# Patient Record
Sex: Male | Born: 1955 | Race: White | Hispanic: No | Smoking: Never smoker
Health system: Southern US, Community
[De-identification: ages and names within clinical notes are randomized; demographics above are authoritative.]

## PROBLEM LIST (undated history)

## (undated) DIAGNOSIS — E785 Hyperlipidemia, unspecified: Secondary | ICD-10-CM

## (undated) DIAGNOSIS — T8859XA Other complications of anesthesia, initial encounter: Secondary | ICD-10-CM

## (undated) DIAGNOSIS — I10 Essential (primary) hypertension: Secondary | ICD-10-CM

## (undated) DIAGNOSIS — N200 Calculus of kidney: Secondary | ICD-10-CM

## (undated) DIAGNOSIS — F32A Depression, unspecified: Secondary | ICD-10-CM

## (undated) DIAGNOSIS — I5189 Other ill-defined heart diseases: Secondary | ICD-10-CM

## (undated) DIAGNOSIS — Z9289 Personal history of other medical treatment: Secondary | ICD-10-CM

## (undated) DIAGNOSIS — Z9981 Dependence on supplemental oxygen: Secondary | ICD-10-CM

## (undated) DIAGNOSIS — C4491 Basal cell carcinoma of skin, unspecified: Secondary | ICD-10-CM

## (undated) DIAGNOSIS — G4733 Obstructive sleep apnea (adult) (pediatric): Secondary | ICD-10-CM

## (undated) DIAGNOSIS — R0789 Other chest pain: Secondary | ICD-10-CM

## (undated) DIAGNOSIS — Z87442 Personal history of urinary calculi: Secondary | ICD-10-CM

## (undated) DIAGNOSIS — F329 Major depressive disorder, single episode, unspecified: Secondary | ICD-10-CM

## (undated) DIAGNOSIS — T4145XA Adverse effect of unspecified anesthetic, initial encounter: Secondary | ICD-10-CM

## (undated) DIAGNOSIS — L0390 Cellulitis, unspecified: Secondary | ICD-10-CM

## (undated) DIAGNOSIS — Z86010 Personal history of colonic polyps: Secondary | ICD-10-CM

## (undated) DIAGNOSIS — R7303 Prediabetes: Secondary | ICD-10-CM

## (undated) HISTORY — DX: Depression, unspecified: F32.A

## (undated) HISTORY — DX: Hyperlipidemia, unspecified: E78.5

## (undated) HISTORY — DX: Other ill-defined heart diseases: I51.89

## (undated) HISTORY — DX: Personal history of colonic polyps: Z86.010

## (undated) HISTORY — PX: LAPAROSCOPY: SHX197

## (undated) HISTORY — DX: Essential (primary) hypertension: I10

## (undated) HISTORY — DX: Obstructive sleep apnea (adult) (pediatric): G47.33

## (undated) HISTORY — DX: Major depressive disorder, single episode, unspecified: F32.9

## (undated) HISTORY — DX: Morbid (severe) obesity due to excess calories: E66.01

## (undated) HISTORY — DX: Other chest pain: R07.89

## (undated) HISTORY — DX: Basal cell carcinoma of skin, unspecified: C44.91

## (undated) HISTORY — PX: KNEE ARTHROSCOPY: SUR90

## (undated) HISTORY — PX: COLONOSCOPY: SHX174

## (undated) HISTORY — PX: ESOPHAGOGASTRODUODENOSCOPY: SHX1529

---
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
## 1991-08-02 HISTORY — PX: CHOLECYSTECTOMY: SHX55

## 1998-08-01 HISTORY — PX: LITHOTRIPSY: SUR834

## 2001-08-13 ENCOUNTER — Encounter: Payer: Self-pay | Admitting: Pulmonary Disease

## 2004-05-14 ENCOUNTER — Ambulatory Visit: Payer: Self-pay

## 2004-05-17 ENCOUNTER — Ambulatory Visit: Payer: Self-pay

## 2004-06-14 ENCOUNTER — Other Ambulatory Visit: Payer: Self-pay

## 2004-06-21 ENCOUNTER — Ambulatory Visit: Payer: Self-pay | Admitting: General Practice

## 2004-07-09 ENCOUNTER — Encounter: Payer: Self-pay | Admitting: General Practice

## 2004-07-15 ENCOUNTER — Ambulatory Visit: Payer: Self-pay

## 2005-01-12 ENCOUNTER — Ambulatory Visit: Payer: Self-pay | Admitting: Psychiatry

## 2006-05-22 ENCOUNTER — Ambulatory Visit: Payer: Self-pay | Admitting: Family Medicine

## 2006-06-07 ENCOUNTER — Emergency Department: Payer: Self-pay | Admitting: Internal Medicine

## 2006-08-10 ENCOUNTER — Ambulatory Visit: Payer: Self-pay | Admitting: Family Medicine

## 2006-08-10 LAB — CONVERTED CEMR LAB
Albumin: 3.4 g/dL — ABNORMAL LOW (ref 3.5–5.2)
BUN: 11 mg/dL (ref 6–23)
CO2: 28 meq/L (ref 19–32)
Chol/HDL Ratio, serum: 4.6
Creatinine, Ser: 1.2 mg/dL (ref 0.4–1.5)
GFR calc non Af Amer: 68 mL/min
Glucose, Bld: 106 mg/dL — ABNORMAL HIGH (ref 70–99)
HDL: 46 mg/dL (ref >39.0)
Total Bilirubin: 0.6 mg/dL (ref 0.3–1.2)
Triglyceride fasting, serum: 130 mg/dL (ref 0–149)
VLDL: 26 mg/dL (ref 0–40)

## 2007-05-19 ENCOUNTER — Encounter: Payer: Self-pay | Admitting: Family Medicine

## 2007-06-12 ENCOUNTER — Telehealth: Payer: Self-pay | Admitting: Family Medicine

## 2007-06-20 ENCOUNTER — Telehealth: Payer: Self-pay | Admitting: Family Medicine

## 2007-06-21 ENCOUNTER — Encounter: Payer: Self-pay | Admitting: Family Medicine

## 2007-06-21 DIAGNOSIS — E1169 Type 2 diabetes mellitus with other specified complication: Secondary | ICD-10-CM | POA: Insufficient documentation

## 2007-06-21 DIAGNOSIS — E66813 Obesity, class 3: Secondary | ICD-10-CM | POA: Insufficient documentation

## 2007-06-21 DIAGNOSIS — F332 Major depressive disorder, recurrent severe without psychotic features: Secondary | ICD-10-CM | POA: Insufficient documentation

## 2007-06-21 DIAGNOSIS — E8881 Metabolic syndrome: Secondary | ICD-10-CM | POA: Insufficient documentation

## 2007-06-21 DIAGNOSIS — Z6841 Body Mass Index (BMI) 40.0 and over, adult: Secondary | ICD-10-CM | POA: Insufficient documentation

## 2007-06-21 DIAGNOSIS — E785 Hyperlipidemia, unspecified: Secondary | ICD-10-CM | POA: Insufficient documentation

## 2007-07-23 ENCOUNTER — Telehealth: Payer: Self-pay | Admitting: Family Medicine

## 2007-08-07 ENCOUNTER — Telehealth (INDEPENDENT_AMBULATORY_CARE_PROVIDER_SITE_OTHER): Payer: Self-pay | Admitting: *Deleted

## 2007-08-30 ENCOUNTER — Ambulatory Visit: Payer: Self-pay | Admitting: Family Medicine

## 2007-09-03 ENCOUNTER — Ambulatory Visit: Payer: Self-pay | Admitting: Internal Medicine

## 2007-09-10 ENCOUNTER — Ambulatory Visit: Payer: Self-pay | Admitting: Family Medicine

## 2007-09-12 ENCOUNTER — Encounter: Payer: Self-pay | Admitting: Family Medicine

## 2007-09-12 ENCOUNTER — Ambulatory Visit: Payer: Self-pay | Admitting: Internal Medicine

## 2007-09-12 ENCOUNTER — Encounter: Payer: Self-pay | Admitting: Internal Medicine

## 2007-09-13 ENCOUNTER — Encounter: Payer: Self-pay | Admitting: Family Medicine

## 2007-09-13 LAB — CONVERTED CEMR LAB
Albumin: 3.5 g/dL (ref 3.5–5.2)
CO2: 29 meq/L (ref 19–32)
Cholesterol: 218 mg/dL (ref 0–200)
Creatinine, Ser: 1.2 mg/dL (ref 0.4–1.5)
Direct LDL: 153.9 mg/dL
HDL: 45.6 mg/dL (ref >39.0)
PSA: 2.8 ng/mL (ref 0.10–4.00)
Potassium: 3.7 meq/L (ref 3.5–5.1)
Sodium: 140 meq/L (ref 135–145)
Total Bilirubin: 0.7 mg/dL (ref 0.3–1.2)
Total Protein: 7 g/dL (ref 6.0–8.3)
Triglycerides: 99 mg/dL (ref 0–149)
VLDL: 20 mg/dL (ref 0–40)

## 2008-01-14 ENCOUNTER — Telehealth: Payer: Self-pay | Admitting: Family Medicine

## 2008-02-11 ENCOUNTER — Ambulatory Visit: Payer: Self-pay | Admitting: Family Medicine

## 2008-02-11 ENCOUNTER — Telehealth: Payer: Self-pay | Admitting: Family Medicine

## 2008-02-14 LAB — CONVERTED CEMR LAB
ALT: 17 units/L (ref 0–53)
AST: 17 units/L (ref 0–37)

## 2008-03-10 ENCOUNTER — Telehealth: Payer: Self-pay | Admitting: Family Medicine

## 2008-04-10 ENCOUNTER — Telehealth: Payer: Self-pay | Admitting: Family Medicine

## 2008-04-14 ENCOUNTER — Encounter: Payer: Self-pay | Admitting: Family Medicine

## 2008-04-23 ENCOUNTER — Telehealth: Payer: Self-pay | Admitting: Family Medicine

## 2008-08-15 ENCOUNTER — Encounter (INDEPENDENT_AMBULATORY_CARE_PROVIDER_SITE_OTHER): Payer: Self-pay | Admitting: *Deleted

## 2008-09-22 ENCOUNTER — Telehealth: Payer: Self-pay | Admitting: Family Medicine

## 2009-01-08 ENCOUNTER — Telehealth: Payer: Self-pay | Admitting: Family Medicine

## 2009-02-04 ENCOUNTER — Telehealth: Payer: Self-pay | Admitting: Family Medicine

## 2009-03-17 ENCOUNTER — Ambulatory Visit: Payer: Self-pay | Admitting: Family Medicine

## 2009-03-18 LAB — CONVERTED CEMR LAB
ALT: 19 units/L (ref 0–53)
AST: 17 units/L (ref 0–37)
Bilirubin, Direct: 0.1 mg/dL (ref 0.0–0.3)
Chloride: 107 meq/L (ref 96–112)
LDL Cholesterol: 99 mg/dL (ref 0–99)
Potassium: 3.8 meq/L (ref 3.5–5.1)
Total Bilirubin: 0.8 mg/dL (ref 0.3–1.2)
Total CHOL/HDL Ratio: 3
Triglycerides: 80 mg/dL (ref 0.0–149.0)

## 2009-03-24 ENCOUNTER — Ambulatory Visit: Payer: Self-pay | Admitting: Family Medicine

## 2009-03-24 LAB — CONVERTED CEMR LAB
Cholesterol, target level: 200 mg/dL
LDL Goal: 130 mg/dL

## 2009-03-26 ENCOUNTER — Telehealth: Payer: Self-pay | Admitting: Family Medicine

## 2009-05-01 ENCOUNTER — Ambulatory Visit: Payer: Self-pay | Admitting: Family Medicine

## 2009-05-01 DIAGNOSIS — G4730 Sleep apnea, unspecified: Secondary | ICD-10-CM

## 2009-05-12 ENCOUNTER — Ambulatory Visit: Payer: Self-pay | Admitting: Pulmonary Disease

## 2009-05-12 DIAGNOSIS — G4733 Obstructive sleep apnea (adult) (pediatric): Secondary | ICD-10-CM | POA: Insufficient documentation

## 2009-05-28 ENCOUNTER — Telehealth: Payer: Self-pay | Admitting: Pulmonary Disease

## 2009-07-07 ENCOUNTER — Ambulatory Visit: Payer: Self-pay | Admitting: Pulmonary Disease

## 2009-08-08 ENCOUNTER — Encounter: Payer: Self-pay | Admitting: Pulmonary Disease

## 2009-09-17 ENCOUNTER — Telehealth: Payer: Self-pay | Admitting: Family Medicine

## 2009-12-30 ENCOUNTER — Encounter: Payer: Self-pay | Admitting: Family Medicine

## 2010-01-06 ENCOUNTER — Telehealth: Payer: Self-pay | Admitting: Pulmonary Disease

## 2010-01-19 ENCOUNTER — Telehealth: Payer: Self-pay | Admitting: Pulmonary Disease

## 2010-01-23 ENCOUNTER — Encounter: Payer: Self-pay | Admitting: Pulmonary Disease

## 2010-01-27 ENCOUNTER — Telehealth: Payer: Self-pay | Admitting: Pulmonary Disease

## 2010-06-08 ENCOUNTER — Telehealth: Payer: Self-pay | Admitting: Family Medicine

## 2010-06-15 ENCOUNTER — Encounter: Payer: Self-pay | Admitting: Family Medicine

## 2010-09-02 NOTE — Progress Notes (Signed)
Summary: nos appt  Phone Note Call from Patient   Caller: [email or web address]  Call For: Blake Garcia Summary of Call: Rsc nos from 6/7 to 6/20 @ 9:45a. Initial call taken by: Darletta Moll,  January 06, 2010 9:45 AM

## 2010-09-02 NOTE — Progress Notes (Signed)
Summary: prior auth needed for diovan  Phone Note From Pharmacy   Caller: [id number] Summary of Call: Prior Berkley Harvey is needed for diovan, form is on your desk. Initial call taken by: Lowella Petties CMA, AAMA,  June 08, 2010 2:46 PM     Appended Document: prior auth needed for diovan Prior auth given for diovan, approval letter placed on doctor's desk for signature and scanning.

## 2010-09-02 NOTE — Letter (Signed)
Summary: CMN for CPAP Supplies/[facility]  CMN for CPAP Supplies/[facility]   Imported By: Sherian Rein 01/27/2010 15:45:17  _____________________________________________________________________  External Attachment:    Type:   Image     Comment:   External Document

## 2010-09-02 NOTE — Progress Notes (Signed)
Summary: nos appt  Phone Note Call from Patient   Caller: [email or web address]  Call For: Saory Carriero Summary of Call: LMTCB x2 to rsc nos from 6/28. Initial call taken by: Darletta Moll,  January 27, 2010 3:57 PM

## 2010-09-02 NOTE — Medication Information (Signed)
Summary: Diovan Approved  Diovan Approved   Imported By: Maryln Gottron 06/25/2010 15:22:26  _____________________________________________________________________  External Attachment:    Type:   Image     Comment:   External Document

## 2010-09-02 NOTE — Progress Notes (Signed)
Summary: refill  Phone Note Refill Request Message from:  [employer] on September 17, 2009 8:56 AM  Refills Requested: Medication #1:  bupropion   Supply Requested: 1 year not on patients medication list   Method Requested: Electronic Initial call taken by: Benny Lennert CMA Duncan Dull),  September 17, 2009 8:56 AM  Follow-up for Phone Call        Denied. Pt no longer on. As of 05/2009 depression well controlled with venlafaxine.  Follow-up by: Kerby Nora MD,  September 17, 2009 10:58 AM

## 2010-09-02 NOTE — Miscellaneous (Signed)
Summary: auto download shows optimal pressure to be 13cm.  Clinical Lists Changes  Orders: Added new Referral order of DME Referral (DME) - Signed auto download shows optimal pressure 13cm.  compliance is about 50% greater or equal to 4 hours will get machine set to 13cm

## 2010-09-02 NOTE — Letter (Signed)
Summary: Letter Regarding Depression Program/[facility]  Letter Regarding Depression Program/[facility]   Imported By: Lanelle Bal 01/12/2010 09:55:55  _____________________________________________________________________  External Attachment:    Type:   Image     Comment:   External Document

## 2010-09-02 NOTE — Progress Notes (Signed)
Summary: nos appt  Phone Note Call from Patient   Caller: [email or web address]  Call For: Shayde Gervacio Summary of Call: Rsc nos from 6/20 to 6/28 @ 9a. Initial call taken by: Darletta Moll,  January 19, 2010 3:00 PM

## 2010-12-17 NOTE — Assessment & Plan Note (Signed)
[facility]                             [facility] OFFICE NOTE   Blake Garcia, Blake Garcia                      MRN:          [id number]  DATE:05/22/2006                            DOB:          Dec 18, 1955    CHIEF COMPLAINT:  A 55 year old white male here to establish new doctor.   HISTORY OF PRESENT ILLNESS:  Blake Garcia was divorced from his previous  wife earlier this year.  He then moved back home to [place] from  [place].  Previously he was seeing a primary care doctor in [place].   1. He comes to the clinic today to be set up with a physician and to      discuss his elevated blood pressure.  He states he ran out of      hydrochlorothiazide and Diovan for the past 2 months.  He had been,      instead, taking his 2 other medications, verapamil 240 mg daily and      lisinopril 10 mg daily.  His blood pressure had been under pretty good      control around 128/88 when he measures it at work.  Prior to running      out of hydrochlorothiazide and Diovan, those were the 2 medications he      was really taking.  He was previously on 4 medications, but lost 22      pounds, and so his blood pressure got under better control with just 2      medications, but he never made it back to his primary care doctor to      let them know, but instead stopped the other 2 medications on his own.   1. Depression:  This manifested itself since the divorce with irritability      and snapping verbally at his children.  He has been seeing a      psychiatrist, Dr. Manson Passey, in [place].  This doctor began him on      Wellbutrin 200 mg b.i.d.  He states he just takes 1 tablet in the      morning, but this does very well for him and controls his mood      throughout the day, and by the time it is evening, he has less stress      and less irritability, and therefore, does not take the second dose of      the day.   PAST MEDICAL HISTORY:  1. Hypertension.  2.  Depression.   HOSPITALIZATIONS, SURGERIES, PROCEDURES:  1. 1993 cholecystectomy, open.  2. 2000 kidney stone lithotripsy and stents.  3. 1993 EGD, negative.  4. 1993 colonoscopy, negative.  5. 2005 arthroscopy for torn meniscus.   ALLERGIES:  None.   MEDICATIONS:  1. Verapamil 240 mg p.o. daily.  2. Wellbutrin 200 mg daily.  3. Klor-Con 20 mg p.o. b.i.d.  4. Lisinopril 10 mg daily.  5. Hydrochlorothiazide 25 mg daily.  6. Diovan 160/12.5 mg daily.  7. Viagra 50 mg p.o. q. p.r.n.   FAMILY HISTORY:  Father deceased at age 52 with CABG and massive stroke  during valve repair surgery.  Mother alive at age 56 with hypertension and  pacemaker.  He has 2 brothers, 1 with coronary artery disease resulting in  CABG and stent.  They both have back problems.  One of his brothers does  have diabetes.  He has 2 maternal aunts with breast cancer.  He has 1  maternal uncle with lung cancer.   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  He is divorced  recently within the last year.  He has 3 children age ranged 5 to 63 whom he  sees every other weekend.  He walks about 1 time per week.  He eats about 2  meals per day and frequently skips lunch or dinner.  He usually eats cereal  and salads.  He does drink a lot of soda, Diet Cheerwine.   REVIEW OF SYSTEMS:  No headache.  He wears glasses.  No hearing problems.  No dyspnea.  No chest pain.  No palpitations.  No nausea, vomiting,  diarrhea, constipation, or rectal bleeding.   PHYSICAL EXAM:  VITAL SIGNS:  Height 69 inches.  Weight 318, making BMI  above 40.  Blood pressure 142/104, pulse 80, temperature 98.5.  GENERAL:  Morbidly obese male in no apparent distress.  HEENT:  PERRLA.  Extraocular muscles are intact.  Oropharynx clear.  Nares  clear.  Tympanic membranes clear.  No thyromegaly.  No lymphadenopathy  supraclavicular or cervical.  CARDIOVASCULAR:  Regular rate and rhythm.  No murmurs, rubs, or gallops.  Normal PMI, 2+  peripheral pulses.  No peripheral edema.  LUNGS:  Clear to auscultation bilaterally.  No wheeze, rales, or rhonchi.  ABDOMEN:  Soft and nontender.  Normoactive bowel sounds.  No  hepatosplenomegaly.  MUSCULOSKELETAL:  5/5 strength.  NEURO:  Alert and oriented x3.  Cranial nerves 2-12 are intact.  Reflexes,  patellar, within normal limits.   ASSESSMENT AND PLAN:  1. Hypertension, moderate control:  Mr. Fadden states that he rushed      here today and had a stressful event regarding his children earlier      this morning, and he feels that is why his blood pressure is higher      than it normally is.  We discussed different medications and I do not      think more than 25 mg of hydrochlorothiazide really is very effective.      We also discussed making his medications more affordable.  We will      change him to hydrochlorothiazide 25 mg daily and lisinopril 10 mg      daily.  He was given prescription for the hydrochlorothiazide 6      refills.  He will monitor his blood pressure at home and let me know if      it is above the goal 140/90.  He will likely also need a basic      metabolic profile once I obtain his records from his previous doctor.      We discussed low-salt diet and increasing exercise, which he will work      on, as well as losing weight.  2. Prevention:  He is overdue for a cholesterol screen.  We should likely      re-screen him for      diabetes given his family history and his morbid obesity.  He is up to      date with his tetanus shot, but could receive the flu vaccine, which we  will discuss at the next visit.      Kerby Nora, MD    AB/MedQ  DD:  05/22/2006  DT:  05/22/2006  Job #:  [id number]

## 2011-02-03 ENCOUNTER — Other Ambulatory Visit: Payer: Self-pay | Admitting: Family Medicine

## 2011-02-03 NOTE — Telephone Encounter (Signed)
Patient has not had cpx since 2010

## 2011-02-04 NOTE — Telephone Encounter (Signed)
I refilled once but please have pt make appt to be seen prior to any further refills.

## 2011-02-04 NOTE — Telephone Encounter (Signed)
Patient advised via message on machine that he will need appt for physical before 90 day refill finished b/c he can't have further refills with out appt

## 2011-02-15 ENCOUNTER — Inpatient Hospital Stay: Payer: Self-pay | Admitting: Internal Medicine

## 2011-02-21 ENCOUNTER — Other Ambulatory Visit (INDEPENDENT_AMBULATORY_CARE_PROVIDER_SITE_OTHER): Payer: BC Managed Care – PPO | Admitting: Family Medicine

## 2011-02-21 ENCOUNTER — Telehealth: Payer: Self-pay | Admitting: Family Medicine

## 2011-02-21 DIAGNOSIS — R7309 Other abnormal glucose: Secondary | ICD-10-CM

## 2011-02-21 DIAGNOSIS — E785 Hyperlipidemia, unspecified: Secondary | ICD-10-CM

## 2011-02-21 DIAGNOSIS — Z125 Encounter for screening for malignant neoplasm of prostate: Secondary | ICD-10-CM

## 2011-02-21 DIAGNOSIS — I10 Essential (primary) hypertension: Secondary | ICD-10-CM

## 2011-02-21 LAB — LIPID PANEL
Cholesterol: 153 mg/dL (ref 0–200)
HDL: 49.9 mg/dL (ref >39.00)
LDL Cholesterol: 79 mg/dL (ref 0–99)
VLDL: 24 mg/dL (ref 0.0–40.0)

## 2011-02-21 LAB — COMPREHENSIVE METABOLIC PANEL
Alkaline Phosphatase: 75 U/L (ref 39–117)
Creatinine, Ser: 1 mg/dL (ref 0.4–1.5)
Glucose, Bld: 105 mg/dL — ABNORMAL HIGH (ref 70–99)
Sodium: 136 mEq/L (ref 135–145)
Total Bilirubin: 0.4 mg/dL (ref 0.3–1.2)
Total Protein: 6.8 g/dL (ref 6.0–8.3)

## 2011-02-21 NOTE — Telephone Encounter (Signed)
Message copied by Excell Seltzer on Mon Feb 21, 2011  9:51 AM ------      Message from: Alvina Chou      Created: Mon Feb 21, 2011  8:32 AM       Patient is scheduled for CPX labs today, please order future labs, Thanks , Blake Garcia

## 2011-02-22 ENCOUNTER — Encounter: Payer: Self-pay | Admitting: Family Medicine

## 2011-02-24 ENCOUNTER — Ambulatory Visit (INDEPENDENT_AMBULATORY_CARE_PROVIDER_SITE_OTHER): Payer: BC Managed Care – PPO | Admitting: Family Medicine

## 2011-02-24 ENCOUNTER — Encounter: Payer: Self-pay | Admitting: Family Medicine

## 2011-02-24 DIAGNOSIS — R7309 Other abnormal glucose: Secondary | ICD-10-CM

## 2011-02-24 DIAGNOSIS — E785 Hyperlipidemia, unspecified: Secondary | ICD-10-CM

## 2011-02-24 DIAGNOSIS — F329 Major depressive disorder, single episode, unspecified: Secondary | ICD-10-CM

## 2011-02-24 DIAGNOSIS — A630 Anogenital (venereal) warts: Secondary | ICD-10-CM | POA: Insufficient documentation

## 2011-02-24 DIAGNOSIS — R972 Elevated prostate specific antigen [PSA]: Secondary | ICD-10-CM | POA: Insufficient documentation

## 2011-02-24 DIAGNOSIS — I10 Essential (primary) hypertension: Secondary | ICD-10-CM

## 2011-02-24 DIAGNOSIS — K921 Melena: Secondary | ICD-10-CM | POA: Insufficient documentation

## 2011-02-24 LAB — TSH: TSH: 2.03 u[IU]/mL (ref 0.35–5.50)

## 2011-02-24 MED ORDER — AMLODIPINE BESYLATE 5 MG PO TABS: 5.0000 mg | ORAL_TABLET | Freq: Every day | ORAL | Status: DC

## 2011-02-24 MED ORDER — VALSARTAN-HYDROCHLOROTHIAZIDE 320-25 MG PO TABS: 1.0000 | ORAL_TABLET | Freq: Every day | ORAL | Status: DC

## 2011-02-24 MED ORDER — POTASSIUM CHLORIDE CRYS ER 20 MEQ PO TBCR: 20.0000 meq | EXTENDED_RELEASE_TABLET | Freq: Every day | ORAL | Status: DC

## 2011-02-24 MED ORDER — SIMVASTATIN 40 MG PO TABS: 40.0000 mg | ORAL_TABLET | Freq: Every day | ORAL | Status: DC

## 2011-02-24 MED ORDER — VENLAFAXINE HCL ER 75 MG PO CP24: 75.0000 mg | ORAL_CAPSULE | Freq: Every day | ORAL | Status: DC

## 2011-02-24 NOTE — Assessment & Plan Note (Signed)
Slight improvement  In glucose. Encouraged exercise, weight loss, healthy eating habits.

## 2011-02-24 NOTE — Progress Notes (Signed)
  Subjective:    Patient ID: Blake Garcia, male    DOB: [birth year], 55 y.o.   MRN: [id number]  HPI  The patient is here for annual wellness exam and preventative care.    Hypertension:   moderate control on diovan, elevated in hospital so amlodipine added. Using medication without problems or lightheadedness:  Chest pain with exertion: none Edema:None Short of breath:None Average home BPs: Since hosp D/C 140/88 Other issues:  Elevated Cholesterol: On simvastatin, well controlled Using medications without problems:None Muscle aches: None Other complaints:  He was also admitted to [facility] on 7/17 to 7/20 for severe headache, fever, tachy,  Nml head CT, unable to get spinal fluid for lumbar puncture Blood cultures negative. Dx with UTI. RMSF and LYME test neg.  Started on IV fluids, vancomycin, meropenem, IV dexamethasone.  Continued on oral Levaquin for 5 days. Last day yesterday. Dx ith systemic inflammatory respone  with UTI, orossible meningitis per Neuro consult  Since at home.. Feeling much better, no further fever, no headache, does have mild neck pain. No dysuria or blood in urine.  He has continued to have weight gain.. Now is over 400lbs. Minimal exercise. Poor diet but ready to work on this.   Review of Systems Does not some urinary urgency. Has occ noted coffee ground color of stool.    Objective:   Physical Exam        Assessment & Plan:  Complete Physical Exam: .aeb

## 2011-02-24 NOTE — Assessment & Plan Note (Signed)
Possible father with prostate issues not sure if cancer.  Will recheck PSA to verify with free and total. May need uro referral.

## 2011-02-24 NOTE — Assessment & Plan Note (Addendum)
Hemeoccult today: Negative. Not sure if true melena.  Sent home with stool cards to check stool for blood. No epigastric pain, no GERD.

## 2011-02-24 NOTE — Patient Instructions (Addendum)
Continue to follow BP if remaining equal to or above 140/90 or less than 90/60 for the next 2 weeks... Call the office.  We will call with PSA results.  Please return stool cards to lab.  Start regualr exercise. Eat three meals a day, don't skip meals.  Stop fast food.  Increase water in diet.

## 2011-02-24 NOTE — Assessment & Plan Note (Signed)
Well controlled on effexor  

## 2011-02-24 NOTE — Assessment & Plan Note (Signed)
Borderline control, if BPs remain high we will need to increase amlodipine.

## 2011-02-25 ENCOUNTER — Encounter: Payer: Self-pay | Admitting: Family Medicine

## 2011-02-25 LAB — PSA, TOTAL AND FREE
PSA, Free: <0.01 ng/mL
PSA: 4.19 ng/mL — ABNORMAL HIGH (ref <=4.00)

## 2011-02-26 ENCOUNTER — Other Ambulatory Visit: Payer: Self-pay | Admitting: Family Medicine

## 2011-04-01 ENCOUNTER — Ambulatory Visit: Payer: BC Managed Care – PPO | Admitting: Family Medicine

## 2011-04-14 ENCOUNTER — Telehealth: Payer: Self-pay | Admitting: *Deleted

## 2011-04-14 NOTE — Telephone Encounter (Signed)
Pt is coming in tomorrow to have cryotherapy on warts and he is asking if he should stop his asa prior, to avoid bleeding.  Left message on voice mail advising pt that there's no need for him to stop his aspirin.

## 2011-04-15 ENCOUNTER — Ambulatory Visit (INDEPENDENT_AMBULATORY_CARE_PROVIDER_SITE_OTHER): Payer: BC Managed Care – PPO | Admitting: Family Medicine

## 2011-04-15 ENCOUNTER — Encounter: Payer: Self-pay | Admitting: Family Medicine

## 2011-04-15 DIAGNOSIS — A630 Anogenital (venereal) warts: Secondary | ICD-10-CM

## 2011-04-15 DIAGNOSIS — R972 Elevated prostate specific antigen [PSA]: Secondary | ICD-10-CM

## 2011-04-15 DIAGNOSIS — I10 Essential (primary) hypertension: Secondary | ICD-10-CM

## 2011-04-15 NOTE — Progress Notes (Signed)
  Subjective:    Patient ID: Blake Garcia, male    DOB: September 26, 1955, 55 y.o.   MRN: [id number]  HPI  55 year old male presents for cyotherapy of genital warts.  HTN, improved control since last check, running well at home.  Continuing to work on weight loss, has gained one pound. He has not started walking yet, has cut out fries, small meals each day.  ? Melena: no further cahnge in stool color. He has not returned stool cards.  Saw urology....for elevated PSA.[name] Returned to normal... Plans recheck in 3 months.   Review of Systems  Constitutional: Negative for fever and fatigue.  HENT: Negative for ear pain.   Respiratory: Negative for cough and shortness of breath.   Cardiovascular: Negative for chest pain.  Genitourinary: Negative for dysuria and flank pain.  Psychiatric/Behavioral: Negative for dysphoric mood.       Objective:   Physical Exam  Constitutional: Vital signs are normal. He appears well-developed and well-nourished.  HENT:  Head: Normocephalic.  Right Ear: Hearing normal.  Left Ear: Hearing normal.  Nose: Nose normal.  Mouth/Throat: Oropharynx is clear and moist and mucous membranes are normal.  Neck: Trachea normal. Carotid bruit is not present. No mass and no thyromegaly present.  Cardiovascular: Normal rate, regular rhythm and normal pulses.  Exam reveals no gallop, no distant heart sounds and no friction rub.   No murmur heard.      No peripheral edema  Pulmonary/Chest: Effort normal and breath sounds normal. No respiratory distress.  Genitourinary:    Uncircumcised. No phimosis, paraphimosis, hypospadias or penile erythema. No discharge found.       2 warts on penile shaft   Skin: Skin is warm, dry and intact. No rash noted.  Psychiatric: He has a normal mood and affect. His speech is normal and behavior is normal. Thought content normal.          Assessment & Plan:

## 2011-04-15 NOTE — Assessment & Plan Note (Signed)
Likely due to recent infection.[name] Resolved now. Followed by uro.

## 2011-04-15 NOTE — Assessment & Plan Note (Signed)
Area treated with cryotherapy 3 cycles with 2-3 mm halo.  Discussed expected recovery and return if  Recurrence.

## 2011-04-15 NOTE — Assessment & Plan Note (Signed)
Improved control. 

## 2011-08-11 ENCOUNTER — Encounter (INDEPENDENT_AMBULATORY_CARE_PROVIDER_SITE_OTHER): Payer: 59 | Admitting: Family Medicine

## 2011-08-12 NOTE — Progress Notes (Signed)
  Subjective:    Patient ID: Blake Garcia, male    DOB: November 21, 1955, 56 y.o.   MRN: [id number]  HPI Wrong patient.   Review of Systems     Objective:   Physical Exam        Assessment & Plan:

## 2011-11-10 ENCOUNTER — Ambulatory Visit: Payer: BC Managed Care – PPO | Admitting: Family Medicine

## 2011-12-01 ENCOUNTER — Ambulatory Visit: Payer: BC Managed Care – PPO | Admitting: Family Medicine

## 2011-12-02 ENCOUNTER — Ambulatory Visit: Payer: BC Managed Care – PPO | Admitting: Family Medicine

## 2011-12-23 ENCOUNTER — Ambulatory Visit: Payer: Self-pay | Admitting: Family Medicine

## 2012-02-07 ENCOUNTER — Ambulatory Visit (INDEPENDENT_AMBULATORY_CARE_PROVIDER_SITE_OTHER): Payer: BC Managed Care – PPO | Admitting: Family Medicine

## 2012-02-07 ENCOUNTER — Telehealth: Payer: Self-pay | Admitting: Family Medicine

## 2012-02-07 ENCOUNTER — Encounter: Payer: Self-pay | Admitting: Family Medicine

## 2012-02-07 VITALS — BP 130/84 | HR 108 | Temp 98.8°F | Ht 67.0 in | Wt >= 6400 oz

## 2012-02-07 DIAGNOSIS — L02419 Cutaneous abscess of limb, unspecified: Secondary | ICD-10-CM

## 2012-02-07 DIAGNOSIS — R300 Dysuria: Secondary | ICD-10-CM

## 2012-02-07 DIAGNOSIS — L03119 Cellulitis of unspecified part of limb: Secondary | ICD-10-CM

## 2012-02-07 DIAGNOSIS — R82998 Other abnormal findings in urine: Secondary | ICD-10-CM

## 2012-02-07 DIAGNOSIS — L03115 Cellulitis of right lower limb: Secondary | ICD-10-CM

## 2012-02-07 DIAGNOSIS — R8290 Unspecified abnormal findings in urine: Secondary | ICD-10-CM | POA: Insufficient documentation

## 2012-02-07 LAB — POCT URINALYSIS DIPSTICK
Glucose, UA: NEGATIVE
Ketones, UA: NEGATIVE

## 2012-02-07 LAB — POCT UA - MICROSCOPIC ONLY

## 2012-02-07 MED ORDER — SULFAMETHOXAZOLE-TMP DS 800-160 MG PO TABS: 2.0000 | ORAL_TABLET | Freq: Two times a day (BID) | ORAL | Status: DC

## 2012-02-07 NOTE — Patient Instructions (Addendum)
Start sulfa/tmp to cover for MRSA cellulitis (this will also cover urine most likely). Follow up in 1-2 days for re-eval with Dr. Patsy Lager. Push fluids, tylenol for fever. If fever continuing or redness spreading on antibiotics.. Call or go to ER for IV antibiotics.

## 2012-02-07 NOTE — Telephone Encounter (Signed)
Caller: Nova/Patient; PCP: Kerby Nora E.; CB#: [phone]; Call regarding Fever Onset 02/03/12; Urinary sx.  On Ibuprofen for fever; temp as high as 102 oral. Emergent sx ruled out.  See in 4 hours per Urinary Sx protocol.  Appt. w/ Dr. Ermalene Searing at 0930.

## 2012-02-07 NOTE — Assessment & Plan Note (Addendum)
Entry through open skin from eczema. 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 Start sulfa/tmp. Follow up in 1-2 days for re-eval. Push fluids, antipyretic. If fever or redness spreading on antibiotics.. Call/go to ER for IV antibiotics.

## 2012-02-07 NOTE — Progress Notes (Signed)
  Subjective:    Patient ID: Blake Garcia, male    DOB: May 08, 1956, 56 y.o.   MRN: [id number]  Urinary Tract Infection  This is a new problem. The current episode started in the past 7 days (4 days ago). The problem occurs every urination. The problem has been gradually worsening. Quality: urine odor, no pain with urinating. The patient is experiencing no pain. The maximum temperature recorded prior to his arrival was 102 - 102.9 F. He is not sexually active. There is a history of pyelonephritis. Associated symptoms include chills and urgency. Pertinent negatives include no frequency. He has tried acetaminophen for the symptoms. The treatment provided mild relief. There is no history of catheterization, kidney stones, recurrent UTIs, a single kidney, urinary stasis or a urological procedure. 1 year ago.. he was hospitalized with pyelonephritits, holds urine a lot   Has severe eczema on legs, has opening on skin. Using cream for this After work yesterday... He is on feet all day...wears compression hose. Has noted redness and warmth in right leg. Feels ache in right calf. Has not been sitting more recently, drove 4 hours last week, but stopped every hour.  No nausea and vomiting.   Review of Systems  Constitutional: Positive for chills.  HENT: Negative for ear pain.   Eyes: Negative for pain.  Respiratory: Negative for shortness of breath.   Cardiovascular: Positive for leg swelling. Negative for chest pain and palpitations.  Genitourinary: Positive for urgency. Negative for frequency.  Psychiatric/Behavioral: Negative for confusion.       Objective:   Physical Exam  Constitutional: Vital signs are normal. He appears well-developed and well-nourished.  HENT:  Head: Normocephalic.  Right Ear: Hearing normal.  Left Ear: Hearing normal.  Nose: Nose normal.  Mouth/Throat: Oropharynx is clear and moist and mucous membranes are normal.  Neck: Trachea normal. Carotid bruit is not present.  No mass and no thyromegaly present.  Cardiovascular: Normal rate, regular rhythm and normal pulses.  Exam reveals no gallop, no distant heart sounds and no friction rub.   No murmur heard.      No peripheral edema  Pulmonary/Chest: Effort normal and breath sounds normal. No respiratory distress.  Skin: Skin is warm, dry and intact. Rash noted.       rigth anterior and posterior calf with erythema, warmth to below knee from ankle.  eczema severe, dry flaky and open wound from excoriation at ankle , mild bleeding  no pustule, no abcess seen, no odor to wound. Ink used to Caitlin edge of erythema.  Psychiatric: He has a normal mood and affect. His speech is normal and behavior is normal. Thought content normal.          Assessment & Plan:

## 2012-02-07 NOTE — Telephone Encounter (Signed)
Agreed -

## 2012-02-07 NOTE — Assessment & Plan Note (Signed)
Urine contaminated with skin cells. Not clearly UTI.. Will send for culture.

## 2012-02-09 ENCOUNTER — Encounter: Payer: Self-pay | Admitting: Family Medicine

## 2012-02-09 ENCOUNTER — Inpatient Hospital Stay (HOSPITAL_COMMUNITY)
Admission: AD | Admit: 2012-02-09 | Discharge: 2012-02-21 | DRG: 563 | Disposition: A | Discharge status: Home / Self Care | Payer: BC Managed Care – PPO | Source: Ambulatory Visit | Attending: Internal Medicine | Admitting: Internal Medicine

## 2012-02-09 ENCOUNTER — Encounter (HOSPITAL_COMMUNITY): Payer: Self-pay | Admitting: General Practice

## 2012-02-09 ENCOUNTER — Ambulatory Visit (INDEPENDENT_AMBULATORY_CARE_PROVIDER_SITE_OTHER): Payer: BC Managed Care – PPO | Admitting: Family Medicine

## 2012-02-09 ENCOUNTER — Inpatient Hospital Stay (HOSPITAL_COMMUNITY): Payer: BC Managed Care – PPO

## 2012-02-09 ENCOUNTER — Telehealth: Payer: Self-pay | Admitting: Internal Medicine

## 2012-02-09 VITALS — BP 140/68 | HR 109 | Temp 99.0°F | Wt >= 6400 oz

## 2012-02-09 DIAGNOSIS — Z6841 Body Mass Index (BMI) 40.0 and over, adult: Secondary | ICD-10-CM | POA: Diagnosis present

## 2012-02-09 DIAGNOSIS — L03119 Cellulitis of unspecified part of limb: Secondary | ICD-10-CM

## 2012-02-09 DIAGNOSIS — L02419 Cutaneous abscess of limb, unspecified: Principal | ICD-10-CM | POA: Diagnosis present

## 2012-02-09 DIAGNOSIS — R8290 Unspecified abnormal findings in urine: Secondary | ICD-10-CM

## 2012-02-09 DIAGNOSIS — G4733 Obstructive sleep apnea (adult) (pediatric): Secondary | ICD-10-CM | POA: Diagnosis present

## 2012-02-09 DIAGNOSIS — I5033 Acute on chronic diastolic (congestive) heart failure: Secondary | ICD-10-CM | POA: Diagnosis present

## 2012-02-09 DIAGNOSIS — E871 Hypo-osmolality and hyponatremia: Secondary | ICD-10-CM | POA: Diagnosis present

## 2012-02-09 DIAGNOSIS — E8881 Metabolic syndrome: Secondary | ICD-10-CM

## 2012-02-09 DIAGNOSIS — L03115 Cellulitis of right lower limb: Secondary | ICD-10-CM

## 2012-02-09 DIAGNOSIS — Z79899 Other long term (current) drug therapy: Secondary | ICD-10-CM

## 2012-02-09 DIAGNOSIS — I10 Essential (primary) hypertension: Secondary | ICD-10-CM | POA: Diagnosis present

## 2012-02-09 DIAGNOSIS — G4730 Sleep apnea, unspecified: Secondary | ICD-10-CM | POA: Diagnosis present

## 2012-02-09 DIAGNOSIS — R7309 Other abnormal glucose: Secondary | ICD-10-CM

## 2012-02-09 DIAGNOSIS — Z7982 Long term (current) use of aspirin: Secondary | ICD-10-CM

## 2012-02-09 DIAGNOSIS — C4491 Basal cell carcinoma of skin, unspecified: Secondary | ICD-10-CM | POA: Diagnosis present

## 2012-02-09 DIAGNOSIS — E876 Hypokalemia: Secondary | ICD-10-CM | POA: Diagnosis present

## 2012-02-09 DIAGNOSIS — A630 Anogenital (venereal) warts: Secondary | ICD-10-CM

## 2012-02-09 DIAGNOSIS — I5030 Unspecified diastolic (congestive) heart failure: Secondary | ICD-10-CM | POA: Diagnosis present

## 2012-02-09 DIAGNOSIS — F3289 Other specified depressive episodes: Secondary | ICD-10-CM | POA: Diagnosis present

## 2012-02-09 DIAGNOSIS — I872 Venous insufficiency (chronic) (peripheral): Secondary | ICD-10-CM | POA: Diagnosis present

## 2012-02-09 DIAGNOSIS — F329 Major depressive disorder, single episode, unspecified: Secondary | ICD-10-CM | POA: Diagnosis present

## 2012-02-09 DIAGNOSIS — E66813 Obesity, class 3: Secondary | ICD-10-CM | POA: Diagnosis present

## 2012-02-09 DIAGNOSIS — E785 Hyperlipidemia, unspecified: Secondary | ICD-10-CM | POA: Diagnosis present

## 2012-02-09 DIAGNOSIS — R972 Elevated prostate specific antigen [PSA]: Secondary | ICD-10-CM

## 2012-02-09 DIAGNOSIS — D72829 Elevated white blood cell count, unspecified: Secondary | ICD-10-CM | POA: Diagnosis present

## 2012-02-09 DIAGNOSIS — L0390 Cellulitis, unspecified: Secondary | ICD-10-CM

## 2012-02-09 DIAGNOSIS — E1169 Type 2 diabetes mellitus with other specified complication: Secondary | ICD-10-CM | POA: Diagnosis present

## 2012-02-09 HISTORY — DX: Personal history of other medical treatment: Z92.89

## 2012-02-09 HISTORY — DX: Other complications of anesthesia, initial encounter: T88.59XA

## 2012-02-09 HISTORY — DX: Cellulitis, unspecified: L03.90

## 2012-02-09 HISTORY — DX: Calculus of kidney: N20.0

## 2012-02-09 HISTORY — DX: Adverse effect of unspecified anesthetic, initial encounter: T41.45XA

## 2012-02-09 LAB — CBC
HCT: 38.7 % — ABNORMAL LOW (ref 39.0–52.0)
Hemoglobin: 13.6 g/dL (ref 13.0–17.0)
MCV: 85.6 fL (ref 78.0–100.0)
RBC: 4.52 MIL/uL (ref 4.22–5.81)
WBC: 25.1 10*3/uL — ABNORMAL HIGH (ref 4.0–10.5)

## 2012-02-09 LAB — URINALYSIS, ROUTINE W REFLEX MICROSCOPIC
Glucose, UA: NEGATIVE mg/dL
pH: 5 (ref 5.0–8.0)

## 2012-02-09 LAB — PHOSPHORUS: Phosphorus: 2.7 mg/dL (ref 2.3–4.6)

## 2012-02-09 LAB — DIFFERENTIAL
Basophils Absolute: 0 10*3/uL (ref 0.0–0.1)
Basophils Relative: 0 % (ref 0–1)
Eosinophils Relative: 0 % (ref 0–5)
Monocytes Absolute: 1.7 10*3/uL — ABNORMAL HIGH (ref 0.1–1.0)
Neutro Abs: 22.3 10*3/uL — ABNORMAL HIGH (ref 1.7–7.7)

## 2012-02-09 LAB — COMPREHENSIVE METABOLIC PANEL
Alkaline Phosphatase: 100 U/L (ref 39–117)
BUN: 32 mg/dL — ABNORMAL HIGH (ref 6–23)
Creatinine, Ser: 2.64 mg/dL — ABNORMAL HIGH (ref 0.50–1.35)
GFR calc Af Amer: 30 mL/min — ABNORMAL LOW (ref >90)
Glucose, Bld: 116 mg/dL — ABNORMAL HIGH (ref 70–99)
Potassium: 3.1 mEq/L — ABNORMAL LOW (ref 3.5–5.1)
Total Bilirubin: 0.7 mg/dL (ref 0.3–1.2)
Total Protein: 7.2 g/dL (ref 6.0–8.3)

## 2012-02-09 LAB — MAGNESIUM: Magnesium: 2.3 mg/dL (ref 1.5–2.5)

## 2012-02-09 LAB — URINE CULTURE: Colony Count: NO GROWTH

## 2012-02-09 LAB — URINE MICROSCOPIC-ADD ON

## 2012-02-09 MED ORDER — SODIUM CHLORIDE 0.9 % IV SOLN
3.0000 g | Freq: Four times a day (QID) | INTRAVENOUS | Status: DC
  Administered 2012-02-09 – 2012-02-12 (×11): 3 g via INTRAVENOUS
  Filled 2012-02-09 (×13): qty 3

## 2012-02-09 MED ORDER — SIMVASTATIN 40 MG PO TABS
40.0000 mg | ORAL_TABLET | Freq: Every day | ORAL | Status: DC
Start: 2012-02-09 — End: 2012-02-21
  Administered 2012-02-09 – 2012-02-20 (×12): 40 mg via ORAL
  Filled 2012-02-09 (×13): qty 1

## 2012-02-09 MED ORDER — VANCOMYCIN HCL 1000 MG IV SOLR
2500.0000 mg | Freq: Once | INTRAVENOUS | Status: DC
  Filled 2012-02-09: qty 2500

## 2012-02-09 MED ORDER — VENLAFAXINE HCL ER 75 MG PO CP24
75.0000 mg | ORAL_CAPSULE | Freq: Every day | ORAL | Status: DC
  Administered 2012-02-10 – 2012-02-21 (×12): 75 mg via ORAL
  Filled 2012-02-09 (×13): qty 1

## 2012-02-09 MED ORDER — ONDANSETRON HCL 4 MG PO TABS: 4.0000 mg | ORAL_TABLET | Freq: Four times a day (QID) | ORAL | Status: DC | PRN

## 2012-02-09 MED ORDER — ENOXAPARIN SODIUM 40 MG/0.4ML ~~LOC~~ SOLN
40.0000 mg | SUBCUTANEOUS | Status: DC
  Administered 2012-02-09 – 2012-02-12 (×4): 40 mg via SUBCUTANEOUS
  Filled 2012-02-09 (×4): qty 0.4

## 2012-02-09 MED ORDER — SODIUM CHLORIDE 0.9 % IJ SOLN: 3.0000 mL | INTRAMUSCULAR | Status: DC | PRN

## 2012-02-09 MED ORDER — ONDANSETRON HCL 4 MG/2ML IJ SOLN: 4.0000 mg | Freq: Four times a day (QID) | INTRAMUSCULAR | Status: DC | PRN

## 2012-02-09 MED ORDER — ASPIRIN EC 81 MG PO TBEC
81.0000 mg | DELAYED_RELEASE_TABLET | Freq: Every day | ORAL | Status: DC
  Administered 2012-02-10 – 2012-02-21 (×12): 81 mg via ORAL
  Filled 2012-02-09 (×12): qty 1

## 2012-02-09 MED ORDER — IRBESARTAN 300 MG PO TABS
300.0000 mg | ORAL_TABLET | Freq: Every day | ORAL | Status: DC
  Filled 2012-02-09: qty 1

## 2012-02-09 MED ORDER — AMLODIPINE BESYLATE 5 MG PO TABS
5.0000 mg | ORAL_TABLET | Freq: Every day | ORAL | Status: DC
  Filled 2012-02-09: qty 1

## 2012-02-09 MED ORDER — ACETAMINOPHEN 325 MG PO TABS
650.0000 mg | ORAL_TABLET | Freq: Four times a day (QID) | ORAL | Status: DC | PRN
  Administered 2012-02-09 – 2012-02-20 (×7): 650 mg via ORAL
  Filled 2012-02-09 (×10): qty 2

## 2012-02-09 MED ORDER — VALSARTAN-HYDROCHLOROTHIAZIDE 320-25 MG PO TABS: 1.0000 | ORAL_TABLET | Freq: Every day | ORAL | Status: DC

## 2012-02-09 MED ORDER — SODIUM CHLORIDE 0.9 % IJ SOLN: 3.0000 mL | Freq: Two times a day (BID) | INTRAMUSCULAR | Status: DC

## 2012-02-09 MED ORDER — VANCOMYCIN HCL 1000 MG IV SOLR
2000.0000 mg | INTRAVENOUS | Status: DC
  Filled 2012-02-09: qty 2000

## 2012-02-09 MED ORDER — OXYCODONE HCL 5 MG PO TABS
5.0000 mg | ORAL_TABLET | ORAL | Status: DC | PRN
  Administered 2012-02-10 – 2012-02-14 (×7): 5 mg via ORAL
  Filled 2012-02-09 (×8): qty 1

## 2012-02-09 MED ORDER — ACETAMINOPHEN 650 MG RE SUPP: 650.0000 mg | Freq: Four times a day (QID) | RECTAL | Status: DC | PRN

## 2012-02-09 MED ORDER — SODIUM CHLORIDE 0.9 % IV SOLN: 250.0000 mL | INTRAVENOUS | Status: DC | PRN

## 2012-02-09 MED ORDER — HYDROCHLOROTHIAZIDE 25 MG PO TABS
25.0000 mg | ORAL_TABLET | Freq: Every day | ORAL | Status: DC
  Filled 2012-02-09: qty 1

## 2012-02-09 NOTE — Progress Notes (Signed)
Nature conservation officer at [facility] [address] [place] [address] Phone: [phone] Fax: [phone]  Date:  02/09/2012   Name:  Blake Garcia   DOB:  Mar 14, 1956   MRN:  [id number]  PCP:  Blake Nora, MD    Chief Complaint: Follow-up   History of Present Illness:  Blake Garcia is a 56 y.o. very pleasant male patient who presents with the following:  101 fever Tmax yesterday.   Pleasant gentleman with a weight of 430 pounds who presents in followup after initially being seen on 02/07/2012,, partner Dr. Ermalene Garcia, and was placed on some Bactrim DS, 2 tablets by mouth twice a day. This was for some right-sided lower extremity cellulitis, and today he is markedly worse. He has extensive redness throughout his entirety of his right leg, and some streaking adjacent and around the knee. It is spread beyond the borders that were marked 2 days prior.  His MAXIMUM TEMPERATURE from a few days ago was 103. He is been dosing himself with Tylenol and anti-inflammatories since that time, but is still remained and had some fever.  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  Baseline complication includes some underlying eczema as well as some venous stasis disease.  He had a question of some dysuria last office visit, but his urine culture was negative.  Patient Active Problem List  Diagnosis  . HYPERLIPIDEMIA  . METABOLIC SYNDROME X  . MORBID OBESITY  . DEPRESSION  . OBSTRUCTIVE SLEEP APNEA  . HYPERTENSION  . SLEEP APNEA  . PREDIABETES  . Elevated PSA  . Genital warts  . Abnormal urine odor  . Cellulitis of right leg   Past Medical History  Diagnosis Date  . Depression   . Hyperlipidemia   . Hypertension   . OSA (obstructive sleep apnea)    Past Surgical History  Procedure Date  . Cholecystectomy 1993  . Lithotripsy 2000  . Esophagogastroduodenoscopy   . Knee arthroscopy    History  Substance Use Topics  . Smoking status: Never Smoker   .  Smokeless tobacco: Never Used  . Alcohol Use: No   Family History  Problem Relation Age of Onset  . Hypertension Mother   . Heart disease Mother   . Heart disease Father   . Stroke Father   . Heart disease Brother   . Coronary artery disease Brother   . Diabetes Brother   . Cancer Maternal Aunt     breast  . Cancer Maternal Uncle     lung  . Heart disease Brother   . Coronary artery disease Brother    No Known Allergies  Medication list has been reviewed and updated.  Current Outpatient Prescriptions on File Prior to Visit  Medication Sig Dispense Refill  . amLODipine (NORVASC) 5 MG tablet Take 1 tablet (5 mg total) by mouth daily.  90 tablet  3  . aspirin 81 MG tablet Take 81 mg by mouth daily.        . potassium chloride SA (KLOR-CON M20) 20 MEQ tablet Take 1 tablet (20 mEq total) by mouth daily.  90 tablet  3  . simvastatin (ZOCOR) 40 MG tablet Take 1 tablet (40 mg total) by mouth at bedtime.  90 tablet  3  . sulfamethoxazole-trimethoprim (BACTRIM DS) 800-160 MG per tablet Take 2 tablets by mouth 2 (two) times daily.  40 tablet  0  . valsartan-hydrochlorothiazide (DIOVAN HCT) 320-25 MG per tablet Take 1 tablet by mouth daily.  90 tablet  3  . venlafaxine (EFFEXOR-XR) 75 MG 24 hr capsule TAKE 1 CAPSULE EVERY DAY  90 capsule  2    Review of Systems: Fever, pain, redness, and warmth in the right lower extremity that is worsening. No shortness of breath, chest pain. No abdominal pain. The patient is eating and drinking normally. Otherwise, the pertinent positives and negatives are listed above and in the HPI, otherwise a full review of systems has been reviewed and is negative unless noted positive.   Physical Examination: Filed Vitals:   02/09/12 0901  BP: 140/68  Pulse: 109  Temp: 99 F (37.2 C)   Filed Vitals:   02/09/12 0901  Weight: 432 lb (195.954 kg)   Pulse is 90 on my recheck   GEN: WDWN, NAD, Non-toxic, A & O x 3 HEENT: Atraumatic, Normocephalic. Neck  supple. No masses, No LAD. Ears and Nose: No external deformity. CV: RRR, No M/G/R. No JVD. No thrill. No extra heart sounds. PULM: CTA B, no wheezes, crackles, rhonchi. No retractions. No resp. distress. No accessory muscle use. EXTR: Extensive redness, warmth throughout the right lower extremity. There some fluctuance on the posterior aspect of the lower calf. PSYCH: Normally interactive. Conversant. Not depressed or anxious appearing.  Calm demeanor.    EKG / Labs / Xrays: Results for orders placed in visit on 02/07/12  POCT URINALYSIS DIPSTICK      Component Value Range   Color, UA yellow     Clarity, UA clear     Glucose, UA neg     Bilirubin, UA neg     Ketones, UA neg     Spec Grav, UA 1.015     Blood, UA moderate     pH, UA 5.0     Protein, UA trace     Urobilinogen, UA 1.0     Nitrite, UA neg     Leukocytes, UA small (1+)    POCT UA - MICROSCOPIC ONLY      Component Value Range   WBC, Ur, HPF, POC occ     RBC, urine, microscopic occ     Bacteria, U Microscopic occ     Mucus, UA       Epithelial cells, urine per micros many     Crystals, Ur, HPF, POC       Casts, Ur, LPF, POC       Yeast, UA none    URINE CULTURE      Component Value Range   Colony Count NO GROWTH     Organism ID, Bacteria NO GROWTH       Assessment and Plan: 1. Cellulitis of right leg     >40 minutes spent in face to face time with patient, >50% spent in counselling or coordination of care:  Worsening cellulitis of the right lower extremity that is extensive and encompassing the entirety of the right lower extremity and now spreading adjacent to the knee.  Failure to improve after 5 doses of high-dose Septra. The patient is failing outpatient management and not doing well. Consult placed to [facility] service to discuss admission.  Recommend blood cultures, IV vancomycin, I&D and culture of the posterior fluctuant area while in hospital.  [facility] paged at 9:20 AM.  10:10  AM spoke to Blake Smothers, NP. Will arrange a Med/Surg bed and admit to [facility], Team 10, Dr. Irene Garcia.  Hannah Beat, MD

## 2012-02-09 NOTE — Progress Notes (Signed)
VASCULAR LAB PRELIMINARY  PRELIMINARY  PRELIMINARY  PRELIMINARY  Right lower extremity venous Dopplers completed.    Preliminary report:  There is no DVT or SVT noted in the right lower extremity.  Jaisean Monteforte, 02/09/2012, 4:03 PM

## 2012-02-09 NOTE — H&P (Signed)
[facility] History and Physical  Blake Garcia ZOX:[id number] DOB: Feb 24, 1956 DOA: 02/09/2012   PCP: Kerby Nora, MD   Chief Complaint: failed outpatient therapy for  cellulitis.   HPI:  56 year old gentleman came in from doctors office as his cellulitis hasn't been improving despite being on oral antibiotics. He reports some cellulitis on the RLE about an inch long with some erythematous streaks on the RLE on 7/4. He went to pcp got a prescrption for bactrim on 7/9, but today he saw that his leg was draining clear fluid and went to see his PCP, and he was referred to [name] for admission for IV antibiotics for worsening cellulitis. He reports occasional fever. No other complaints. No trauma to the leg .   Review of Systems:  Constitutional: Denies , chills, diaphoresis, appetite change and fatigue.  HEENT: Denies photophobia, eye pain, redness, hearing loss, ear pain, congestion, sore throat, rhinorrhea, sneezing, mouth sores, trouble swallowing, neck pain, neck stiffness and tinnitus.  Respiratory: denies SOB, DOE, cough, chest tightness, and wheezing.  Cardiovascular: Denies chest pain, palpitations and leg swelling.  Gastrointestinal: Denies nausea, vomiting, abdominal pain, diarrhea, constipation, blood in stool and abdominal distention.  Genitourinary: Denies dysuria, urgency, frequency, hematuria, flank pain and difficulty urinating.  Musculoskeletal: Denies myalgias, back pain, has redness, swelling, and pain , cellulitis in the RLE.  Neurological: Denies dizziness, seizures, syncope, weakness, light-headedness, numbness and headaches.  Hematological: Denies adenopathy. Easy bruising, personal or family bleeding history  Psychiatric/Behavioral: Denies suicidal ideation, mood changes, confusion, nervousness, sleep disturbance and agitation    Past Medical History  Diagnosis Date  . Depression   . Hyperlipidemia   . Hypertension   . OSA (obstructive sleep apnea)    Past  Surgical History  Procedure Date  . Cholecystectomy 1993  . Lithotripsy 2000  . Esophagogastroduodenoscopy   . Knee arthroscopy    Social History:  reports that he has never smoked. He has never used smokeless tobacco. He reports that he does not drink alcohol or use illicit drugs.  No Known Allergies  Family History  Problem Relation Age of Onset  . Hypertension Mother   . Heart disease Mother   . Heart disease Father   . Stroke Father   . Heart disease Brother   . Coronary artery disease Brother   . Diabetes Brother   . Cancer Maternal Aunt     breast  . Cancer Maternal Uncle     lung  . Heart disease Brother   . Coronary artery disease Brother     Prior to Admission medications   Medication Sig Start Date End Date Taking? Authorizing Provider  amLODipine (NORVASC) 5 MG tablet Take 5 mg by mouth daily.   Yes Historical Provider, MD  aspirin EC 81 MG tablet Take 81 mg by mouth daily.   Yes Historical Provider, MD  potassium chloride SA (K-DUR,KLOR-CON) 20 MEQ tablet Take 20 mEq by mouth daily.   Yes Historical Provider, MD  simvastatin (ZOCOR) 40 MG tablet Take 40 mg by mouth at bedtime.   Yes Historical Provider, MD  sulfamethoxazole-trimethoprim (BACTRIM DS) 800-160 MG per tablet Take 2 tablets by mouth 2 (two) times daily.   Yes Historical Provider, MD  valsartan-hydrochlorothiazide (DIOVAN-HCT) 320-25 MG per tablet Take 1 tablet by mouth daily.   Yes Historical Provider, MD  venlafaxine XR (EFFEXOR-XR) 75 MG 24 hr capsule Take 75 mg by mouth daily.   Yes Historical Provider, MD   Physical Exam: Filed Vitals:   02/09/12  1429  BP: 95/50  Pulse: 102  Temp: 99.5 F (37.5 C)  TempSrc: Oral  Resp: 20  SpO2: 97%    Constitutional: Vital signs reviewed.  Patient is a well-developed and well-nourished  in no acute distress and cooperative with exam. Alert and oriented x3.  Head: Normocephalic and atraumatic Mouth: no erythema or exudates, MMM Eyes: PERRL, EOMI,  conjunctivae normal, No scleral icterus.  Neck: Supple, Trachea midline normal ROM, No JVD, mass, thyromegaly, or carotid bruit present.  Cardiovascular: RRR, S1 normal, S2 normal, no MRG, pulses symmetric and intact bilaterally Pulmonary/Chest: CTAB, no wheezes, rales, or rhonchi Abdominal: Soft. Non-tender, non-distended, bowel sounds are normal, no masses, organomegaly, or guarding present.  Musculoskeletal: RLE from the ankle to the knee, area is erythematous, indurated, tender , seeping clear fluid, area of fluctuance int he posterior part of the RLE.small fluid filled blebs.  Hematology: no cervical, inginal, or axillary adenopathy.  Neurological: A&O x3, Strength is normal and symmetric bilaterally, cranial nerve II-XII are grossly intact, no focal motor deficit, sensory intact to light touch bilaterally.  Skin: Warm, dry and intact. No rash, cyanosis, or clubbing.  Psychiatric: Normal mood and affect.   Labs on Admission:  Basic Metabolic Panel: No results found for this basename: NA:5,K:5,CL:5,CO2:5,GLUCOSE:5,BUN:5,CREATININE:5,CALCIUM:5,MG:5,PHOS:5 in the last 168 hours Liver Function Tests: No results found for this basename: AST:5,ALT:5,ALKPHOS:5,BILITOT:5,PROT:5,ALBUMIN:5 in the last 168 hours No results found for this basename: LIPASE:5,AMYLASE:5 in the last 168 hours No results found for this basename: AMMONIA:5 in the last 168 hours CBC: No results found for this basename: WBC:5,NEUTROABS:5,HGB:5,HCT:5,MCV:5,PLT:5 in the last 168 hours Cardiac Enzymes: No results found for this basename: CKTOTAL:5,CKMB:5,CKMBINDEX:5,TROPONINI:5 in the last 168 hours BNP: No components found with this basename: POCBNP:5 CBG: No results found for this basename: GLUCAP:5 in the last 168 hours  Radiological Exams on Admission: No results found.    Assessment/Plan Principal Problem:  *Cellulitis of right leg Active Problems:  HYPERLIPIDEMIA  Morbid obesity  HYPERTENSION  SLEEP  APNEA 1. Extensive cellulitis of the right leg from  the ankle up to the knee: failed outpatient po therapy. There are blebs and some fluctuation of the posterior aspect of the right leg. Area is erythematous, indurated, tender and seeping clear fluid. X ray of the right LE, and venous duplex ordered to evaluate for underlying abscess and dvt. Surgery consult obtained to see if he needs I&d.started the patient on iv vanco and iv unasyn. Elevate the leg.   2. Hypertension: bp borderline.  Resume home medications fromt omorrow  3. Hyperlipidemia: resume home simvastatin.  4. Sleep apnea: continue CPAP at night.   5. DVT prophylaxis    Code Status: FULL CODE Family Communication:  None at bedside Disposition Plan: probably home when medically stable.  Oper[facility] Services [facility] Pager [phone]  If 7PM-7AM, please contact night-coverage [email or web address] Password [facility] 02/09/2012, 2:46 PM

## 2012-02-09 NOTE — Progress Notes (Signed)
22:00 Placed patient on CPAP Auto Mode, Full Face Mask. He tolerated very well and said he will call if he has any problem. Thelma Barge Kinley Dozier RRT,RCP

## 2012-02-09 NOTE — Progress Notes (Signed)
ANTIBIOTIC CONSULT NOTE - INITIAL  Pharmacy Consult for Vancomycin and Unasyn Indication: Extensive RLE Cellulitis  No Known Allergies  Patient Measurements: Weight: 432 lb (195.954 kg) Ht: 67 in  Vital Signs: Temp: 99.5 F (37.5 C) (07/11 1429) Temp src: Oral (07/11 1429) BP: 95/50 mmHg (07/11 1429) Pulse Rate: 102  (07/11 1429) Intake/Output from previous day:   Intake/Output from this shift:    Labs:  Basename 02/09/12 1527  WBC 25.1*  HGB 13.6  PLT 223  LABCREA --  CREATININE 2.64*   The CrCl is unknown because both a height and weight (above a minimum accepted value) are required for this calculation. No results found for this basename: VANCOTROUGH:2,VANCOPEAK:2,VANCORANDOM:2,GENTTROUGH:2,GENTPEAK:2,GENTRANDOM:2,TOBRATROUGH:2,TOBRAPEAK:2,TOBRARND:2,AMIKACINPEAK:2,AMIKACINTROU:2,AMIKACIN:2, in the last 72 hours   Microbiology: Recent Results (from the past 720 hour(s))  URINE CULTURE     Status: Normal   Collection Time   02/07/12 10:42 AM      Component Value Range Status Comment   Colony Count NO GROWTH   Final    Organism ID, Bacteria NO GROWTH   Final     Medical History: Past Medical History  Diagnosis Date  . Depression   . Hyperlipidemia   . Hypertension   . OSA (obstructive sleep apnea)     Medications:  Prescriptions prior to admission  Medication Sig Dispense Refill  . amLODipine (NORVASC) 5 MG tablet Take 5 mg by mouth daily.      [name] aspirin EC 81 MG tablet Take 81 mg by mouth daily.      . potassium chloride SA (K-DUR,KLOR-CON) 20 MEQ tablet Take 20 mEq by mouth daily.      . simvastatin (ZOCOR) 40 MG tablet Take 40 mg by mouth at bedtime.      . sulfamethoxazole-trimethoprim (BACTRIM DS) 800-160 MG per tablet Take 2 tablets by mouth 2 (two) times daily.      . valsartan-hydrochlorothiazide (DIOVAN-HCT) 320-25 MG per tablet Take 1 tablet by mouth daily.      [name] venlafaxine XR (EFFEXOR-XR) 75 MG 24 hr capsule Take 75 mg by mouth daily.        Assessment: 56yom to start Vancomycin and Unasyn for worsening, extensive RLE cellulitis that has failed outpatient oral antibiotics. Patient's SCr is much higher on admission (2.64) compared to 1 year ago (7/12 office visit - SCr ~1).  - Tmax 99.5, WBC elevated (~25) - CrCl ~52 (normalized ~33 ml/min)  Goal of Therapy:  Vancomycin trough level 10-15 mcg/ml  Plan:  1. Vancomycin 2.5g IV x 1, then 2g IV q24h 2. Unasyn 3g IV q6h 3. Monitor renal function, UOP, cultures, antibiotic plan and order trough when indicated  Cleon Dew [phone] 02/09/2012,4:39 PM

## 2012-02-09 NOTE — Telephone Encounter (Signed)
Pt from Dr. Durel Salts office. Direct admit with CC LE cellulitis. Pt hx HTN, morbid obesity, sleep apnea given Septra BID since 02/07/12 for cellulitis presented to PCP with worsening rt. LE edema/erythema. Fever 02/08/12 102.   Today VS in office reported as 140/80, HR 106, temp 99.9 orally. Leg reportedly with chronic venous stasis changes and eczema as well. Reported some posterior fluctuance on posterior aspect of rt calf.   Also reports some dysuria  Admit med/surg for cellulitis rt lower leg, failing OP therapy.

## 2012-02-09 NOTE — Progress Notes (Signed)
Patient's right calf circumference at 15:00 02/09/12 =  47.5 cm

## 2012-02-09 NOTE — Consult Note (Signed)
I have seen and examined the patient and agree with the assessment and plans.  Keiley Levey A. Loriene Taunton  MD, FACS  

## 2012-02-09 NOTE — Consult Note (Signed)
Reason for Consult:Cellulitus RLE Referring Physician: Dr. Alyce Pagan  Blake Garcia is an 56 y.o. male.  HPI: who came in from doctors office as his cellulitis hasn't been improving despite being on oral antibiotics. He reports some cellulitis on the RLE about an inch long with some erythematous streaks on the RLE on 7/4. He went to pcp got a prescrption for bactrim on 7/9, but today he saw that his leg was draining clear fluid and went to see his PCP, and he was referred to [name] for admission for IV antibiotics for worsening cellulitis. He reports occasional fever. No other complaints. No trauma to the leg . Erythema has extended slightly above the knee from previous skin mapping done earlier today. Negative for calf pain, positive pedal pulse and sensation in right foot.     Past Medical History  Diagnosis Date  . Depression   . Hyperlipidemia   . Hypertension   . OSA (obstructive sleep apnea)     Past Surgical History  Procedure Date  . Cholecystectomy 1993  . Lithotripsy 2000  . Esophagogastroduodenoscopy   . Knee arthroscopy     Family History  Problem Relation Age of Onset  . Hypertension Mother   . Heart disease Mother   . Heart disease Father   . Stroke Father   . Heart disease Brother   . Coronary artery disease Brother   . Diabetes Brother   . Cancer Maternal Aunt     breast  . Cancer Maternal Uncle     lung  . Heart disease Brother   . Coronary artery disease Brother     Social History:  reports that he has never smoked. He has never used smokeless tobacco. He reports that he does not drink alcohol or use illicit drugs.  Allergies: No Known Allergies  Medications: I have reviewed the patient's current medications.  No results found for this or any previous visit (from the past 48 hour(s)).  No results found.  Review of Systems  Constitutional: Negative.   HENT: Negative.   Eyes: Negative.   Respiratory: Negative.   Cardiovascular: Positive for  leg swelling. Negative for chest pain, palpitations, orthopnea, claudication and PND.  Gastrointestinal: Negative.   Musculoskeletal: Negative.   Skin: Negative.  Negative for itching and rash.  Neurological: Negative.   Endo/Heme/Allergies: Negative.   Psychiatric/Behavioral: Negative.    Blood pressure 95/50, pulse 102, temperature 99.5 F (37.5 C), temperature source Oral, resp. rate 20, weight 432 lb (195.954 kg), SpO2 97.00%. Physical Exam  Constitutional: He is oriented to person, place, and time. He appears well-developed and well-nourished. No distress.  HENT:  Head: Normocephalic and atraumatic.  Eyes: Conjunctivae are normal. Pupils are equal, round, and reactive to light. Right eye exhibits no discharge. Left eye exhibits no discharge. No scleral icterus.  Neck: Normal range of motion. Neck supple. No JVD present. No tracheal deviation present. No thyromegaly present.  Cardiovascular: Normal rate, regular rhythm and normal heart sounds.  Exam reveals no gallop and no friction rub.   No murmur heard. Respiratory: Effort normal and breath sounds normal. No stridor. No respiratory distress. He has no wheezes. He has no rales. He exhibits no tenderness.  GI: Soft. Bowel sounds are normal. He exhibits no distension and no mass. There is no tenderness. There is no rebound and no guarding.  Musculoskeletal: He exhibits edema and tenderness.  Lymphadenopathy:    He has no cervical adenopathy.  Neurological: He is alert and oriented to person, place, and  time.  Skin: Skin is warm and dry. He is not diaphoretic. There is erythema.  Psychiatric: He has a normal mood and affect.    Assessment/Plan: 1. Cellulitis of RLE; failed outpatient antibiotic therapy. 2. Agree with current plan by medicine. 3. Recommend Ortho consult given fact that cellulitis has now crossed over knee joint, to r/o any degree of infection in the joint space. 3. Do not feel there is currently any need for  surgical intervention presently; but will await further test results. Based on these results we may need to Interceed with I&D if indicated. 4. Recommend also adding daily circumference measurements, noted on chart.  Blenda Mounts 02/09/2012, 3:34 PM

## 2012-02-10 ENCOUNTER — Inpatient Hospital Stay (HOSPITAL_COMMUNITY): Payer: BC Managed Care – PPO

## 2012-02-10 LAB — BASIC METABOLIC PANEL
BUN: 31 mg/dL — ABNORMAL HIGH (ref 6–23)
Chloride: 91 mEq/L — ABNORMAL LOW (ref 96–112)
Creatinine, Ser: 1.96 mg/dL — ABNORMAL HIGH (ref 0.50–1.35)
GFR calc Af Amer: 43 mL/min — ABNORMAL LOW (ref >90)
GFR calc non Af Amer: 37 mL/min — ABNORMAL LOW (ref >90)

## 2012-02-10 LAB — CBC
HCT: 38.5 % — ABNORMAL LOW (ref 39.0–52.0)
MCHC: 35.8 g/dL (ref 30.0–36.0)
MCV: 86.1 fL (ref 78.0–100.0)
RDW: 14.6 % (ref 11.5–15.5)

## 2012-02-10 MED ORDER — POTASSIUM CHLORIDE 10 MEQ/100ML IV SOLN
10.0000 meq | INTRAVENOUS | Status: AC
  Administered 2012-02-10 (×2): 10 meq via INTRAVENOUS
  Filled 2012-02-10 (×2): qty 100

## 2012-02-10 MED ORDER — POTASSIUM CHLORIDE 10 MEQ/100ML IV SOLN
10.0000 meq | INTRAVENOUS | Status: DC
  Filled 2012-02-10 (×3): qty 100

## 2012-02-10 MED ORDER — POTASSIUM CHLORIDE CRYS ER 20 MEQ PO TBCR
40.0000 meq | EXTENDED_RELEASE_TABLET | Freq: Two times a day (BID) | ORAL | Status: AC
  Administered 2012-02-10 (×2): 40 meq via ORAL
  Filled 2012-02-10 (×2): qty 2

## 2012-02-10 MED ORDER — VANCOMYCIN HCL 1000 MG IV SOLR
1500.0000 mg | Freq: Two times a day (BID) | INTRAVENOUS | Status: DC
  Administered 2012-02-10 – 2012-02-13 (×8): 1500 mg via INTRAVENOUS
  Filled 2012-02-10 (×10): qty 1500

## 2012-02-10 MED ORDER — SODIUM CHLORIDE 0.9 % IV SOLN
INTRAVENOUS | Status: DC
  Administered 2012-02-10: 20:00:00 via INTRAVENOUS

## 2012-02-10 NOTE — Progress Notes (Signed)
PT Cancellation Note  Evaluation cancelled today due to patient's refusal to participate.  Patient reports he is ambulating in room independently.  He declines PT evaluation - independent with mobility/gait per patient.  PT will sign off.  Please re-order if needed at later time.  Thank you.  Vena Austria 02/10/2012, 11:39 AM [phone]

## 2012-02-10 NOTE — Consult Note (Signed)
WOC consult Note Reason for Consult: eval cellulitis of the RLE. Pt has been seen by surgery and they are not planning any surgical intervention.  Per surgery note erythema improving.  He has two open draining areas of the leg. I have explained that the cellulitis will be treated from the inside out with iv abtx, and that from my perspective I can assist with topical care for management of the drainage.  He reports that he has venous stasis but has not had cellulitis before. He has compression stockings that he wears for management of his venous dx.  Wound type: blisters secondary to edema and cellulitis Measurement: R posterior calf: 0.5cm x 1.0cm x 0.2cm, R pretibial 0.5cm x 0.5cm x 0.2cm  Wound bed: pink and moist Drainage (amount, consistency, odor) serous, more noted from pretibial area than posterior Periwound: extensive erythema of the entire RLE from malleolar region to above knee, marked by bedside nursing to monitor for changes Dressing procedure/placement/frequency: will order silicone foam dressing for the open areas to manage exudate  Re consult if needed, will not follow at this time. Thanks  Trenton Verne [employer], CWOCN [phone])

## 2012-02-10 NOTE — Progress Notes (Signed)
I have seen and examined the patient and agree with the assessment and plans.  With improvement, doubt abscess.  Continue current management.  Will sign off.  Available if needed.  Muaad Boehning A. Magnus Ivan  MD, FACS

## 2012-02-10 NOTE — Progress Notes (Signed)
Utilization review completed. Kashius Dominic, RN, BSN. 

## 2012-02-10 NOTE — Progress Notes (Signed)
  Subjective: Doing better this morning, less swelling and "drainage" has stopped.   Objective: Vital signs in last 24 hours: Temp:  [98.4 F (36.9 C)-99.5 F (37.5 C)] 98.4 F (36.9 C) (07/12 0619) Pulse Rate:  [91-109] 91  (07/12 0619) Resp:  [18-20] 20  (07/12 0619) BP: (95-140)/(47-68) 99/56 mmHg (07/12 0619) SpO2:  [94 %-97 %] 94 % (07/12 0619) Weight:  [432 lb (195.954 kg)] 432 lb (195.954 kg) (07/11 1429) Last BM Date: 02/09/12  Intake/Output from previous day:   Intake/Output this shift:    General appearance: alert, cooperative, appears stated age and no distress Extremities: Right LE appears to be less erythematous this morning, circumference is reported as decreasing, less serous drainage noted.  Lab Results:   Basename 02/10/12 0600 02/09/12 1527  WBC 21.9* 25.1*  HGB 13.8 13.6  HCT 38.5* 38.7*  PLT 247 223   BMET  Basename 02/10/12 0600 02/09/12 1527  NA 130* 132*  K 2.8* 3.1*  CL 91* 92*  CO2 24 24  GLUCOSE 119* 116*  BUN 31* 32*  CREATININE 1.96* 2.64*  CALCIUM 8.8 8.7   PT/INR  Basename 02/09/12 1527  LABPROT 16.0*  INR 1.25   ABG No results found for this basename: PHART:2,PCO2:2,PO2:2,HCO3:2 in the last 72 hours  Studies/Results: Dg Tibia/fibula Right  02/09/2012  *RADIOLOGY REPORT*  Clinical Data: 56 year old male cellulitis swelling and pain.  RIGHT TIBIA AND FIBULA - 2 VIEW  Comparison: None.  Findings: Diffuse.  Diffuse soft tissue swelling stranding in the visualized right lower extremity. Bone mineralization is within normal limits.  Right tibia and fibula appear intact.  Degenerative changes at the right knee.  Less pronounced degenerative changes at the right ankle.  No subcutaneous gas. No radiopaque foreign body identified.  IMPRESSION: No acute osseous abnormality identified about the right tib-fib. Soft tissue swelling and stranding.  Original Report Authenticated By: Harley Hallmark, M.D.    Anti-infectives: Anti-infectives       Start     Dose/Rate Route Frequency Ordered Stop   02/10/12 1800   vancomycin (VANCOCIN) 2,000 mg in sodium chloride 0.9 % 500 mL IVPB  Status:  Discontinued        2,000 mg 250 mL/hr over 120 Minutes Intravenous Every 24 hours 02/09/12 1648 02/10/12 0807   02/10/12 1000   vancomycin (VANCOCIN) 1,500 mg in sodium chloride 0.9 % 500 mL IVPB        1,500 mg 250 mL/hr over 120 Minutes Intravenous Every 12 hours 02/10/12 0807     02/09/12 1800   vancomycin (VANCOCIN) 2,500 mg in sodium chloride 0.9 % 500 mL IVPB        2,500 mg 250 mL/hr over 120 Minutes Intravenous  Once 02/09/12 1648     02/09/12 1800   Ampicillin-Sulbactam (UNASYN) 3 g in sodium chloride 0.9 % 100 mL IVPB        3 g 100 mL/hr over 60 Minutes Intravenous Every 6 hours 02/09/12 1648            Assessment/Plan: s/p * No surgery found * 1.Hyponatremia 2.Hypokalemia 3. Cellulitis of RLE 4. WBC count improving on ABX 5.Management as per medicine team   LOS: 1 day    Oren Barella 02/10/2012

## 2012-02-10 NOTE — Progress Notes (Addendum)
[facility] PROGRESS NOTE  Bl[facility] Garcia ZOX:[id number] DOB: Nov 13, 1955 DOA: 02/09/2012 PCP: Kerby Nora, MD  Assessment/Plan: Principal Problem:  *Cellulitis of right leg Active Problems:  HYPERLIPIDEMIA  Morbid obesity  HYPERTENSION  SLEEP APNEA  1. Extensive cellulitis of the right leg from the ankle up to the knee: failed outpatient po therapy. There are blebs and some fluctuation of the posterior aspect of the right leg. Area is erythematous, indurated, tender and seeping clear fluid. X ray of the right LE shows soft tissue swelling and venous duplex did not show any svt or DVT. Surgery input appreciated, no indication of any procedure  patient on iv vanco and iv unasyn. Elevate the leg.  2. Hypertension: bp borderline. Hold home BP meds. 3. Hyperlipidemia: resume home simvastatin.  4. Sleep apnea: continue CPAP at night.  5. Acute renal failure: not sure if its acute or chronic, his last normal creatinine is 1 last year. [facility] does not show hydronephrosis. Will order urine electrolytes. Gentle hydration. And will evaluate with a repeat BMP in am.  6. Leukocytosis: probably from the cellulitis. Improving.  7. Hypokalemia and hyponatremia:  Probably from dehydration, will start him on gentle hydration and replete K as needed. Magnesium level is normal.  8. Morbid obesity  DVT prophylaxis Lovenox.  Brief narrative:  56 year old gentleman came in from doctors office as his cellulitis hasn't been improving despite being on oral antibiotics. He reports some cellulitis on the RLE about an inch long with some erythematous streaks on the RLE on 7/4. He went to pcp got a prescrption for bactrim on 7/9, but today he saw that his leg was draining clear fluid and went to see his PCP, and he was referred to [facility] for admission for IV antibiotics for worsening cellulitis.  Consultants:  Surgery consult  Wound care consult.  Procedures:  cxr   XR of the right leg  [facility]  Garcia.  Antibiotics:  Vancomycin and unasyn day 2.  HPI/Subjective: Much better .   Objective: Filed Vitals:   02/09/12 2138 02/09/12 2203 02/10/12 0619 02/10/12 0700  BP: 100/47  99/56   Pulse: 95 92 91   Temp: 98.6 F (37 C)  98.4 F (36.9 C)   TempSrc:      Resp: 18 18 20    Height:    5' 6.93" (1.7 m)  Weight:      SpO2: 95%  94%    No intake or output data in the 24 hours ending 02/10/12 0920  Exam:   General:  Alert afebrile comfortable  Cardiovascular: s1s2 heard, no MRG  Respiratory: Decreased at bases  Abdomen: soft NT ND BS+.  Extremities: RLE, erythema has improved.   Data Reviewed: Basic Metabolic Panel:  Lab 02/10/12 8119 02/09/12 1527  NA 130* 132*  K 2.8* 3.1*  CL 91* 92*  CO2 24 24  GLUCOSE 119* 116*  BUN 31* 32*  CREATININE 1.96* 2.64*  CALCIUM 8.8 8.7  MG -- 2.3  PHOS -- 2.7   Liver Function Tests:  Lab 02/09/12 1527  AST 17  ALT 15  ALKPHOS 100  BILITOT 0.7  PROT 7.2  ALBUMIN 2.5*   No results found for this basename: LIPASE:5,AMYLASE:5 in the last 168 hours No results found for this basename: AMMONIA:5 in the last 168 hours CBC:  Lab 02/10/12 0600 02/09/12 1527  WBC 21.9* 25.1*  NEUTROABS -- 22.3*  HGB 13.8 13.6  HCT 38.5* 38.7*  MCV 86.1 85.6  PLT 247 223  Cardiac Enzymes: No results found for this basename: CKTOTAL:5,CKMB:5,CKMBINDEX:5,TROPONINI:5 in the last 168 hours BNP (last 3 results) No results found for this basename: PROBNP:3 in the last 8760 hours CBG: No results found for this basename: GLUCAP:5 in the last 168 hours  Recent Results (from the past 240 hour(s))  URINE CULTURE     Status: Normal   Collection Time   02/07/12 10:42 AM      Component Value Range Status Comment   Colony Count NO GROWTH   Final    Organism ID, Bacteria NO GROWTH   Final   MRSA PCR SCREENING     Status: Normal   Collection Time   02/09/12  3:40 PM      Component Value Range Status Comment   MRSA by PCR NEGATIVE   NEGATIVE Final      Studies: Dg Tibia/fibula Right  02/09/2012  *RADIOLOGY REPORT*  Clinical Data: 56 year old male cellulitis swelling and pain.  RIGHT TIBIA AND FIBULA - 2 VIEW  Comparison: None.  Findings: Diffuse.  Diffuse soft tissue swelling stranding in the visualized right lower extremity. Bone mineralization is within normal limits.  Right tibia and fibula appear intact.  Degenerative changes at the right knee.  Less pronounced degenerative changes at the right ankle.  No subcutaneous gas. No radiopaque foreign body identified.  IMPRESSION: No acute osseous abnormality identified about the right tib-fib. Soft tissue swelling and stranding.  Original Report Authenticated By: Harley Hallmark, M.D.    Scheduled Meds:   . ampicillin-sulbactam (UNASYN) IV  3 g Intravenous Q6H  . aspirin EC  81 mg Oral Daily  . enoxaparin (LOVENOX) injection  40 mg Subcutaneous Q24H  . potassium chloride  10 mEq Intravenous Q1 Hr x 3  . simvastatin  40 mg Oral QHS  . sodium chloride  3 mL Intravenous Q12H  . vancomycin  1,500 mg Intravenous Q12H  . vancomycin  2,500 mg Intravenous Once  . venlafaxine XR  75 mg Oral Q breakfast  . DISCONTD: amLODipine  5 mg Oral Daily  . DISCONTD: hydrochlorothiazide  25 mg Oral Daily  . DISCONTD: irbesartan  300 mg Oral Daily  . DISCONTD: valsartan-hydrochlorothiazide  1 tablet Oral Daily  . DISCONTD: vancomycin  2,000 mg Intravenous Q24H   Continuous Infusions:    Stacia Feazell [facility] Pager [phone]  If 7PM-7AM, please contact night-coverage [email or web address] Password [email or web address] 02/10/2012, 9:20 AM   LOS: 1 day

## 2012-02-10 NOTE — Progress Notes (Signed)
Circumference of right leg is 47.3cm  @ 02/10/12  05:05am.

## 2012-02-10 NOTE — Progress Notes (Signed)
ANTIBIOTIC CONSULT NOTE - FOLLOW UP  Pharmacy Consult for vancomycin/unasyn Indication: Extensive RLE Cellulitis  No Known Allergies  Patient Measurements: Height: 5' 6.93" (170 cm) Weight: 432 lb (195.954 kg) IBW/kg (Calculated) : 65.94   Vital Signs: Temp: 98.4 F (36.9 C) (07/12 0619) BP: 99/56 mmHg (07/12 0619) Pulse Rate: 91  (07/12 0619) Intake/Output from previous day:   Intake/Output from this shift:    Labs:  Basename 02/10/12 0600 02/09/12 1527  WBC 21.9* 25.1*  HGB 13.8 13.6  PLT 247 223  LABCREA -- --  CREATININE 1.96* 2.64*   Estimated Creatinine Clearance: 71 ml/min (by C-G formula based on Cr of 1.96). No results found for this basename: VANCOTROUGH:2,VANCOPEAK:2,VANCORANDOM:2,GENTTROUGH:2,GENTPEAK:2,GENTRANDOM:2,TOBRATROUGH:2,TOBRAPEAK:2,TOBRARND:2,AMIKACINPEAK:2,AMIKACINTROU:2,AMIKACIN:2, in the last 72 hours   Microbiology: Recent Results (from the past 720 hour(s))  URINE CULTURE     Status: Normal   Collection Time   02/07/12 10:42 AM      Component Value Range Status Comment   Colony Count NO GROWTH   Final    Organism ID, Bacteria NO GROWTH   Final   MRSA PCR SCREENING     Status: Normal   Collection Time   02/09/12  3:40 PM      Component Value Range Status Comment   MRSA by PCR NEGATIVE  NEGATIVE Final     Anti-infectives     Start     Dose/Rate Route Frequency Ordered Stop   02/10/12 1800   vancomycin (VANCOCIN) 2,000 mg in sodium chloride 0.9 % 500 mL IVPB        2,000 mg 250 mL/hr over 120 Minutes Intravenous Every 24 hours 02/09/12 1648     02/09/12 1800   vancomycin (VANCOCIN) 2,500 mg in sodium chloride 0.9 % 500 mL IVPB        2,500 mg 250 mL/hr over 120 Minutes Intravenous  Once 02/09/12 1648     02/09/12 1800   Ampicillin-Sulbactam (UNASYN) 3 g in sodium chloride 0.9 % 100 mL IVPB        3 g 100 mL/hr over 60 Minutes Intravenous Every 6 hours 02/09/12 1648            Assessment: 56yom on Vancomycin and Unasyn for  worsening, extensive RLE cellulitis that has failed outpatient oral antibiotics. Patient's SCr is trending down.  Goal of Therapy:  Vancomycin trough level 10-15 mcg/ml  Plan:  -Change vancomycin to 1500mg  IV q12h -No Unasyn changes needed -Will follow cultures and renal function  Harland German, Pharm D 02/10/2012 7:58 AM

## 2012-02-11 LAB — PROCALCITONIN: Procalcitonin: 1.55 ng/mL

## 2012-02-11 LAB — URINE CULTURE: Colony Count: NO GROWTH

## 2012-02-11 LAB — BASIC METABOLIC PANEL
BUN: 22 mg/dL (ref 6–23)
Calcium: 8.9 mg/dL (ref 8.4–10.5)
GFR calc Af Amer: 73 mL/min — ABNORMAL LOW (ref >90)
GFR calc non Af Amer: 63 mL/min — ABNORMAL LOW (ref >90)
Glucose, Bld: 116 mg/dL — ABNORMAL HIGH (ref 70–99)
Potassium: 4.2 mEq/L (ref 3.5–5.1)

## 2012-02-11 LAB — CBC
HCT: 39.3 % (ref 39.0–52.0)
Hemoglobin: 13.6 g/dL (ref 13.0–17.0)
MCH: 30.6 pg (ref 26.0–34.0)
MCHC: 34.6 g/dL (ref 30.0–36.0)
RDW: 15 % (ref 11.5–15.5)

## 2012-02-11 NOTE — Progress Notes (Addendum)
Patient's right calf circumference at 0600 02/11/12 = 47.2 cm.

## 2012-02-11 NOTE — Progress Notes (Signed)
Placed pt. On cpap. Pt. Is on auto titrate and is wearing a full face mask. Pt. Is tolerating well at this time.

## 2012-02-11 NOTE — Progress Notes (Signed)
[facility] PROGRESS NOTE  Blake Garcia AVW:[id number] DOB: 1955/10/25 DOA: 02/09/2012 PCP: Kerby Nora, MD  Assessment/Plan: Principal Problem:  *Cellulitis of right leg Active Problems:  HYPERLIPIDEMIA  Morbid obesity  HYPERTENSION  SLEEP APNEA  1. Extensive cellulitis of the right leg from the ankle up to the knee: failed outpatient po therapy. There are blebs and some fluctuation of the posterior aspect of the right leg. Area is erythematous, indurated, tender and seeping clear fluid. X ray of the right LE shows soft tissue swelling and venous duplex did not show any svt or DVT. Surgery input appreciated, no indication of any procedure  patient on iv vanco and iv unasyn. Elevate the leg.  2. Hypertension: bp slightly high. Will restart home BP meds. 3. Hyperlipidemia: resume home simvastatin.  4. Sleep apnea: continue CPAP at night.  5. Acute renal failure:  Resolved,, his last normal creatinine is 1 last year. US kidney does not show hydronephrosis. . Gentle hydration.  6. Leukocytosis: probably from the cellulitis. Improving.  7. Hypokalemia and hyponatremia:  Probably from dehydration, will start him on gentle hydration and replete K as needed. Magnesium level is normal.  8. Morbid obesity  DVT prophylaxis Lovenox.  Brief narrative:  56 year old gentleman came in from doctors office as his cellulitis hasn't been improving despite being on oral antibiotics. He reports some cellulitis on the RLE about an inch long with some erythematous streaks on the RLE on 7/4. He went to pcp got a prescrption for bactrim on 7/9, but today he saw that his leg was draining clear fluid and went to see his PCP, and he was referred to [name] for admission for IV antibiotics for worsening cellulitis.  Consultants:  Surgery consult  Wound care consult.  Procedures:  cxr   XR of the right leg  US kidney.  Antibiotics:  Vancomycin and unasyn day 2.  HPI/Subjective: Much better .    Objective: Filed Vitals:   02/10/12 1400 02/10/12 2136 02/11/12 0321 02/11/12 0633  BP: 116/66 116/69  130/81  Pulse: 102 103  94  Temp: 98.8 F (37.1 C) 100.3 F (37.9 C) 98.2 F (36.8 C) 99.2 F (37.3 C)  TempSrc:      Resp: 20 20  20   Height:      Weight:      SpO2: 94% 93%  91%    Intake/Output Summary (Last 24 hours) at 02/11/12 1027 Last data filed at 02/10/12 1700  Gross per 24 hour  Intake    960 ml  Output      0 ml  Net    960 ml    Exam:   General:  Alert afebrile comfortable  Cardiovascular: s1s2 heard, no MRG  Respiratory: Decreased at bases  Abdomen: soft NT ND BS+.  Extremities: RLE, erythema has improved.   Data Reviewed: Basic Metabolic Panel:  Lab 02/11/12 8295 02/10/12 0600 02/09/12 1527  NA 131* 130* 132*  K 4.2 2.8* 3.1*  CL 95* 91* 92*  CO2 24 24 24   GLUCOSE 116* 119* 116*  BUN 22 31* 32*  CREATININE 1.25 1.96* 2.64*  CALCIUM 8.9 8.8 8.7  MG 2.6* -- 2.3  PHOS -- -- 2.7   Liver Function Tests:  Lab 02/09/12 1527  AST 17  ALT 15  ALKPHOS 100  BILITOT 0.7  PROT 7.2  ALBUMIN 2.5*   No results found for this basename: LIPASE:5,AMYLASE:5 in the last 168 hours No results found for this basename: AMMONIA:5 in the last 168  hours CBC:  Lab 02/11/12 0525 02/10/12 0600 02/09/12 1527  WBC 21.2* 21.9* 25.1*  NEUTROABS -- -- 22.3*  HGB 13.6 13.8 13.6  HCT 39.3 38.5* 38.7*  MCV 88.3 86.1 85.6  PLT 363 247 223   Cardiac Enzymes: No results found for this basename: CKTOTAL:5,CKMB:5,CKMBINDEX:5,TROPONINI:5 in the last 168 hours BNP (last 3 results) No results found for this basename: PROBNP:3 in the last 8760 hours CBG: No results found for this basename: GLUCAP:5 in the last 168 hours  Recent Results (from the past 240 hour(s))  URINE CULTURE     Status: Normal   Collection Time   02/07/12 10:42 AM      Component Value Range Status Comment   Colony Count NO GROWTH   Final    Organism ID, Bacteria NO GROWTH   Final   MRSA  PCR SCREENING     Status: Normal   Collection Time   02/09/12  3:40 PM      Component Value Range Status Comment   MRSA by PCR NEGATIVE  NEGATIVE Final   URINE CULTURE     Status: Normal   Collection Time   02/09/12  6:51 PM      Component Value Range Status Comment   Specimen Description URINE, CLEAN CATCH   Final    Special Requests NONE   Final    Culture  Setup Time 02/09/2012 19:20   Final    Colony Count NO GROWTH   Final    Culture NO GROWTH   Final    Report Status 02/11/2012 FINAL   Final      Studies: Dg Tibia/fibula Right  02/09/2012  *RADIOLOGY REPORT*  Clinical Data: 56 year old male cellulitis swelling and pain.  RIGHT TIBIA AND FIBULA - 2 VIEW  Comparison: None.  Findings: Diffuse.  Diffuse soft tissue swelling stranding in the visualized right lower extremity. Bone mineralization is within normal limits.  Right tibia and fibula appear intact.  Degenerative changes at the right knee.  Less pronounced degenerative changes at the right ankle.  No subcutaneous gas. No radiopaque foreign body identified.  IMPRESSION: No acute osseous abnormality identified about the right tib-fib. Soft tissue swelling and stranding.  Original Report Authenticated By: Harley Hallmark, M.D.    Scheduled Meds:    . ampicillin-sulbactam (UNASYN) IV  3 g Intravenous Q6H  . aspirin EC  81 mg Oral Daily  . enoxaparin (LOVENOX) injection  40 mg Subcutaneous Q24H  . potassium chloride  10 mEq Intravenous Q1 Hr x 2  . potassium chloride  40 mEq Oral BID  . simvastatin  40 mg Oral QHS  . vancomycin  1,500 mg Intravenous Q12H  . vancomycin  2,500 mg Intravenous Once  . venlafaxine XR  75 mg Oral Q breakfast  . DISCONTD: potassium chloride  10 mEq Intravenous Q1 Hr x 3  . DISCONTD: sodium chloride  3 mL Intravenous Q12H   Continuous Infusions:    . sodium chloride 50 mL/hr at 02/10/12 2027     [facility] Pager [phone]  If 7PM-7AM, please contact  night-coverage [email or web address] Password [email or web address] 02/11/2012, 10:27 AM   LOS: 2 days

## 2012-02-12 ENCOUNTER — Inpatient Hospital Stay (HOSPITAL_COMMUNITY): Payer: BC Managed Care – PPO

## 2012-02-12 DIAGNOSIS — L02419 Cutaneous abscess of limb, unspecified: Principal | ICD-10-CM

## 2012-02-12 DIAGNOSIS — I517 Cardiomegaly: Secondary | ICD-10-CM

## 2012-02-12 LAB — CBC
HCT: 40.3 % (ref 39.0–52.0)
Hemoglobin: 14.3 g/dL (ref 13.0–17.0)
MCV: 88.4 fL (ref 78.0–100.0)
RBC: 4.56 MIL/uL (ref 4.22–5.81)
WBC: 17.5 10*3/uL — ABNORMAL HIGH (ref 4.0–10.5)

## 2012-02-12 LAB — BASIC METABOLIC PANEL
CO2: 30 mEq/L (ref 19–32)
Calcium: 8.8 mg/dL (ref 8.4–10.5)
Creatinine, Ser: 0.89 mg/dL (ref 0.50–1.35)
GFR calc Af Amer: >90 mL/min (ref >90)
GFR calc non Af Amer: >90 mL/min (ref >90)
Sodium: 131 mEq/L — ABNORMAL LOW (ref 135–145)

## 2012-02-12 LAB — HEPARIN ANTI-XA: Heparin LMW: <0.1 IU/mL

## 2012-02-12 MED ORDER — ASPIRIN 81 MG PO CHEW
CHEWABLE_TABLET | ORAL | Status: AC
  Administered 2012-02-12: 81 mg
  Filled 2012-02-12: qty 1

## 2012-02-12 MED ORDER — ENOXAPARIN SODIUM 100 MG/ML ~~LOC~~ SOLN
100.0000 mg | Freq: Every day | SUBCUTANEOUS | Status: DC
  Administered 2012-02-13 – 2012-02-20 (×8): 100 mg via SUBCUTANEOUS
  Filled 2012-02-12 (×9): qty 1

## 2012-02-12 MED ORDER — ENOXAPARIN SODIUM 60 MG/0.6ML ~~LOC~~ SOLN
60.0000 mg | SUBCUTANEOUS | Status: AC
  Administered 2012-02-13: 60 mg via SUBCUTANEOUS
  Filled 2012-02-12: qty 0.6

## 2012-02-12 MED ORDER — AMLODIPINE BESYLATE 10 MG PO TABS
10.0000 mg | ORAL_TABLET | Freq: Every day | ORAL | Status: DC
  Administered 2012-02-12 – 2012-02-21 (×10): 10 mg via ORAL
  Filled 2012-02-12 (×10): qty 1

## 2012-02-12 MED ORDER — DIPHENHYDRAMINE HCL 12.5 MG/5ML PO ELIX: 12.5000 mg | ORAL_SOLUTION | Freq: Every evening | ORAL | Status: DC | PRN

## 2012-02-12 MED ORDER — FUROSEMIDE 10 MG/ML IJ SOLN
20.0000 mg | Freq: Once | INTRAMUSCULAR | Status: AC
  Administered 2012-02-12: 20 mg via INTRAVENOUS
  Filled 2012-02-12 (×2): qty 2

## 2012-02-12 NOTE — Progress Notes (Signed)
ANTICOAGULATION CONSULT NOTE - Initial Consult  Pharmacy Consult for Lovenox Indication: VTE prophylaxis  No Known Allergies  Patient Measurements: Height: 5' 6.93" (170 cm) Weight: 432 lb (195.954 kg) IBW/kg (Calculated) : 65.94   Vital Signs: Temp: 98.6 F (37 C) (07/14 2241) Temp src: Oral (07/14 2241) BP: 153/93 mmHg (07/14 2241) Pulse Rate: 104  (07/14 2241)  Labs:  Basename 02/12/12 1250 02/12/12 0705 02/11/12 0525 02/10/12 0600  HGB -- 14.3 13.6 --  HCT -- 40.3 39.3 38.5*  PLT -- 281 363 247  APTT -- -- -- --  LABPROT -- -- -- --  INR -- -- -- --  HEPARINUNFRC -- -- -- --  CREATININE 0.89 -- 1.25 1.96*  CKTOTAL -- -- -- --  CKMB -- -- -- --  TROPONINI -- -- -- --    Estimated Creatinine Clearance: 156.4 ml/min (by C-G formula based on Cr of 0.89).   Medical History: Past Medical History  Diagnosis Date  . Depression   . Hyperlipidemia   . Hypertension   . Kidney stones   . Complication of anesthesia ~ 2000    "during OR for kidney stones; anesthesia RX made me code twice in one week"  . Cellulitis 02/09/2012    RLE  . OSA (obstructive sleep apnea)     "wear CPAP"  . History of blood transfusion 1958    "I was an Rh baby"    Medications:  Prescriptions prior to admission  Medication Sig Dispense Refill  . amLODipine (NORVASC) 5 MG tablet Take 5 mg by mouth daily.      [name] aspirin EC 81 MG tablet Take 81 mg by mouth daily.      . potassium chloride SA (K-DUR,KLOR-CON) 20 MEQ tablet Take 20 mEq by mouth daily.      . simvastatin (ZOCOR) 40 MG tablet Take 40 mg by mouth at bedtime.      . sulfamethoxazole-trimethoprim (BACTRIM DS) 800-160 MG per tablet Take 2 tablets by mouth 2 (two) times daily.      . valsartan-hydrochlorothiazide (DIOVAN-HCT) 320-25 MG per tablet Take 1 tablet by mouth daily.      [name] venlafaxine XR (EFFEXOR-XR) 75 MG 24 hr capsule Take 75 mg by mouth daily.        Assessment: 56 y.o. male known to pharmacy from antibiotic dosing.  Pt on lovenox 40mg  for VTE prophylaxis. Wt 196 kg. BMI = 67. Anti-Xa level (4 hrs post lovenox dose) <0.1 (undetectable) so SUBtherapeutic.  Goal of Therapy:  Anti-Xa level 0.3-0.6 units/ml 4hrs after LMWH dose given Monitor platelets by anticoagulation protocol: Yes   Plan:  1. Will give lovenox 60mg  SQ x 1 now then lovenox 100mg  SQ q24h (0.5 mg/kg) in obese pt for VTE prophylaxis. 2. Will f/u bleeding risk, renal function  Christoper Fabian, PharmD, BCPS Clinical pharmacist, pager [phone] 02/12/2012,11:34 PM

## 2012-02-12 NOTE — Consult Note (Signed)
Date of Admission:  02/09/2012  Date of Consult:  02/12/2012  Reason for Consult:fever, cellulitis Referring Physician: Blake Divine  Impression/Recommendation Fever Cellulitis Would- keep leg elevated Stop unasyn Check C diff consider repeat u/s or CT LE if further temps (>100.8) or WBC not improving Wound care eval.  Compression as possible Check HIV (as per CDC rec) Weight loss counseling  Comment- His leg is as expected for his weight and clinical history. He needs good wound care, compression for decreasing fluid, less anbx.    Thank you so much for this interesting consult,   Johny Sax [phone]  Blake Garcia is an 56 y.o. male.  HPI: 56 yo M with morbid obesity, sleep apnea,   Admitted on 7-11 after developing erythema of his RLE at home for ___ days. He denies hx of trauma to the leg. He was started on bactrim on 7-9 but had no improvement. He began to develop blistering on his leg at home.  In Ed was found to have WBC 25.1, Cr 2.64, afebrile.  He was started on vanco/unasyn. He had LE dopplers (-) for DVT. Plain films showed soft tissue swelling and stranding. His Cr normalized. On 7-12 he had temp 100.3, temp 100.5 last pm. His WBC has come down to 17.5.   Past Medical History  Diagnosis Date  . Depression   . Hyperlipidemia   . Hypertension   . Kidney stones   . Complication of anesthesia ~ 2000    "during OR for kidney stones; anesthesia RX made me code twice in one week"  . Cellulitis 02/09/2012    RLE  . OSA (obstructive sleep apnea)     "wear CPAP"  . History of blood transfusion 1958    "I was an Rh baby"    Past Surgical History  Procedure Date  . Lithotripsy 2000  . Esophagogastroduodenoscopy   . Knee arthroscopy ~ 2004    right  . Cholecystectomy 1993  ergies:   No Known Allergies  Medications:  Scheduled:   . ampicillin-sulbactam (UNASYN) IV  3 g Intravenous Q6H  . aspirin      . aspirin EC  81 mg Oral Daily  . enoxaparin (LOVENOX)  injection  40 mg Subcutaneous Q24H  . furosemide  20 mg Intravenous Once  . simvastatin  40 mg Oral QHS  . vancomycin  1,500 mg Intravenous Q12H  . venlafaxine XR  75 mg Oral Q breakfast  . DISCONTD: vancomycin  2,500 mg Intravenous Once    Social History:  reports that he has never smoked. He has never used smokeless tobacco. He reports that he drinks alcohol. He reports that he does not use illicit drugs.  Family History  Problem Relation Age of Onset  . Hypertension Mother   . Heart disease Mother   . Heart disease Father   . Stroke Father   . Heart disease Brother   . Coronary artery disease Brother   . Diabetes Brother   . Cancer Maternal Aunt     breast  . Cancer Maternal Uncle     lung  . Heart disease Brother   . Coronary artery disease Brother     General ROS: loose BM, normal urination, SOB which is at baseline with occas cough. See HPI.   Blood pressure 150/87, pulse 109, temperature 99.3 F (37.4 C), temperature source Oral, resp. rate 22, height 5' 6.93" (1.7 m), weight 195.954 kg (432 lb), SpO2 96.00%. General appearance: alert, cooperative and no distress Eyes: negative findings: conjunctivae  and sclerae normal and pupils equal, round, reactive to light and accomodation Throat: normal findings: oropharynx pink & moist without lesions or evidence of thrush Neck: no adenopathy and plethoric Lungs: clear to auscultation bilaterally Heart: regular rate and rhythm Abdomen: normal findings: bowel sounds normal and soft, non-tender Extremities: edema nonpitting Skin: LLE with blisetering, crusting, clear fluid weeping from wounds. tender near blisters. erythema is at previous demarcation sites.    Results for orders placed during the hospital encounter of 02/09/12 (from the past 48 hour(s))  SODIUM, URINE, RANDOM     Status: Normal   Collection Time   02/10/12 10:00 PM      Component Value Range Comment   Sodium, Ur 50     CREATININE, URINE, RANDOM     Status:  Normal   Collection Time   02/10/12 10:00 PM      Component Value Range Comment   Creatinine, Urine 125.00     CBC     Status: Abnormal   Collection Time   02/11/12  5:25 AM      Component Value Range Comment   WBC 21.2 (*) 4.0 - 10.5 K/uL    RBC 4.45  4.22 - 5.81 MIL/uL    Hemoglobin 13.6  13.0 - 17.0 g/dL    HCT 45.4  09.8 - 11.9 %    MCV 88.3  78.0 - 100.0 fL    MCH 30.6  26.0 - 34.0 pg    MCHC 34.6  30.0 - 36.0 g/dL    RDW 14.7  82.9 - 56.2 %    Platelets 363  150 - 400 K/uL   BASIC METABOLIC PANEL     Status: Abnormal   Collection Time   02/11/12  5:25 AM      Component Value Range Comment   Sodium 131 (*) 135 - 145 mEq/L    Potassium 4.2  3.5 - 5.1 mEq/L    Chloride 95 (*) 96 - 112 mEq/L    CO2 24  19 - 32 mEq/L    Glucose, Bld 116 (*) 70 - 99 mg/dL    BUN 22  6 - 23 mg/dL    Creatinine, Ser 1.30  0.50 - 1.35 mg/dL DELTA CHECK NOTED   Calcium 8.9  8.4 - 10.5 mg/dL    GFR calc non Af Amer 63 (*) >90 mL/min    GFR calc Af Amer 73 (*) >90 mL/min   MAGNESIUM     Status: Abnormal   Collection Time   02/11/12  5:25 AM      Component Value Range Comment   Magnesium 2.6 (*) 1.5 - 2.5 mg/dL   OSMOLALITY     Status: Normal   Collection Time   02/11/12  5:25 AM      Component Value Range Comment   Osmolality 286  275 - 300 mOsm/kg   TSH     Status: Normal   Collection Time   02/11/12  5:25 AM      Component Value Range Comment   TSH 2.503  0.350 - 4.500 uIU/mL   PROCALCITONIN     Status: Normal   Collection Time   02/11/12 10:30 AM      Component Value Range Comment   Procalcitonin 1.55     CBC     Status: Abnormal   Collection Time   02/12/12  7:05 AM      Component Value Range Comment   WBC 17.5 (*) 4.0 - 10.5 K/uL    RBC 4.56  4.22 - 5.81 MIL/uL    Hemoglobin 14.3  13.0 - 17.0 g/dL    HCT 29.5  62.1 - 30.8 %    MCV 88.4  78.0 - 100.0 fL    MCH 31.4  26.0 - 34.0 pg    MCHC 35.5  30.0 - 36.0 g/dL    RDW 65.7  84.6 - 96.2 %    Platelets 281  150 - 400 K/uL     VANCOMYCIN, TROUGH     Status: Normal   Collection Time   02/12/12  9:25 AM      Component Value Range Comment   Vancomycin Tr 12.5  10.0 - 20.0 ug/mL       Component Value Date/Time   SDES URINE, CLEAN CATCH 02/09/2012 1851   SPECREQUEST NONE 02/09/2012 1851   CULT NO GROWTH 02/09/2012 1851   REPTSTATUS 02/11/2012 FINAL 02/09/2012 1851   No results found. Recent Results (from the past 240 hour(s))  URINE CULTURE     Status: Normal   Collection Time   02/07/12 10:42 AM      Component Value Range Status Comment   Colony Count NO GROWTH   Final    Organism ID, Bacteria NO GROWTH   Final   MRSA PCR SCREENING     Status: Normal   Collection Time   02/09/12  3:40 PM      Component Value Range Status Comment   MRSA by PCR NEGATIVE  NEGATIVE Final   URINE CULTURE     Status: Normal   Collection Time   02/09/12  6:51 PM      Component Value Range Status Comment   Specimen Description URINE, CLEAN CATCH   Final    Special Requests NONE   Final    Culture  Setup Time 02/09/2012 19:20   Final    Colony Count NO GROWTH   Final    Culture NO GROWTH   Final    Report Status 02/11/2012 FINAL   Final       02/12/2012, 1:04 PM     LOS: 3 days

## 2012-02-12 NOTE — Progress Notes (Signed)
ANTIBIOTIC CONSULT NOTE - FOLLOW UP  Pharmacy Consult for Vancomycin/Unasyn Indication: Extensive RLE Cellulitis  No Known Allergies  Patient Measurements: Height: 5' 6.93" (170 cm) Weight: 432 lb (195.954 kg) IBW/kg (Calculated) : 65.94   Vital Signs: Temp: 99.3 F (37.4 C) (07/14 0614) Temp src: Oral (07/14 0614) BP: 150/87 mmHg (07/14 0614) Pulse Rate: 109  (07/14 0614) Intake/Output from previous day: 07/13 0701 - 07/14 0700 In: 480 [P.O.:480] Out: -  Intake/Output from this shift: Total I/O In: 240 [P.O.:240] Out: -   Labs:  Basename 02/12/12 0705 02/11/12 0525 02/10/12 2200 02/10/12 0600 02/09/12 1527  WBC 17.5* 21.2* -- 21.9* --  HGB 14.3 13.6 -- 13.8 --  PLT 281 363 -- 247 --  LABCREA -- -- 125.00 -- --  CREATININE -- 1.25 -- 1.96* 2.64*   Estimated Creatinine Clearance: 111.4 ml/min (by C-G formula based on Cr of 1.25).  Basename 02/12/12 0925  VANCOTROUGH 12.5  VANCOPEAK --  Drue Dun --  GENTTROUGH --  GENTPEAK --  GENTRANDOM --  TOBRATROUGH --  TOBRAPEAK --  TOBRARND --  AMIKACINPEAK --  AMIKACINTROU --  AMIKACIN --     Microbiology: Recent Results (from the past 720 hour(s))  URINE CULTURE     Status: Normal   Collection Time   02/07/12 10:42 AM      Component Value Range Status Comment   Colony Count NO GROWTH   Final    Organism ID, Bacteria NO GROWTH   Final   MRSA PCR SCREENING     Status: Normal   Collection Time   02/09/12  3:40 PM      Component Value Range Status Comment   MRSA by PCR NEGATIVE  NEGATIVE Final   URINE CULTURE     Status: Normal   Collection Time   02/09/12  6:51 PM      Component Value Range Status Comment   Specimen Description URINE, CLEAN CATCH   Final    Special Requests NONE   Final    Culture  Setup Time 02/09/2012 19:20   Final    Colony Count NO GROWTH   Final    Culture NO GROWTH   Final    Report Status 02/11/2012 FINAL   Final     Anti-infectives     Start     Dose/Rate Route Frequency Ordered  Stop   02/10/12 1800   vancomycin (VANCOCIN) 2,000 mg in sodium chloride 0.9 % 500 mL IVPB  Status:  Discontinued        2,000 mg 250 mL/hr over 120 Minutes Intravenous Every 24 hours 02/09/12 1648 02/10/12 0807   02/10/12 1000   vancomycin (VANCOCIN) 1,500 mg in sodium chloride 0.9 % 500 mL IVPB        1,500 mg 250 mL/hr over 120 Minutes Intravenous Every 12 hours 02/10/12 0807     02/09/12 1800   vancomycin (VANCOCIN) 2,500 mg in sodium chloride 0.9 % 500 mL IVPB  Status:  Discontinued        2,500 mg 250 mL/hr over 120 Minutes Intravenous  Once 02/09/12 1648 02/11/12 1345   02/09/12 1800   Ampicillin-Sulbactam (UNASYN) 3 g in sodium chloride 0.9 % 100 mL IVPB        3 g 100 mL/hr over 60 Minutes Intravenous Every 6 hours 02/09/12 1648            Assessment: Pt is a 56 yo M patient who was admitted for extensive RLE cellulitis. The WBC is elevated at  17.5 and the patient is currently on a vanc/unasyn combination. Renal function is improving with the Scr trending down from 1.96 to 1.25. Appropriate vancomycin trough of 12.5 from 7/14 based on indication of cellulitis(goal 10-15 mg/L)  Goal of Therapy:  Vancomycin trough level 10-15 mcg/ml  Plan:  -Continue vancomycin to 1500mg  IV q12h -No Unasyn changes needed -Monitor renal function  Abran Duke, PharmD Clinical Pharmacist Phone: [phone] Pager: [phone] 02/12/2012 10:57 AM

## 2012-02-12 NOTE — Progress Notes (Signed)
[facility] PROGRESS NOTE  Blake Garcia YNW:[id number] DOB: 1956-02-03 DOA: 02/09/2012 PCP: Kerby Nora, MD  Assessment/Plan: Principal Problem:  *Cellulitis of right leg Active Problems:  HYPERLIPIDEMIA  Morbid obesity  HYPERTENSION  SLEEP APNEA CHRONIC STATIS DERMATITIS.  1. Extensive cellulitis of the right leg from the ankle up to the knee: failed outpatient po therapy. There are blebs and some fluctuation of the posterior aspect of the right leg. Area is erythematous, indurated, tender and seeping clear fluid. X ray of the right LE shows soft tissue swelling and venous duplex did not show any svt or DVT. Surgery input appreciated, no indication of any procedure  patient on iv vanco and iv unasyn. Spiking low grade fevers on antibiotics. cxr negative for pneumonia, urine cultures negative. Will call ID consult for further recommendations. Elevate the leg and also started him on low dose lasix to see if the leg edema improves.  2. Hypertension: bp slightly high. Will restart home BP meds. VANCO trough is 13.  3. Hyperlipidemia: resume home simvastatin.  4. Sleep apnea: continue CPAP at night.  5. Acute renal failure:  Resolved,, his last normal creatinine is 1 last year. US kidney does not show hydronephrosis. Resolved with  Gentle hydration.  6. Leukocytosis: probably from the cellulitis. Improving.  7. Hypokalemia and hyponatremia:  Probably from dehydration, will start him on gentle hydration and replete K as needed. Magnesium level is normal.  8. Morbid obesity 9. Chronic statis dermatitis: wound care. 10. Bilateral pedal edema and shortness of breath: will get an echo to e valuate for left ventricular systolic function. 11. Fever: pt continues to spike fevers on antibiotics, leukocytosis is improving, urine cultures negative and blood cultures ordered, cxr does not show pneumonia. Venous dopplers negative for DVT. ? Probably secondary to atelectasis.   DVT prophylaxis  Lovenox.  Brief narrative:  56 year old gentleman came in from doctors office as his cellulitis hasn't been improving despite being on oral antibiotics. He reports some cellulitis on the RLE about an inch long with some erythematous streaks on the RLE on 7/4. He went to pcp got a prescrption for bactrim on 7/9, but today he saw that his leg was draining clear fluid and went to see his PCP, and he was referred to [name] for admission for IV antibiotics for worsening cellulitis.  Consultants:  Surgery consult  Wound care consult.  Procedures:  cxr   XR of the right leg  US kidney.  Antibiotics:  Vancomycin and unasyn day 4  HPI/Subjective: Much better .   Objective: Filed Vitals:   02/11/12 2034 02/11/12 2200 02/11/12 2206 02/12/12 0614  BP: 137/79   150/87  Pulse: 110  102 109  Temp: 100.5 F (38.1 C) 99 F (37.2 C)  99.3 F (37.4 C)  TempSrc: Oral Oral  Oral  Resp: 21  20 22   Height:      Weight:      SpO2: 93%  93% 96%    Intake/Output Summary (Last 24 hours) at 02/12/12 1142 Last data filed at 02/12/12 0827  Gross per 24 hour  Intake    480 ml  Output      0 ml  Net    480 ml    Exam:   General:  Alert afebrile comfortable  Cardiovascular: s1s2 heard, no MRG  Respiratory: Decreased at bases  Abdomen: soft NT ND BS+.  Extremities: RLE, erythema has improved.   Data Reviewed: Basic Metabolic Panel:  Lab 02/11/12 6578 02/10/12 0600 02/09/12  1527  NA 131* 130* 132*  K 4.2 2.8* 3.1*  CL 95* 91* 92*  CO2 24 24 24   GLUCOSE 116* 119* 116*  BUN 22 31* 32*  CREATININE 1.25 1.96* 2.64*  CALCIUM 8.9 8.8 8.7  MG 2.6* -- 2.3  PHOS -- -- 2.7   Liver Function Tests:  Lab 02/09/12 1527  AST 17  ALT 15  ALKPHOS 100  BILITOT 0.7  PROT 7.2  ALBUMIN 2.5*   No results found for this basename: LIPASE:5,AMYLASE:5 in the last 168 hours No results found for this basename: AMMONIA:5 in the last 168 hours CBC:  Lab 02/12/12 0705 02/11/12 0525 02/10/12  0600 02/09/12 1527  WBC 17.5* 21.2* 21.9* 25.1*  NEUTROABS -- -- -- 22.3*  HGB 14.3 13.6 13.8 13.6  HCT 40.3 39.3 38.5* 38.7*  MCV 88.4 88.3 86.1 85.6  PLT 281 363 247 223   Cardiac Enzymes: No results found for this basename: CKTOTAL:5,CKMB:5,CKMBINDEX:5,TROPONINI:5 in the last 168 hours BNP (last 3 results) No results found for this basename: PROBNP:3 in the last 8760 hours CBG: No results found for this basename: GLUCAP:5 in the last 168 hours  Recent Results (from the past 240 hour(s))  URINE CULTURE     Status: Normal   Collection Time   02/07/12 10:42 AM      Component Value Range Status Comment   Colony Count NO GROWTH   Final    Organism ID, Bacteria NO GROWTH   Final   MRSA PCR SCREENING     Status: Normal   Collection Time   02/09/12  3:40 PM      Component Value Range Status Comment   MRSA by PCR NEGATIVE  NEGATIVE Final   URINE CULTURE     Status: Normal   Collection Time   02/09/12  6:51 PM      Component Value Range Status Comment   Specimen Description URINE, CLEAN CATCH   Final    Special Requests NONE   Final    Culture  Setup Time 02/09/2012 19:20   Final    Colony Count NO GROWTH   Final    Culture NO GROWTH   Final    Report Status 02/11/2012 FINAL   Final      Studies: Dg Tibia/fibula Right  02/09/2012  *RADIOLOGY REPORT*  Clinical Data: 56 year old male cellulitis swelling and pain.  RIGHT TIBIA AND FIBULA - 2 VIEW  Comparison: None.  Findings: Diffuse.  Diffuse soft tissue swelling stranding in the visualized right lower extremity. Bone mineralization is within normal limits.  Right tibia and fibula appear intact.  Degenerative changes at the right knee.  Less pronounced degenerative changes at the right ankle.  No subcutaneous gas. No radiopaque foreign body identified.  IMPRESSION: No acute osseous abnormality identified about the right tib-fib. Soft tissue swelling and stranding.  Original Report Authenticated By: Harley Hallmark, M.D.    Scheduled  Meds:    . ampicillin-sulbactam (UNASYN) IV  3 g Intravenous Q6H  . aspirin      . aspirin EC  81 mg Oral Daily  . enoxaparin (LOVENOX) injection  40 mg Subcutaneous Q24H  . furosemide  20 mg Intravenous Once  . simvastatin  40 mg Oral QHS  . vancomycin  1,500 mg Intravenous Q12H  . venlafaxine XR  75 mg Oral Q breakfast  . DISCONTD: vancomycin  2,500 mg Intravenous Once   Continuous Infusions:     Mahrukh Seguin [facility] Pager [phone]  If 7PM-7AM, please contact  night-coverage [email or web address] Password [email or web address] 02/12/2012, 11:42 AM   LOS: 3 days

## 2012-02-12 NOTE — Progress Notes (Signed)
  Echocardiogram 2D Echocardiogram has been performed.  Blake Garcia FRANCES 02/12/2012, 4:34 PM

## 2012-02-13 DIAGNOSIS — I5033 Acute on chronic diastolic (congestive) heart failure: Secondary | ICD-10-CM | POA: Diagnosis present

## 2012-02-13 DIAGNOSIS — I5030 Unspecified diastolic (congestive) heart failure: Secondary | ICD-10-CM | POA: Diagnosis present

## 2012-02-13 LAB — BASIC METABOLIC PANEL
CO2: 26 mEq/L (ref 19–32)
Chloride: 95 mEq/L — ABNORMAL LOW (ref 96–112)
Creatinine, Ser: 0.84 mg/dL (ref 0.50–1.35)
Potassium: 3.8 mEq/L (ref 3.5–5.1)
Sodium: 134 mEq/L — ABNORMAL LOW (ref 135–145)

## 2012-02-13 LAB — CBC
HCT: 40.7 % (ref 39.0–52.0)
Hemoglobin: 14.1 g/dL (ref 13.0–17.0)
MCV: 88.9 fL (ref 78.0–100.0)
RBC: 4.58 MIL/uL (ref 4.22–5.81)
WBC: 12.2 10*3/uL — ABNORMAL HIGH (ref 4.0–10.5)

## 2012-02-13 MED ORDER — POTASSIUM CHLORIDE CRYS ER 20 MEQ PO TBCR
20.0000 meq | EXTENDED_RELEASE_TABLET | Freq: Every day | ORAL | Status: DC
  Administered 2012-02-13: 20 meq via ORAL
  Filled 2012-02-13 (×2): qty 1

## 2012-02-13 MED ORDER — FUROSEMIDE 20 MG PO TABS
20.0000 mg | ORAL_TABLET | Freq: Every day | ORAL | Status: DC
  Administered 2012-02-13 – 2012-02-14 (×2): 20 mg via ORAL
  Filled 2012-02-13 (×4): qty 1

## 2012-02-13 MED ORDER — DIPHENHYDRAMINE HCL 12.5 MG/5ML PO ELIX
12.5000 mg | ORAL_SOLUTION | Freq: Three times a day (TID) | ORAL | Status: DC | PRN
  Administered 2012-02-13 – 2012-02-21 (×14): 12.5 mg via ORAL
  Filled 2012-02-13 (×16): qty 5

## 2012-02-13 NOTE — Consult Note (Addendum)
WOC follow up New consult requested for LE weeping wounds.  This WOC saw pt last week and started absorbant foam dressing for two small open areas that are draining serous fluid. Since my first assessment of the pt he reports the foam dressings were overcome with drainage within 24 hours and the legs were more blistered.  I have removed the dressings today and the entire LE is covered with large serous filled blisters, much worse than when I saw pt last.  RLE anterior: 19cm x 17cm fluid filled blister, still seems to be oozing from the same pinpoint area. RLE posterior: 19cm x 25cm fluid filled blister, do not see any active oozing sites but seems to be weeping all across this area.    Will order silver hydrofiber dressings to cover the blistered areas to provide more absorption and provide antimicrobial effects, wrap with kerlix for the extra absorption and keep ACE in place for very low level compression. Erythema does seem to be some improved from previous sites marked.  Left leg unwrapped to allow for dressings to arrive to floor from SPD.   WOC will follow along to see if silver dressings will improve status of the wounds.  Ko Bardon [employer], [place] [phone]  12:12 Applied silver hydrofiber to all visibly weeping areas and cover with silver non adherent dressing to other intact blister area, cover with 2 layers of kerlix and ACE wrap.  Elevated on 2 pillows.

## 2012-02-13 NOTE — Progress Notes (Signed)
[facility] PROGRESS NOTE  Blake Garcia ZOX:[id number] DOB: [birth year] DOA: 02/09/2012 PCP: Kerby Nora, MD  Assessment/Plan: Principal Problem:  *Cellulitis of right leg Active Problems:  HYPERLIPIDEMIA  Morbid obesity  HYPERTENSION  SLEEP APNEA CHRONIC STATIS DERMATITIS.  1. Extensive cellulitis of the right leg from the ankle up to the knee: failed outpatient po therapy. There are blisters and clear fluid oozing from the affected area. Area is erythematous, indurated, tender and seeping clear fluid. X ray of the right LE shows soft tissue swelling and venous duplex did not show any svt or DVT. Surgery input appreciated, no indication of any procedure  patient on iv vanco  Day 5, unasyn was stopped by ID. Pt has been spiking fever until yesterday on antibiotics, ID consult was obtained. Recommended c diff pcr which came back negative, Hiv antibody was negative. cxr negative for pneumonia, urine cultures negative. Elevate the leg .VANCO trough is 13.  2. Hypertension: bp slightly high. Will restart home BP meds.  3. Hyperlipidemia: resume home simvastatin.  4. Sleep apnea: continue CPAP at night.  5. Acute renal failure:  Resolved,, his last normal creatinine is 1 last year. US kidney does not show hydronephrosis. Resolved with  Gentle hydration.  6. Leukocytosis: probably from the cellulitis. Improving.  7. Hypokalemia and hyponatremia:  Probably from dehydration, will start him on gentle hydration and replete K as needed. Magnesium level is normal.  8. Morbid obesity: BMI >69. 9. Chronic statis dermatitis: wound care. 10. Bilateral pedal edema and shortness of breath: Echo showed good left ventricular systolic function, but moderate diastolic dysfunction. Will start him on low dose lasix. And he will need further cardiac evaluation as outpatient.  11. Fever: no fever over the last 24 hours. , leukocytosis is improving, urine cultures negative and blood cultures ordered, cxr does  not show pneumonia. Venous dopplers negative for DVT. HIV  Antibody negative, C diff PCR negative ? Probably secondary to atelectasis.   DVT prophylaxis Lovenox.  Brief narrative:  56 year old gentleman came in from doctors office as his cellulitis hasn't been improving despite being on oral antibiotics. He reports some cellulitis on the RLE about an inch long with some erythematous streaks on the RLE on 7/4. He went to pcp got a prescrption for bactrim on 7/9, but today he saw that his leg was draining clear fluid and went to see his PCP, and he was referred to [name] for admission for IV antibiotics for worsening cellulitis.  Consultants:  Surgery consult  Wound care consult.  Procedures:  cxr   XR of the right leg  US kidney.  Antibiotics:  Vancomycin day 5.  HPI/Subjective: Much better .   Objective: Filed Vitals:   02/12/12 1505 02/12/12 2241 02/13/12 0500 02/13/12 1415  BP: 165/96 153/93 154/87 151/93  Pulse: 100 104 104 97  Temp: 100.1 F (37.8 C) 98.6 F (37 C) 98.4 F (36.9 C) 98.1 F (36.7 C)  TempSrc: Oral Oral Oral   Resp: 20 22 20 20   Height:      Weight:      SpO2: 96% 94% 93% 94%    Intake/Output Summary (Last 24 hours) at 02/13/12 1505 Last data filed at 02/13/12 1415  Gross per 24 hour  Intake   1560 ml  Output      0 ml  Net   1560 ml    Exam:   General:  Alert afebrile comfortable  Cardiovascular: s1s2 heard, no MRG  Respiratory: Decreased at bases  Abdomen: soft NT ND BS+.  Extremities: RLE, erythema has improved.   Data Reviewed: Basic Metabolic Panel:  Lab 02/13/12 4098 02/12/12 1250 02/11/12 0525 02/10/12 0600 02/09/12 1527  NA 134* 131* 131* 130* 132*  K 3.8 3.5 4.2 2.8* 3.1*  CL 95* 92* 95* 91* 92*  CO2 26 30 24 24 24   GLUCOSE 173* 141* 116* 119* 116*  BUN 9 10 22  31* 32*  CREATININE 0.84 0.89 1.25 1.96* 2.64*  CALCIUM 8.7 8.8 8.9 8.8 8.7  MG -- -- 2.6* -- 2.3  PHOS -- -- -- -- 2.7   Liver Function Tests:  Lab  02/09/12 1527  AST 17  ALT 15  ALKPHOS 100  BILITOT 0.7  PROT 7.2  ALBUMIN 2.5*   No results found for this basename: LIPASE:5,AMYLASE:5 in the last 168 hours No results found for this basename: AMMONIA:5 in the last 168 hours CBC:  Lab 02/13/12 0920 02/12/12 0705 02/11/12 0525 02/10/12 0600 02/09/12 1527  WBC 12.2* 17.5* 21.2* 21.9* 25.1*  NEUTROABS -- -- -- -- 22.3*  HGB 14.1 14.3 13.6 13.8 13.6  HCT 40.7 40.3 39.3 38.5* 38.7*  MCV 88.9 88.4 88.3 86.1 85.6  PLT 280 281 363 247 223   Cardiac Enzymes: No results found for this basename: CKTOTAL:5,CKMB:5,CKMBINDEX:5,TROPONINI:5 in the last 168 hours BNP (last 3 results) No results found for this basename: PROBNP:3 in the last 8760 hours CBG: No results found for this basename: GLUCAP:5 in the last 168 hours  Recent Results (from the past 240 hour(s))  URINE CULTURE     Status: Normal   Collection Time   02/07/12 10:42 AM      Component Value Range Status Comment   Colony Count NO GROWTH   Final    Organism ID, Bacteria NO GROWTH   Final   MRSA PCR SCREENING     Status: Normal   Collection Time   02/09/12  3:40 PM      Component Value Range Status Comment   MRSA by PCR NEGATIVE  NEGATIVE Final   URINE CULTURE     Status: Normal   Collection Time   02/09/12  6:51 PM      Component Value Range Status Comment   Specimen Description URINE, CLEAN CATCH   Final    Special Requests NONE   Final    Culture  Setup Time 02/09/2012 19:20   Final    Colony Count NO GROWTH   Final    Culture NO GROWTH   Final    Report Status 02/11/2012 FINAL   Final   CULTURE, BLOOD (SINGLE)     Status: Normal (Preliminary result)   Collection Time   02/12/12 12:50 PM      Component Value Range Status Comment   Specimen Description BLOOD RIGHT ARM   Final    Special Requests BOTTLES DRAWN AEROBIC AND ANAEROBIC 10CC   Final    Culture  Setup Time 02/12/2012 19:22   Final    Culture     Final    Value:        BLOOD CULTURE RECEIVED NO GROWTH TO  DATE CULTURE WILL BE HELD FOR 5 DAYS BEFORE ISSUING A FINAL NEGATIVE REPORT   Report Status PENDING   Incomplete   CLOSTRIDIUM DIFFICILE BY PCR     Status: Normal   Collection Time   02/12/12  5:25 PM      Component Value Range Status Comment   C difficile by pcr NEGATIVE  NEGATIVE Final  Studies: Dg Tibia/fibula Right  02/09/2012  *RADIOLOGY REPORT*  Clinical Data: 56 year old male cellulitis swelling and pain.  RIGHT TIBIA AND FIBULA - 2 VIEW  Comparison: None.  Findings: Diffuse.  Diffuse soft tissue swelling stranding in the visualized right lower extremity. Bone mineralization is within normal limits.  Right tibia and fibula appear intact.  Degenerative changes at the right knee.  Less pronounced degenerative changes at the right ankle.  No subcutaneous gas. No radiopaque foreign body identified.  IMPRESSION: No acute osseous abnormality identified about the right tib-fib. Soft tissue swelling and stranding.  Original Report Authenticated By: Ulla Potash III, M.D.    Scheduled Meds:    . amLODipine  10 mg Oral Daily  . aspirin EC  81 mg Oral Daily  . enoxaparin (LOVENOX) injection  100 mg Subcutaneous QHS  . enoxaparin (LOVENOX) injection  60 mg Subcutaneous NOW  . simvastatin  40 mg Oral QHS  . vancomycin  1,500 mg Intravenous Q12H  . venlafaxine XR  75 mg Oral Q breakfast  . DISCONTD: enoxaparin (LOVENOX) injection  40 mg Subcutaneous Q24H   Continuous Infusions:     Atarah Cadogan [facility] Pager [phone]  If 7PM-7AM, please contact night-coverage [email or web address] Password [email or web address] 02/13/2012, 3:05 PM   LOS: 4 days

## 2012-02-13 NOTE — Progress Notes (Signed)
Utilization review completed. Jlee Harkless, RN, BSN. 

## 2012-02-14 DIAGNOSIS — I5030 Unspecified diastolic (congestive) heart failure: Secondary | ICD-10-CM

## 2012-02-14 LAB — CBC
HCT: 38.3 % — ABNORMAL LOW (ref 39.0–52.0)
Hemoglobin: 12.9 g/dL — ABNORMAL LOW (ref 13.0–17.0)
MCH: 29.9 pg (ref 26.0–34.0)
MCHC: 33.7 g/dL (ref 30.0–36.0)
MCV: 88.7 fL (ref 78.0–100.0)
RBC: 4.32 MIL/uL (ref 4.22–5.81)

## 2012-02-14 LAB — BASIC METABOLIC PANEL
BUN: 9 mg/dL (ref 6–23)
CO2: 28 mEq/L (ref 19–32)
Calcium: 8.5 mg/dL (ref 8.4–10.5)
GFR calc non Af Amer: >90 mL/min (ref >90)
Glucose, Bld: 117 mg/dL — ABNORMAL HIGH (ref 70–99)

## 2012-02-14 MED ORDER — OXYCODONE HCL 5 MG PO TABS
5.0000 mg | ORAL_TABLET | Freq: Once | ORAL | Status: AC
  Administered 2012-02-14: 5 mg via ORAL

## 2012-02-14 MED ORDER — OXYCODONE HCL 5 MG PO TABS
5.0000 mg | ORAL_TABLET | ORAL | Status: DC | PRN
  Administered 2012-02-14 – 2012-02-21 (×22): 10 mg via ORAL
  Filled 2012-02-14 (×2): qty 2
  Filled 2012-02-14: qty 1
  Filled 2012-02-14 (×24): qty 2

## 2012-02-14 MED ORDER — VANCOMYCIN HCL 1000 MG IV SOLR
1500.0000 mg | Freq: Two times a day (BID) | INTRAVENOUS | Status: DC
  Administered 2012-02-15 – 2012-02-21 (×14): 1500 mg via INTRAVENOUS
  Filled 2012-02-14 (×16): qty 1500

## 2012-02-14 NOTE — Consult Note (Signed)
WOC follow up Reassessed pt today after I started silver antimicrobial dressings yesterday.  Pt continues to have large fluid filled blisters on the RLE, extends almost the entire circumference of the calf/tibial area. New blistered area lateral leg near the knee. The erythema does seem to be improving based on the markings on the leg but the intensity still present where the leg is blistered.  Continues to ooze large amounts of serous fluid from 3-4 areas within the blistered areas. The blisters do appear to be serous filled and the drainage is not purulent.  Pretibial blisters seem to be decreasing in size but the posterior area is unchanged. Concerning that the blisters are not improving with the decrease of the erythema.  Pt concerned as well on the care he will need for this area once he discharges to home.    I would recommend continued use of the silver absorbant dressings and top with non adherent dressings to keep gauze wraps from sticking to the leg.  Today the drainage had not saturated completely thru the topper dressings. Would recommend HHRN to wrap legs every other day, with continued use of silver absorbant for drainage control and antimicrobial effects.    WOC will continue to monitor and assist with management of LE wounds. Verlin Duke [place], [place] [phone]

## 2012-02-14 NOTE — Progress Notes (Signed)
[facility] PROGRESS NOTE  Blake Garcia ZOX:[id number] DOB: 04-Nov-1955 DOA: 02/09/2012 PCP: Kerby Nora, MD  Interim history:   56 year old gentleman came in from doctors office as his cellulitis hasn't been improving despite being on oral antibiotics. He reports some cellulitis on the RLE about an inch long with some erythematous streaks on 7/4. He went to pcp got a prescrption for bactrim on 7/9, he reports using the antibiotic for 2 days, but his cellulitis worsened with multiple blisters and was draining clear fluid. He went to see his PCP, and he was referred to [name] for admission for IV antibiotics for worsening cellulitis. He was started on IV vancomycin and IV Unasyn till 7/14, but he continued to spike fevers on antibiotics. ID consult was obtained, with recommendations to stop Unasyn.   Assessment/Plan: Principal Problem:  *Cellulitis of right leg Active Problems:  HYPERLIPIDEMIA  Morbid obesity  HYPERTENSION  SLEEP APNEA CHRONIC STATIS DERMATITIS.  1. Extensive cellulitis of the right leg from the ankle up to the knee: failed outpatient po therapy. There are blisters and clear fluid oozing from the affected area. Area is erythematous, indurated, tender and seeping clear fluid. X ray of the right LE shows soft tissue swelling and venous duplex did not show any svt or DVT. Surgery input appreciated, no indication of any procedure  patient on iv vanco  Day 6, unasyn was stopped by ID. Pt has been spiking fever until 7/14 on antibiotics, ID consult was obtained. Recommended c diff pcr which came back negative, Hiv antibody was negative. cxr negative for pneumonia, urine cultures negative.VANCO trough is 13. We will continue vancomycin for 1 more day and plan to transition to oral antibiotics for 7 more days on discharge.  2. Hypertension: better controlled. Resumed home medication.  3. Hyperlipidemia: resume home simvastatin.  4. Sleep apnea: continue CPAP at night.  5. Acute  renal failure:  Resolved,, his last normal creatinine is 1 last year. US kidney does not show hydronephrosis. Resolved with  Gentle hydration.  6. Leukocytosis: probably from the cellulitis. Improving.  7. Hypokalemia and hyponatremia:  Probably from dehydration, will start him on gentle hydration and replete K as needed. Magnesium level is normal.  8. Morbid obesity: BMI >69. 9. Chronic statis dermatitis: wound care and HHRN on discharge for daily dressing change. 10. Bilateral pedal edema and shortness of breath: Echo showed good left ventricular systolic function, but moderate diastolic dysfunction. Will start him on low dose lasix. And he will need further cardiac evaluation as outpatient.  11. Fever: no fever over the last 48 hours. , leukocytosis is improving, urine cultures negative and blood cultures ordered, cxr does not show pneumonia. Venous dopplers negative for DVT. HIV  Antibody negative, C diff PCR negative.  DVT prophylaxis Lovenox.    Consultants:  Surgery consult  Wound care consult.  Id consult  Procedures:  cxr   XR of the right leg  US kidney.  Antibiotics:  Vancomycin day 6.  HPI/Subjective: Much better .   Objective: Filed Vitals:   02/14/12 0025 02/14/12 0608 02/14/12 0800 02/14/12 1300  BP:  151/81  124/70  Pulse: 99 105  90  Temp:  99.7 F (37.6 C) 98.8 F (37.1 C) 99.3 F (37.4 C)  TempSrc:  Oral Oral Axillary  Resp: 20 25  18   Height:      Weight:      SpO2: 94% 94%  96%    Intake/Output Summary (Last 24 hours) at 02/14/12 1951 Last  data filed at 02/14/12 1300  Gross per 24 hour  Intake    920 ml  Output      0 ml  Net    920 ml    Exam:   General:  Alert afebrile comfortable  Cardiovascular: s1s2 heard, no MRG  Respiratory: Decreased at bases  Abdomen: soft NT ND BS+.  Extremities: RLE, erythema has improved. Wrapped.   Data Reviewed: Basic Metabolic Panel:  Lab 02/14/12 1610 02/13/12 0920 02/12/12 1250 02/11/12  0525 02/10/12 0600 02/09/12 1527  NA 132* 134* 131* 131* 130* --  K 5.0 3.8 3.5 4.2 2.8* --  CL 95* 95* 92* 95* 91* --  CO2 28 26 30 24 24  --  GLUCOSE 117* 173* 141* 116* 119* --  BUN 9 9 10 22  31* --  CREATININE 0.97 0.84 0.89 1.25 1.96* --  CALCIUM 8.5 8.7 8.8 8.9 8.8 --  MG -- -- -- 2.6* -- 2.3  PHOS -- -- -- -- -- 2.7   Liver Function Tests:  Lab 02/09/12 1527  AST 17  ALT 15  ALKPHOS 100  BILITOT 0.7  PROT 7.2  ALBUMIN 2.5*   No results found for this basename: LIPASE:5,AMYLASE:5 in the last 168 hours No results found for this basename: AMMONIA:5 in the last 168 hours CBC:  Lab 02/14/12 0615 02/13/12 0920 02/12/12 0705 02/11/12 0525 02/10/12 0600 02/09/12 1527  WBC 13.5* 12.2* 17.5* 21.2* 21.9* --  NEUTROABS -- -- -- -- -- 22.3*  HGB 12.9* 14.1 14.3 13.6 13.8 --  HCT 38.3* 40.7 40.3 39.3 38.5* --  MCV 88.7 88.9 88.4 88.3 86.1 --  PLT 261 280 281 363 247 --   Cardiac Enzymes: No results found for this basename: CKTOTAL:5,CKMB:5,CKMBINDEX:5,TROPONINI:5 in the last 168 hours BNP (last 3 results) No results found for this basename: PROBNP:3 in the last 8760 hours CBG: No results found for this basename: GLUCAP:5 in the last 168 hours  Recent Results (from the past 240 hour(s))  URINE CULTURE     Status: Normal   Collection Time   02/07/12 10:42 AM      Component Value Range Status Comment   Colony Count NO GROWTH   Final    Organism ID, Bacteria NO GROWTH   Final   MRSA PCR SCREENING     Status: Normal   Collection Time   02/09/12  3:40 PM      Component Value Range Status Comment   MRSA by PCR NEGATIVE  NEGATIVE Final   URINE CULTURE     Status: Normal   Collection Time   02/09/12  6:51 PM      Component Value Range Status Comment   Specimen Description URINE, CLEAN CATCH   Final    Special Requests NONE   Final    Culture  Setup Time 02/09/2012 19:20   Final    Colony Count NO GROWTH   Final    Culture NO GROWTH   Final    Report Status 02/11/2012 FINAL    Final   CULTURE, BLOOD (SINGLE)     Status: Normal (Preliminary result)   Collection Time   02/12/12 12:50 PM      Component Value Range Status Comment   Specimen Description BLOOD RIGHT ARM   Final    Special Requests BOTTLES DRAWN AEROBIC AND ANAEROBIC 10CC   Final    Culture  Setup Time 02/12/2012 19:22   Final    Culture     Final    Value:  BLOOD CULTURE RECEIVED NO GROWTH TO DATE CULTURE WILL BE HELD FOR 5 DAYS BEFORE ISSUING A FINAL NEGATIVE REPORT   Report Status PENDING   Incomplete   CLOSTRIDIUM DIFFICILE BY PCR     Status: Normal   Collection Time   02/12/12  5:25 PM      Component Value Range Status Comment   C difficile by pcr NEGATIVE  NEGATIVE Final      Studies: Dg Tibia/fibula Right  02/09/2012  *RADIOLOGY REPORT*  Clinical Data: 56 year old male cellulitis swelling and pain.  RIGHT TIBIA AND FIBULA - 2 VIEW  Comparison: None.  Findings: Diffuse.  Diffuse soft tissue swelling stranding in the visualized right lower extremity. Bone mineralization is within normal limits.  Right tibia and fibula appear intact.  Degenerative changes at the right knee.  Less pronounced degenerative changes at the right ankle.  No subcutaneous gas. No radiopaque foreign body identified.  IMPRESSION: No acute osseous abnormality identified about the right tib-fib. Soft tissue swelling and stranding.  Original Report Authenticated By: Ulla Potash III, M.D.    Scheduled Meds:    . amLODipine  10 mg Oral Daily  . aspirin EC  81 mg Oral Daily  . enoxaparin (LOVENOX) injection  100 mg Subcutaneous QHS  . furosemide  20 mg Oral Daily  . oxyCODONE  5 mg Oral Once  . potassium chloride  20 mEq Oral Daily  . simvastatin  40 mg Oral QHS  . vancomycin  1,500 mg Intravenous Q12H  . venlafaxine XR  75 mg Oral Q breakfast  . DISCONTD: vancomycin  1,500 mg Intravenous Q12H   Continuous Infusions:     Markiyah Gahm [facility] Pager [phone]  If 7PM-7AM, please contact  night-coverage [email or web address] Password [email or web address] 02/14/2012, 7:51 PM   LOS: 5 days

## 2012-02-15 MED ORDER — VANCOMYCIN HCL 1000 MG IV SOLR
750.0000 mg | Freq: Once | INTRAVENOUS | Status: AC
  Administered 2012-02-15: 750 mg via INTRAVENOUS
  Filled 2012-02-15: qty 750

## 2012-02-15 MED ORDER — FUROSEMIDE 10 MG/ML IJ SOLN
INTRAMUSCULAR | Status: AC
  Filled 2012-02-15: qty 4

## 2012-02-15 MED ORDER — FUROSEMIDE 10 MG/ML IJ SOLN
40.0000 mg | Freq: Once | INTRAMUSCULAR | Status: AC
  Administered 2012-02-15: 40 mg via INTRAVENOUS

## 2012-02-15 MED ORDER — SODIUM CHLORIDE 0.9 % IJ SOLN
10.0000 mL | INTRAMUSCULAR | Status: DC | PRN
  Administered 2012-02-16 – 2012-02-21 (×5): 10 mL

## 2012-02-15 NOTE — Progress Notes (Signed)
Pt puts himself on and off the cpap machine

## 2012-02-15 NOTE — Care Management Note (Addendum)
    Page 1 of 2   02/21/2012     11:26:20 AM   CARE MANAGEMENT NOTE 02/21/2012  Patient:  SABIN, GIBEAULT   Account Number:  [id number]  Date Initiated:  02/15/2012  Documentation initiated by:  Anette Guarneri  Subjective/Objective Assessment:   Cellulitis right leg     Action/Plan:   home with [facility] services   Anticipated DC Date:  02/21/2012   Anticipated DC Plan:  HOME W HOME HEALTH SERVICES      DC Planning Services  CM consult      Choice offered to / List presented to:  C-1 Patient        HH arranged  HH-1 RN      [facility] agency  [facility]   Status of service:  In process, will continue to follow Medicare Important Message given?  NO (If response is "NO", the following Medicare IM given date fields will be blank) Date Medicare IM given:   Date Additional Medicare IM given:    Discharge Disposition:  HOME W HOME HEALTH SERVICES  Per UR Regulation:  Reviewed for med. necessity/level of care/duration of stay  If discussed at Long Length of Stay Meetings, dates discussed:    Comments:  02/21/12 11:21  Anette Guarneri RN/CM [facility] for dressing changes added to d/c needs, [facility] to provide until patient can be followed at wound care clinic 02/29/12   02/19/13 15:32, Anette Guarneri RN/CM per Dr. Lavera Guise patient will need [facility] for IV Abx, will need [facility] [place] now for dressing changes, [facility] with [facility]. [facility], patient appt. for July 32th 2013 @ 0900, needs to be there at 08:45. contact number is [phone]. Notified Dr. Lavera Guise that patient will not be seen until July 31/2013. Dr. Lavera Guise to order [facility] dressing changes until pt. can be seen at [facility].   02/15/12  14:28  Anette Guarneri RN/CM spoke with patient regarding [facility] services for dressing changes Patient choice is [facility], son available to assist with dressing changes as needed.

## 2012-02-15 NOTE — Progress Notes (Signed)
ANTICOAGULATION AND ANTIBIOTIC CONSULT NOTE - FOLLOW UP  Pharmacy Consult for Vancomycin and Lovenox Indication: RLE cellulitis and VTE prophylaxis  No Known Allergies  Patient Measurements: Height: 5' 6.93" (170 cm) Weight: 432 lb (195.954 kg) IBW/kg (Calculated) : 65.94   Vital Signs: Temp: 99 F (37.2 C) (07/17 0709) Temp src: Oral (07/17 0709) BP: 147/81 mmHg (07/17 0709) Pulse Rate: 94  (07/17 0709) Intake/Output from previous day: 07/16 0701 - 07/17 0700 In: 920 [P.O.:920] Out: -  Intake/Output from this shift:    Labs:  Basename 02/14/12 0615 02/13/12 0920 02/12/12 1250  WBC 13.5* 12.2* --  HGB 12.9* 14.1 --  PLT 261 280 --  LABCREA -- -- --  CREATININE 0.97 0.84 0.89   Estimated Creatinine Clearance: 143.5 ml/min (by C-G formula based on Cr of 0.97).  Basename 02/12/12 0925  VANCOTROUGH 12.5  VANCOPEAK --  VANCORANDOM --  GENTTROUGH --  GENTPEAK --  GENTRANDOM --  TOBRATROUGH --  TOBRAPEAK --  TOBRARND --  AMIKACINPEAK --  AMIKACINTROU --  AMIKACIN --     Microbiology: Recent Results (from the past 720 hour(s))  URINE CULTURE     Status: Normal   Collection Time   02/07/12 10:42 AM      Component Value Range Status Comment   Colony Count NO GROWTH   Final    Organism ID, Bacteria NO GROWTH   Final   MRSA PCR SCREENING     Status: Normal   Collection Time   02/09/12  3:40 PM      Component Value Range Status Comment   MRSA by PCR NEGATIVE  NEGATIVE Final   URINE CULTURE     Status: Normal   Collection Time   02/09/12  6:51 PM      Component Value Range Status Comment   Specimen Description URINE, CLEAN CATCH   Final    Special Requests NONE   Final    Culture  Setup Time 02/09/2012 19:20   Final    Colony Count NO GROWTH   Final    Culture NO GROWTH   Final    Report Status 02/11/2012 FINAL   Final   CULTURE, BLOOD (SINGLE)     Status: Normal (Preliminary result)   Collection Time   02/12/12 12:50 PM      Component Value Range Status  Comment   Specimen Description BLOOD RIGHT ARM   Final    Special Requests BOTTLES DRAWN AEROBIC AND ANAEROBIC 10CC   Final    Culture  Setup Time 02/12/2012 19:22   Final    Culture     Final    Value:        BLOOD CULTURE RECEIVED NO GROWTH TO DATE CULTURE WILL BE HELD FOR 5 DAYS BEFORE ISSUING A FINAL NEGATIVE REPORT   Report Status PENDING   Incomplete   CLOSTRIDIUM DIFFICILE BY PCR     Status: Normal   Collection Time   02/12/12  5:25 PM      Component Value Range Status Comment   C difficile by pcr NEGATIVE  NEGATIVE Final     Anti-infectives     Start     Dose/Rate Route Frequency Ordered Stop   02/15/12 0400   vancomycin (VANCOCIN) 1,500 mg in sodium chloride 0.9 % 500 mL IVPB        1,500 mg 250 mL/hr over 120 Minutes Intravenous Every 12 hours 02/14/12 1525     02/10/12 1800   vancomycin (VANCOCIN) 2,000 mg in sodium  chloride 0.9 % 500 mL IVPB  Status:  Discontinued        2,000 mg 250 mL/hr over 120 Minutes Intravenous Every 24 hours 02/09/12 1648 02/10/12 0807   02/10/12 1000   vancomycin (VANCOCIN) 1,500 mg in sodium chloride 0.9 % 500 mL IVPB  Status:  Discontinued        1,500 mg 250 mL/hr over 120 Minutes Intravenous Every 12 hours 02/10/12 0807 02/14/12 1525   02/09/12 1800   vancomycin (VANCOCIN) 2,500 mg in sodium chloride 0.9 % 500 mL IVPB  Status:  Discontinued        2,500 mg 250 mL/hr over 120 Minutes Intravenous  Once 02/09/12 1648 02/11/12 1345   02/09/12 1800   Ampicillin-Sulbactam (UNASYN) 3 g in sodium chloride 0.9 % 100 mL IVPB  Status:  Discontinued        3 g 100 mL/hr over 60 Minutes Intravenous Every 6 hours 02/09/12 1648 02/12/12 1337          Assessment: 56 yo obese male on day 7 of vancomycin for RLE cellulitis. ARF has resolved and SCr is back to baseline. Patient is afebrile. Culture data is negative. WBC are trending down. Of note, patient received no vancomycin yesterday due to loss of IV site. Vancomycin was resumed this morning.  Physician note from yesterday also mentions changing to oral antibiotics today.  Patient is also on Lovenox for VTE prophylaxis. H/H have trended down since admit but no bleeding has been reported. Platelets are normal.  Goal of Therapy:  Vancomycin trough level 10-15 mcg/ml  Plan:  -Continue vancomycin 1500mg  IV q12h - hold off on another vancomycin trough as may change to oral antibiotics soon -Continue Lovenox 100 mg SQ q24h (0.5 mg/kg/day) -Monitor renal function -Monitor for s/sx bleeding  [facility], Pharm.D., BCPS Clinical Pharmacist Pager: [phone] 02/15/2012 8:20 AM

## 2012-02-15 NOTE — Progress Notes (Signed)
Peripherally Inserted Central Catheter/Midline Placement  The IV Nurse has discussed with the patient and/or persons authorized to consent for the patient, the purpose of this procedure and the potential benefits and risks involved with this procedure.  The benefits include less needle sticks, lab draws from the catheter and patient may be discharged home with the catheter.  Risks include, but not limited to, infection, bleeding, blood clot (thrombus formation), and puncture of an artery; nerve damage and irregular heat beat.  Alternatives to this procedure were also discussed.  PICC/Midline Placement Documentation        Dorena Bodo 02/15/2012, 7:29 PM

## 2012-02-15 NOTE — Progress Notes (Signed)
[facility] PROGRESS NOTE  Blake Garcia ZOX:[id number] DOB: Jun 14, 1956 DOA: 02/09/2012 PCP: Kerby Nora, MD  Interim history:   56 year old gentleman came in from doctors office as his cellulitis hasn't been improving despite being on oral antibiotics. He reports some cellulitis on the RLE about an inch long with some erythematous streaks on 7/4. He went to pcp got a prescrption for bactrim on 7/9, he reports using the antibiotic for 2 days, but his cellulitis worsened with multiple blisters and was draining clear fluid. He went to see his PCP, and he was referred to [name] for admission for IV antibiotics for worsening cellulitis. He was started on IV vancomycin and IV Unasyn till 7/14, but he continued to spike fevers on antibiotics. ID consult was obtained, with recommendations to stop Unasyn.   Principal Problem:  *Cellulitis of right leg Active Problems:  HYPERLIPIDEMIA  Morbid obesity  HYPERTENSION  SLEEP APNEA  Diastolic heart failure   Assessment/Plan:   1. Extensive cellulitis of the right leg from the ankle up to the knee: failed outpatient po therapy. There were blisters and clear fluid oozing from the affected area from the day of admission.X ray of the right LE shows soft tissue swelling and venous duplex did not show any svt or DVT. Surgery input appreciated, no indication of any procedure  patient on iv vanco  Day 7, unasyn was stopped by ID. Pt has been spiking fever until 7/14 on antibiotics, ID consult was obtained. Recommended c diff pcr which came back negative, Hiv antibody was negative. cxr negative for pneumonia, urine cultures negative.last VANCO trough was 13. Patient has no sloughing off of his necrotic skin - we shall continue intravenous antibiotics for now  2. Hypertension: better controlled. Resumed home medication.  3. Hyperlipidemia: resume home simvastatin.  4. Sleep apnea: continue CPAP at night.  5. Acute renal failure:On admission the creatinine was  elevated at 1.96. Patient was taken off ARB and HCTZ and started on iv fluids. ARF resolved by 7/14 with iv fluids. US kidney does not show hydronephrosis.  6. Leukocytosis: probably from the cellulitis. Improved slowly from 21.9 k on admit to 12.2 k on 7/15. Up to 13.5 on 7/16  7. Hypokalemia and hyponatremia: from HCTZ. Patient was started on gentle hydration and had K repleted as needed. Magnesium level was checked and it was normal.  8. Morbid obesity: BMI >69. 9. Chronic statis dermatitis: wound care and HHRN on discharge for daily dressing change. 10. Bilateral pedal edema and shortness of breath: Echo showed good left ventricular systolic function, but moderate diastolic dysfunction. Will start him on low dose lasix. And he will need further cardiac evaluation as outpatient.  11. Fever: no fever over the last 48 hours. , leukocytosis is improving, urine cultures negative and blood cultures ordered, cxr does not show pneumonia. Venous dopplers negative for DVT. HIV antibody negative, C diff PCR negative.  DVT prophylaxis Lovenox.    Consultants:  Surgery consult  Wound care consult.  Id consult  Procedures:  cxr   XR of the right leg  US kidney.  Antibiotics:  Vancomycin day 6.  HPI/Subjective: Concerned over the appearance of his right leg  Objective: Filed Vitals:   02/14/12 0800 02/14/12 1300 02/14/12 2152 02/15/12 0709  BP:  124/70 122/63 147/81  Pulse:  90 100 94  Temp: 98.8 F (37.1 C) 99.3 F (37.4 C) 99.2 F (37.3 C) 99 F (37.2 C)  TempSrc: Oral Axillary Oral Oral  Resp:  18  18 16  Height:      Weight:      SpO2:  96% 94% 95%    Intake/Output Summary (Last 24 hours) at 02/15/12 0854 Last data filed at 02/14/12 2156  Gross per 24 hour  Intake    920 ml  Output      0 ml  Net    920 ml    Exam:  Alert and oriented x 3  Heart regular rate and rhythm without murmurs rubs or gallops  Chest clear to auscultation Previous blistered areas have  evolved into loose yellow gelatinous nonviable tissue. Pink wound bed revealed underneath.   Data Reviewed: Basic Metabolic Panel:  Lab 02/14/12 9604 02/13/12 0920 02/12/12 1250 02/11/12 0525 02/10/12 0600 02/09/12 1527  NA 132* 134* 131* 131* 130* --  K 5.0 3.8 3.5 4.2 2.8* --  CL 95* 95* 92* 95* 91* --  CO2 28 26 30 24 24  --  GLUCOSE 117* 173* 141* 116* 119* --  BUN 9 9 10 22  31* --  CREATININE 0.97 0.84 0.89 1.25 1.96* --  CALCIUM 8.5 8.7 8.8 8.9 8.8 --  MG -- -- -- 2.6* -- 2.3  PHOS -- -- -- -- -- 2.7   Liver Function Tests:  Lab 02/09/12 1527  AST 17  ALT 15  ALKPHOS 100  BILITOT 0.7  PROT 7.2  ALBUMIN 2.5*   No results found for this basename: LIPASE:5,AMYLASE:5 in the last 168 hours No results found for this basename: AMMONIA:5 in the last 168 hours CBC:  Lab 02/14/12 0615 02/13/12 0920 02/12/12 0705 02/11/12 0525 02/10/12 0600 02/09/12 1527  WBC 13.5* 12.2* 17.5* 21.2* 21.9* --  NEUTROABS -- -- -- -- -- 22.3*  HGB 12.9* 14.1 14.3 13.6 13.8 --  HCT 38.3* 40.7 40.3 39.3 38.5* --  MCV 88.7 88.9 88.4 88.3 86.1 --  PLT 261 280 281 363 247 --   Cardiac Enzymes: No results found for this basename: CKTOTAL:5,CKMB:5,CKMBINDEX:5,TROPONINI:5 in the last 168 hours BNP (last 3 results) No results found for this basename: PROBNP:3 in the last 8760 hours CBG: No results found for this basename: GLUCAP:5 in the last 168 hours  Recent Results (from the past 240 hour(s))  URINE CULTURE     Status: Normal   Collection Time   02/07/12 10:42 AM      Component Value Range Status Comment   Colony Count NO GROWTH   Final    Organism ID, Bacteria NO GROWTH   Final   MRSA PCR SCREENING     Status: Normal   Collection Time   02/09/12  3:40 PM      Component Value Range Status Comment   MRSA by PCR NEGATIVE  NEGATIVE Final   URINE CULTURE     Status: Normal   Collection Time   02/09/12  6:51 PM      Component Value Range Status Comment   Specimen Description URINE, CLEAN CATCH    Final    Special Requests NONE   Final    Culture  Setup Time 02/09/2012 19:20   Final    Colony Count NO GROWTH   Final    Culture NO GROWTH   Final    Report Status 02/11/2012 FINAL   Final   CULTURE, BLOOD (SINGLE)     Status: Normal (Preliminary result)   Collection Time   02/12/12 12:50 PM      Component Value Range Status Comment   Specimen Description BLOOD RIGHT ARM   Final  Special Requests BOTTLES DRAWN AEROBIC AND ANAEROBIC 10CC   Final    Culture  Setup Time 02/12/2012 19:22   Final    Culture     Final    Value:        BLOOD CULTURE RECEIVED NO GROWTH TO DATE CULTURE WILL BE HELD FOR 5 DAYS BEFORE ISSUING A FINAL NEGATIVE REPORT   Report Status PENDING   Incomplete   CLOSTRIDIUM DIFFICILE BY PCR     Status: Normal   Collection Time   02/12/12  5:25 PM      Component Value Range Status Comment   C difficile by pcr NEGATIVE  NEGATIVE Final      Studies: Dg Tibia/fibula Right  02/09/2012  *RADIOLOGY REPORT*  Clinical Data: 56 year old male cellulitis swelling and pain.  RIGHT TIBIA AND FIBULA - 2 VIEW  Comparison: None.  Findings: Diffuse.  Diffuse soft tissue swelling stranding in the visualized right lower extremity. Bone mineralization is within normal limits.  Right tibia and fibula appear intact.  Degenerative changes at the right knee.  Less pronounced degenerative changes at the right ankle.  No subcutaneous gas. No radiopaque foreign body identified.  IMPRESSION: No acute osseous abnormality identified about the right tib-fib. Soft tissue swelling and stranding.  Original Report Authenticated By: Ulla Potash III, M.D.    Scheduled Meds:    . amLODipine  10 mg Oral Daily  . aspirin EC  81 mg Oral Daily  . enoxaparin (LOVENOX) injection  100 mg Subcutaneous QHS  . furosemide  40 mg Intravenous Once  . oxyCODONE  5 mg Oral Once  . simvastatin  40 mg Oral QHS  . vancomycin  1,500 mg Intravenous Q12H  . venlafaxine XR  75 mg Oral Q breakfast  . DISCONTD: furosemide   20 mg Oral Daily  . DISCONTD: potassium chloride  20 mEq Oral Daily  . DISCONTD: vancomycin  1,500 mg Intravenous Q12H   Continuous Infusions:     Jarone Ostergaard [facility] Pager [phone]  If 7PM-7AM, please contact night-coverage [email or web address] Password [email or web address] 02/15/2012, 8:54 AM   LOS: 6 days

## 2012-02-15 NOTE — Consult Note (Addendum)
Wound care follow-up:  Consult requested to assess right leg with Dr Lavera Guise.  Refer to previous WOC progress note for assessment and measurements.  Previous blistered areas have evolved into loose yellow gelatinous nonviable tissue.  This removes easily when lightly scrubbed with guaze or lifting off with forceps.  Pink wound bed revealed underneath.  Large amt yellow fluid draining from sites. Pt denies discomfort with removal of outer tissue.  Expect newer patchy areas to evolve as blisters affect different areas.  Continue present plan of care with non-adherent contact layer and silver hydrofiber to absorb drainage, and acewrap to provide light compression and assist with decreasing edema. Dr Lavera Guise plans to leave in hospital until legs improve further.  Cammie Mcgee, RN, MSN, [employer]  [phone]

## 2012-02-16 LAB — CBC
HCT: 39.2 % (ref 39.0–52.0)
MCH: 30 pg (ref 26.0–34.0)
MCV: 89.1 fL (ref 78.0–100.0)
Platelets: 241 10*3/uL (ref 150–400)
RDW: 14.4 % (ref 11.5–15.5)

## 2012-02-16 LAB — BASIC METABOLIC PANEL
BUN: 10 mg/dL (ref 6–23)
Creatinine, Ser: 0.92 mg/dL (ref 0.50–1.35)
GFR calc Af Amer: >90 mL/min (ref >90)
GFR calc non Af Amer: >90 mL/min (ref >90)
Glucose, Bld: 113 mg/dL — ABNORMAL HIGH (ref 70–99)

## 2012-02-16 MED ORDER — FUROSEMIDE 10 MG/ML IJ SOLN
40.0000 mg | Freq: Once | INTRAMUSCULAR | Status: AC
  Administered 2012-02-16: 40 mg via INTRAVENOUS

## 2012-02-16 MED ORDER — FUROSEMIDE 10 MG/ML IJ SOLN
INTRAMUSCULAR | Status: AC
  Filled 2012-02-16: qty 4

## 2012-02-16 NOTE — Progress Notes (Signed)
[facility] PROGRESS NOTE  Blake Garcia JYN:[id number] DOB: January 17, 1956 DOA: 02/09/2012 PCP: Kerby Nora, MD  Interim history:   56 year old gentleman came in from doctors office as his cellulitis hasn't been improving despite being on oral antibiotics. He reports some cellulitis on the RLE about an inch long with some erythematous streaks on 7/4. He went to pcp got a prescrption for bactrim on 7/9, he reports using the antibiotic for 2 days, but his cellulitis worsened with multiple blisters and was draining clear fluid. He went to see his PCP, and he was referred to [name] for admission for IV antibiotics for worsening cellulitis. He was started on IV vancomycin and IV Unasyn till 7/14, but he continued to spike fevers on antibiotics. ID consult was obtained, with recommendations to stop Unasyn.   Principal Problem:  *Cellulitis of right leg Active Problems:  HYPERLIPIDEMIA  Morbid obesity  HYPERTENSION  SLEEP APNEA  Diastolic heart failure   Assessment/Plan:   1. Extensive cellulitis of the right leg from the ankle up to the knee: failed outpatient po therapy. There were blisters and clear fluid oozing from the affected area from the day of admission.X ray of the right LE shows soft tissue swelling and venous duplex did not show any svt or DVT. Surgery input appreciated, no indication of any procedure  patient on iv vanco  Day8, unasyn was stopped by ID. Pt has been spiking fever until 7/14 on antibiotics, ID consult was obtained. Recommended c diff pcr which came back negative, Hiv antibody was negative. cxr negative for pneumonia, urine cultures negative. Last VANCO trough was 13. Patient has now sloughing off of his necrotic skin - we shall continue intravenous antibiotics for now and local wound debridement  2. Hypertension: better controlled. Resumed home medication.  3. Hyperlipidemia: resume home simvastatin.  4. Sleep apnea: continue CPAP at night.  5. Acute renal failure:On  admission the creatinine was elevated at 1.96. Patient was taken off ARB and HCTZ and started on iv fluids. ARF resolved by 7/14 with iv fluids. US kidney does not show hydronephrosis.  6. Leukocytosis: probably from the cellulitis. Improved slowly from 21.9 k on admit to 12.2 k on 7/15. Up to 13.5 on 7/16  Back down to 13.2 on July 18 7. Hypokalemia and hyponatremia: from HCTZ. Patient was started on gentle hydration and had K repleted as needed. Magnesium level was checked and it was normal.  8. Morbid obesity: BMI >69. 9. Chronic statis dermatitis: wound care and HHRN on discharge for daily dressing change. 10. Bilateral pedal edema and shortness of breath: Echo showed good left ventricular systolic function, but moderate diastolic dysfunction. Patient getting intermittent iv lasix for diuresis    Consultants:  Surgery consult  Wound care consult.  Id consult  Procedures:  cxr   XR of the right leg  US kidney.  Antibiotics:  Vancomycin 7/11-  HPI/Subjective:   finally feels better Objective: Filed Vitals:   02/14/12 2152 02/15/12 0709 02/15/12 1449 02/16/12 0559  BP: 122/63 147/81 134/75 132/76  Pulse: 100 94 102 97  Temp: 99.2 F (37.3 C) 99 F (37.2 C) 98.9 F (37.2 C) 98 F (36.7 C)  TempSrc: Oral Oral    Resp: 18 16 20 18   Height:      Weight:      SpO2: 94% 95% 97% 95%    Intake/Output Summary (Last 24 hours) at 02/16/12 1019 Last data filed at 02/16/12 0700  Gross per 24 hour  Intake  240 ml  Output      0 ml  Net    240 ml    Exam:  Alert and oriented x 3  Heart regular rate and rhythm without murmurs rubs or gallops  Chest clear to auscultation Previous blistered areas have evolved into loose yellow gelatinous nonviable tissue. Pink wound bed revealed underneath.   Data Reviewed: Basic Metabolic Panel:  Lab 02/16/12 4098 02/14/12 0615 02/13/12 0920 02/12/12 1250 02/11/12 0525 02/09/12 1527  NA 135 132* 134* 131* 131* --  K 4.0 5.0 3.8  3.5 4.2 --  CL 97 95* 95* 92* 95* --  CO2 29 28 26 30 24  --  GLUCOSE 113* 117* 173* 141* 116* --  BUN 10 9 9 10 22  --  CREATININE 0.92 0.97 0.84 0.89 1.25 --  CALCIUM 8.5 8.5 8.7 8.8 8.9 --  MG -- -- -- -- 2.6* 2.3  PHOS -- -- -- -- -- 2.7   Liver Function Tests:  Lab 02/09/12 1527  AST 17  ALT 15  ALKPHOS 100  BILITOT 0.7  PROT 7.2  ALBUMIN 2.5*   No results found for this basename: LIPASE:5,AMYLASE:5 in the last 168 hours No results found for this basename: AMMONIA:5 in the last 168 hours CBC:  Lab 02/14/12 0615 02/13/12 0920 02/12/12 0705 02/11/12 0525 02/10/12 0600 02/09/12 1527  WBC 13.5* 12.2* 17.5* 21.2* 21.9* --  NEUTROABS -- -- -- -- -- 22.3*  HGB 12.9* 14.1 14.3 13.6 13.8 --  HCT 38.3* 40.7 40.3 39.3 38.5* --  MCV 88.7 88.9 88.4 88.3 86.1 --  PLT 261 280 281 363 247 --   Cardiac Enzymes: No results found for this basename: CKTOTAL:5,CKMB:5,CKMBINDEX:5,TROPONINI:5 in the last 168 hours BNP (last 3 results) No results found for this basename: PROBNP:3 in the last 8760 hours CBG: No results found for this basename: GLUCAP:5 in the last 168 hours  Recent Results (from the past 240 hour(s))  URINE CULTURE     Status: Normal   Collection Time   02/07/12 10:42 AM      Component Value Range Status Comment   Colony Count NO GROWTH   Final    Organism ID, Bacteria NO GROWTH   Final   MRSA PCR SCREENING     Status: Normal   Collection Time   02/09/12  3:40 PM      Component Value Range Status Comment   MRSA by PCR NEGATIVE  NEGATIVE Final   URINE CULTURE     Status: Normal   Collection Time   02/09/12  6:51 PM      Component Value Range Status Comment   Specimen Description URINE, CLEAN CATCH   Final    Special Requests NONE   Final    Culture  Setup Time 02/09/2012 19:20   Final    Colony Count NO GROWTH   Final    Culture NO GROWTH   Final    Report Status 02/11/2012 FINAL   Final   CULTURE, BLOOD (SINGLE)     Status: Normal (Preliminary result)   Collection  Time   02/12/12 12:50 PM      Component Value Range Status Comment   Specimen Description BLOOD RIGHT ARM   Final    Special Requests BOTTLES DRAWN AEROBIC AND ANAEROBIC 10CC   Final    Culture  Setup Time 02/12/2012 19:22   Final    Culture     Final    Value:        BLOOD CULTURE  RECEIVED NO GROWTH TO DATE CULTURE WILL BE HELD FOR 5 DAYS BEFORE ISSUING A FINAL NEGATIVE REPORT   Report Status PENDING   Incomplete   CLOSTRIDIUM DIFFICILE BY PCR     Status: Normal   Collection Time   02/12/12  5:25 PM      Component Value Range Status Comment   C difficile by pcr NEGATIVE  NEGATIVE Final      Studies: Dg Tibia/fibula Right  02/09/2012  *RADIOLOGY REPORT*  Clinical Data: 56 year old male cellulitis swelling and pain.  RIGHT TIBIA AND FIBULA - 2 VIEW  Comparison: None.  Findings: Diffuse.  Diffuse soft tissue swelling stranding in the visualized right lower extremity. Bone mineralization is within normal limits.  Right tibia and fibula appear intact.  Degenerative changes at the right knee.  Less pronounced degenerative changes at the right ankle.  No subcutaneous gas. No radiopaque foreign body identified.  IMPRESSION: No acute osseous abnormality identified about the right tib-fib. Soft tissue swelling and stranding.  Original Report Authenticated By: Ulla Potash III, M.D.    Scheduled Meds:    . amLODipine  10 mg Oral Daily  . aspirin EC  81 mg Oral Daily  . enoxaparin (LOVENOX) injection  100 mg Subcutaneous QHS  . furosemide      . furosemide  40 mg Intravenous Once  . simvastatin  40 mg Oral QHS  . vancomycin  1,500 mg Intravenous Q12H  . vancomycin  750 mg Intravenous Once  . venlafaxine XR  75 mg Oral Q breakfast   Continuous Infusions:     Paije Goodhart [facility] Pager [phone]  If 7PM-7AM, please contact night-coverage [email or web address] Password [email or web address] 02/16/2012, 10:19 AM   LOS: 7 days

## 2012-02-16 NOTE — Consult Note (Signed)
WOC follow up  Pt seen today for further CSWD (conservative sharp wound debridement), erythema much improved and edema improving.  He still has several large serous filled blisters that are intact on the posterior calf and the lateral calf.  I have tried to debride off the gelatinous material and drained several of the larger areas today.  WOC will continue to follow for daily debridements.  I have explained that these areas will probably drain more now, and the pt is aware of this. I have place silver hydrofiber dressings to all the actively oozing area I can see today and covered the other areas with non adherent dressing. I wrapped him in ABD pads and 3 kerlix today to try to prevent leakage of fluid onto the floor.  He was up last night with lots of leakage from his dressings.  I placed 2 ACE wraps for light compression as well.    WOC team will follow along with you for management RLE wounds.  Joran Kallal [place], [place] [phone]

## 2012-02-17 LAB — CBC
HCT: 37.1 % — ABNORMAL LOW (ref 39.0–52.0)
MCH: 29.9 pg (ref 26.0–34.0)
MCHC: 33.4 g/dL (ref 30.0–36.0)
RDW: 14.3 % (ref 11.5–15.5)

## 2012-02-17 MED ORDER — FUROSEMIDE 40 MG PO TABS
40.0000 mg | ORAL_TABLET | Freq: Every day | ORAL | Status: DC
  Administered 2012-02-17 – 2012-02-21 (×5): 40 mg via ORAL
  Filled 2012-02-17 (×5): qty 1

## 2012-02-17 MED ORDER — IRBESARTAN 300 MG PO TABS
300.0000 mg | ORAL_TABLET | Freq: Every day | ORAL | Status: DC
  Administered 2012-02-17 – 2012-02-21 (×5): 300 mg via ORAL
  Filled 2012-02-17 (×5): qty 1

## 2012-02-17 NOTE — Consult Note (Signed)
WOC follow up Pt seen today for evaluation of status of RLE wounds.  He continues to have copious serous drainage from this area.  The original blistered area on the pretibial area is now clean and almost completely reepithealized with scant drainage.  However the lateral and posterior calf are still covered with serous filled blistered areas and some are darkened.  I have spoken with Dr. Lavera Guise today and initiated PT for hydrotherapy for more aggressive debridement of the posterior calf and lateral calf.  They have met me at the bedside and will begin tx today and continue M-Sat.  I will continue to order silver hydrofiber, non adherent, ABD pads and kerlix with slight compression with ACE for RLE to be applied after hydrotherapy per PT and then per bedside nursing on Q Sundays.  Dressing are at the bedside for use.  He is tolerating hydrotherapy well while the WOC is still here on the floor.    WOC team will follow up Monday to see about his wound progression.   Blake Garcia [place], [place]  [phone]

## 2012-02-17 NOTE — Progress Notes (Signed)
[facility] PROGRESS NOTE  Blake Garcia NFA:[id number] DOB: 14-Jul-1956 DOA: 02/09/2012 PCP: Kerby Nora, MD    Principal Problem:  *Cellulitis of right leg Active Problems:  HYPERLIPIDEMIA  Morbid obesity  HYPERTENSION  SLEEP APNEA  Diastolic heart failure   Assessment/Plan:   1. Extensive cellulitis of the right leg from the ankle up to the knee: failed outpatient po therapy. There were blisters and clear fluid oozing from the affected area from the day of admission.X ray of the right LE shows soft tissue swelling and venous duplex did not show any svt or DVT. Surgery input appreciated, no indication of any procedure  patient on iv vanco  Day8, unasyn was stopped by ID. Pt has been spiking fever until 7/14 on antibiotics, ID consult was obtained. Recommended c diff pcr which came back negative, Hiv antibody was negative. cxr negative for pneumonia, urine cultures negative. Last VANCO trough was 13. Patient has now sloughing off of his necrotic skin - we shall continue intravenous antibiotics for now and local wound debridement. Patient was started on hydrotherapy on February 17, 2012 for more aggressive wound debridement.  2. Hypertension: better controlled. Resumed home medication on 7/19.  3. Hyperlipidemia: resumed home simvastatin.  4. Sleep apnea: continue CPAP at night.  5. Acute renal failure:On admission the creatinine was elevated at 1.96. Patient was taken off ARB and HCTZ and started on iv fluids. ARF resolved by 7/14 with iv fluids. US kidney does not show hydronephrosis.  6. Leukocytosis: probably from the cellulitis. Improved slowly from 21.9 k on admit to 12.2 k on 7/15. Up to 13.5 on 7/16  Back down to 13.2 on July 18. It finally resulted by July 19 7. Hypokalemia and hyponatremia: from HCTZ. Patient was started on gentle hydration and had K repleted as needed. Magnesium level was checked and it was normal.  8. Morbid obesity: BMI >69. 9. Chronic statis dermatitis:  wound care and HHRN on discharge for daily dressing change. 10. Bilateral pedal edema and shortness of breath: Echo showed good left ventricular systolic function, but moderate diastolic dysfunction. Patient received intermittent iv lasix for diuresis from July 16 until July 18. Started on oral furosemide on July 19   Consultants:  Surgery consult  Wound care consult.  Id consult  Procedures:  cxr   XR of the right leg  US kidney.  Antibiotics:  Vancomycin 7/11-  HPI/Subjective:   no new complaints Objective: Filed Vitals:   02/16/12 1405 02/16/12 2125 02/16/12 2320 02/17/12 0501  BP: 129/64 160/84  149/95  Pulse: 102 99 97 98  Temp: 99.2 F (37.3 C) 98.5 F (36.9 C)  97.1 F (36.2 C)  TempSrc:  Oral  Oral  Resp: 20 21 20 21   Height:      Weight:      SpO2: 94% 96% 96% 97%    Intake/Output Summary (Last 24 hours) at 02/17/12 1034 Last data filed at 02/17/12 0700  Gross per 24 hour  Intake   1960 ml  Output      0 ml  Net   1960 ml    Exam: Alert and oriented x3 Chest clear without wheezes Heart regular without murmurs Right leg with Ace wrap erythema only below the knee  Data Reviewed: Basic Metabolic Panel:  Lab 02/16/12 4696 02/14/12 0615 02/13/12 0920 02/12/12 1250 02/11/12 0525  NA 135 132* 134* 131* 131*  K 4.0 5.0 3.8 3.5 4.2  CL 97 95* 95* 92* 95*  CO2 29 28 26  30  24  GLUCOSE 113* 117* 173* 141* 116*  BUN 10 9 9 10 22   CREATININE 0.92 0.97 0.84 0.89 1.25  CALCIUM 8.5 8.5 8.7 8.8 8.9  MG -- -- -- -- 2.6*  PHOS -- -- -- -- --   Liver Function Tests: No results found for this basename: AST:5,ALT:5,ALKPHOS:5,BILITOT:5,PROT:5,ALBUMIN:5 in the last 168 hours No results found for this basename: LIPASE:5,AMYLASE:5 in the last 168 hours No results found for this basename: AMMONIA:5 in the last 168 hours CBC:  Lab 02/17/12 0900 02/16/12 1050 02/14/12 0615 02/13/12 0920 02/12/12 0705  WBC 9.6 13.2* 13.5* 12.2* 17.5*  NEUTROABS -- -- -- -- --   HGB 12.4* 13.2 12.9* 14.1 14.3  HCT 37.1* 39.2 38.3* 40.7 40.3  MCV 89.4 89.1 88.7 88.9 88.4  PLT 224 241 261 280 281   Cardiac Enzymes: No results found for this basename: CKTOTAL:5,CKMB:5,CKMBINDEX:5,TROPONINI:5 in the last 168 hours BNP (last 3 results) No results found for this basename: PROBNP:3 in the last 8760 hours CBG: No results found for this basename: GLUCAP:5 in the last 168 hours  Recent Results (from the past 240 hour(s))  URINE CULTURE     Status: Normal   Collection Time   02/07/12 10:42 AM      Component Value Range Status Comment   Colony Count NO GROWTH   Final    Organism ID, Bacteria NO GROWTH   Final   MRSA PCR SCREENING     Status: Normal   Collection Time   02/09/12  3:40 PM      Component Value Range Status Comment   MRSA by PCR NEGATIVE  NEGATIVE Final   URINE CULTURE     Status: Normal   Collection Time   02/09/12  6:51 PM      Component Value Range Status Comment   Specimen Description URINE, CLEAN CATCH   Final    Special Requests NONE   Final    Culture  Setup Time 02/09/2012 19:20   Final    Colony Count NO GROWTH   Final    Culture NO GROWTH   Final    Report Status 02/11/2012 FINAL   Final   CULTURE, BLOOD (SINGLE)     Status: Normal (Preliminary result)   Collection Time   02/12/12 12:50 PM      Component Value Range Status Comment   Specimen Description BLOOD RIGHT ARM   Final    Special Requests BOTTLES DRAWN AEROBIC AND ANAEROBIC 10CC   Final    Culture  Setup Time 02/12/2012 19:22   Final    Culture     Final    Value:        BLOOD CULTURE RECEIVED NO GROWTH TO DATE CULTURE WILL BE HELD FOR 5 DAYS BEFORE ISSUING A FINAL NEGATIVE REPORT   Report Status PENDING   Incomplete   CLOSTRIDIUM DIFFICILE BY PCR     Status: Normal   Collection Time   02/12/12  5:25 PM      Component Value Range Status Comment   C difficile by pcr NEGATIVE  NEGATIVE Final      Studies: Dg Tibia/fibula Right  02/09/2012  *RADIOLOGY REPORT*  Clinical Data:  56 year old male cellulitis swelling and pain.  RIGHT TIBIA AND FIBULA - 2 VIEW  Comparison: None.  Findings: Diffuse.  Diffuse soft tissue swelling stranding in the visualized right lower extremity. Bone mineralization is within normal limits.  Right tibia and fibula appear intact.  Degenerative changes at the right knee.  Less pronounced degenerative changes at the right ankle.  No subcutaneous gas. No radiopaque foreign body identified.  IMPRESSION: No acute osseous abnormality identified about the right tib-fib. Soft tissue swelling and stranding.  Original Report Authenticated By: Ulla Potash III, M.D.    Scheduled Meds:    . amLODipine  10 mg Oral Daily  . aspirin EC  81 mg Oral Daily  . enoxaparin (LOVENOX) injection  100 mg Subcutaneous QHS  . furosemide      . furosemide  40 mg Intravenous Once  . furosemide  40 mg Oral Daily  . simvastatin  40 mg Oral QHS  . vancomycin  1,500 mg Intravenous Q12H  . venlafaxine XR  75 mg Oral Q breakfast   Continuous Infusions:     Simone Rodenbeck [facility] Pager [phone]  If 7PM-7AM, please contact night-coverage [email or web address] Password [email or web address] 02/17/2012, 10:34 AM   LOS: 8 days

## 2012-02-17 NOTE — Progress Notes (Signed)
Hydrotherapy Evaluation:  Past Medical History  Diagnosis Date  . Depression   . Hyperlipidemia   . Hypertension   . Kidney stones   . Complication of anesthesia ~ 2000    "during OR for kidney stones; anesthesia RX made me code twice in one week"  . Cellulitis 02/09/2012    RLE  . OSA (obstructive sleep apnea)     "wear CPAP"  . History of blood transfusion 1958    "I was an Rh baby"    Past Surgical History  Procedure Date  . Lithotripsy 2000  . Esophagogastroduodenoscopy   . Knee arthroscopy ~ 2004    right  . Cholecystectomy 1993     02/17/12 1247  Subjective Assessment  Subjective "Thank you for all of your help."  Patient and Family Stated Goals Heal wound.  Date of Onset 02/10/12  Prior Treatments Outpatient wound center, WOC, debridement, dressing changes, antibiotics.  Evaluation and Treatment  Evaluation and Treatment Procedures Explained to Patient/Family Yes  Evaluation and Treatment Procedures agreed to  Wound 02/09/12  Leg Right;Circumferential Red, swollen, warm to touch and weeping Cellulitis  Date First Assessed/Time First Assessed: 02/09/12 1500   Wound Type: (c)   Location: Leg  Location Orientation: Right;Circumferential  Wound Description (Comments): Red, swollen, warm to touch and weeping Cellulitis  Present on Admission: Yes  Site / Wound Assessment Red;Yellow  % Wound base Red or Granulating 50%  % Wound base Yellow 50%  % Wound base Black 0%  % Wound base Other (Comment) 0%  Wound Length (cm) 34 cm  Wound Width (cm) 56 cm  Wound Depth (cm) 0 cm  Tunneling (cm) 0  Undermining (cm) 0  Margins Attached edges (approximated)  Closure None  Drainage Amount Moderate  Drainage Description Serous  Non-staged Wound Description Not applicable  Treatment Debridement (Selective);Hydrotherapy (Pulse lavage)  Dressing Type Gauze (Comment);ABD;Compression wrap (Aquacel, Mepitel, dry 4x4s, ABDs, Ace wrap)  Dressing Changed New  Dressing Status Clean;Dry   Hydrotherapy  Pulsed Lavage with Suction (psi) 8 psi  Pulsed Lavage with Suction - Normal Saline Used 1000 mL  Pulsed lavage therapy - wound location Right LE (8 psi x 1000 mL NS)  Selective Debridement  Selective Debridement - Location Right LE  Selective Debridement - Tools Used Forceps;Scissors  Selective Debridement - Tissue Removed Slough and non-viable skin.  Wound Therapy - Assess/Plan/Recommendations  Wound Therapy - Clinical Statement Pt is a 56 y/o male with right LE cellulitis resulting in circumferential wound with increased drainage and slough as well as non-viable skin.  Pt would benefit from hydrotherapy to assist with debridement while facilitating healing.  Wound Therapy - Functional Problem List Patient with decreased skin integrity causing increased chance of infection.  Factors Delaying/Impairing Wound Healing Infection - systemic/local;Multiple medical problems  Hydrotherapy Plan Debridement;Dressing change;Patient/family education;Pulsatile lavage with suction  Wound Therapy - Frequency 6X / week  Wound Therapy - Current Recommendations WOC nurse  Wound Therapy - Follow Up Recommendations Home health RN  Wound Plan Pulse lavage, Selective debridement, dressing change.  Wound Therapy Goals - Improve the function of patient's integumentary system by progressing the wound(s) through the phases of wound healing by:  Decrease Necrotic Tissue to 30  Decrease Necrotic Tissue - Progress Goal set today  Increase Granulation Tissue to 70  Increase Granulation Tissue - Progress Goal set today  Improve Drainage Characteristics Min  Improve Drainage Characteristics - Progress Goal set today  Goals/treatment plan/discharge plan were made with and agreed upon by patient/family  Yes  Time For Goal Achievement 7 days  Wound Therapy - Potential for Goals Good    Pain:  "I'm doing ok with it."  02/17/2012 [name], PT, DPT [phone]

## 2012-02-18 LAB — CULTURE, BLOOD (SINGLE): Culture: NO GROWTH

## 2012-02-18 NOTE — Progress Notes (Signed)
ANTICOAGULATION AND ANTIBIOTIC CONSULT NOTE - FOLLOW UP  Pharmacy Consult:  Vancomycin and Lovenox Indication: RLE cellulitis and VTE prophylaxis  No Known Allergies  Patient Measurements: Height: 5' 6.93" (170 cm) Weight: 432 lb (195.954 kg) IBW/kg (Calculated) : 65.94   Vital Signs: Temp: 98.5 F (36.9 C) (07/20 0551) BP: 116/90 mmHg (07/20 0551) Pulse Rate: 95  (07/20 0551)  Labs:  Basename 02/17/12 0900 02/16/12 1050 02/16/12 0500  WBC 9.6 13.2* --  HGB 12.4* 13.2 --  PLT 224 241 --  LABCREA -- -- --  CREATININE -- -- 0.92   Estimated Creatinine Clearance: 151.3 ml/min (by C-G formula based on Cr of 0.92). No results found for this basename: VANCOTROUGH:2,VANCOPEAK:2,VANCORANDOM:2,GENTTROUGH:2,GENTPEAK:2,GENTRANDOM:2,TOBRATROUGH:2,TOBRAPEAK:2,TOBRARND:2,AMIKACINPEAK:2,AMIKACINTROU:2,AMIKACIN:2, in the last 72 hours   Microbiology: Recent Results (from the past 720 hour(s))  URINE CULTURE     Status: Normal   Collection Time   02/07/12 10:42 AM      Component Value Range Status Comment   Colony Count NO GROWTH   Final    Organism ID, Bacteria NO GROWTH   Final   MRSA PCR SCREENING     Status: Normal   Collection Time   02/09/12  3:40 PM      Component Value Range Status Comment   MRSA by PCR NEGATIVE  NEGATIVE Final   URINE CULTURE     Status: Normal   Collection Time   02/09/12  6:51 PM      Component Value Range Status Comment   Specimen Description URINE, CLEAN CATCH   Final    Special Requests NONE   Final    Culture  Setup Time 02/09/2012 19:20   Final    Colony Count NO GROWTH   Final    Culture NO GROWTH   Final    Report Status 02/11/2012 FINAL   Final   CULTURE, BLOOD (SINGLE)     Status: Normal (Preliminary result)   Collection Time   02/12/12 12:50 PM      Component Value Range Status Comment   Specimen Description BLOOD RIGHT ARM   Final    Special Requests BOTTLES DRAWN AEROBIC AND ANAEROBIC 10CC   Final    Culture  Setup Time 02/12/2012 19:22    Final    Culture     Final    Value:        BLOOD CULTURE RECEIVED NO GROWTH TO DATE CULTURE WILL BE HELD FOR 5 DAYS BEFORE ISSUING A FINAL NEGATIVE REPORT   Report Status PENDING   Incomplete   CLOSTRIDIUM DIFFICILE BY PCR     Status: Normal   Collection Time   02/12/12  5:25 PM      Component Value Range Status Comment   C difficile by pcr NEGATIVE  NEGATIVE Final        Assessment: 56 yo obese male on vancomycin for RLE cellulitis. ARF has resolved and SCr is back to baseline. Patient is afebrile, culture data is negative, and WBC are trending down.  Last vanc trough was therapeutic on 02/12/12.  Vanc 7/14 >> Vanc 7/11>> Unasyn 7/11>>7/15  7/11 urine - negative  7/14 Blood-neg 7/14 Cdiff- neg HIV Ab neg   Patient is also on Lovenox for VTE prophylaxis. H/H have trended down since admit but no bleeding has been reported. Platelets are normal.  Renal function is appropriate for current Lovenox dosing.   Goal of Therapy:  Vancomycin trough level 10-15 mcg/ml   Plan:  - Continue vanc 1500mg  IV q12h - Continue Lovenox 100  mg SQ q24h (0.5 mg/kg/day) for BMI > 30 - Monitor renal function, s/sx of bleeding, weekly vanc trough      Tu Shimmel D. Laney Potash, PharmD, BCPS Pager:  [phone] 02/18/2012, 1:49 PM

## 2012-02-18 NOTE — Progress Notes (Signed)
[facility] PROGRESS NOTE  Blake Garcia ZOX:[id number] DOB: 02-06-1956 DOA: 02/09/2012 PCP: Kerby Nora, MD    Principal Problem:  *Cellulitis of right leg Active Problems:  HYPERLIPIDEMIA  Morbid obesity  HYPERTENSION  SLEEP APNEA  Diastolic heart failure   Assessment/Plan:   1. Extensive cellulitis of the right leg from the ankle up to the knee: failed outpatient po therapy. There were blisters and clear fluid oozing from the affected area from the day of admission.X ray of the right LE shows soft tissue swelling and venous duplex did not show any svt or DVT. Surgery input appreciated, no indication of any procedure  patient on iv vanco  Day8, unasyn was stopped by ID. Pt has been spiking fever until 7/14 on antibiotics, ID consult was obtained. Recommended c diff pcr which came back negative, Hiv antibody was negative. cxr negative for pneumonia, urine cultures negative. Last VANCO trough was 13. Patient has now sloughing off of his necrotic skin - we shall continue intravenous antibiotics for now and local wound debridement. Patient was started on hydrotherapy on February 17, 2012 for more aggressive wound debridement.  2. Hypertension: better controlled. Resumed home medication on 7/19.  3. Hyperlipidemia: resumed home simvastatin.  4. Sleep apnea: continue CPAP at night.  5. Acute renal failure:On admission the creatinine was elevated at 1.96. Patient was taken off ARB and HCTZ and started on iv fluids. ARF resolved by 7/14 with iv fluids. US kidney does not show hydronephrosis.  6. Leukocytosis: probably from the cellulitis. Improved slowly from 21.9 k on admit to 12.2 k on 7/15. Up to 13.5 on 7/16  Back down to 13.2 on July 18. It finally resulted by July 19 7. Hypokalemia and hyponatremia: from HCTZ. Patient was started on gentle hydration and had K repleted as needed. Magnesium level was checked and it was normal.  8. Morbid obesity: BMI >69. 9. Chronic statis dermatitis:  wound care and HHRN on discharge for daily dressing change. 10. Bilateral pedal edema and shortness of breath: Echo showed good left ventricular systolic function, but moderate diastolic dysfunction. Patient received intermittent iv lasix for diuresis from July 16 until July 18. Started on oral furosemide on July 19   Consultants:  Surgery consult  Wound care consult.  Id consult  Procedures:  cxr   XR of the right leg  US kidney.  Antibiotics:  Vancomycin 7/11-  HPI/Subjective: Continues to feel better  Objective: Filed Vitals:   02/17/12 1131 02/17/12 1300 02/17/12 2144 02/18/12 0551  BP: 124/61 154/76 140/98 116/90  Pulse:  106 98 95  Temp:  98.2 F (36.8 C) 99.9 F (37.7 C) 98.5 F (36.9 C)  TempSrc:  Oral    Resp:  20 20 20   Height:      Weight:      SpO2:  93% 92% 97%   No intake or output data in the 24 hours ending 02/18/12 0827  Exam:  Alert and oriented x3 Chest clear to auscultation Heart regular without murmurs rubs or gallops Right lower extremity with erythema decrease sloughing decreased amount of oozing Data Reviewed: Basic Metabolic Panel:  Lab 02/16/12 8119 02/14/12 0615 02/13/12 0920 02/12/12 1250  NA 135 132* 134* 131*  K 4.0 5.0 3.8 3.5  CL 97 95* 95* 92*  CO2 29 28 26 30   GLUCOSE 113* 117* 173* 141*  BUN 10 9 9 10   CREATININE 0.92 0.97 0.84 0.89  CALCIUM 8.5 8.5 8.7 8.8  MG -- -- -- --  PHOS -- -- -- --  Liver Function Tests: No results found for this basename: AST:5,ALT:5,ALKPHOS:5,BILITOT:5,PROT:5,ALBUMIN:5 in the last 168 hours No results found for this basename: LIPASE:5,AMYLASE:5 in the last 168 hours No results found for this basename: AMMONIA:5 in the last 168 hours CBC:  Lab 02/17/12 0900 02/16/12 1050 02/14/12 0615 02/13/12 0920 02/12/12 0705  WBC 9.6 13.2* 13.5* 12.2* 17.5*  NEUTROABS -- -- -- -- --  HGB 12.4* 13.2 12.9* 14.1 14.3  HCT 37.1* 39.2 38.3* 40.7 40.3  MCV 89.4 89.1 88.7 88.9 88.4  PLT 224 241 261  280 281   Cardiac Enzymes: No results found for this basename: CKTOTAL:5,CKMB:5,CKMBINDEX:5,TROPONINI:5 in the last 168 hours BNP (last 3 results) No results found for this basename: PROBNP:3 in the last 8760 hours CBG: No results found for this basename: GLUCAP:5 in the last 168 hours  Recent Results (from the past 240 hour(s))  MRSA PCR SCREENING     Status: Normal   Collection Time   02/09/12  3:40 PM      Component Value Range Status Comment   MRSA by PCR NEGATIVE  NEGATIVE Final   URINE CULTURE     Status: Normal   Collection Time   02/09/12  6:51 PM      Component Value Range Status Comment   Specimen Description URINE, CLEAN CATCH   Final    Special Requests NONE   Final    Culture  Setup Time 02/09/2012 19:20   Final    Colony Count NO GROWTH   Final    Culture NO GROWTH   Final    Report Status 02/11/2012 FINAL   Final   CULTURE, BLOOD (SINGLE)     Status: Normal (Preliminary result)   Collection Time   02/12/12 12:50 PM      Component Value Range Status Comment   Specimen Description BLOOD RIGHT ARM   Final    Special Requests BOTTLES DRAWN AEROBIC AND ANAEROBIC 10CC   Final    Culture  Setup Time 02/12/2012 19:22   Final    Culture     Final    Value:        BLOOD CULTURE RECEIVED NO GROWTH TO DATE CULTURE WILL BE HELD FOR 5 DAYS BEFORE ISSUING A FINAL NEGATIVE REPORT   Report Status PENDING   Incomplete   CLOSTRIDIUM DIFFICILE BY PCR     Status: Normal   Collection Time   02/12/12  5:25 PM      Component Value Range Status Comment   C difficile by pcr NEGATIVE  NEGATIVE Final      Studies: Dg Tibia/fibula Right  02/09/2012  *RADIOLOGY REPORT*  Clinical Data: 56 year old male cellulitis swelling and pain.  RIGHT TIBIA AND FIBULA - 2 VIEW  Comparison: None.  Findings: Diffuse.  Diffuse soft tissue swelling stranding in the visualized right lower extremity. Bone mineralization is within normal limits.  Right tibia and fibula appear intact.  Degenerative changes at the  right knee.  Less pronounced degenerative changes at the right ankle.  No subcutaneous gas. No radiopaque foreign body identified.  IMPRESSION: No acute osseous abnormality identified about the right tib-fib. Soft tissue swelling and stranding.  Original Report Authenticated By: Ulla Potash III, M.D.    Scheduled Meds:    . amLODipine  10 mg Oral Daily  . aspirin EC  81 mg Oral Daily  . enoxaparin (LOVENOX) injection  100 mg Subcutaneous QHS  . furosemide  40 mg Oral Daily  . irbesartan  300 mg Oral Daily  .  simvastatin  40 mg Oral QHS  . vancomycin  1,500 mg Intravenous Q12H  . venlafaxine XR  75 mg Oral Q breakfast   Continuous Infusions:     Shanti Agresti [facility] Pager [phone]  If 7PM-7AM, please contact night-coverage [email or web address] Password [email or web address] 02/18/2012, 8:27 AM   LOS: 9 days

## 2012-02-18 NOTE — Progress Notes (Signed)
Hydrotherapy Note   02/18/12 1500  Subjective Assessment  Subjective I'm just so frustrated because I have only seen my nurse twice today.    Evaluation and Treatment  Evaluation and Treatment Procedures Explained to Patient/Family Yes  Evaluation and Treatment Procedures agreed to  Wound 02/09/12  Leg Right;Circumferential Red, swollen, warm to touch and weeping Cellulitis  Date First Assessed/Time First Assessed: 02/09/12 1500   Wound Type: (c)   Location: Leg  Location Orientation: Right;Circumferential  Wound Description (Comments): Red, swollen, warm to touch and weeping Cellulitis  Present on Admission: Yes  Site / Wound Assessment Red;Yellow  % Wound base Red or Granulating 50%  % Wound base Yellow 50%  % Wound base Black 0%  % Wound base Other (Comment) 0%  Margins Attached edges (approximated)  Drainage Amount Moderate  Drainage Description Serous  Non-staged Wound Description Not applicable  Treatment Debridement (Selective);Hydrotherapy (Pulse lavage)  Dressing Type ABD;Compression wrap;Gauze (Comment) (Aquacel, Mepitel, ABDs, Kerlix, Ace)  Dressing Changed New  Dressing Status Clean;Dry;Intact  Hydrotherapy  Pulsed Lavage with Suction (psi) 4 psi  Pulsed Lavage with Suction - Normal Saline Used 2000 mL  Pulsed Lavage Tip Tip with splash shield  Pulsed lavage therapy - wound location Right LE  Selective Debridement  Selective Debridement - Location Right LE  Selective Debridement - Tools Used Forceps;Scissors  Selective Debridement - Tissue Removed Slough and non-viable skin.  Wound Therapy - Assess/Plan/Recommendations  Wound Therapy - Clinical Statement Pt is a 56 y/o male with right LE cellulitis resulting in circumferential wound with increased drainage and slough as well as non-viable skin.  Pt would benefit from hydrotherapy to assist with debridement while facilitating healing.  Wound Therapy - Frequency 6X / week  Wound Therapy - Current Recommendations WOC  nurse  Wound Therapy - Follow Up Recommendations Home health RN  Wound Plan Pulse lavage, Selective debridement, dressing change.  Wound Therapy Goals - Improve the function of patient's integumentary system by progressing the wound(s) through the phases of wound healing by:  Decrease Necrotic Tissue - Progress Progressing toward goal  Increase Granulation Tissue - Progress Progressing toward goal  Improve Drainage Characteristics - Progress Progressing toward goal    Mack Hook, PT [phone]

## 2012-02-18 NOTE — Progress Notes (Signed)
Placed patient on CPAP at 8cm Sp02=96% will continue monitor.

## 2012-02-19 LAB — VANCOMYCIN, TROUGH: Vancomycin Tr: 13 ug/mL (ref 10.0–20.0)

## 2012-02-19 NOTE — Progress Notes (Signed)
[facility] PROGRESS NOTE  Blake Garcia ZOX:[id number] DOB: [birth year] DOA: 02/09/2012 PCP: Kerby Nora, MD  This is a late entry for date of service February 19, 2012  Principal Problem:  *Cellulitis of right leg Active Problems:  HYPERLIPIDEMIA  Morbid obesity  HYPERTENSION  SLEEP APNEA  Diastolic heart failure   Assessment/Plan:   1. Extensive cellulitis of the right leg from the ankle up to the knee: failed outpatient po therapy. There were blisters and clear fluid oozing from the affected area from the day of admission.X ray of the right LE shows soft tissue swelling and venous duplex did not show any svt or DVT. Surgery input appreciated, no indication of any procedure  patient on iv vanco  Day8, unasyn was stopped by ID. Pt has been spiking fever until 7/14 on antibiotics, ID consult was obtained. Recommended c diff pcr which came back negative, Hiv antibody was negative. cxr negative for pneumonia, urine cultures negative. Last VANCO trough was 13. Patient has now sloughing off of his necrotic skin - we shall continue intravenous antibiotics for now and local wound debridement. Patient was started on hydrotherapy on February 17, 2012 for more aggressive wound debridement.  2. Hypertension: better controlled. Resumed home medication on 7/19.  3. Hyperlipidemia: resumed home simvastatin.  4. Sleep apnea: continue CPAP at night.  5. Acute renal failure:On admission the creatinine was elevated at 1.96. Patient was taken off ARB and HCTZ and started on iv fluids. ARF resolved by 7/14 with iv fluids. US kidney does not show hydronephrosis.  6. Leukocytosis: probably from the cellulitis. Improved slowly from 21.9 k on admit to 12.2 k on 7/15. Up to 13.5 on 7/16  Back down to 13.2 on July 18. It finally resulted by July 19 7. Hypokalemia and hyponatremia: from HCTZ. Patient was started on gentle hydration and had K repleted as needed. Magnesium level was checked and it was normal.  8.  Morbid obesity: BMI >69. 9. Chronic statis dermatitis: wound care and HHRN on discharge for daily dressing change. 10. Bilateral pedal edema and shortness of breath: Echo showed good left ventricular systolic function, but moderate diastolic dysfunction. Patient received intermittent iv lasix for diuresis from July 16 until July 18. Started on oral furosemide on July 19   Consultants:  Surgery consult  Wound care consult.  Id consult  Procedures:  cxr   XR of the right leg  US kidney.  Antibiotics:  Vancomycin 7/11-  HPI/Subjective: No leg pain, decreased drainage  Objective: Filed Vitals:   02/18/12 1457 02/18/12 1958 02/18/12 2245 02/19/12 0630  BP: 126/65 146/76  155/84  Pulse: 101 93  101  Temp: 99.5 F (37.5 C) 99.9 F (37.7 C) 99.8 F (37.7 C) 99.5 F (37.5 C)  TempSrc: Oral Oral  Oral  Resp: 18 18  18   Height:      Weight:      SpO2: 96% 96%  95%    Intake/Output Summary (Last 24 hours) at 02/19/12 0809 Last data filed at 02/18/12 1700  Gross per 24 hour  Intake    560 ml  Output      0 ml  Net    560 ml    Exam:  Alert and oriented x3 Chest clear to auscultation Heart regular without murmurs rubs or gallops Right lower extremity with erythema decrease sloughing decreased amount of oozing also some new blistering Data Reviewed: Basic Metabolic Panel:  Lab 02/16/12 8119 02/14/12 0615 02/13/12 0920 02/12/12 1250  NA 135 132*  134* 131*  K 4.0 5.0 3.8 3.5  CL 97 95* 95* 92*  CO2 29 28 26 30   GLUCOSE 113* 117* 173* 141*  BUN 10 9 9 10   CREATININE 0.92 0.97 0.84 0.89  CALCIUM 8.5 8.5 8.7 8.8  MG -- -- -- --  PHOS -- -- -- --   Liver Function Tests: No results found for this basename: AST:5,ALT:5,ALKPHOS:5,BILITOT:5,PROT:5,ALBUMIN:5 in the last 168 hours No results found for this basename: LIPASE:5,AMYLASE:5 in the last 168 hours No results found for this basename: AMMONIA:5 in the last 168 hours CBC:  Lab 02/17/12 0900 02/16/12 1050  02/14/12 0615 02/13/12 0920  WBC 9.6 13.2* 13.5* 12.2*  NEUTROABS -- -- -- --  HGB 12.4* 13.2 12.9* 14.1  HCT 37.1* 39.2 38.3* 40.7  MCV 89.4 89.1 88.7 88.9  PLT 224 241 261 280   Cardiac Enzymes: No results found for this basename: CKTOTAL:5,CKMB:5,CKMBINDEX:5,TROPONINI:5 in the last 168 hours BNP (last 3 results) No results found for this basename: PROBNP:3 in the last 8760 hours CBG: No results found for this basename: GLUCAP:5 in the last 168 hours  Recent Results (from the past 240 hour(s))  MRSA PCR SCREENING     Status: Normal   Collection Time   02/09/12  3:40 PM      Component Value Range Status Comment   MRSA by PCR NEGATIVE  NEGATIVE Final   URINE CULTURE     Status: Normal   Collection Time   02/09/12  6:51 PM      Component Value Range Status Comment   Specimen Description URINE, CLEAN CATCH   Final    Special Requests NONE   Final    Culture  Setup Time 02/09/2012 19:20   Final    Colony Count NO GROWTH   Final    Culture NO GROWTH   Final    Report Status 02/11/2012 FINAL   Final   CULTURE, BLOOD (SINGLE)     Status: Normal   Collection Time   02/12/12 12:50 PM      Component Value Range Status Comment   Specimen Description BLOOD RIGHT ARM   Final    Special Requests BOTTLES DRAWN AEROBIC AND ANAEROBIC 10CC   Final    Culture  Setup Time 02/12/2012 19:22   Final    Culture NO GROWTH 5 DAYS   Final    Report Status 02/18/2012 FINAL   Final   CLOSTRIDIUM DIFFICILE BY PCR     Status: Normal   Collection Time   02/12/12  5:25 PM      Component Value Range Status Comment   C difficile by pcr NEGATIVE  NEGATIVE Final      Studies: Dg Tibia/fibula Right  02/09/2012  *RADIOLOGY REPORT*  Clinical Data: 56 year old male cellulitis swelling and pain.  RIGHT TIBIA AND FIBULA - 2 VIEW  Comparison: None.  Findings: Diffuse.  Diffuse soft tissue swelling stranding in the visualized right lower extremity. Bone mineralization is within normal limits.  Right tibia and  fibula appear intact.  Degenerative changes at the right knee.  Less pronounced degenerative changes at the right ankle.  No subcutaneous gas. No radiopaque foreign body identified.  IMPRESSION: No acute osseous abnormality identified about the right tib-fib. Soft tissue swelling and stranding.  Original Report Authenticated By: Ulla Potash III, M.D.    Scheduled Meds:    . amLODipine  10 mg Oral Daily  . aspirin EC  81 mg Oral Daily  . enoxaparin (LOVENOX) injection  100  mg Subcutaneous QHS  . furosemide  40 mg Oral Daily  . irbesartan  300 mg Oral Daily  . simvastatin  40 mg Oral QHS  . vancomycin  1,500 mg Intravenous Q12H  . venlafaxine XR  75 mg Oral Q breakfast   Continuous Infusions:     Blake Garcia [facility] Pager [phone]  If 7PM-7AM, please contact night-coverage [email or web address] Password [email or web address] 02/19/2012, 8:09 AM   LOS: 10 days

## 2012-02-19 NOTE — Progress Notes (Signed)
56yo male remains therapeutic on vancomycin for cellulitis with trough of 13 (goal 10-15).  Will continue at current dose and monitor.  Vernard Gambles, PharmD, BCPS 02/19/2012 4:39 AM

## 2012-02-20 LAB — CBC
MCH: 29.6 pg (ref 26.0–34.0)
MCV: 89.2 fL (ref 78.0–100.0)
Platelets: 300 10*3/uL (ref 150–400)
RBC: 3.99 MIL/uL — ABNORMAL LOW (ref 4.22–5.81)
RDW: 14.4 % (ref 11.5–15.5)

## 2012-02-20 LAB — BASIC METABOLIC PANEL
Calcium: 8.6 mg/dL (ref 8.4–10.5)
Creatinine, Ser: 0.77 mg/dL (ref 0.50–1.35)
GFR calc non Af Amer: >90 mL/min (ref >90)
Glucose, Bld: 109 mg/dL — ABNORMAL HIGH (ref 70–99)
Sodium: 135 mEq/L (ref 135–145)

## 2012-02-20 NOTE — Progress Notes (Signed)
Hydrotherapy Note:   02/20/12 0942  Subjective Assessment  Subjective "I'm concerned that it looks worse."  Patient and Family Stated Goals Heal wound.  Date of Onset 02/10/12  Prior Treatments Outpatient wound center, WOC, debridement, dressing changes, antibiotics.  Evaluation and Treatment  Evaluation and Treatment Procedures Explained to Patient/Family Yes  Evaluation and Treatment Procedures agreed to  Wound 02/09/12  Leg Right;Circumferential Red, swollen, warm to touch and weeping Cellulitis  Date First Assessed/Time First Assessed: 02/09/12 1500   Wound Type: (c)   Location: Leg  Location Orientation: Right;Circumferential  Wound Description (Comments): Red, swollen, warm to touch and weeping Cellulitis  Present on Admission: Yes  Site / Wound Assessment Red;Yellow  % Wound base Red or Granulating 50%  % Wound base Yellow 50%  % Wound base Black 0%  % Wound base Other (Comment) 0%  Margins Attached edges (approximated)  Drainage Amount Moderate  Drainage Description Serous  Non-staged Wound Description Not applicable  Treatment Debridement (Selective);Hydrotherapy (Pulse lavage)  Dressing Type Gauze (Comment);ABD;Compression wrap (Aquacel, Mepitel, Abd, Kerlix, Ace)  Dressing Changed New  Dressing Status Clean;Dry;Intact  Hydrotherapy  Pulsed Lavage with Suction (psi) 8 psi  Pulsed Lavage with Suction - Normal Saline Used 1000 mL  Pulsed Lavage Tip Tip with splash shield  Pulsed lavage therapy - wound location Right LE  Selective Debridement  Selective Debridement - Location Right LE  Selective Debridement - Tools Used Forceps;Scissors  Selective Debridement - Tissue Removed Slough and non-viable skin.  Wound Therapy - Assess/Plan/Recommendations  Wound Therapy - Clinical Statement Pt is a 56 y/o male with right LE cellulitis resulting in circumferential wound with increased drainage and slough as well as non-viable skin.  Pt would benefit from hydrotherapy to assist with  debridement while facilitating healing.  Wound Therapy - Functional Problem List Patient with decreased skin integrity causing increased chance of infection.  Factors Delaying/Impairing Wound Healing Infection - systemic/local;Multiple medical problems  Hydrotherapy Plan Debridement;Dressing change;Patient/family education;Pulsatile lavage with suction  Wound Therapy - Frequency 6X / week  Wound Therapy - Current Recommendations WOC nurse  Wound Therapy - Follow Up Recommendations Home health RN  Wound Plan Pulse lavage, Selective debridement, dressing change.  Wound Therapy Goals - Improve the function of patient's integumentary system by progressing the wound(s) through the phases of wound healing by:  Decrease Necrotic Tissue - Progress Progressing toward goal  Increase Granulation Tissue - Progress Progressing toward goal  Improve Drainage Characteristics - Progress Progressing toward goal  Goals/treatment plan/discharge plan were made with and agreed upon by patient/family Yes  Wound Therapy - Potential for Goals Good    Pain:  None.  Pt premedicated.  02/20/2012 [name], PT, DPT [phone]

## 2012-02-20 NOTE — Progress Notes (Signed)
[facility] PROGRESS NOTE  Blake Garcia ZOX:[id number] DOB: 10/11/1955 DOA: 02/09/2012 PCP: Kerby Nora, MD Principal Problem:  *Cellulitis of right leg Active Problems:  HYPERLIPIDEMIA  Morbid obesity  HYPERTENSION  SLEEP APNEA  Diastolic heart failure   Assessment/Plan:   1. Extensive cellulitis of the right leg from the ankle up to the knee: failed outpatient po therapy. There were blisters and clear fluid oozing from the affected area from the day of admission.X ray of the right LE shows soft tissue swelling and venous duplex did not show any svt or DVT. Surgery input appreciated, no indication of any procedure  patient on iv vanco  Day8, unasyn was stopped by ID. Pt has been spiking fever until 7/14 on antibiotics, ID consult was obtained. Recommended c diff pcr which came back negative, Hiv antibody was negative. cxr negative for pneumonia, urine cultures negative. Last VANCO trough was 13. Patient has now sloughing off of his necrotic skin - we shall continue intravenous antibiotics for now and local wound debridement. Patient was started on hydrotherapy on February 17, 2012 for more aggressive wound debridement. He continues to have areas of active sloughing but overall he is making good progress. The erythema and edema of the right lower extremity is still quite persistent. We will plan for one more week of IV antibiotics at home. Patient to discharge tomorrow after new session of hydrotherapy  2. Hypertension: better controlled. Resumed home medication on 7/19.  3. Hyperlipidemia: resumed home simvastatin.  4. Sleep apnea: continue CPAP at night.  5. Acute renal failure:On admission the creatinine was elevated at 1.96. Patient was taken off ARB and HCTZ and started on iv fluids. ARF resolved by 7/14 with iv fluids. US kidney does not show hydronephrosis.  6. Leukocytosis: probably from the cellulitis. Improved slowly from 21.9 k on admit to 12.2 k on 7/15. Up to 13.5 on 7/16  Back  down to 13.2 on July 18. It finally resulted by July 19 7. Hypokalemia and hyponatremia: from HCTZ. Patient was started on gentle hydration and had K repleted as needed. Magnesium level was checked and it was normal.  8. Morbid obesity: BMI >69. 9. Chronic statis dermatitis: wound care and HHRN on discharge for daily dressing change. 10. Bilateral pedal edema and shortness of breath: Echo showed good left ventricular systolic function, but moderate diastolic dysfunction. Patient received intermittent iv lasix for diuresis from July 16 until July 18. Started on oral furosemide on July 19   Consultants:  Surgery consult  Wound care consult.  Id consult  Procedures:  cxr   XR of the right leg  US kidney.  Antibiotics:  Vancomycin 7/11-  HPI/Subjective: No new events Objective: Filed Vitals:   02/19/12 1959 02/19/12 2330 02/20/12 0616 02/20/12 1528  BP: 131/73  147/76 134/86  Pulse: 96 90 98 86  Temp: 98.8 F (37.1 C)  98.1 F (36.7 C) 98.2 F (36.8 C)  TempSrc: Oral  Oral Oral  Resp: 18 18 18 18   Height:      Weight:      SpO2: 96% 96% 95% 96%    Intake/Output Summary (Last 24 hours) at 02/20/12 1534 Last data filed at 02/20/12 1100  Gross per 24 hour  Intake    240 ml  Output      0 ml  Net    240 ml    Exam:  Alert and oriented x3 Chest clear to auscultation Heart regular without murmurs rubs or gallops Right lower extremity with erythema  Data Reviewed: Basic Metabolic Panel:  Lab 02/20/12 4098 02/16/12 0500 02/14/12 0615  NA 135 135 132*  K 3.6 4.0 5.0  CL 99 97 95*  CO2 27 29 28   GLUCOSE 109* 113* 117*  BUN 8 10 9   CREATININE 0.77 0.92 0.97  CALCIUM 8.6 8.5 8.5  MG -- -- --  PHOS -- -- --   Liver Function Tests: No results found for this basename: AST:5,ALT:5,ALKPHOS:5,BILITOT:5,PROT:5,ALBUMIN:5 in the last 168 hours No results found for this basename: LIPASE:5,AMYLASE:5 in the last 168 hours No results found for this basename:  AMMONIA:5 in the last 168 hours CBC:  Lab 02/20/12 0515 02/17/12 0900 02/16/12 1050 02/14/12 0615  WBC 9.1 9.6 13.2* 13.5*  NEUTROABS -- -- -- --  HGB 11.8* 12.4* 13.2 12.9*  HCT 35.6* 37.1* 39.2 38.3*  MCV 89.2 89.4 89.1 88.7  PLT 300 224 241 261   Cardiac Enzymes: No results found for this basename: CKTOTAL:5,CKMB:5,CKMBINDEX:5,TROPONINI:5 in the last 168 hours BNP (last 3 results) No results found for this basename: PROBNP:3 in the last 8760 hours CBG: No results found for this basename: GLUCAP:5 in the last 168 hours  Recent Results (from the past 240 hour(s))  CULTURE, BLOOD (SINGLE)     Status: Normal   Collection Time   02/12/12 12:50 PM      Component Value Range Status Comment   Specimen Description BLOOD RIGHT ARM   Final    Special Requests BOTTLES DRAWN AEROBIC AND ANAEROBIC 10CC   Final    Culture  Setup Time 02/12/2012 19:22   Final    Culture NO GROWTH 5 DAYS   Final    Report Status 02/18/2012 FINAL   Final   CLOSTRIDIUM DIFFICILE BY PCR     Status: Normal   Collection Time   02/12/12  5:25 PM      Component Value Range Status Comment   C difficile by pcr NEGATIVE  NEGATIVE Final      Studies: Dg Tibia/fibula Right  02/09/2012  *RADIOLOGY REPORT*  Clinical Data: 56 year old male cellulitis swelling and pain.  RIGHT TIBIA AND FIBULA - 2 VIEW  Comparison: None.  Findings: Diffuse.  Diffuse soft tissue swelling stranding in the visualized right lower extremity. Bone mineralization is within normal limits.  Right tibia and fibula appear intact.  Degenerative changes at the right knee.  Less pronounced degenerative changes at the right ankle.  No subcutaneous gas. No radiopaque foreign body identified.  IMPRESSION: No acute osseous abnormality identified about the right tib-fib. Soft tissue swelling and stranding.  Original Report Authenticated By: Ulla Potash III, M.D.    Scheduled Meds:    . amLODipine  10 mg Oral Daily  . aspirin EC  81 mg Oral Daily  .  enoxaparin (LOVENOX) injection  100 mg Subcutaneous QHS  . furosemide  40 mg Oral Daily  . irbesartan  300 mg Oral Daily  . simvastatin  40 mg Oral QHS  . vancomycin  1,500 mg Intravenous Q12H  . venlafaxine XR  75 mg Oral Q breakfast   Continuous Infusions:     Blake Garcia [facility] Pager [phone]  If 7PM-7AM, please contact night-coverage [email or web address] Password [facility] 02/20/2012, 3:34 PM   LOS: 11 days

## 2012-02-20 NOTE — Discharge Instructions (Signed)
Home Health RN for abx-[facility]. Contact ph# [phone] Appt. With [facility] on July 31th, at [facility], will need to be there @ 8:45am. Ph# for [facility] is [phone].

## 2012-02-20 NOTE — Consult Note (Signed)
WOC follow up Requested to eval. Pt with Dr. Lavera Guise today to make recommendations for discharge plans for RLE wounds.  Hydrotherapy per PT.  I have discussed case with PT that performed hydrotherapy this am and she reports that she worked on some of the loose non viable tissue on the medial calf today.    I have assessed the leg today with Dr. Lavera Guise and the [facility] is about the same. The areas of blistering are improving and only the medial calf still appears to have some loosening and oozing of serous fluid. The drainage on the bandages was improved and had not saturated thru to the ACE wraps. The drainage remains serous and has no odor or discoloration at the time of my assessment today. The leg is not oozing near as much as before and appears only to need further hydrotherapy medially.  Agreed with Dr. Lavera Guise to continue hydrotherapy tomorrow am and I will assess with PT at that time.  Plans for dc home with [facility] for dressings and IV abtx and follow up in the wound care center for continued management of these wounds.  WOC will follow along with you for management of RLE wounds Tashonda Pinkus Marlena Clipper, [employer] [phone]

## 2012-02-21 DIAGNOSIS — C4491 Basal cell carcinoma of skin, unspecified: Secondary | ICD-10-CM | POA: Diagnosis present

## 2012-02-21 MED ORDER — HEPARIN SOD (PORK) LOCK FLUSH 100 UNIT/ML IV SOLN
250.0000 [IU] | Freq: Every day | INTRAVENOUS | Status: DC
  Filled 2012-02-21: qty 3

## 2012-02-21 MED ORDER — OXYCODONE HCL 5 MG PO TABS: 5.0000 mg | ORAL_TABLET | ORAL | Status: AC | PRN

## 2012-02-21 MED ORDER — VANCOMYCIN HCL 1000 MG IV SOLR: 1500.0000 mg | Freq: Two times a day (BID) | INTRAVENOUS | Status: AC

## 2012-02-21 MED ORDER — HEPARIN SOD (PORK) LOCK FLUSH 100 UNIT/ML IV SOLN
250.0000 [IU] | INTRAVENOUS | Status: DC | PRN
  Administered 2012-02-21: 250 [IU]
  Filled 2012-02-21: qty 3

## 2012-02-21 NOTE — Progress Notes (Signed)
ANTICOAGULATION AND ANTIBIOTIC CONSULT NOTE - FOLLOW UP  Pharmacy Consult:  Vancomycin and Lovenox Indication: RLE cellulitis and VTE prophylaxis  No Known Allergies  Patient Measurements: Height: 5' 6.93" (170 cm) Weight: 432 lb (195.954 kg) IBW/kg (Calculated) : 65.94   Vital Signs: Temp: 98.4 F (36.9 C) (07/23 0640) Temp src: Oral (07/23 0640) BP: 144/78 mmHg (07/23 0640) Pulse Rate: 107  (07/23 0640)  Labs:  Basename 02/20/12 0515  WBC 9.1  HGB 11.8*  PLT 300  LABCREA --  CREATININE 0.77   Estimated Creatinine Clearance: 174 ml/min (by C-G formula based on Cr of 0.77).  Basename 02/19/12 0340  VANCOTROUGH 13.0  VANCOPEAK --  Drue Dun --  GENTTROUGH --  GENTPEAK --  GENTRANDOM --  TOBRATROUGH --  TOBRAPEAK --  TOBRARND --  AMIKACINPEAK --  AMIKACINTROU --  AMIKACIN --     Microbiology: Recent Results (from the past 720 hour(s))  URINE CULTURE     Status: Normal   Collection Time   02/07/12 10:42 AM      Component Value Range Status Comment   Colony Count NO GROWTH   Final    Organism ID, Bacteria NO GROWTH   Final   MRSA PCR SCREENING     Status: Normal   Collection Time   02/09/12  3:40 PM      Component Value Range Status Comment   MRSA by PCR NEGATIVE  NEGATIVE Final   URINE CULTURE     Status: Normal   Collection Time   02/09/12  6:51 PM      Component Value Range Status Comment   Specimen Description URINE, CLEAN CATCH   Final    Special Requests NONE   Final    Culture  Setup Time 02/09/2012 19:20   Final    Colony Count NO GROWTH   Final    Culture NO GROWTH   Final    Report Status 02/11/2012 FINAL   Final   CULTURE, BLOOD (SINGLE)     Status: Normal   Collection Time   02/12/12 12:50 PM      Component Value Range Status Comment   Specimen Description BLOOD RIGHT ARM   Final    Special Requests BOTTLES DRAWN AEROBIC AND ANAEROBIC 10CC   Final    Culture  Setup Time 02/12/2012 19:22   Final    Culture NO GROWTH 5 DAYS   Final    Report Status 02/18/2012 FINAL   Final   CLOSTRIDIUM DIFFICILE BY PCR     Status: Normal   Collection Time   02/12/12  5:25 PM      Component Value Range Status Comment   C difficile by pcr NEGATIVE  NEGATIVE Final        Assessment: 56 yo obese male on day#12 of vancomycin for RLE cellulitis. ARF has resolved and SCr is back to baseline. Patient is afebrile, culture data is negative, and WBC has normalized.  Last vanc trough was therapeutic on 02/19/12. Noted plan to D/c today to finish an additional week of home vancomycin. Further monitoring of levels should not be necessary to finish the course if pt's renal fx remains stable.   Vanc 7/11>>7/30 Unasyn 7/11>>7/15  7/11 urine - negative  7/14 Blood-neg 7/14 Cdiff- neg HIV Ab neg   Patient is also on Lovenox for VTE prophylaxis. H/H have trended down since admit but no bleeding has been reported. Platelets are normal.  Renal function is appropriate for current Lovenox dosing.   Goal of  Therapy:  Vancomycin trough level 10-15 mcg/ml   Plan:  - Continue vanc 1500mg  IV q12h x 7 days - Continue Lovenox 100 mg SQ q24h (0.5 mg/kg/day) for BMI > 30    Tyler Robidoux R. Darin Engels.D. Clinical Pharmacist Pager [phone] Phone [phone] 02/21/2012 8:59 AM

## 2012-02-21 NOTE — Progress Notes (Signed)
Hydrotherapy:   02/21/12 0800  Subjective Assessment  Subjective "I'm excited, because it's looking better and we have a plan."  Patient and Family Stated Goals Heal wound.  Date of Onset 02/10/12  Prior Treatments Outpatient wound center, WOC, debridement, dressing changes, antibiotics.  Evaluation and Treatment  Evaluation and Treatment Procedures Explained to Patient/Family Yes  Evaluation and Treatment Procedures agreed to  Wound 02/09/12  Leg Right;Circumferential Red, swollen, warm to touch and weeping Cellulitis  Date First Assessed/Time First Assessed: 02/09/12 1500   Wound Type: (c)   Location: Leg  Location Orientation: Right;Circumferential  Wound Description (Comments): Red, swollen, warm to touch and weeping Cellulitis  Present on Admission: Yes  Site / Wound Assessment Red;Yellow  % Wound base Red or Granulating 50%  % Wound base Yellow 40%  % Wound base Black 0%  % Wound base Other (Comment) 10% (Scabbing)  Margins Attached edges (approximated)  Closure None  Drainage Amount Moderate  Drainage Description Serous  Non-staged Wound Description Not applicable  Treatment Debridement (Selective);Hydrotherapy (Pulse lavage)  Dressing Type Gauze (Comment);ABD;Compression wrap (Aquacel, Mepitel, Abd, Kerlix, ACE)  Dressing Changed New  Dressing Status Clean;Dry;Intact  Hydrotherapy  Pulsed Lavage with Suction (psi) 8 psi  Pulsed Lavage with Suction - Normal Saline Used 1000 mL  Pulsed Lavage Tip Tip with splash shield  Pulsed lavage therapy - wound location Right LE  Selective Debridement  Selective Debridement - Location Right LE  Selective Debridement - Tools Used Forceps;Scissors  Selective Debridement - Tissue Removed Slough and non-viable skin.  Wound Therapy - Assess/Plan/Recommendations  Wound Therapy - Clinical Statement Pt is a 56 y/o male with right LE cellulitis resulting in circumferential wound with increased drainage and slough as well as non-viable skin.   Pt would benefit from hydrotherapy to assist with debridement while facilitating healing.  Wound Therapy - Functional Problem List Patient with decreased skin integrity causing increased chance of infection.  Factors Delaying/Impairing Wound Healing Infection - systemic/local;Multiple medical problems  Hydrotherapy Plan Debridement;Dressing change;Patient/family education;Pulsatile lavage with suction  Wound Therapy - Frequency 6X / week  Wound Therapy - Current Recommendations WOC nurse  Wound Therapy - Follow Up Recommendations Home health RN  Wound Plan Pulse lavage, Selective debridement, dressing change.  Wound Therapy Goals - Improve the function of patient's integumentary system by progressing the wound(s) through the phases of wound healing by:  Decrease Necrotic Tissue - Progress Progressing toward goal  Increase Granulation Tissue - Progress Progressing toward goal  Improve Drainage Characteristics - Progress Progressing toward goal  Goals/treatment plan/discharge plan were made with and agreed upon by patient/family Yes  Wound Therapy - Potential for Goals Good    Pain:  No c/o this am with treatment.  02/21/2012 [name], PT, DPT [phone]

## 2012-02-21 NOTE — Discharge Summary (Signed)
Physician Discharge Summary  Blake Garcia HYQ:[id number] DOB: [birth year] DOA: 02/09/2012  PCP: Kerby Nora, MD  Admit date: 02/09/2012 Discharge date: 02/21/2012  Recommendations for Outpatient Follow-up:  1. Patient is scheduled to followup at the wound care clinic 2. The patient is currently on IV antibiotics 3. Needs outpatient dermatology follow up for scalp lesion   Discharge Diagnoses:  Extensive Cellulitis of right leg - with systemic symptoms and loss of skin integrity  HYPERLIPIDEMIA  Morbid obesity  HYPERTENSION  SLEEP APNEA Acute on chronic Diastolic heart failure  Basal cell cancer scalp needs outpatient dermatology followup  Discharge Condition: Good  Diet recommendation: Heart healthy  History of present illness:  56 year old gentleman came in from doctors office as his cellulitis hasn't been improving despite being on oral antibiotics. He reports some cellulitis on the RLE about an inch long with some erythematous streaks on the RLE on 7/4. He went to pcp got a prescrption for bactrim on 7/9, but today he saw that his leg was draining clear fluid and went to see his PCP, and he was referred to [name] for admission for IV antibiotics for worsening cellulitis. He reports occasional fever. No other complaints. No trauma to the leg .   Hospital Course:  1. Extensive cellulitis of the right leg from the ankle up to the knee: failed outpatient po therapy. There were blisters and clear fluid oozing from the affected area from the day of admission.X ray of the right LE shows soft tissue swelling and venous duplex did not show any svt or DVT. Surgery input appreciated, no indication of any procedure patient was treated with iv vancomycin throughout his hospital stay . Initially she was also started on unasyn which was stopped by ID. Pt has been spiking fever until 7/14 on antibiotics, ID consult was obtained. Recommended c diff pcr which came back negative, Hiv antibody was negative. cxr  negative for pneumonia, urine cultures negative. Last VANCO trough was 13. Patient had now sloughing off of his necrotic skin - we shall continue intravenous antibiotics for now and local wound care Patient was started on hydrotherapy on February 17, 2012 for more aggressive wound debridement.   2. Hypertension: Initially we had to hold medications. Resumed home medication on 7/19 and blood pressure was well controlled 3. Hyperlipidemia: resumed home simvastatin throughout hospitalization 4. Sleep apnea: continued on CPAP at night. Throughout hospitalization 5. Acute renal failure:On admission the creatinine was elevated at 1.96. Patient was taken off ARB and HCTZ and started on iv fluids. ARF resolved by 7/14 with iv fluids. US kidney did not show hydronephrosis. ARB resumed 6. Hypokalemia and hyponatremia: from HCTZ. Patient was started on gentle hydration and had K repleted as needed. Magnesium level was checked and it was normal. Safe to resume HCTZ at the time of the discharge 8. Morbid obesity: BMI >69.  9. acute on chronic diastolic congestive heart failure  - after IV fluid resuscitation the patient developed bilateral pedal edema and shortness of breath: Echo showed good left ventricular systolic function, but moderate diastolic dysfunction. Patient received intermittent iv lasix for diuresis from July 16 until July 18. Started on oral furosemide on July 19. At the discharge we resumed HCTZ.       Procedures:  CT scan leg  PICC line  Consultations:  Surgery  Infectious disease  Discharge Exam: Filed Vitals:   02/21/12 0640  BP: 144/78  Pulse: 107  Temp: 98.4 F (36.9 C)  Resp: 20   Filed  Vitals:   02/20/12 0616 02/20/12 1528 02/20/12 2030 02/21/12 0640  BP: 147/76 134/86 151/79 144/78  Pulse: 98 86 102 107  Temp: 98.1 F (36.7 C) 98.2 F (36.8 C) 99.8 F (37.7 C) 98.4 F (36.9 C)  TempSrc: Oral Oral Oral Oral  Resp: 18 18 18 20   Height:      Weight:      SpO2: 95%  96% 96% 96%   General: Alert oriented x3 Cardiovascular: Regular rate and rhythm no murmurs rubs or gallops Respiratory: Clear bilaterally Right lower extremity has extensive erythema, no further blisters, beefy red appearance consistent with granulation tissue.   Discharge Instructions  Discharge Orders    Future Orders Please Complete By Expires   Diet - low sodium heart healthy      Increase activity slowly        Medication List  As of 02/21/2012 10:29 AM   STOP taking these medications         sulfamethoxazole-trimethoprim 800-160 MG per tablet         TAKE these medications         amLODipine 5 MG tablet   Commonly known as: NORVASC   Take 5 mg by mouth daily.      aspirin EC 81 MG tablet   Take 81 mg by mouth daily.      potassium chloride SA 20 MEQ tablet   Commonly known as: K-DUR,KLOR-CON   Take 20 mEq by mouth daily.      simvastatin 40 MG tablet   Commonly known as: ZOCOR   Take 40 mg by mouth at bedtime.      sodium chloride 0.9 % SOLN 500 mL with vancomycin 1000 MG SOLR 1,500 mg   Inject 1,500 mg into the vein every 12 (twelve) hours.      valsartan-hydrochlorothiazide 320-25 MG per tablet   Commonly known as: DIOVAN-HCT   Take 1 tablet by mouth daily.      venlafaxine XR 75 MG 24 hr capsule   Commonly known as: EFFEXOR-XR   Take 75 mg by mouth daily.           Follow-up Information    Schedule an appointment as soon as possible for a visit with Kerby Nora, MD.   Contact information:   [address] [address] [place] [address] [phone]           The results of significant diagnostics from this hospitalization (including imaging, microbiology, ancillary and laboratory) are listed below for reference.    Significant Diagnostic Studies: Dg Chest 2 View  02/12/2012  *RADIOLOGY REPORT*  Clinical Data: Cough.  Shortness of breath.  Fever.  Hypertension.  CHEST - 2 VIEW  Comparison:  02/10/2012   Findings:  The heart size and mediastinal contours are within normal limits.  Both lungs are clear.  The visualized skeletal structures are unremarkable.  IMPRESSION: No active cardiopulmonary disease.  Original Report Authenticated By: Danae Orleans, M.D.   Dg Chest 2 View  02/10/2012  *RADIOLOGY REPORT*  Clinical Data: , leukocytosis  CHEST - 2 VIEW  Comparison: None  Findings: Upper-normal size of cardiac silhouette. Mediastinal contours and pulmonary vascularity normal. Low lung volumes with mild elevation right diaphragm and minimal right basilar atelectasis. Lungs otherwise clear. No pleural effusion or pneumothorax. Bones unremarkable.  IMPRESSION: Decreased lung volumes with mild right basilar atelectasis.  Original Report Authenticated By: Lollie Marrow, M.D.   Dg Tibia/fibula Right  02/09/2012  *  RADIOLOGY REPORT*  Clinical Data: 56 year old male cellulitis swelling and pain.  RIGHT TIBIA AND FIBULA - 2 VIEW  Comparison: None.  Findings: Diffuse.  Diffuse soft tissue swelling stranding in the visualized right lower extremity. Bone mineralization is within normal limits.  Right tibia and fibula appear intact.  Degenerative changes at the right knee.  Less pronounced degenerative changes at the right ankle.  No subcutaneous gas. No radiopaque foreign body identified.  IMPRESSION: No acute osseous abnormality identified about the right tib-fib. Soft tissue swelling and stranding.  Original Report Authenticated By: Harley Hallmark, M.D.   US Renal  02/10/2012  *RADIOLOGY REPORT*  Clinical Data: 56 year old male with acute on chronic kidney disease.  RENAL/URINARY TRACT ULTRASOUND COMPLETE  Comparison:  None.  Findings:  Right Kidney:  No hydronephrosis.  Renal length 11.1 cm.  Cortical echotexture appears within normal limits.  Left Kidney:  No hydronephrosis.  Renal length approximately 12.6 cm.  Small exophytic hypoechoic area at the superior pole measuring up to 31 mm diameter appears to represent a  cyst without vascularity on Doppler interrogation.  Bladder:  Not identified, presumably decompressed.  IMPRESSION: No hydronephrosis or acute renal findings.  Benign appearing left upper pole cyst.  Original Report Authenticated By: Harley Hallmark, M.D.    Microbiology: Recent Results (from the past 240 hour(s))  CULTURE, BLOOD (SINGLE)     Status: Normal   Collection Time   02/12/12 12:50 PM      Component Value Range Status Comment   Specimen Description BLOOD RIGHT ARM   Final    Special Requests BOTTLES DRAWN AEROBIC AND ANAEROBIC 10CC   Final    Culture  Setup Time 02/12/2012 19:22   Final    Culture NO GROWTH 5 DAYS   Final    Report Status 02/18/2012 FINAL   Final   CLOSTRIDIUM DIFFICILE BY PCR     Status: Normal   Collection Time   02/12/12  5:25 PM      Component Value Range Status Comment   C difficile by pcr NEGATIVE  NEGATIVE Final      Labs: Basic Metabolic Panel:  Lab 02/20/12 4098 02/16/12 0500  NA 135 135  K 3.6 4.0  CL 99 97  CO2 27 29  GLUCOSE 109* 113*  BUN 8 10  CREATININE 0.77 0.92  CALCIUM 8.6 8.5  MG -- --  PHOS -- --   Liver Function Tests: No results found for this basename: AST:5,ALT:5,ALKPHOS:5,BILITOT:5,PROT:5,ALBUMIN:5 in the last 168 hours No results found for this basename: LIPASE:5,AMYLASE:5 in the last 168 hours No results found for this basename: AMMONIA:5 in the last 168 hours CBC:  Lab 02/20/12 0515 02/17/12 0900 02/16/12 1050  WBC 9.1 9.6 13.2*  NEUTROABS -- -- --  HGB 11.8* 12.4* 13.2  HCT 35.6* 37.1* 39.2  MCV 89.2 89.4 89.1  PLT 300 224 241   Cardiac Enzymes: No results found for this basename: CKTOTAL:5,CKMB:5,CKMBINDEX:5,TROPONINI:5 in the last 168 hours BNP: BNP (last 3 results) No results found for this basename: PROBNP:3 in the last 8760 hours CBG: No results found for this basename: GLUCAP:5 in the last 168 hours  Time coordinating discharge: 45 minutes  Signed:  Belinda Bringhurst  [facility] 02/21/2012,  10:29 AM

## 2012-02-21 NOTE — Consult Note (Signed)
WOC follow up Wound type: blisters secondary to severe cellulitis of the RLE.   Pt previously had weeping blisters of the pretibial area and the lateral proximal calf.  These areas have been successfully debrided and now the only weeping, open area continues to be the posterior and medial calf.  Pt undergoing hydrotherapy at the time of my assessment.  PT reports that when dressing removed today that only area that had saturated the silver hydrofiber was the posterior/medial calf.  We will dc the use of silver hydrofiber except in this area since no need for the extra absorbency.    Wound bed: Pink and beefy, continues to be moist and oozing but not purulent or malodorous. PT is aggressively debriding the area with CSWD and hydrotherapy  Drainage (amount, consistency, odor) serous  Periwound: intact but with continued erythema but seems to be resolving each day   Dressing procedure/placement/frequency: silver hydrofiber to the medial and posterior calf.  Covered all other affected areas with Mepitel, ABD pads to affected areas for absorbency, wrap with 2-3 layers of kerlix and ACE wrap.  Extra mepitel and silver hydrofiber sent home with patient for use  Discussed care with [facility], would recommend [facility] for wound care teaching to his friend Kennon Rounds with a goal of being independent with care by Friday of this week. Pt will also have HHRN for IV abtx teaching and PICC line dressing changes. Pt agreeable with this plan as well and has appointment with the wound care center set up for next Tuesday.  Tamatha Gadbois [place], [place] [phone]

## 2012-02-22 ENCOUNTER — Telehealth: Payer: Self-pay

## 2012-02-22 NOTE — Telephone Encounter (Signed)
Velna Hatchet nurse [facility] said #1 interaction between amlodipine and simvastatin. Pt not having problems but Velna Hatchet request what to do about interaction within 24 hours.  If Velna Hatchet has not gotten call back by tomorrow afternoon, Velna Hatchet will call back.Please advise.

## 2012-02-22 NOTE — Progress Notes (Signed)
Utilization review completed. Ceria Suminski, RN, BSN. 

## 2012-02-22 NOTE — Telephone Encounter (Signed)
As long as the patient is having no problems, it is not of significant consequence. The norvasc can raise the blood level of zocor, but as long as asymptomatic, keep on current doses.

## 2012-02-23 MED FILL — Oxycodone HCl Tab 5 MG: ORAL | Qty: 2 | Status: AC

## 2012-02-23 NOTE — Telephone Encounter (Signed)
Patient home health nurse advised

## 2012-02-28 ENCOUNTER — Telehealth: Payer: Self-pay | Admitting: *Deleted

## 2012-02-28 ENCOUNTER — Ambulatory Visit (INDEPENDENT_AMBULATORY_CARE_PROVIDER_SITE_OTHER): Payer: BC Managed Care – PPO | Admitting: Family Medicine

## 2012-02-28 ENCOUNTER — Encounter: Payer: Self-pay | Admitting: Family Medicine

## 2012-02-28 VITALS — BP 110/70 | HR 68 | Temp 98.4°F | Wt >= 6400 oz

## 2012-02-28 DIAGNOSIS — L03115 Cellulitis of right lower limb: Secondary | ICD-10-CM

## 2012-02-28 DIAGNOSIS — L989 Disorder of the skin and subcutaneous tissue, unspecified: Secondary | ICD-10-CM

## 2012-02-28 DIAGNOSIS — L02419 Cutaneous abscess of limb, unspecified: Secondary | ICD-10-CM

## 2012-02-28 DIAGNOSIS — N179 Acute kidney failure, unspecified: Secondary | ICD-10-CM

## 2012-02-28 DIAGNOSIS — I5030 Unspecified diastolic (congestive) heart failure: Secondary | ICD-10-CM

## 2012-02-28 NOTE — Assessment & Plan Note (Addendum)
Resolving but still with extensive skin changed that need to heal.. Likely does not need further vancomycin. Will leave up to wound care center.

## 2012-02-28 NOTE — Progress Notes (Signed)
Subjective:    Patient ID: Blake Garcia, male    DOB: November 18, 1955, 56 y.o.   MRN: [id number]  HPI  56 year old male presents for hospital follow up. Admitted to [facility]  Admit date: 02/09/2012  Discharge date: 02/21/2012  Recommendations for Outpatient Follow-up:  1. Patient is scheduled to followup at the wound care clinic 2. The patient is currently on IV antibiotics... Vancomycin. Home health following 3. Needs outpatient dermatology follow up for scalp lesion:  Discharge Diagnoses:   Extensive cellulitis of the right leg from the ankle up to the knee: failed outpatient po therapy.  There were blisters and clear fluid oozing from the affected area from the day of admission.X ray of the right LE shows soft tissue swelling and venous duplex did not show any svt or DVT. Surgery input appreciated, no indication of any procedure patient was treated with iv vancomycin throughout his hospital stay . Initially he was also started on unasyn which was stopped by ID. Pt has been spiking fever until 7/14 on antibiotics, ID consult was obtained. Recommended c diff pcr which came back negative, Hiv antibody was negative. cxr negative for pneumonia, urine cultures negative. Blood culture negative. Last VANCO trough was 13. Patient had now sloughing off of his necrotic skin - we shall continue intravenous antibiotics for now and local wound care Patient was started on hydrotherapy on February 17, 2012 for more aggressive wound debridement.   Had PICC line placed.  Acute renal failure:On admission the creatinine was elevated at 1.96. Patient was taken off ARB and HCTZ and started on iv fluids. ARF resolved by 7/14 with iv fluids. US kidney did not show hydronephrosis. ARB resumed  Hypokalemia and hyponatremia: from HCTZ. Patient was started on gentle hydration and had K repleted as needed. Magnesium level was checked and it was normal. Safe to resume HCTZ at the time of the discharge  Acute on chronic diastolic  congestive heart failure - after IV fluid resuscitation the patient developed bilateral pedal edema and shortness of breath: Echo showed good left ventricular systolic function, but moderate diastolic dysfunction. Patient received intermittent iv lasix for diuresis from July 16 until July 18. Started on oral furosemide on July 19, then stopped. At the discharge we resumed HCTZ.   Has first appt at wound care center tomorrow. Home Health  following. Tonight is last dose of vancomycin... ? Home health wondering if this is the last dose.  He states that the leg is looking better but still "rough looking" No fevers at home except at night occ 99.8. No N/V.     Review of Systems  Constitutional: Negative for fever, fatigue and unexpected weight change.  HENT: Negative for ear pain, congestion, sore throat, rhinorrhea, trouble swallowing and postnasal drip.   Eyes: Negative for pain.  Respiratory: Negative for cough, shortness of breath and wheezing.   Cardiovascular: Negative for chest pain, palpitations and leg swelling.  Gastrointestinal: Negative for nausea, abdominal pain, diarrhea, constipation and blood in stool.  Genitourinary: Negative for dysuria, urgency, hematuria, discharge, penile swelling, scrotal swelling, difficulty urinating, penile pain and testicular pain.  Skin: Negative for rash.  Neurological: Negative for syncope, weakness, light-headedness, numbness and headaches.  Psychiatric/Behavioral: Negative for behavioral problems and dysphoric mood. The patient is not nervous/anxious.        Objective:   Physical Exam  Constitutional: He is oriented to person, place, and time. Vital signs are normal. He appears well-developed and well-nourished.  HENT:  Head: Normocephalic.  Right  Ear: Hearing normal.  Left Ear: Hearing normal.  Nose: Nose normal.  Mouth/Throat: Oropharynx is clear and moist and mucous membranes are normal.  Neck: Trachea normal. Carotid bruit is not  present. No mass and no thyromegaly present.  Cardiovascular: Normal rate, regular rhythm and normal pulses.  Exam reveals no gallop, no distant heart sounds and no friction rub.   No murmur heard.      No peripheral edema  Pulmonary/Chest: Effort normal and breath sounds normal. No respiratory distress.  Abdominal: Soft. Bowel sounds are normal.  Neurological: He is alert and oriented to person, place, and time. No cranial nerve deficit.  Skin: Skin is warm, dry and intact. Rash noted.       dry flaky erythematous skin up to knee, no pus, no blisters, no discharge.  Small lesion right temple, central scab and rolled birders.. Worrisome for  basal cell carcinoma  Psychiatric: He has a normal mood and affect. His speech is normal and behavior is normal. Thought content normal.          Assessment & Plan:

## 2012-02-28 NOTE — Patient Instructions (Addendum)
Stop at front desk for referral to Derm.  Keep appt for wound care center. Get their opinion for any need of continuation of vancomycin.  Work on low fat low salt diet, work on exercise and weight loss.  Make follow up appt  With ME on 8/16 for re-evaluation of return to work.

## 2012-02-28 NOTE — Telephone Encounter (Signed)
Please let them know . I discussed with Blake Garcia.. We are going to let wound care center make that decision.. He has appt tomorrow.

## 2012-02-28 NOTE — Assessment & Plan Note (Signed)
Euvolemic on HCTZ. Work on [employer], exercise and continued weight loss. Keep HTN well controlled.

## 2012-02-28 NOTE — Assessment & Plan Note (Signed)
Resolved.. Creatinine from [facility] today. 0.95.

## 2012-02-28 NOTE — Assessment & Plan Note (Signed)
Refer to derm  ?

## 2012-02-28 NOTE — Telephone Encounter (Signed)
Pharmacist at [facility] called to ask if you want to continue pt's vancomycin.  He had originally been prescribed a 7 day course.  Pt has follow up appt this afternoon.

## 2012-02-29 ENCOUNTER — Encounter (HOSPITAL_BASED_OUTPATIENT_CLINIC_OR_DEPARTMENT_OTHER): Payer: BC Managed Care – PPO | Attending: General Surgery

## 2012-02-29 DIAGNOSIS — L03119 Cellulitis of unspecified part of limb: Secondary | ICD-10-CM | POA: Insufficient documentation

## 2012-02-29 DIAGNOSIS — L02419 Cutaneous abscess of limb, unspecified: Secondary | ICD-10-CM | POA: Insufficient documentation

## 2012-02-29 DIAGNOSIS — I872 Venous insufficiency (chronic) (peripheral): Secondary | ICD-10-CM | POA: Insufficient documentation

## 2012-02-29 DIAGNOSIS — I5032 Chronic diastolic (congestive) heart failure: Secondary | ICD-10-CM | POA: Insufficient documentation

## 2012-02-29 DIAGNOSIS — E785 Hyperlipidemia, unspecified: Secondary | ICD-10-CM | POA: Insufficient documentation

## 2012-02-29 DIAGNOSIS — I10 Essential (primary) hypertension: Secondary | ICD-10-CM | POA: Insufficient documentation

## 2012-02-29 NOTE — Telephone Encounter (Signed)
Advised [facility]ist at [facility], gave her phone number of wound center- [phone].

## 2012-03-01 ENCOUNTER — Encounter (HOSPITAL_BASED_OUTPATIENT_CLINIC_OR_DEPARTMENT_OTHER): Payer: BC Managed Care – PPO | Attending: General Surgery

## 2012-03-01 DIAGNOSIS — L02419 Cutaneous abscess of limb, unspecified: Secondary | ICD-10-CM

## 2012-03-01 DIAGNOSIS — I5032 Chronic diastolic (congestive) heart failure: Secondary | ICD-10-CM | POA: Insufficient documentation

## 2012-03-01 DIAGNOSIS — I872 Venous insufficiency (chronic) (peripheral): Secondary | ICD-10-CM | POA: Insufficient documentation

## 2012-03-01 DIAGNOSIS — L03119 Cellulitis of unspecified part of limb: Secondary | ICD-10-CM

## 2012-03-01 DIAGNOSIS — I10 Essential (primary) hypertension: Secondary | ICD-10-CM | POA: Insufficient documentation

## 2012-03-01 DIAGNOSIS — G4733 Obstructive sleep apnea (adult) (pediatric): Secondary | ICD-10-CM

## 2012-03-01 DIAGNOSIS — E785 Hyperlipidemia, unspecified: Secondary | ICD-10-CM | POA: Insufficient documentation

## 2012-03-01 DIAGNOSIS — I509 Heart failure, unspecified: Secondary | ICD-10-CM

## 2012-03-04 ENCOUNTER — Other Ambulatory Visit: Payer: Self-pay | Admitting: Family Medicine

## 2012-03-07 ENCOUNTER — Encounter (HOSPITAL_BASED_OUTPATIENT_CLINIC_OR_DEPARTMENT_OTHER): Payer: BC Managed Care – PPO | Attending: General Surgery

## 2012-03-07 DIAGNOSIS — I872 Venous insufficiency (chronic) (peripheral): Secondary | ICD-10-CM | POA: Insufficient documentation

## 2012-03-07 DIAGNOSIS — L02419 Cutaneous abscess of limb, unspecified: Secondary | ICD-10-CM | POA: Insufficient documentation

## 2012-03-07 DIAGNOSIS — L97809 Non-pressure chronic ulcer of other part of unspecified lower leg with unspecified severity: Secondary | ICD-10-CM | POA: Insufficient documentation

## 2012-03-13 ENCOUNTER — Ambulatory Visit: Payer: BC Managed Care – PPO | Admitting: Family Medicine

## 2012-03-13 ENCOUNTER — Encounter: Payer: Self-pay | Admitting: Family Medicine

## 2012-03-13 ENCOUNTER — Ambulatory Visit (INDEPENDENT_AMBULATORY_CARE_PROVIDER_SITE_OTHER): Payer: BC Managed Care – PPO | Admitting: Family Medicine

## 2012-03-13 VITALS — BP 118/70 | Temp 98.4°F | Wt >= 6400 oz

## 2012-03-13 DIAGNOSIS — F329 Major depressive disorder, single episode, unspecified: Secondary | ICD-10-CM

## 2012-03-13 DIAGNOSIS — L03115 Cellulitis of right lower limb: Secondary | ICD-10-CM

## 2012-03-13 DIAGNOSIS — I5030 Unspecified diastolic (congestive) heart failure: Secondary | ICD-10-CM

## 2012-03-13 DIAGNOSIS — L03119 Cellulitis of unspecified part of limb: Secondary | ICD-10-CM

## 2012-03-13 DIAGNOSIS — L02419 Cutaneous abscess of limb, unspecified: Secondary | ICD-10-CM

## 2012-03-13 NOTE — Progress Notes (Signed)
  Subjective:    Patient ID: Blake Garcia, male    DOB: Dec 24, 1955, 56 y.o.   MRN: [id number]  HPI  56 year old male returns for 2 week follow up on right leg cellulitis that failed outpt treatment and required hospitalization. He has completed antibiotics through his PICC line and is now gong weekly to the wound care center. He reports wound care center feels area is improving, likely will be discharged tommorow.  Diastolic heart failure...euvolemic on diovan HCTZ. BP well controlled.  Peripheral swelling improved as well.   No fevers.  He does continue to feel fatigued.    Depression, stable on effexor. This works well for him except mother fell recently .[name] Will be coming home to his house soon. She has some dementia and is high maintanence. He is wondering if he can increase to 150 mg daily prophylactically.      Review of Systems  Constitutional: Positive for fatigue. Negative for fever.  HENT: Negative for ear pain.   Eyes: Negative for pain.  Respiratory: Negative for shortness of breath.   Cardiovascular: Negative for chest pain.  Gastrointestinal: Negative for abdominal pain.       Objective:   Physical Exam  Constitutional: He is oriented to person, place, and time. Vital signs are normal. He appears well-developed and well-nourished.  HENT:  Head: Normocephalic.  Right Ear: Hearing normal.  Left Ear: Hearing normal.  Nose: Nose normal.  Mouth/Throat: Oropharynx is clear and moist and mucous membranes are normal.  Neck: Trachea normal. Carotid bruit is not present. No mass and no thyromegaly present.  Cardiovascular: Normal rate, regular rhythm and normal pulses.  Exam reveals no gallop, no distant heart sounds and no friction rub.   No murmur heard.      No peripheral edema  Pulmonary/Chest: Effort normal and breath sounds normal. No respiratory distress.  Abdominal: Soft. Bowel sounds are normal.  Neurological: He is alert and oriented to person, place, and  time. No cranial nerve deficit.  Skin: Skin is warm, dry and intact. Rash noted.       dry flaky erythematous skin up to knee, no pus, no blisters, no discharge. In bandage.  Small lesion right temple, central scab and rolled birders.. Worrisome for  basal cell carcinoma  Psychiatric: He has a normal mood and affect. His speech is normal and behavior is normal. Thought content normal.          Assessment & Plan:

## 2012-03-13 NOTE — Assessment & Plan Note (Signed)
Can increase effexor to 150 mg daily given expected increase in stress. Work on regular exercise, stress reduction.

## 2012-03-13 NOTE — Assessment & Plan Note (Signed)
euvolemic 

## 2012-03-13 NOTE — Patient Instructions (Addendum)
Okay to increase to effexor 75 Mg TWO tabs po daily if needed. Call if mood not well controlled.  Follow up as needed.  Okay to return to work as planned on 8/19.

## 2012-03-13 NOTE — Assessment & Plan Note (Signed)
Resolving per wound care center.

## 2012-03-15 ENCOUNTER — Other Ambulatory Visit: Payer: Self-pay | Admitting: Family Medicine

## 2012-03-27 DIAGNOSIS — Z0279 Encounter for issue of other medical certificate: Secondary | ICD-10-CM

## 2012-03-29 ENCOUNTER — Telehealth: Payer: Self-pay | Admitting: Family Medicine

## 2012-03-29 NOTE — Telephone Encounter (Signed)
Patient had called regarding the charge for filling out his short term disability forms. He questioned the $50 charge. I spoke with Dr. Ermalene Searing and agreed to lower the charge to $20. I called Juanjose and he is aware to the decrease in the charge.

## 2012-04-04 ENCOUNTER — Encounter (HOSPITAL_BASED_OUTPATIENT_CLINIC_OR_DEPARTMENT_OTHER): Payer: BC Managed Care – PPO

## 2012-04-26 DIAGNOSIS — C4491 Basal cell carcinoma of skin, unspecified: Secondary | ICD-10-CM

## 2012-04-26 HISTORY — DX: Basal cell carcinoma of skin, unspecified: C44.91

## 2012-05-04 ENCOUNTER — Encounter: Payer: Self-pay | Admitting: *Deleted

## 2012-05-18 ENCOUNTER — Other Ambulatory Visit: Payer: Self-pay | Admitting: *Deleted

## 2012-05-18 MED ORDER — VENLAFAXINE HCL ER 75 MG PO CP24: 75.0000 mg | ORAL_CAPSULE | Freq: Every day | ORAL | Status: DC

## 2012-07-16 ENCOUNTER — Other Ambulatory Visit: Payer: Self-pay | Admitting: Family Medicine

## 2012-09-14 ENCOUNTER — Encounter: Payer: Self-pay | Admitting: Internal Medicine

## 2012-09-14 DIAGNOSIS — Z860100 Personal history of colon polyps, unspecified: Secondary | ICD-10-CM

## 2012-09-14 HISTORY — DX: Personal history of colon polyps, unspecified: Z86.0100

## 2012-09-14 HISTORY — DX: Personal history of colonic polyps: Z86.010

## 2012-09-17 ENCOUNTER — Encounter: Payer: Self-pay | Admitting: Internal Medicine

## 2012-10-21 ENCOUNTER — Telehealth: Payer: Self-pay | Admitting: Family Medicine

## 2012-10-21 DIAGNOSIS — E785 Hyperlipidemia, unspecified: Secondary | ICD-10-CM

## 2012-10-21 DIAGNOSIS — R7309 Other abnormal glucose: Secondary | ICD-10-CM

## 2012-10-21 DIAGNOSIS — R972 Elevated prostate specific antigen [PSA]: Secondary | ICD-10-CM

## 2012-10-21 NOTE — Telephone Encounter (Signed)
Message copied by Excell Seltzer on Sun Oct 21, 2012 11:25 PM ------      Message from: Baldomero Lamy      Created: Fri Oct 12, 2012 10:34 AM      Regarding: Cpx labs scheduled 10/22/12 (Mon)       Please order  future cpx labs for pt's upcoming lab appt.      Thanks      Tasha       ------

## 2012-10-22 ENCOUNTER — Other Ambulatory Visit (INDEPENDENT_AMBULATORY_CARE_PROVIDER_SITE_OTHER): Payer: BC Managed Care – PPO

## 2012-10-22 DIAGNOSIS — R7309 Other abnormal glucose: Secondary | ICD-10-CM

## 2012-10-22 DIAGNOSIS — E785 Hyperlipidemia, unspecified: Secondary | ICD-10-CM

## 2012-10-22 DIAGNOSIS — R972 Elevated prostate specific antigen [PSA]: Secondary | ICD-10-CM

## 2012-10-22 LAB — LIPID PANEL
Cholesterol: 151 mg/dL (ref 0–200)
LDL Cholesterol: 88 mg/dL (ref 0–99)
Total CHOL/HDL Ratio: 3

## 2012-10-22 LAB — COMPREHENSIVE METABOLIC PANEL
AST: 17 U/L (ref 0–37)
Albumin: 3.6 g/dL (ref 3.5–5.2)
BUN: 19 mg/dL (ref 6–23)
CO2: 34 mEq/L — ABNORMAL HIGH (ref 19–32)
Calcium: 9 mg/dL (ref 8.4–10.5)
Chloride: 97 mEq/L (ref 96–112)
Creatinine, Ser: 1.2 mg/dL (ref 0.4–1.5)
GFR: 64.69 mL/min (ref >60.00)
Glucose, Bld: 111 mg/dL — ABNORMAL HIGH (ref 70–99)
Potassium: 3.7 mEq/L (ref 3.5–5.1)

## 2012-10-25 ENCOUNTER — Encounter: Payer: BC Managed Care – PPO | Admitting: Family Medicine

## 2012-11-01 ENCOUNTER — Encounter: Payer: Self-pay | Admitting: Family Medicine

## 2012-11-01 ENCOUNTER — Ambulatory Visit (INDEPENDENT_AMBULATORY_CARE_PROVIDER_SITE_OTHER): Payer: BC Managed Care – PPO | Admitting: Family Medicine

## 2012-11-01 VITALS — BP 130/90 | HR 100 | Temp 98.7°F | Wt >= 6400 oz

## 2012-11-01 DIAGNOSIS — R972 Elevated prostate specific antigen [PSA]: Secondary | ICD-10-CM

## 2012-11-01 DIAGNOSIS — R7309 Other abnormal glucose: Secondary | ICD-10-CM

## 2012-11-01 DIAGNOSIS — R0609 Other forms of dyspnea: Secondary | ICD-10-CM

## 2012-11-01 DIAGNOSIS — Z0000 Encounter for general adult medical examination without abnormal findings: Secondary | ICD-10-CM

## 2012-11-01 DIAGNOSIS — G4733 Obstructive sleep apnea (adult) (pediatric): Secondary | ICD-10-CM

## 2012-11-01 DIAGNOSIS — I5030 Unspecified diastolic (congestive) heart failure: Secondary | ICD-10-CM

## 2012-11-01 DIAGNOSIS — I10 Essential (primary) hypertension: Secondary | ICD-10-CM

## 2012-11-01 DIAGNOSIS — E785 Hyperlipidemia, unspecified: Secondary | ICD-10-CM

## 2012-11-01 MED ORDER — AMLODIPINE BESYLATE 10 MG PO TABS: 10.0000 mg | ORAL_TABLET | Freq: Every day | ORAL | Status: DC

## 2012-11-01 MED ORDER — SIMVASTATIN 20 MG PO TABS: 20.0000 mg | ORAL_TABLET | Freq: Every day | ORAL | Status: DC

## 2012-11-01 NOTE — Assessment & Plan Note (Signed)
Poor control with weight gain. Increase amlodipine. Follow Bps at home.

## 2012-11-01 NOTE — Assessment & Plan Note (Signed)
Needs better HTN control.

## 2012-11-01 NOTE — Assessment & Plan Note (Signed)
With weight gain he likely needs CPAP re-eval. He is experiencing daytime sleepiness. Refer back to Dr. Shelle Iron.

## 2012-11-01 NOTE — Assessment & Plan Note (Signed)
Likely deconditioning and obesity hypoventilation syndrome.

## 2012-11-01 NOTE — Patient Instructions (Addendum)
Call to schedule colonoscopy ASAP. Call to schedule follow up for sleep apnea, execessive daytime sleepiness and DOE with Dr. Shelle Iron.  let me know if you are interested in nutrition referral.  Look into [employer].. Water exercise program.  Look into bariatric surgery seminar at [facility]. Increase amlodipine to 10 mg daily. Follow BP at home, goal <140/90.  Decrease simvastatin to 20 mg daily.  Follow up in 3 months 30 Min, weight, BP and cholesterol with fasting labs prior.

## 2012-11-01 NOTE — Assessment & Plan Note (Signed)
Very well controlled.. Given on amlodipine, and recent increase... Will decrease down to simvastatin 20 mg to avoid risk of rhabdo . Recheck in 3 months..if not at goal consider changing to atorvastatin.

## 2012-11-01 NOTE — Assessment & Plan Note (Signed)
Encouraged exercise, weight loss, healthy eating habits. ? ?

## 2012-11-01 NOTE — Assessment & Plan Note (Addendum)
Resolved. Will see URO for prostate exam.

## 2012-11-01 NOTE — Progress Notes (Signed)
Subjective:    Patient ID: Blake Garcia, male    DOB: [birth year], 57 y.o.   MRN: [id number]  HPI The patient is here for annual wellness exam and preventative care.   He also ahs the following chronic health issues.  He is having difficulty losing weight.  He cannot exercise due to weight and breathing issues. He is considering bariatric surgery.he is loking into weight loss program at work. Trying to eat more healthy. Limiting portion size. Wt Readings from Last 3 Encounters:  11/01/12 444 lb 4 oz (201.51 kg)  03/13/12 418 lb (189.604 kg)  02/28/12 414 lb (187.789 kg)   Sleeping excessively during the day. Has CPAP for sleep apnea. Set up by Dr. Shelle Iron but has not been seeing him. DOE.[name] Notes with walking up stairs, none at rest. No cough, no wheeze.   Mother ill.. Stressed out about this and work.  Seeing derm for possible chronic nonhealing infection in right ankle where past cellulitis was.  Biopsy done.  Elevated Cholesterol: Well controlled on simvastatin 40 mg daily Lab Results  Component Value Date   CHOL 151 10/22/2012   HDL 44.90 10/22/2012   LDLCALC 88 10/22/2012   LDLDIRECT 153.9 09/10/2007   TRIG 89.0 10/22/2012   CHOLHDL 3 10/22/2012  Using medications without problems:None Muscle aches: None Diet compliance: Moderate. Exercise: None, limited by breathing. Other complaints:  Hypertension:   Borderline control on amlodipine, diovan HCT.  Using medication without problems or lightheadedness: NOne Chest pain with exertion: Edema: Short of breath: See above Average home BPs: 135/92-140/88 Other issues:  Prediabetes: not improved.   Depression: Moderate control on effexor. No SI. Under a lot of stress.   Review of Systems  Constitutional: Negative for fever, fatigue and unexpected weight change.  HENT: Negative for ear pain, congestion, sore throat, rhinorrhea, trouble swallowing and postnasal drip.   Eyes: Negative for pain.  Respiratory: Positive for  shortness of breath. Negative for cough and wheezing.   Cardiovascular: Negative for chest pain, palpitations and leg swelling.  Gastrointestinal: Negative for nausea, abdominal pain, diarrhea, constipation and blood in stool.  Genitourinary: Negative for dysuria, urgency, hematuria, discharge, penile swelling, scrotal swelling, difficulty urinating, penile pain and testicular pain.  Skin: Negative for rash.  Neurological: Negative for syncope, weakness, light-headedness, numbness and headaches.  Psychiatric/Behavioral: Negative for behavioral problems and dysphoric mood. The patient is not nervous/anxious.        Objective:   Physical Exam  Constitutional: He appears well-developed and well-nourished.  Non-toxic appearance. He does not appear ill. No distress.  Morbid obesity  HENT:  Head: Normocephalic and atraumatic.  Right Ear: Hearing, tympanic membrane, external ear and ear canal normal.  Left Ear: Hearing, tympanic membrane, external ear and ear canal normal.  Nose: Nose normal.  Mouth/Throat: Uvula is midline, oropharynx is clear and moist and mucous membranes are normal.  Small oropharynx  Eyes: Conjunctivae, EOM and lids are normal. Pupils are equal, round, and reactive to light. No foreign bodies found.  Neck: Trachea normal, normal range of motion and phonation normal. Neck supple. Carotid bruit is not present. No mass and no thyromegaly present.  Short neck  Cardiovascular: Normal rate, regular rhythm, S1 normal, S2 normal, intact distal pulses and normal pulses.  Exam reveals no gallop.   No murmur heard. Pulmonary/Chest: Breath sounds normal. He has no wheezes. He has no rhonchi. He has no rales.  Abdominal: Soft. Normal appearance and bowel sounds are normal. There is no hepatosplenomegaly. There is no tenderness.  There is no rebound, no guarding and no CVA tenderness. No hernia.  Genitourinary: Prostate is not tender.  Lymphadenopathy:    He has no cervical adenopathy.   Neurological: He is alert. He has normal strength and normal reflexes. No cranial nerve deficit or sensory deficit. Gait normal.  Skin: Skin is warm, dry and intact. No rash noted.  Psychiatric: He has a normal mood and affect. His speech is normal and behavior is normal. Judgment normal.         Prostate exam through [facility]. Assessment & Plan:  The patient's preventative maintenance and recommended screening tests for an annual wellness exam were reviewed in full today. Brought up to date unless services declined.  Counselled on the importance of diet, exercise, and its role in overall health and mortality. The patient's FH and SH was reviewed, including their home life, tobacco status, and drug and alcohol status.   Vaccines: uptodate with Td Colon: 2009 High grade adenoma Dr. Leone Payor, repeat in 5 years.. Overdue. PSA:  Stable, no further elevation. Saw [facility].[name] Plans to follow up. Lab Results  Component Value Date   PSA 3.42 10/22/2012   PSA 4.19* 02/24/2011   PSA 5.43* 02/21/2011  Nonsmoker

## 2012-11-27 ENCOUNTER — Other Ambulatory Visit: Payer: Self-pay | Admitting: Family Medicine

## 2012-12-02 IMAGING — US US RENAL
1 series · 14 of 23 positions shown · non-contrast
Comparison: None.

CLINICAL DATA: 56-year-old male with acute on chronic kidney
disease.

RENAL/URINARY TRACT ULTRASOUND COMPLETE

[Series 1: us renal · 0.47mm/px · 14 of 23 slices shown]
[im 1/23]
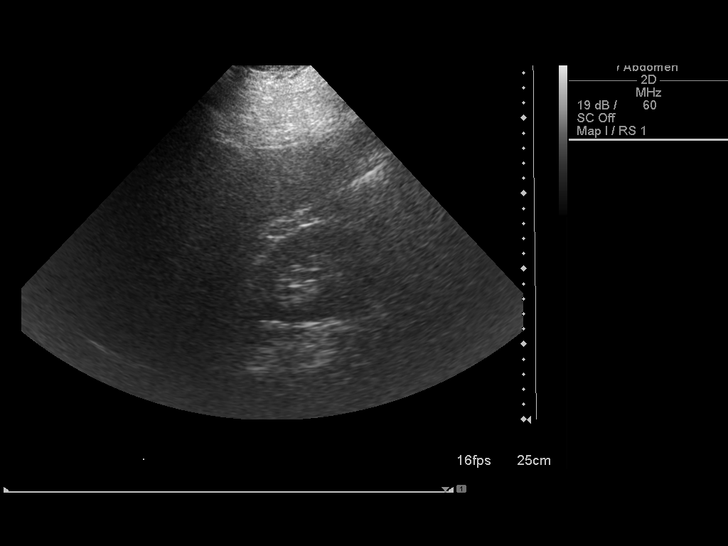
[im 3/23]
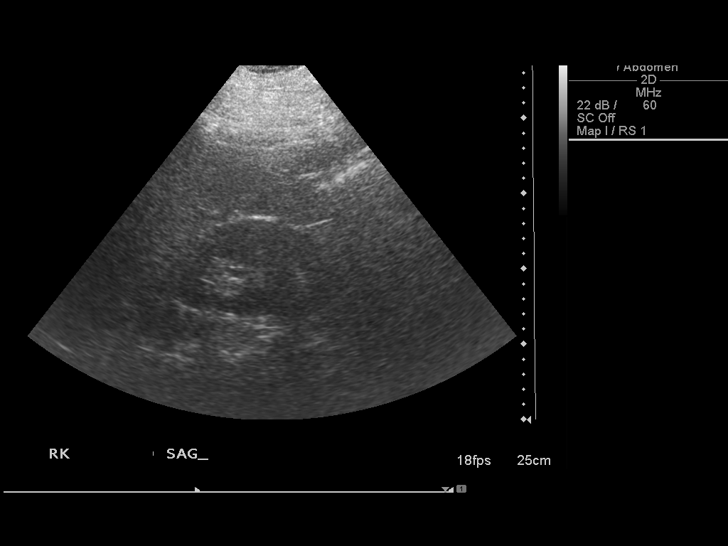
[im 5/23]
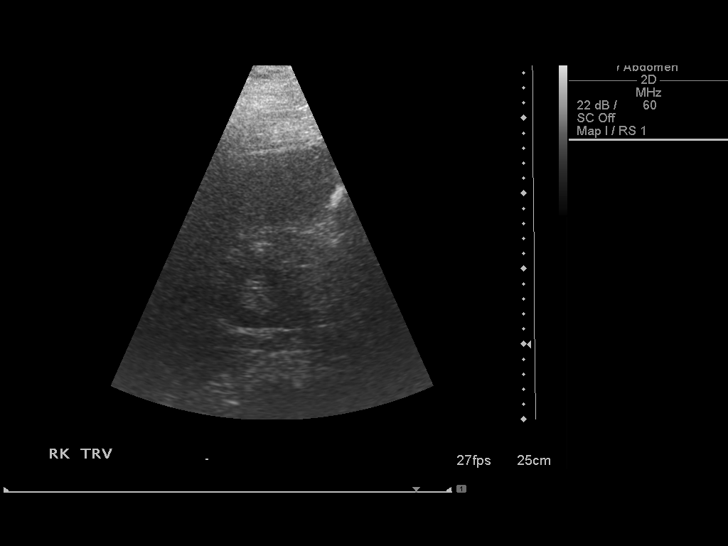
[im 6/23]
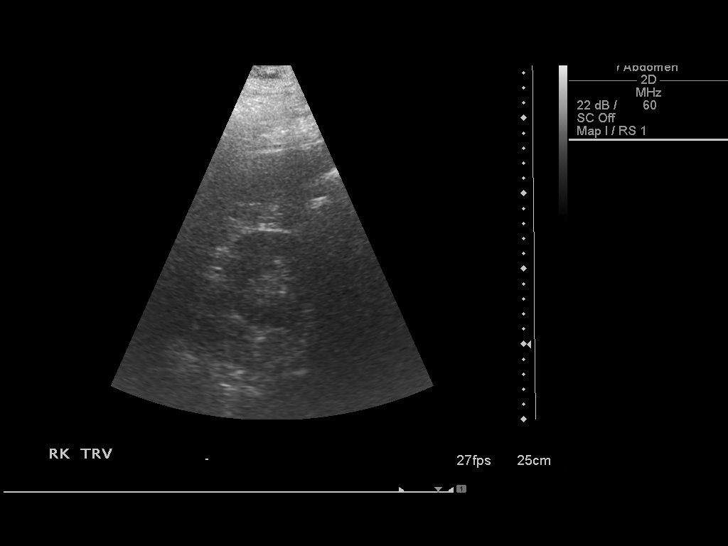
[im 8/23]
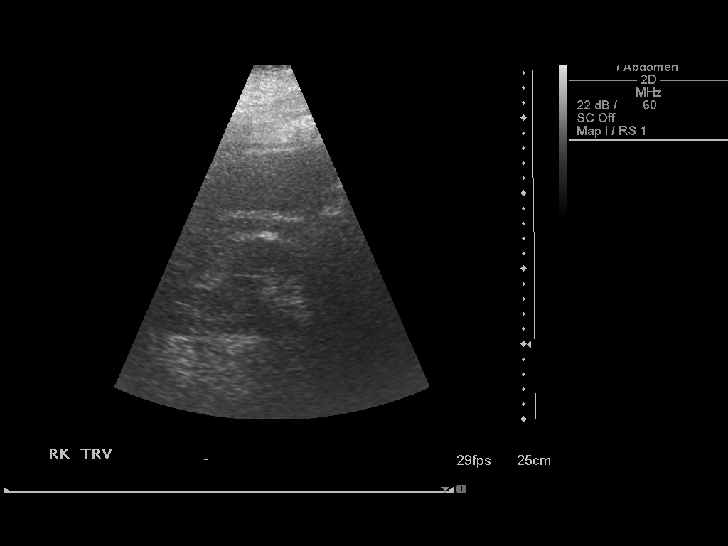
[im 10/23]
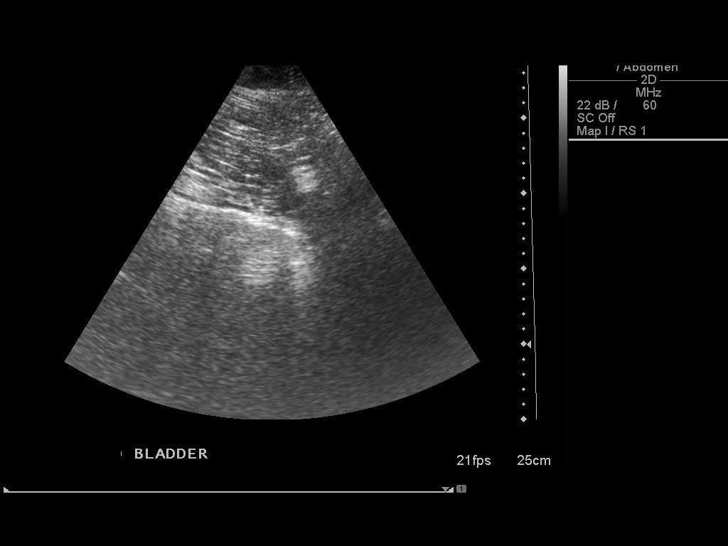
[im 11/23]
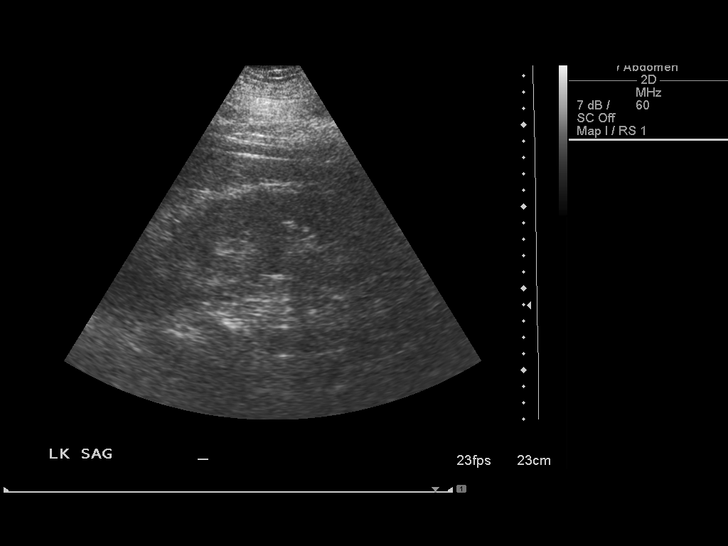
[im 13/23]
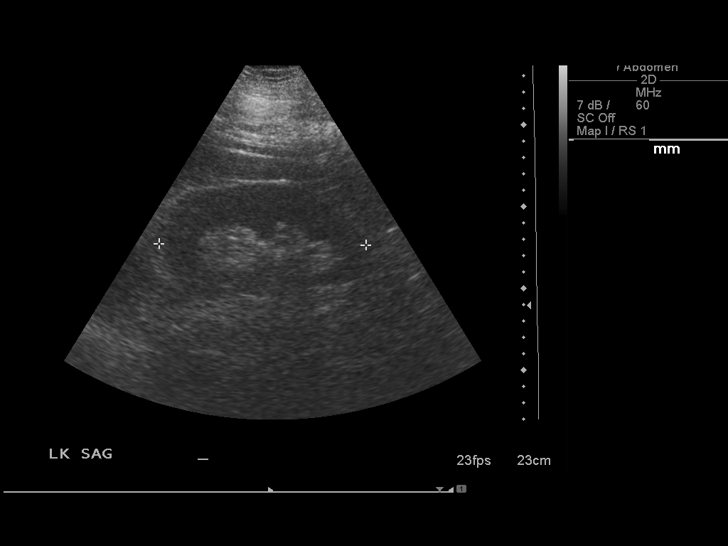
[im 14/23]
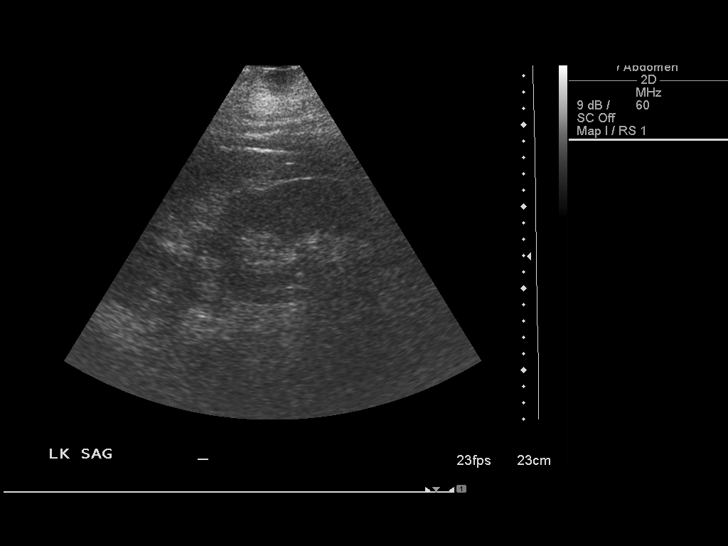
[im 16/23]
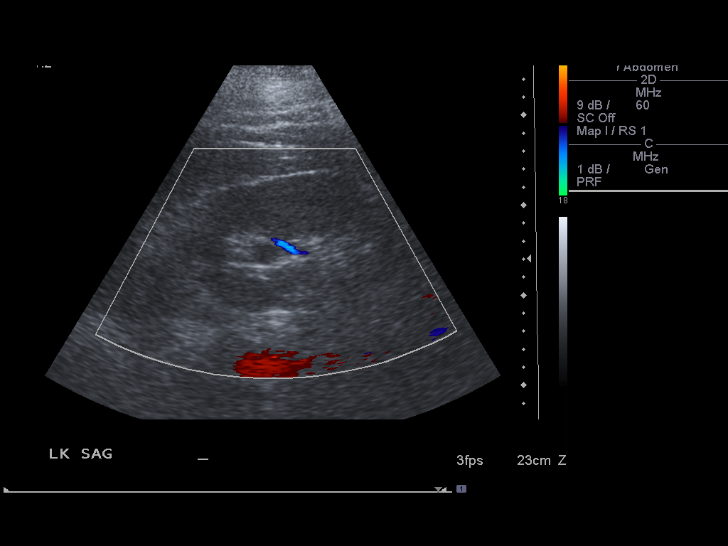
[im 18/23]
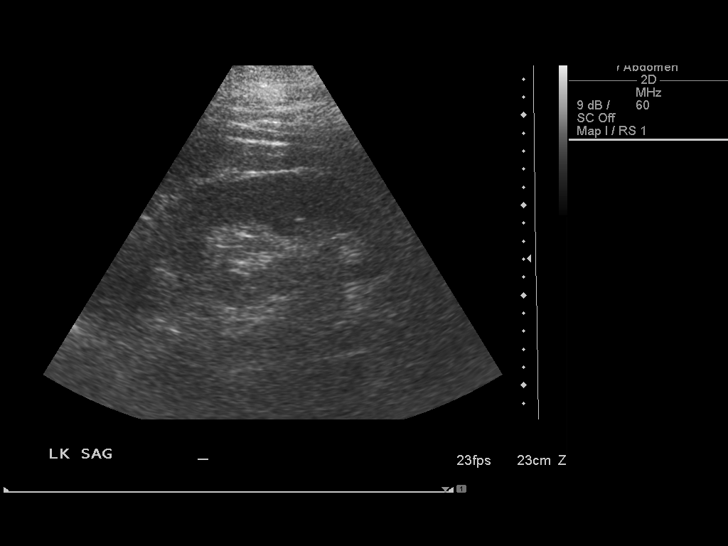
[im 19/23]
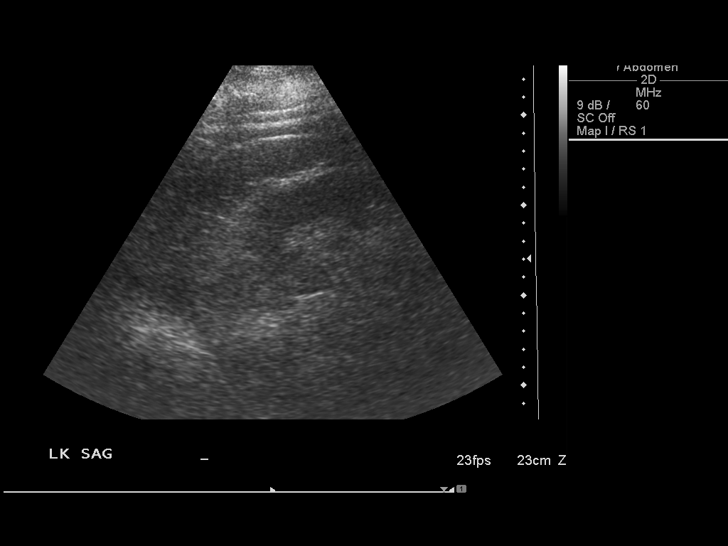
[im 21/23]
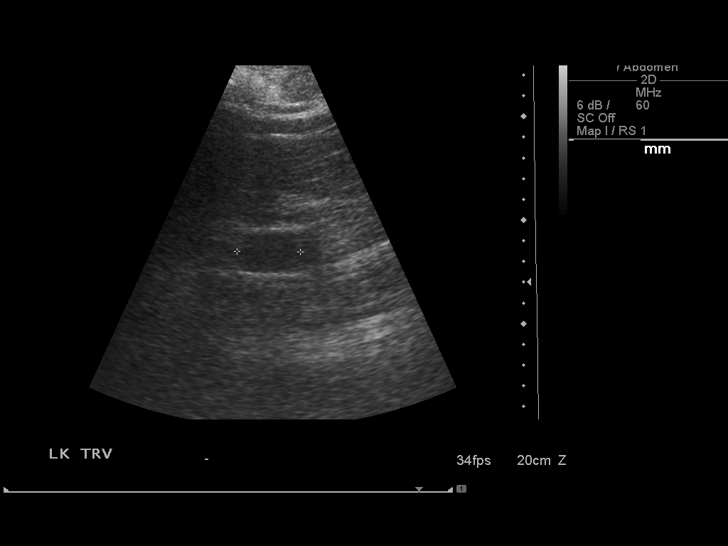
[im 23/23]
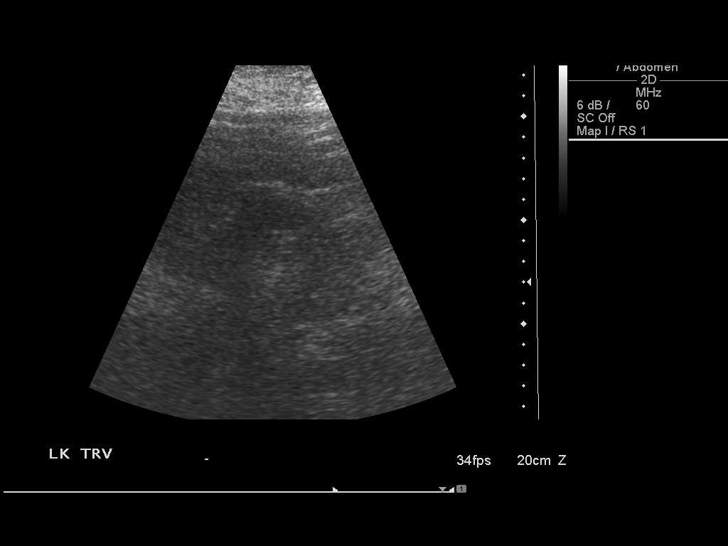

[14 of 23 positions shown; findings below may reference images not displayed]

FINDINGS: Right Kidney:  No hydronephrosis.  Renal length 11.1 cm.  Cortical
echotexture appears within normal limits.

Left Kidney:  No hydronephrosis.  Renal length approximately
cm.  Small exophytic hypoechoic area at the superior pole measuring
up to 31 mm diameter appears to represent a cyst without
vascularity on Doppler interrogation.

Bladder:  Not identified, presumably decompressed.
IMPRESSION: No hydronephrosis or acute renal findings.  Benign appearing left
upper pole cyst.

## 2013-01-24 ENCOUNTER — Other Ambulatory Visit (INDEPENDENT_AMBULATORY_CARE_PROVIDER_SITE_OTHER): Payer: BC Managed Care – PPO

## 2013-01-24 DIAGNOSIS — E785 Hyperlipidemia, unspecified: Secondary | ICD-10-CM

## 2013-01-24 LAB — LIPID PANEL
Cholesterol: 170 mg/dL (ref 0–200)
HDL: 58.2 mg/dL (ref >39.00)
Triglycerides: 85 mg/dL (ref 0.0–149.0)

## 2013-01-31 ENCOUNTER — Ambulatory Visit: Payer: BC Managed Care – PPO | Admitting: Family Medicine

## 2013-02-12 IMAGING — CR DG CHEST 2V
2 series · 2 of 2 positions shown · non-contrast
Comparison: None

CLINICAL DATA: , leukocytosis

CHEST - 2 VIEW

[w chest pa]
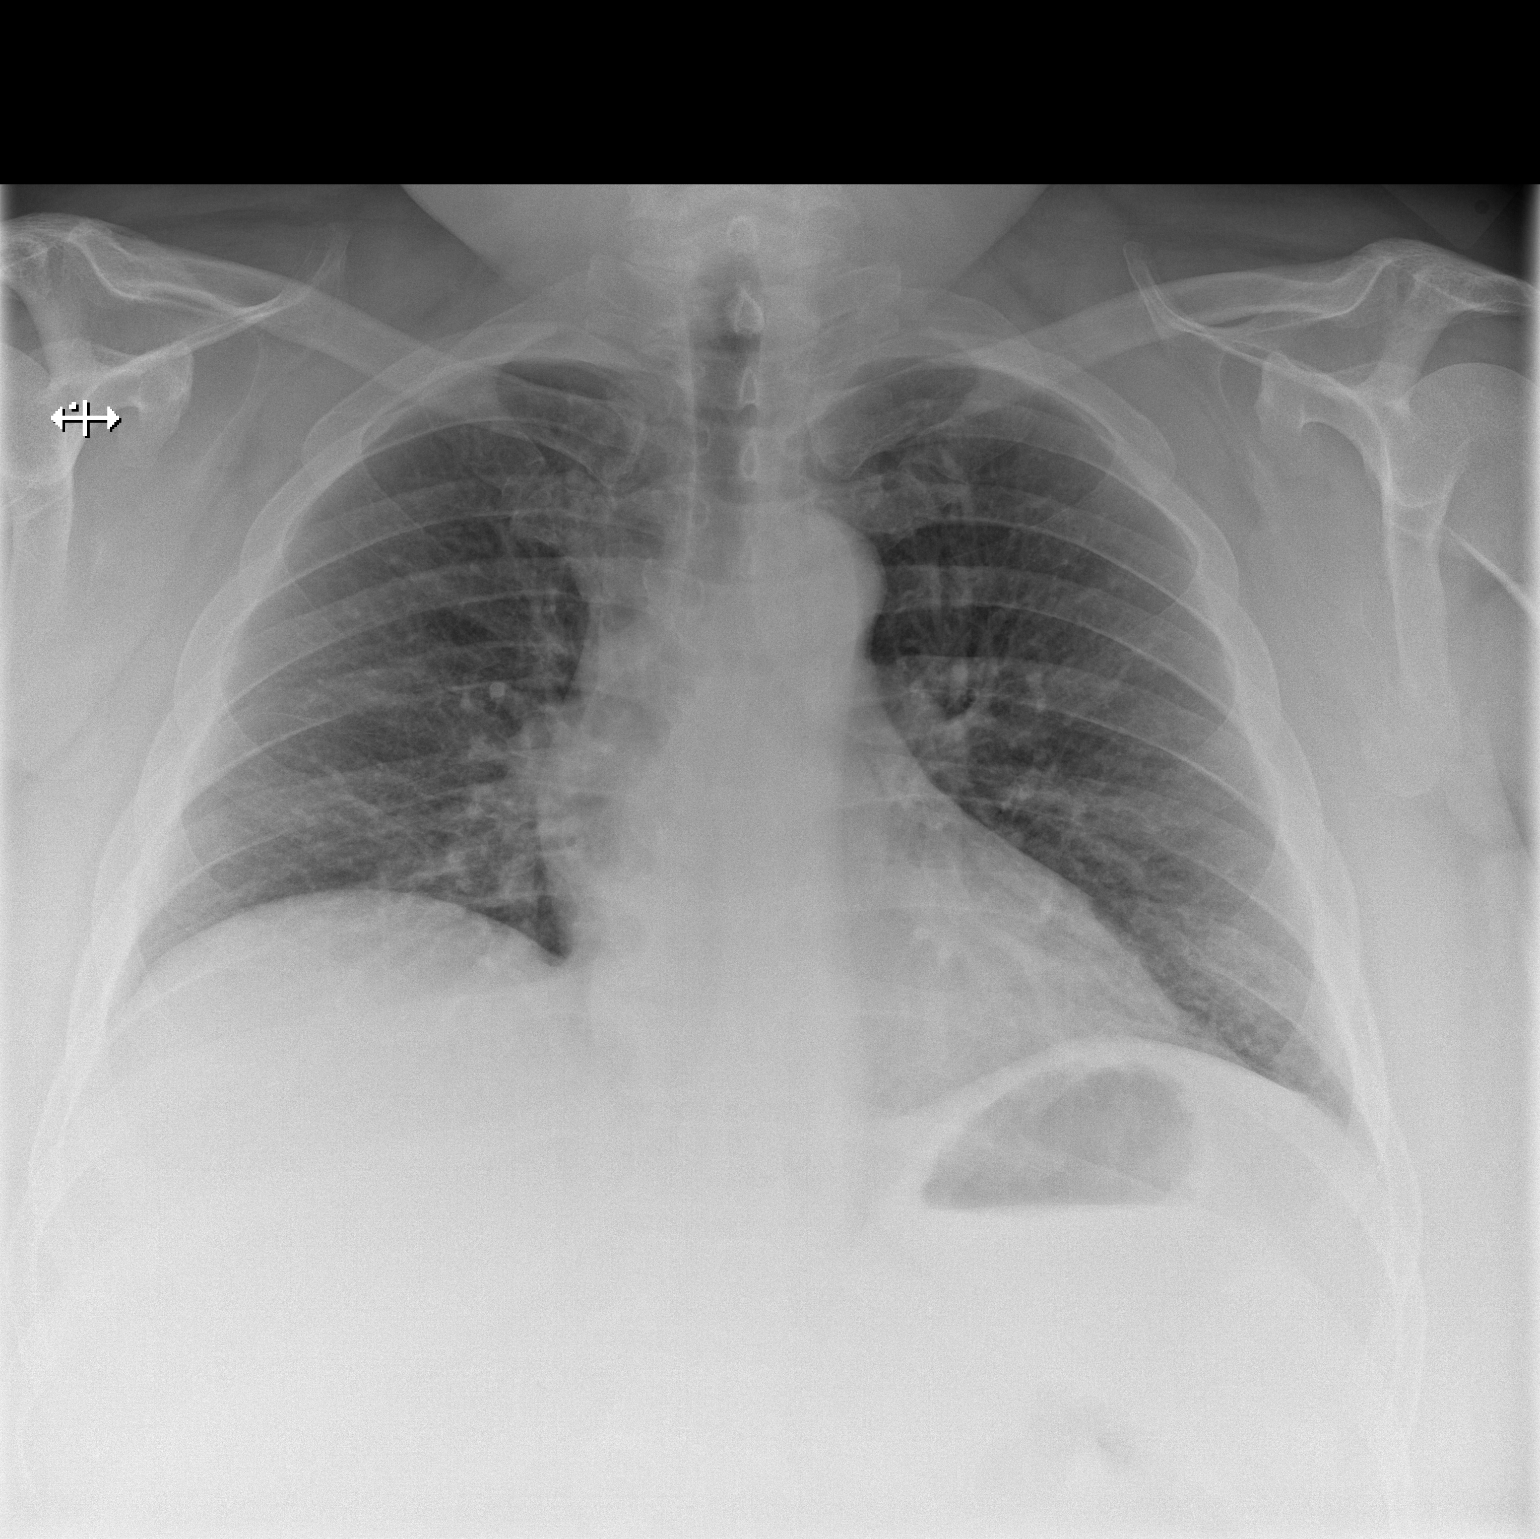

[w chest lat]
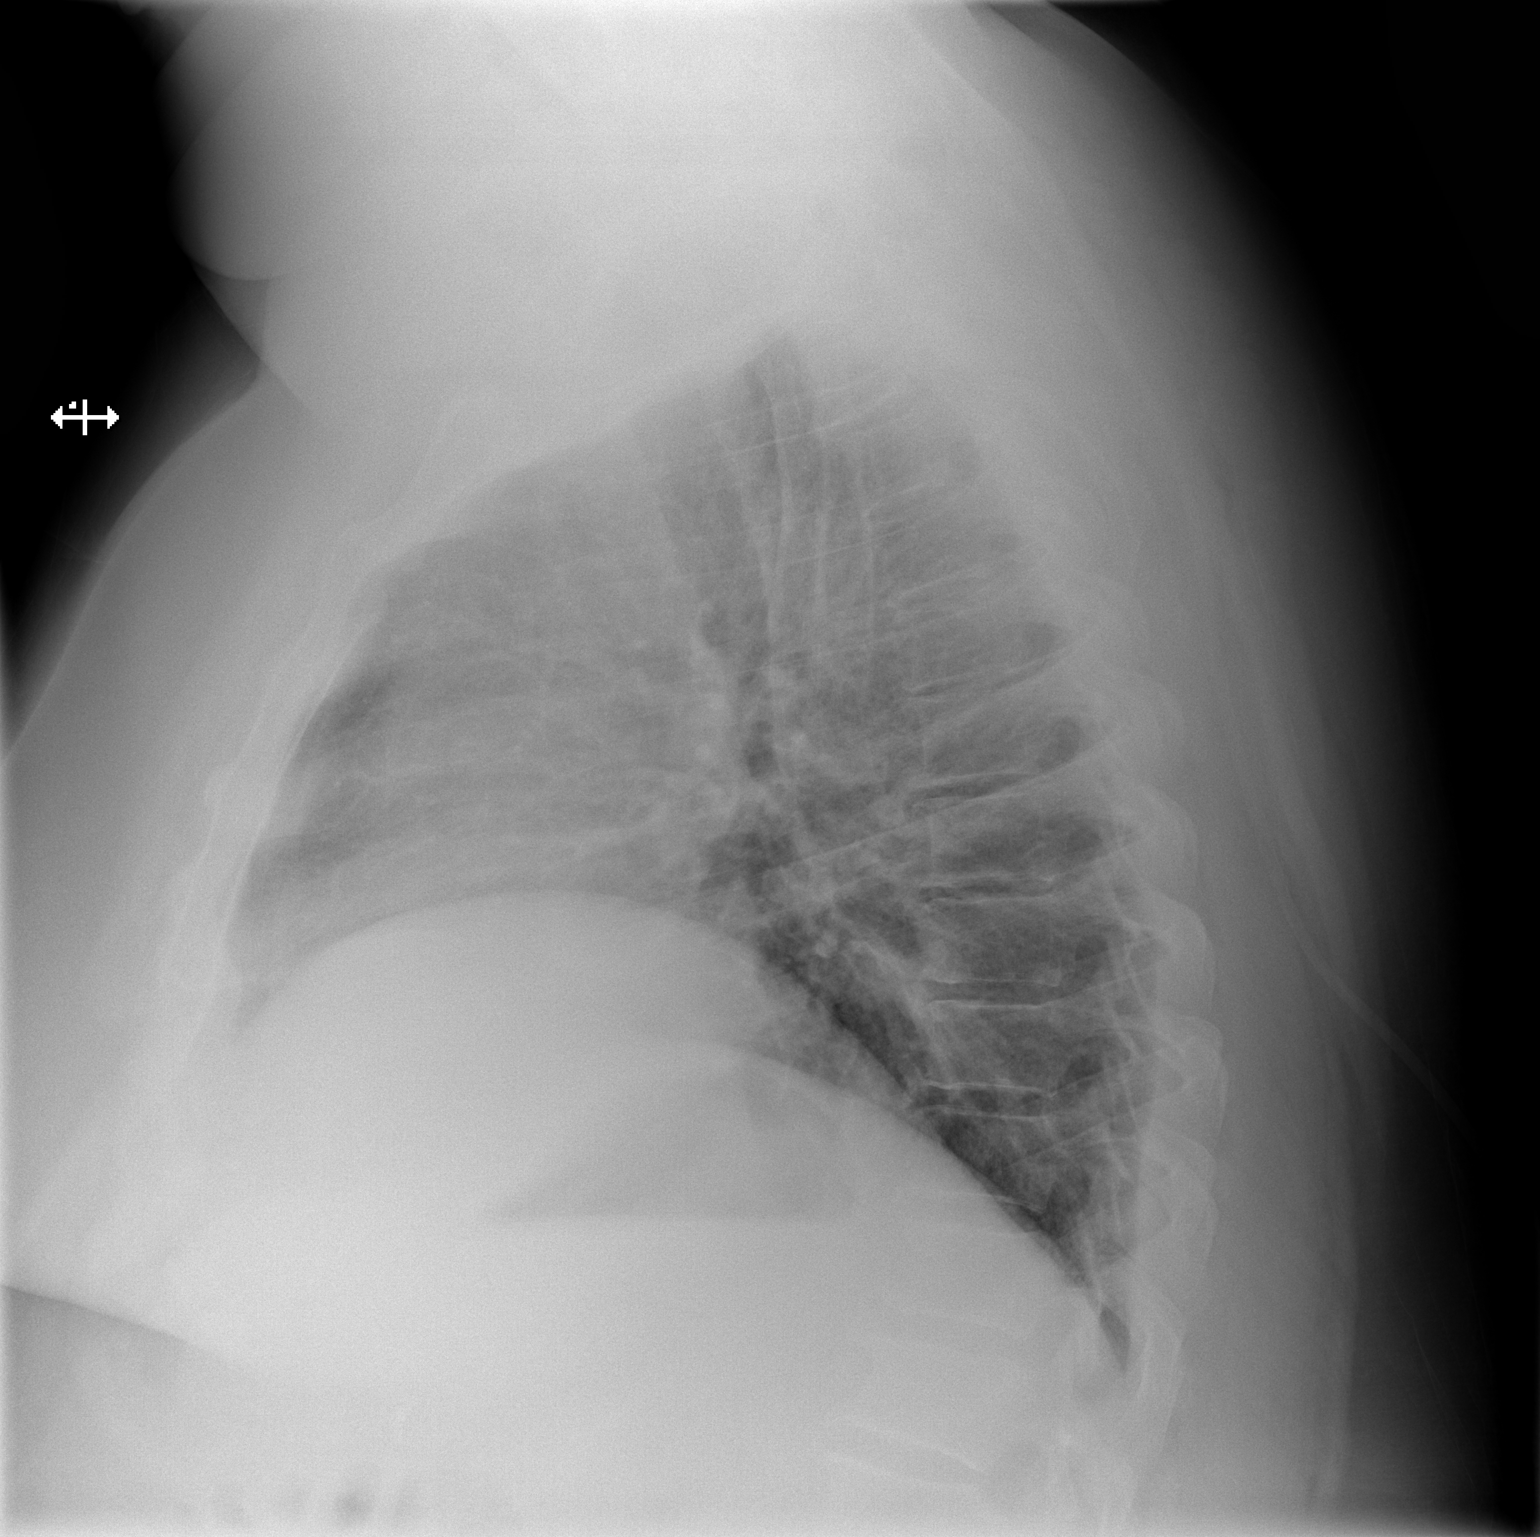

[2 of 2 positions shown; findings below may reference images not displayed]

FINDINGS: Upper-normal size of cardiac silhouette.
Mediastinal contours and pulmonary vascularity normal.
Low lung volumes with mild elevation right diaphragm and minimal
right basilar atelectasis.
Lungs otherwise clear.
No pleural effusion or pneumothorax.
Bones unremarkable.
IMPRESSION: Decreased lung volumes with mild right basilar atelectasis.

## 2013-03-18 ENCOUNTER — Other Ambulatory Visit: Payer: Self-pay | Admitting: Family Medicine

## 2013-03-23 ENCOUNTER — Other Ambulatory Visit: Payer: Self-pay | Admitting: Family Medicine

## 2013-04-16 ENCOUNTER — Encounter: Payer: Self-pay | Admitting: Family Medicine

## 2013-04-16 ENCOUNTER — Ambulatory Visit (INDEPENDENT_AMBULATORY_CARE_PROVIDER_SITE_OTHER): Payer: BC Managed Care – PPO | Admitting: Family Medicine

## 2013-04-16 VITALS — BP 122/78 | HR 99 | Temp 98.5°F | Ht 67.0 in | Wt >= 6400 oz

## 2013-04-16 DIAGNOSIS — L02419 Cutaneous abscess of limb, unspecified: Secondary | ICD-10-CM

## 2013-04-16 DIAGNOSIS — L03115 Cellulitis of right lower limb: Secondary | ICD-10-CM | POA: Insufficient documentation

## 2013-04-16 MED ORDER — DOXYCYCLINE HYCLATE 100 MG PO TABS: 100.0000 mg | ORAL_TABLET | Freq: Two times a day (BID) | ORAL | Status: DC

## 2013-04-16 NOTE — Patient Instructions (Addendum)
Start doxycycline. Continue applying antibiotic ointment to right ankle rash. Follow up with me on Friday. Call sooner if not tolerating antibiotics.. Go to ER if redness spreading or fever on antibiotic.

## 2013-04-16 NOTE — Progress Notes (Signed)
  Subjective:    Patient ID: Blake Garcia, male    DOB: April 01, 1956, 57 y.o.   MRN: [id number]  HPI  56 year old male with morbid obesity, HTn, diastolic HF, HTN, metabolic syndrome presents with  swelling in right leg. He has noted increase swelling in right calf in last 1-2 weeks, increased redness, no increase in warmth.  Rash at medial ankle he has had in past has returned.  He  has history of cellulitis in right leg 01/2012. Required hospitalization, IV antibiotics x 3 week, home healthy IV antibiotics for 1 week. Had unna boot.  Neg Xray for osteomyelitis in tib/fib.  Leg had returned to baseline. Had been wearing compression hose.  He is tachycardic but not feeling any palpitations or chest pain.  He is feeling some mild shortness of breath with weight issues. He is looking into bariatric surgery.   Review of Systems  Constitutional: Negative for fever and fatigue.  HENT: Negative for ear pain.   Eyes: Negative for pain.  Respiratory: Negative for cough and wheezing.   Cardiovascular: Positive for leg swelling. Negative for chest pain and palpitations.  Gastrointestinal: Negative for abdominal pain.       Objective:   Physical Exam  Constitutional: He is oriented to person, place, and time. Vital signs are normal. He appears well-developed and well-nourished.  Morbidly obese  HENT:  Head: Normocephalic.  Right Ear: Hearing normal.  Left Ear: Hearing normal.  Nose: Nose normal.  Mouth/Throat: Oropharynx is clear and moist and mucous membranes are normal.  Neck: Trachea normal. Carotid bruit is not present. No mass and no thyromegaly present.  Cardiovascular: Normal rate, regular rhythm and normal pulses.  Exam reveals no gallop, no distant heart sounds and no friction rub.   No murmur heard. No peripheral edema  Pulmonary/Chest: Effort normal and breath sounds normal. No respiratory distress.  Neurological: He is alert and oriented to person, place, and time.  Skin:  Skin is warm, dry and intact. No rash noted.  B legs with edema , right greater than left. 3 plus nonpitting edema.  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  right ankle: with yellow crusty plaque at medial ankle  Psychiatric: He has a normal mood and affect. His speech is normal and behavior is normal. Thought content normal.          Assessment & Plan:

## 2013-04-16 NOTE — Assessment & Plan Note (Signed)
Given history ocover for MRSa with doxycycline x 10 days.  Close follow up in 2 days.

## 2013-04-19 ENCOUNTER — Ambulatory Visit: Payer: BC Managed Care – PPO | Admitting: Family Medicine

## 2013-04-25 ENCOUNTER — Encounter: Payer: Self-pay | Admitting: Family Medicine

## 2013-04-25 ENCOUNTER — Ambulatory Visit (INDEPENDENT_AMBULATORY_CARE_PROVIDER_SITE_OTHER): Payer: BC Managed Care – PPO | Admitting: Family Medicine

## 2013-04-25 VITALS — BP 156/84 | HR 105 | Temp 98.6°F | Ht 67.0 in | Wt >= 6400 oz

## 2013-04-25 DIAGNOSIS — L02419 Cutaneous abscess of limb, unspecified: Secondary | ICD-10-CM

## 2013-04-25 DIAGNOSIS — L03115 Cellulitis of right lower limb: Secondary | ICD-10-CM

## 2013-04-25 NOTE — Progress Notes (Signed)
  Subjective:    Patient ID: Blake Garcia, male    DOB: 05-27-1956, 57 y.o.   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 He was started on antibiotics (DOXY X 10 DAYS TO COVER mrsa) on 9/19. He reports  Decreased swelling, no tenderness and decreased redness.  no fever. NO N/V. Rash on medial ankle is improving with topical antibiotic as well.   Review of Systems  Constitutional: Negative for fever and fatigue.  Respiratory: Negative for shortness of breath.   Cardiovascular: Negative for chest pain.       Objective:   Physical Exam  Constitutional: He is oriented to person, place, and time. He appears well-developed.  obese  Cardiovascular: Normal rate.   Pulmonary/Chest: Effort normal and breath sounds normal.  Neurological: He is alert and oriented to person, place, and time.  Skin:  Decrease in redness and decrease in swelling in right lower leg, rash at medial ankle dry flaky skin, no yellow colored crust, no increased warmth.          Assessment & Plan:

## 2013-04-25 NOTE — Assessment & Plan Note (Signed)
Improving on doxy. Complete course.

## 2013-04-25 NOTE — Patient Instructions (Addendum)
Complete antibiotics. 

## 2013-05-17 ENCOUNTER — Encounter: Payer: Self-pay | Admitting: Internal Medicine

## 2013-06-06 ENCOUNTER — Other Ambulatory Visit: Payer: Self-pay

## 2013-10-11 ENCOUNTER — Ambulatory Visit (INDEPENDENT_AMBULATORY_CARE_PROVIDER_SITE_OTHER): Payer: BC Managed Care – PPO | Admitting: Family Medicine

## 2013-10-11 ENCOUNTER — Encounter: Payer: Self-pay | Admitting: Family Medicine

## 2013-10-11 ENCOUNTER — Ambulatory Visit: Payer: BC Managed Care – PPO | Admitting: Family Medicine

## 2013-10-11 VITALS — BP 128/84 | HR 105 | Temp 98.5°F | Ht 67.0 in | Wt >= 6400 oz

## 2013-10-11 DIAGNOSIS — R7309 Other abnormal glucose: Secondary | ICD-10-CM

## 2013-10-11 DIAGNOSIS — F329 Major depressive disorder, single episode, unspecified: Secondary | ICD-10-CM

## 2013-10-11 DIAGNOSIS — I10 Essential (primary) hypertension: Secondary | ICD-10-CM

## 2013-10-11 DIAGNOSIS — Z125 Encounter for screening for malignant neoplasm of prostate: Secondary | ICD-10-CM

## 2013-10-11 DIAGNOSIS — F3289 Other specified depressive episodes: Secondary | ICD-10-CM

## 2013-10-11 DIAGNOSIS — R3915 Urgency of urination: Secondary | ICD-10-CM

## 2013-10-11 DIAGNOSIS — R972 Elevated prostate specific antigen [PSA]: Secondary | ICD-10-CM

## 2013-10-11 LAB — POCT URINALYSIS DIPSTICK
Bilirubin, UA: NEGATIVE
Glucose, UA: NEGATIVE
KETONES UA: NEGATIVE
Nitrite, UA: NEGATIVE
PH UA: 6
Spec Grav, UA: 1.02
UROBILINOGEN UA: 0.2

## 2013-10-11 MED ORDER — SOLIFENACIN SUCCINATE 5 MG PO TABS: 5.0000 mg | ORAL_TABLET | Freq: Every day | ORAL | Status: DC

## 2013-10-11 NOTE — Assessment & Plan Note (Signed)
Well controlled. Continue current medication.  

## 2013-10-11 NOTE — Patient Instructions (Addendum)
Due for CPX with fasting labs ( PSA too) prior in next few weeks to month.  Call [facility] about bariatric surgery seminar.  Start medicaiton for urinary urgency. We will call with culture results.

## 2013-10-11 NOTE — Assessment & Plan Note (Signed)
UA indeterminate. Send for culture as not enough for micro. Most likely more chronic in nature from bladder spasm. Treat with anticholinergic.

## 2013-10-11 NOTE — Progress Notes (Signed)
   Subjective:    Patient ID: Blake Garcia, male    DOB: [birth year], 58 y.o.   MRN: [id number]  HPI 58 year old male with morbid obesity , HTN, high cholesterol presents for multiple medical issues.  Hypertension:  Well controlled on diovan HCT, amlodipine BP Readings from Last 3 Encounters:  10/11/13 128/84  04/25/13 156/84  04/16/13 122/78  Using medication without problems or lightheadedness:  None Chest pain with exertion:None Edema:yes Short of breath: yes Average home BPs:120/70s, rarelyt > 465 systolic Other issues:  Wt Readings from Last 3 Encounters:  10/11/13 473 lb (214.551 kg)  04/25/13 463 lb 8 oz (210.242 kg)  04/16/13 460 lb 4 oz (208.768 kg)   He reports he has continued to try to lose weight in last year without success. He is trying to avoid fast food.  He reports he has urinary urgency, has to run to bathroom. Has had some urinary incontinence. Looking into getting pads.  Ongoing for . 6 month, gradually worsening. Mild dysuria, some odor in urine. No flow issues. Large UOP each time. emptys completely. Wakes up once or twice a night to urinate   Father with prostate cancer. Lab Results  Component Value Date   PSA 3.42 10/22/2012   PSA 4.19* 02/24/2011   PSA 5.43* 02/21/2011    Increased stress. Mother moving to [place]. He has to move with children. Getting set up with EAP at work for assistance. Depression, moderate control on venlafaxine XL.  Will address other issue at next OV. Possible lymphedema in right leg.. chronic swelling. Review of Systems  Constitutional: Positive for fatigue. Negative for fever.  HENT: Negative for ear pain.   Eyes: Negative for pain.  Respiratory: Negative for cough and shortness of breath.   Cardiovascular: Negative for chest pain.       Objective:   Physical Exam  Constitutional: Vital signs are normal. He appears well-developed and well-nourished.  Morbidly obese  HENT:  Head: Normocephalic.  Right Ear:  Hearing normal.  Left Ear: Hearing normal.  Nose: Nose normal.  Mouth/Throat: Oropharynx is clear and moist and mucous membranes are normal.  Neck: Trachea normal. Carotid bruit is not present. No mass and no thyromegaly present.  Cardiovascular: Normal rate, regular rhythm and normal pulses.  Exam reveals no gallop, no distant heart sounds and no friction rub.   No murmur heard. Left 2 plus nonpitting edema... Firm skin? lymphedema  Pulmonary/Chest: Effort normal and breath sounds normal. No respiratory distress.  Abdominal: Normal appearance and bowel sounds are normal. There is no hepatosplenomegaly. There is no tenderness. There is no rebound and no CVA tenderness.  Skin: Skin is warm, dry and intact. No rash noted.  Psychiatric: He has a normal mood and affect. His speech is normal and behavior is normal. Thought content normal.        Assessment & Plan:

## 2013-10-11 NOTE — Progress Notes (Signed)
Pre visit review using our clinic review tool, if applicable. No additional management support is needed unless otherwise documented below in the visit note. 

## 2013-10-11 NOTE — Assessment & Plan Note (Signed)
Due for re-eval. 

## 2013-10-13 LAB — URINE CULTURE
COLONY COUNT: NO GROWTH
Organism ID, Bacteria: NO GROWTH

## 2013-10-14 ENCOUNTER — Telehealth: Payer: Self-pay | Admitting: *Deleted

## 2013-10-14 ENCOUNTER — Telehealth: Payer: Self-pay | Admitting: Family Medicine

## 2013-10-14 ENCOUNTER — Encounter: Payer: Self-pay | Admitting: *Deleted

## 2013-10-14 NOTE — Telephone Encounter (Signed)
Received prior authorization from [facility] for Vesicare 5 mg.  Other therapeutic alternatives are oxybutynin, oxybutynin ER, tolterodine, trospoium or trospoium ER.  Please advise if you want to prescribe an alternative or proceed with prior authorization.

## 2013-10-14 NOTE — Telephone Encounter (Signed)
Relevant patient education assigned to patient using Emmi. ° °

## 2013-10-15 MED ORDER — TOLTERODINE TARTRATE ER 4 MG PO CP24: 4.0000 mg | ORAL_CAPSULE | Freq: Every day | ORAL | Status: DC

## 2013-10-15 NOTE — Telephone Encounter (Signed)
Patient notified Laurin Coder was not covered by his insurance.  New prescription for Detrol LA has been sent to his pharmacy.

## 2013-10-15 NOTE — Telephone Encounter (Signed)
Will change to tolterodine ER... detrol LA

## 2013-10-15 NOTE — Telephone Encounter (Signed)
Left message for patient to return my call.

## 2013-10-25 NOTE — Telephone Encounter (Signed)
Pt left v/m; pt said Detrol LA was too expensive and cost to pt over $100.00 and pt request prior auth for vesicare to be done; pt said with PA for vesicare and a coupon from manufacturer the cost will be more affordable. Pt request cb.

## 2013-10-25 NOTE — Telephone Encounter (Signed)
Prior Authorization for Vesicare 5 mg completed on CoverMyMeds.  Will await outcome.

## 2013-10-28 NOTE — Telephone Encounter (Signed)
Received fax prior authorization from [facility] for Vesicare 5 mg stating the criteria received was not the current form required for this members plan.  Form placed in Dr. Rometta Emery box for completion/signature.

## 2013-10-29 NOTE — Telephone Encounter (Signed)
Notify pt that for prior auth to be accepted her has to try and fail detrol la for 30 days... Has he done this or not bought it because it is too expensive?

## 2013-10-29 NOTE — Telephone Encounter (Signed)
Noted. If filled out form but it will be denied.

## 2013-10-29 NOTE — Telephone Encounter (Signed)
No he has picked it up but due to the cost he wanted [name] to try and get the Vesicare 5 mg approved.

## 2013-10-29 NOTE — Telephone Encounter (Signed)
Prior Authorization faxed to [place] at [phone].

## 2013-10-30 NOTE — Telephone Encounter (Signed)
Prior Authorization denied.  Current plan does not allow coverage of Vesicare 5 mg if the patient has not demonstrated an inadequate treatment response after at least a 30 day trial of a generic urinary antispasmodic.

## 2013-10-30 NOTE — Telephone Encounter (Signed)
Mr. Baugher notified by telephone.

## 2013-11-09 ENCOUNTER — Other Ambulatory Visit: Payer: Self-pay | Admitting: Family Medicine

## 2014-01-12 ENCOUNTER — Other Ambulatory Visit: Payer: Self-pay | Admitting: Family Medicine

## 2014-02-19 ENCOUNTER — Other Ambulatory Visit: Payer: Self-pay | Admitting: Family Medicine

## 2014-02-19 NOTE — Telephone Encounter (Signed)
Last office visit 10/11/2013.  Last Lipid 01/24/2013.  Ok to refill?

## 2014-02-20 ENCOUNTER — Telehealth: Payer: Self-pay | Admitting: Family Medicine

## 2014-02-20 NOTE — Telephone Encounter (Signed)
He needs appt for CPX with labs prior, refill simvastatin unitl then.

## 2014-02-20 NOTE — Telephone Encounter (Signed)
Shirlean Mylar, Will you please call Mr. Rooks to schedule CPX with fasting labs prior.  He will need to schedule this appointment before Dr. Diona Browner will refill his cholesterol medicine.  Thanks, Butch Penny

## 2014-02-21 ENCOUNTER — Telehealth: Payer: Self-pay | Admitting: Family Medicine

## 2014-02-21 NOTE — Telephone Encounter (Signed)
Left message for pt to call office please schedule appointment    Status: Signed       Shirlean Mylar,  Will you please call Mr. Milkovich to schedule CPX with fasting labs prior. He will need to schedule this appointment before Dr. Diona Browner will refill his cholesterol medicine.  Thanks, Anastasia Pall, MD at 02/20/2014 1:58 AM     Status: Signed        He needs appt for CPX with labs prior, refill simvastatin unitl then.

## 2014-02-24 NOTE — Telephone Encounter (Signed)
Left message for pt to call office please schedule appointment

## 2014-02-26 NOTE — Telephone Encounter (Signed)
Sent my chart message asking pt to call office to schedule cpx and labs

## 2014-04-21 ENCOUNTER — Other Ambulatory Visit: Payer: Self-pay | Admitting: Family Medicine

## 2014-04-22 ENCOUNTER — Other Ambulatory Visit: Payer: Self-pay | Admitting: Family Medicine

## 2014-05-20 ENCOUNTER — Other Ambulatory Visit: Payer: Self-pay | Admitting: Family Medicine

## 2014-05-26 ENCOUNTER — Other Ambulatory Visit: Payer: Self-pay | Admitting: *Deleted

## 2014-05-26 MED ORDER — VENLAFAXINE HCL ER 75 MG PO CP24: ORAL_CAPSULE | ORAL | Status: DC

## 2014-06-20 ENCOUNTER — Encounter: Payer: Self-pay | Admitting: Family Medicine

## 2014-06-20 ENCOUNTER — Ambulatory Visit (INDEPENDENT_AMBULATORY_CARE_PROVIDER_SITE_OTHER): Payer: BC Managed Care – PPO | Admitting: Family Medicine

## 2014-06-20 VITALS — BP 157/108 | HR 102 | Temp 98.4°F | Ht 68.0 in | Wt >= 6400 oz

## 2014-06-20 DIAGNOSIS — R3915 Urgency of urination: Secondary | ICD-10-CM

## 2014-06-20 DIAGNOSIS — R972 Elevated prostate specific antigen [PSA]: Secondary | ICD-10-CM

## 2014-06-20 DIAGNOSIS — F321 Major depressive disorder, single episode, moderate: Secondary | ICD-10-CM

## 2014-06-20 DIAGNOSIS — A630 Anogenital (venereal) warts: Secondary | ICD-10-CM

## 2014-06-20 DIAGNOSIS — I10 Essential (primary) hypertension: Secondary | ICD-10-CM

## 2014-06-20 DIAGNOSIS — E785 Hyperlipidemia, unspecified: Secondary | ICD-10-CM

## 2014-06-20 LAB — COMPREHENSIVE METABOLIC PANEL
ALK PHOS: 72 U/L (ref 39–117)
ALT: 17 U/L (ref 0–53)
AST: 14 U/L (ref 0–37)
Albumin: 3.5 g/dL (ref 3.5–5.2)
BILIRUBIN TOTAL: 0.5 mg/dL (ref 0.2–1.2)
BUN: 15 mg/dL (ref 6–23)
CO2: 31 mEq/L (ref 19–32)
Calcium: 9 mg/dL (ref 8.4–10.5)
Chloride: 100 mEq/L (ref 96–112)
Creatinine, Ser: 1.1 mg/dL (ref 0.4–1.5)
GFR: 72.39 mL/min (ref >60.00)
Glucose, Bld: 111 mg/dL — ABNORMAL HIGH (ref 70–99)
Potassium: 4.1 mEq/L (ref 3.5–5.1)
SODIUM: 139 meq/L (ref 135–145)
TOTAL PROTEIN: 6.4 g/dL (ref 6.0–8.3)

## 2014-06-20 LAB — LIPID PANEL
CHOL/HDL RATIO: 4
CHOLESTEROL: 197 mg/dL (ref 0–200)
HDL: 52 mg/dL (ref >39.00)
LDL CALC: 131 mg/dL — AB (ref 0–99)
NonHDL: 145
Triglycerides: 68 mg/dL (ref 0.0–149.0)
VLDL: 13.6 mg/dL (ref 0.0–40.0)

## 2014-06-20 LAB — PSA: PSA: 2.45 ng/mL (ref 0.10–4.00)

## 2014-06-20 NOTE — Progress Notes (Signed)
Pre visit review using our clinic review tool, if applicable. No additional management support is needed unless otherwise documented below in the visit note. 

## 2014-06-20 NOTE — Progress Notes (Signed)
The patient is here for annual wellness exam and preventative care.  He also ahs the following chronic health issues.  He is having difficulty losing weight. He cannot exercise due to weight and breathing issues. This is limiting him a lot. He is considering bariatric surgery, but has not looked into this any further. Weight loss program at work did not help Trying to eat more healthy. Limiting portion size. Wt Readings from Last 3 Encounters:  06/20/14 468 lb 8 oz (212.51 kg)  10/11/13 473 lb (214.551 kg)  04/25/13 463 lb 8 oz (210.242 kg)   Mother ill but doing better in [place].. Stressed out about this and work. He has moved  Elevated Cholesterol: Due for re-eval.On simvastatin 40 mg daily  Lab Results  Component Value Date   CHOL 170 01/24/2013   HDL 58.20 01/24/2013   LDLCALC 95 01/24/2013   LDLDIRECT 153.9 09/10/2007   TRIG 85.0 01/24/2013   CHOLHDL 3 01/24/2013  Using medications without problems:None Muscle aches: None Diet compliance: Moderate. Exercise: None, limited by breathing. Other complaints:  Hypertension:  Poor control on amlodipine, diovan HCT. He has not taken him meds yet today. BP Readings from Last 3 Encounters:  06/20/14 157/108  10/11/13 128/84  04/25/13 156/84  Using medication without problems or lightheadedness: NOne Chest pain with exertion: Edema: Short of breath: See above Average home BPs:  130/80 Other issues:  Prediabetes: not improved.  Depression: Moderate control on effexor. No SI. Under a lot of stress.  He has a genital wart on  Wilfred Curtis that has been treated with cryotherapy, but it has returned now. He wishes to have it frozen again.  Review of Systems  Constitutional: Negative for fever, fatigue and unexpected weight change.  HENT: Negative for ear pain, congestion, sore throat, rhinorrhea, trouble swallowing and postnasal drip.  Eyes: Negative for pain.  Respiratory: Positive for shortness of breath. Negative for cough  and wheezing.  Cardiovascular: Negative for chest pain, palpitations and leg swelling.  Gastrointestinal: Negative for nausea, abdominal pain, diarrhea, constipation and blood in stool.  Genitourinary: Negative for dysuria, urgency, hematuria, discharge, penile swelling, scrotal swelling, difficulty urinating, penile pain and testicular pain.  Skin: Negative for rash.  Neurological: Negative for syncope, weakness, light-headedness, numbness and headaches.  Psychiatric/Behavioral: Negative for behavioral problems and dysphoric mood. The patient is not nervous/anxious.       Objective:   Physical Exam  Constitutional: He appears well-developed and well-nourished. Non-toxic appearance. He does not appear ill. No distress.  Morbid obesity  HENT:  Head: Normocephalic and atraumatic.  Right Ear: Hearing, tympanic membrane, external ear and ear canal normal.  Left Ear: Hearing, tympanic membrane, external ear and ear canal normal.  Nose: Nose normal.  Mouth/Throat: Uvula is midline, oropharynx is clear and moist and mucous membranes are normal.  Small oropharynx  Eyes: Conjunctivae, EOM and lids are normal. Pupils are equal, round, and reactive to light. No foreign bodies found.  Neck: Trachea normal, normal range of motion and phonation normal. Neck supple. Carotid bruit is not present. No mass and no thyromegaly present.  Short neck  Cardiovascular: Normal rate, regular rhythm, S1 normal, S2 normal, intact distal pulses and normal pulses. Exam reveals no gallop.  No murmur heard. Pulmonary/Chest: Breath sounds normal. He has no wheezes. He has no rhonchi. He has no rales.  Abdominal: Soft. Normal appearance and bowel sounds are normal. There is no hepatosplenomegaly. There is no tenderness. There is no rebound, no guarding and no CVA tenderness. No  hernia.  Genitourinary: Prostate is not tender. Mild enlargement  PROCEDURE:  2 WARTS ON PENIS (ONE ON HEAD AND ONE ON INFERIOR  SHAFT). TREATED WITH 3 FREEZE THAW CYCLES WITH LIQUID NITROGEN. NO COMPLICAITONS Lymphadenopathy:   He has no cervical adenopathy.  Neurological: He is alert. He has normal strength and normal reflexes. No cranial nerve deficit or sensory deficit. Gait normal.  Skin: Skin is warm, dry and intact. No rash noted.  Psychiatric: He has a normal mood and affect. His speech is normal and behavior is normal. Judgment normal.    Assessment & Plan:  The patient's preventative maintenance and recommended screening tests for an annual wellness exam were reviewed in full today. Brought up to date unless services declined.  Counselled on the importance of diet, exercise, and its role in overall health and mortality. The patient's FH and SH was reviewed, including their home life, tobacco status, and drug and alcohol status.   Vaccines: uptodate with Td, get flu at work Colon: 2009 High grade adenoma Dr. Carlean Purl, repeat in 5 years.[name] OVERDUE PSA: Due for re-eval. Saw [facility] in past for elevation. Plans to follow up. Lab Results  Component Value Date   PSA 3.42 10/22/2012   PSA 4.19* 02/24/2011   PSA 5.43* 02/21/2011  Nonsmoker

## 2014-06-20 NOTE — Assessment & Plan Note (Signed)
Well controlled. Continue current medication.  

## 2014-06-20 NOTE — Assessment & Plan Note (Signed)
Well controlled at home, off med today in office.. Continue current medication.

## 2014-06-20 NOTE — Assessment & Plan Note (Signed)
Due for re-eval. 

## 2014-06-20 NOTE — Patient Instructions (Addendum)
Stop at lab on way out.  Take BP meds as soon as possible.  Get on track with working on healthy eating , move as much as you can.  Call [facility] for  seminar on bariatric surgery.  Schedule colonoscopy with Dr.Gessner,  GI.

## 2014-06-20 NOTE — Assessment & Plan Note (Signed)
Detrol  LA not covered by insurance.

## 2014-07-19 ENCOUNTER — Other Ambulatory Visit: Payer: Self-pay | Admitting: Family Medicine

## 2014-08-28 ENCOUNTER — Other Ambulatory Visit: Payer: Self-pay | Admitting: Family Medicine

## 2014-09-13 ENCOUNTER — Other Ambulatory Visit: Payer: Self-pay | Admitting: Family Medicine

## 2014-09-13 NOTE — Telephone Encounter (Signed)
Last filled 05/26/14 #90--last OV CPE 06/20/2014--please advise

## 2014-09-21 ENCOUNTER — Other Ambulatory Visit: Payer: Self-pay | Admitting: Family Medicine

## 2014-11-28 ENCOUNTER — Ambulatory Visit (INDEPENDENT_AMBULATORY_CARE_PROVIDER_SITE_OTHER): Payer: BLUE CROSS/BLUE SHIELD | Admitting: Family Medicine

## 2014-11-28 ENCOUNTER — Encounter: Payer: Self-pay | Admitting: Family Medicine

## 2014-11-28 ENCOUNTER — Ambulatory Visit: Payer: Self-pay | Admitting: Family Medicine

## 2014-11-28 VITALS — BP 136/107 | HR 92 | Temp 98.2°F | Ht 68.0 in | Wt >= 6400 oz

## 2014-11-28 DIAGNOSIS — R0609 Other forms of dyspnea: Secondary | ICD-10-CM | POA: Diagnosis not present

## 2014-11-28 DIAGNOSIS — R3915 Urgency of urination: Secondary | ICD-10-CM

## 2014-11-28 DIAGNOSIS — Z6841 Body Mass Index (BMI) 40.0 and over, adult: Secondary | ICD-10-CM

## 2014-11-28 DIAGNOSIS — Z8249 Family history of ischemic heart disease and other diseases of the circulatory system: Secondary | ICD-10-CM | POA: Diagnosis not present

## 2014-11-28 DIAGNOSIS — F321 Major depressive disorder, single episode, moderate: Secondary | ICD-10-CM

## 2014-11-28 DIAGNOSIS — I4510 Unspecified right bundle-branch block: Secondary | ICD-10-CM

## 2014-11-28 DIAGNOSIS — M79661 Pain in right lower leg: Secondary | ICD-10-CM | POA: Insufficient documentation

## 2014-11-28 MED ORDER — TOLTERODINE TARTRATE ER 4 MG PO CP24: 4.0000 mg | ORAL_CAPSULE | Freq: Every day | ORAL | Status: DC

## 2014-11-28 MED ORDER — LIRAGLUTIDE 18 MG/3ML ~~LOC~~ SOPN: 0.6000 mg | PEN_INJECTOR | Freq: Every day | SUBCUTANEOUS | Status: DC

## 2014-11-28 NOTE — Patient Instructions (Addendum)
Increase venlafaxine to 150 mg daily.  Can start detrol LA daily for incontinence. Start victoza daily Start med one at a time. Follow up 30 min OV in 1 month. Stop at front desk for cardiology referral.

## 2014-11-28 NOTE — Assessment & Plan Note (Signed)
Trial of detrol LA.

## 2014-11-28 NOTE — Progress Notes (Signed)
   Subjective:    Patient ID: Blake Garcia, male    DOB: 19-May-1956, 59 y.o.   MRN: [id number]  HPI  59 year old male with super obesity, HTN, diastolic CHF, OSA presents with multiple issues.  He reports he has been struggling with depression. Has anhedonia, no energy. Increase in stress, selling house Mom moved to [place], hard for him to visit. Sleeping fairly well at night. Occ SI, no HI. No plan, kids keep him here.  He is currently on venlafaxine 75 mg daily This helped some initially when started 3 years ago, not helping as much now.    Shortness of breath, gradually worsening. Mainly with DOE. Falling asleep lately.  No pain in chest with walking, but tender spot in left chest at rest, pain with palpation.  Wakes him up at night. Ongoing in last year.  Yesterday also had 6/10 on pain scale.different chest pain, tightness in chest while driving, sweats.  In past saw Dr. Marcelline Deist years ago, had stress test in 2000, low risk. Father with history of CAD, CVA  age 10 in father.  Brother with MI age 15ish.   No cough. Nonsmoker.  Pain in right leg. In area where he has chronic swelling and  Hx of cellulitis. Hard to get on compression stockings. Elevating legs when he can. Only bothers him when he is walking.  He is the heaviest he has ever been.  He is too heavy for bariatric surgery. He has prediabetes in past. Body mass index is 73 kg/(m^2).    Review of Systems  Constitutional: Negative for fatigue.  HENT: Negative for ear pain.   Respiratory: Positive for shortness of breath. Negative for cough and wheezing.   Cardiovascular: Positive for chest pain and leg swelling. Negative for palpitations.  Gastrointestinal: Negative for constipation.  Genitourinary: Positive for frequency. Negative for dysuria, hematuria and penile pain.       Objective:   Physical Exam  Constitutional: Vital signs are normal. He appears well-developed and well-nourished.  Morbidly obese    HENT:  Head: Normocephalic.  Right Ear: Hearing normal.  Left Ear: Hearing normal.  Nose: Nose normal.  Mouth/Throat: Oropharynx is clear and moist and mucous membranes are normal.  Neck: Trachea normal. Carotid bruit is not present. No thyroid mass and no thyromegaly present.  Cardiovascular: Normal rate, regular rhythm and normal pulses.  Exam reveals no gallop, no distant heart sounds and no friction rub.   No murmur heard. Left 2 plus nonpitting edema... Firm skin? lymphedema  Pulmonary/Chest: Effort normal and breath sounds normal. No respiratory distress. Chest wall is not dull to percussion. He exhibits tenderness and bony tenderness. He exhibits no mass.    Area of ttp  Abdominal: Normal appearance and bowel sounds are normal. There is no hepatosplenomegaly. There is no tenderness. There is no rebound and no CVA tenderness.  Skin: Skin is warm, dry and intact. No rash noted.  Psychiatric: He has a normal mood and affect. His speech is normal and behavior is normal. Thought content normal.          Assessment & Plan:

## 2014-11-28 NOTE — Assessment & Plan Note (Signed)
Secondary to past cellulitis and severe swelling changes, now resolved.  recommend weight loss to improve pain

## 2014-11-28 NOTE — Assessment & Plan Note (Signed)
Poor control. Double dose of venlafaxine to 150 mg daily. Hopefull will help with weight loss SE as well.

## 2014-11-28 NOTE — Progress Notes (Signed)
Pre visit review using our clinic review tool, if applicable. No additional management support is needed unless otherwise documented below in the visit note. 

## 2014-11-28 NOTE — Assessment & Plan Note (Signed)
Working on  Low fat, low carbdiet and increase in mobility. Start victoza for weight loss. Discussed bariatric surgery, likely has to losse weight before gastric bypass is feasible.  Close follow up in 1 month.

## 2014-11-28 NOTE — Assessment & Plan Note (Signed)
EKG without specific changes concerning for acute MI. He has significant risk factors given obesity, high chol, family history of premature CAD. Needs further cardiac eval but given super morbid obesity , i am unclear as to be evaluation technique. Will refer to cards for further eval.

## 2014-12-09 ENCOUNTER — Other Ambulatory Visit: Payer: Self-pay | Admitting: Family Medicine

## 2014-12-12 ENCOUNTER — Ambulatory Visit (INDEPENDENT_AMBULATORY_CARE_PROVIDER_SITE_OTHER): Payer: BLUE CROSS/BLUE SHIELD | Admitting: Cardiovascular Disease

## 2014-12-12 ENCOUNTER — Encounter: Payer: Self-pay | Admitting: Cardiovascular Disease

## 2014-12-12 VITALS — BP 138/90 | HR 102 | Ht 67.0 in | Wt >= 6400 oz

## 2014-12-12 DIAGNOSIS — R079 Chest pain, unspecified: Secondary | ICD-10-CM

## 2014-12-12 DIAGNOSIS — R0609 Other forms of dyspnea: Secondary | ICD-10-CM | POA: Diagnosis not present

## 2014-12-12 NOTE — Progress Notes (Signed)
Primary care physician: Dr. Diona Browner  HPI  [name] who was referred for evaluation of exertional dyspnea and chest pain. He has multiple chronic medical conditions that include morbid obesity, hypertension, hyperlipidemia and sleep apnea on CPAP. His weight is 480 pounds. He has noticed progressive symptoms of exertional dyspnea now happening with minimal activities. He had recent episodes of substernal chest tightness associated with this and thus was concerned about his symptoms. No orthopnea or PND. He reports having cardiac workup more than 10 years ago by Dr. Clayborn Garcia which was negative. The patient is trying to lose weight and considering gastric bypass. He has family history of coronary artery disease but not prematurely.  No Known Allergies   Current Outpatient Prescriptions on File Prior to Visit  Medication Sig Dispense Refill  . amLODipine (NORVASC) 10 MG tablet TAKE 1 TABLET BY MOUTH EVERY DAY 90 tablet 1  . aspirin EC 81 MG tablet Take 81 mg by mouth daily.    . cholecalciferol (VITAMIN D) 1000 UNITS tablet Take 1,000 Units by mouth daily.    . Liraglutide 18 MG/3ML SOPN Inject 0.1 mLs (0.6 mg total) into the skin at bedtime. 3 mL 11  . potassium chloride SA (KLOR-CON M20) 20 MEQ tablet TAKE 1 TABLET (20 MEQ TOTAL) BY MOUTH DAILY. 90 tablet 1  . simvastatin (ZOCOR) 20 MG tablet TAKE 1 TABLET (20 MG TOTAL) BY MOUTH AT BEDTIME. 90 tablet 0  . valsartan-hydrochlorothiazide (DIOVAN-HCT) 320-25 MG per tablet TAKE 1 TABLET BY MOUTH DAILY. 90 tablet 1  . venlafaxine XR (EFFEXOR-XR) 75 MG 24 hr capsule TAKE ONE CAPSULE BY MOUTH EVERY DAY 90 capsule 0  . tolterodine (DETROL LA) 4 MG 24 hr capsule Take 1 capsule (4 mg total) by mouth daily. (Patient not taking: Reported on 12/12/2014) 30 capsule 11   No current facility-administered medications on file prior to visit.     Past Medical History  Diagnosis Date  . Depression   . Hyperlipidemia   . Hypertension   .  Kidney stones   . Complication of anesthesia ~ 2000    "during OR for kidney stones; anesthesia RX made me code twice in one week"  . Cellulitis 02/09/2012    RLE  . OSA (obstructive sleep apnea)     "wear CPAP"  . History of blood transfusion 1958    "I was an Rh baby"  . Basal cell carcinoma [id number]    Blake Garcia  . Personal history of colonic adenoma 09/14/2012     Past Surgical History  Procedure Laterality Date  . Lithotripsy  2000  . Esophagogastroduodenoscopy    . Knee arthroscopy  ~ 2004    right  . Cholecystectomy  1993     Family History  Problem Relation Age of Onset  . Hypertension Mother   . Heart disease Mother   . Heart disease Father   . Stroke Father   . Heart disease Brother   . Coronary artery disease Brother   . Diabetes Brother   . Cancer Maternal Aunt     breast  . Cancer Maternal Uncle     lung  . Heart disease Brother   . Coronary artery disease Brother      History   Social History  . Marital Status: Single    Spouse Name: N/A  . Number of Children: 3  . Years of Education: N/A   Occupational History  . Pharmacy tech    Social History Main Topics  .  Smoking status: Never Smoker   . Smokeless tobacco: Never Used  . Alcohol Use: No     Comment: 02/09/12 "may have driink at party once/year"  . Drug Use: No  . Sexual Activity: Not Currently   Other Topics Concern  . Not on file   Social History Narrative   Regular exercise-- walks one time a week      Diet: 2 meals-skips dinner, cereal,salad, soda(cheerwine diet)     ROS A 10 point review of system was performed. It is negative other than that mentioned in the history of present illness.   PHYSICAL EXAM   BP 138/90 mmHg  Pulse 102  Ht 5\' 7"  (1.702 m)  Wt 460 lb (208.655 kg)  BMI 72.03 kg/m2 Constitutional: He is oriented to person, place, and time. He appears well-developed and well-nourished. No distress.  HENT: No nasal discharge.  Head: Normocephalic  and atraumatic.  Eyes: Pupils are equal and round.  No discharge. Neck: Normal range of motion. Neck supple. No JVD present. No thyromegaly present.  Cardiovascular: Normal rate, regular rhythm, normal heart sounds. Exam reveals no gallop and no friction rub. No murmur heard.  Pulmonary/Chest: Effort normal and breath sounds normal. No stridor. No respiratory distress. He has no wheezes. He has no rales. He exhibits no tenderness.  Abdominal: Soft. Bowel sounds are normal. He exhibits no distension. There is no tenderness. There is no rebound and no guarding.  Musculoskeletal: Normal range of motion. He exhibits no edema and no tenderness.  Neurological: He is alert and oriented to person, place, and time. Coordination normal.  Skin: Skin is warm and dry. No rash noted. He is not diaphoretic. No erythema. No pallor.  Psychiatric: He has a normal mood and affect. His behavior is normal. Judgment and thought content normal.       EKG: Sinus  Tachycardia  -Right bundle branch block with left axis -bifascicular block.   -Inferior infarct -age undetermined  -Old anteroseptal infarct.   ABNORMAL     ASSESSMENT AND PLAN

## 2014-12-12 NOTE — Patient Instructions (Signed)
Medication Instructions:  Your physician recommends that you continue on your current medications as directed. Please refer to the Current Medication list given to you today.   Labwork: None  Testing/Procedures: Your physician has requested that you have an echocardiogram. Echocardiography is a painless test that uses sound waves to create images of your heart. It provides your doctor with information about the size and shape of your heart and how well your heart's chambers and valves are working. This procedure takes approximately one hour. There are no restrictions for this procedure.    Follow-Up: Your physician recommends that you schedule a follow-up appointment in: 1 MONTH with Dr. Fletcher Anon.   Any Other Special Instructions Will Be Listed Below (If Applicable).

## 2014-12-12 NOTE — Assessment & Plan Note (Signed)
His symptoms are definitely concerning considering his multiple risk factors for coronary artery disease. Nonetheless, his dyspnea could be multifactorial due to morbid obesity and physical deconditioning. His symptoms have been gradual. He is also at increased risk risk for chronic pulmonary thromboembolic disease. I requested an echocardiogram as a starting point. If he has significant pulmonary hypertension or right-sided enlargement, further evaluation for chronic pulmonary embolism is indicated. Otherwise, further ischemic cardiac evaluation is needed. However, this is limited by his morbid obesity. Stress testing is not accurate in this situation. Cardiac catheterization is limited by the weight limit of the cath table. I might be able to do this at [facility] via the radial artery approach. This will be decided based on the results of echocardiogram.

## 2014-12-19 ENCOUNTER — Ambulatory Visit: Payer: BLUE CROSS/BLUE SHIELD | Admitting: Family Medicine

## 2014-12-26 ENCOUNTER — Ambulatory Visit (INDEPENDENT_AMBULATORY_CARE_PROVIDER_SITE_OTHER): Payer: BLUE CROSS/BLUE SHIELD | Admitting: Family Medicine

## 2014-12-26 ENCOUNTER — Other Ambulatory Visit: Payer: Self-pay | Admitting: Family Medicine

## 2014-12-26 ENCOUNTER — Encounter: Payer: Self-pay | Admitting: Family Medicine

## 2014-12-26 VITALS — BP 134/100 | HR 93 | Temp 98.3°F | Ht 68.0 in | Wt >= 6400 oz

## 2014-12-26 DIAGNOSIS — R3915 Urgency of urination: Secondary | ICD-10-CM | POA: Diagnosis not present

## 2014-12-26 DIAGNOSIS — F331 Major depressive disorder, recurrent, moderate: Secondary | ICD-10-CM

## 2014-12-26 DIAGNOSIS — Z6841 Body Mass Index (BMI) 40.0 and over, adult: Secondary | ICD-10-CM

## 2014-12-26 MED ORDER — "PEN NEEDLES 5/16"" 30G X 8 MM MISC": Status: DC

## 2014-12-26 MED ORDER — TRIAMCINOLONE ACETONIDE 0.1 % EX CREA: 1.0000 "application " | TOPICAL_CREAM | Freq: Two times a day (BID) | CUTANEOUS | Status: DC

## 2014-12-26 MED ORDER — LIRAGLUTIDE 18 MG/3ML ~~LOC~~ SOPN: 1.2000 mg | PEN_INJECTOR | Freq: Every day | SUBCUTANEOUS | Status: DC

## 2014-12-26 NOTE — Patient Instructions (Addendum)
Increase victoza to 1.2 mcg daily.   Keep working on healthy eating and exercise.  Follow up in 1 month for weight eval 30 min OV.

## 2014-12-26 NOTE — Progress Notes (Signed)
Pre visit review using our clinic review tool, if applicable. No additional management support is needed unless otherwise documented below in the visit note. 

## 2014-12-26 NOTE — Progress Notes (Signed)
   Subjective:    Patient ID: Blake Garcia, male    DOB: January 11, 1956, 59 y.o.   MRN: [id number]  HPI   59 year old male with super obesity, HTN, diastolic CHF, OSA presents with multiple issues here for follow up 1 month.  Depression: At last OV increase venlafaxine to 150 mg daily. Has improved mood significantly. No SI.   At last OV:  Shortness of breath, gradually worsening. Mainly with DOE. Falling asleep lately. No pain in chest with walking, but tender spot in left chest at rest, pain with palpation. Wakes him up at night. Ongoing in last year. Yesterday also had 6/10 on pain scale.different chest pain, tightness in chest while driving, sweats.  In past saw Dr. Marcelline Deist years ago, had stress test in 2000, low risk. Father with history of CAD, CVA age 20 in father. Brother with MI age 40ish.   Referred to Cards. Echo pending.  SOB is better with some of the weight loss.  Morbid obesity:At last OV started on victoza daily. No SE associated.He has lost about 25 lbs. He has noted decrease in appetite.  He has been working on [employer], trying to move more.  No fast food. Wt Readings from Last 3 Encounters:  12/26/14 457 lb 8 oz (207.521 kg)  12/12/14 460 lb (208.655 kg)  11/28/14 480 lb (217.727 kg)   Urinary incontinence: Started on detrol LA in last 2 weeks. Has noted some increase in control of urge. No SE, no sedation, no constipation.  Review of Systems  Constitutional: Negative for fatigue.  HENT: Negative for ear pain.   Eyes: Negative for pain.  Respiratory: Positive for shortness of breath. Negative for cough.   Cardiovascular: Positive for leg swelling. Negative for chest pain and palpitations.  Gastrointestinal: Negative for constipation.       Objective:   Physical Exam  Constitutional: Vital signs are normal. He appears well-developed and well-nourished.  Morbidly obese  HENT:  Head: Normocephalic.  Right Ear: Hearing normal.  Left Ear:  Hearing normal.  Nose: Nose normal.  Mouth/Throat: Oropharynx is clear and moist and mucous membranes are normal.  Neck: Trachea normal. Carotid bruit is not present. No thyroid mass and no thyromegaly present.  Cardiovascular: Normal rate, regular rhythm and normal pulses.  Exam reveals no gallop, no distant heart sounds and no friction rub.   No murmur heard. Left 2 plus nonpitting edema... Firm skin? lymphedema  Pulmonary/Chest: Effort normal and breath sounds normal. No respiratory distress. Chest wall is not dull to percussion. He exhibits tenderness and bony tenderness. He exhibits no mass.  Area of ttp  Abdominal: Normal appearance and bowel sounds are normal. There is no hepatosplenomegaly. There is no tenderness. There is no rebound and no CVA tenderness.  Skin: Skin is warm, dry and intact. No rash noted.  Psychiatric: He has a normal mood and affect. His speech is normal and behavior is normal. Thought content normal.          Assessment & Plan:

## 2014-12-26 NOTE — Assessment & Plan Note (Signed)
Improved some on detrol LA.. Only on for 2 weeks. No SE at this time.  Very expensive for him.

## 2014-12-26 NOTE — Assessment & Plan Note (Signed)
Significant improvement in mood with increase in venlafaxine to 150 mg daily.

## 2014-12-26 NOTE — Assessment & Plan Note (Signed)
He has had significant weight loss with increase in venlafaxine and victoza. Increase victoza to 1.2 mcg daily.  keep working on healthy eating and healthy habits. Exercise as tolerated.

## 2014-12-30 ENCOUNTER — Other Ambulatory Visit: Payer: Self-pay

## 2014-12-30 ENCOUNTER — Ambulatory Visit (INDEPENDENT_AMBULATORY_CARE_PROVIDER_SITE_OTHER): Payer: BLUE CROSS/BLUE SHIELD

## 2014-12-30 DIAGNOSIS — R079 Chest pain, unspecified: Secondary | ICD-10-CM

## 2015-01-13 ENCOUNTER — Encounter: Payer: Self-pay | Admitting: Cardiovascular Disease

## 2015-01-13 ENCOUNTER — Ambulatory Visit (INDEPENDENT_AMBULATORY_CARE_PROVIDER_SITE_OTHER): Payer: 59 | Admitting: Cardiovascular Disease

## 2015-01-13 VITALS — BP 134/88 | HR 78 | Ht 67.0 in | Wt >= 6400 oz

## 2015-01-13 DIAGNOSIS — R0609 Other forms of dyspnea: Secondary | ICD-10-CM | POA: Diagnosis not present

## 2015-01-13 DIAGNOSIS — Z6841 Body Mass Index (BMI) 40.0 and over, adult: Secondary | ICD-10-CM

## 2015-01-13 DIAGNOSIS — I10 Essential (primary) hypertension: Secondary | ICD-10-CM | POA: Diagnosis not present

## 2015-01-13 NOTE — Patient Instructions (Signed)
Medication Instructions: Continue same medications.   Labwork: None.   Procedures/Testing: None.   Follow-Up: 6 months with Dr. Jeanetta Alonzo.   Any Additional Special Instructions Will Be Listed Below (If Applicable).   

## 2015-01-13 NOTE — Assessment & Plan Note (Signed)
His dyspnea is likely multifactorial due to morbid obesity and physical deconditioning. His symptoms have been gradual. Echocardiogram showed low normal LV systolic function with no significant valvular abnormalities. However, the study was suboptimal due to obesity. Underlying coronary artery disease has not been excluded. Evaluation of this is difficult due to morbid obesity. Stress testing is likely not accurate. Given that he reports improvement in his symptoms and no further episodes of substernal chest tightness, it's probably best to continue treatment of risk factors for now. If he develops any recurrent chest pain, cardiac catheterization can be pursued.

## 2015-01-13 NOTE — Assessment & Plan Note (Signed)
We again discussed the importance of diet and some form of exercise. I suggested water aerobics as a starting point.

## 2015-01-13 NOTE — Assessment & Plan Note (Signed)
Blood pressure is reasonably controlled on current medications. 

## 2015-01-13 NOTE — Progress Notes (Signed)
Primary care physician: Dr. Diona Browner  HPI  [name] who is here today for a follow-up visit regarding of exertional dyspnea and chest pain. He has multiple chronic medical conditions that include morbid obesity, hypertension, hyperlipidemia and sleep apnea on CPAP. His weight is 480 pounds. He was seen recently for progressive symptoms of exertional dyspnea  with minimal activities. He had one episode of substernal chest tightness. The patient is trying to lose weight and considering gastric bypass. He has family history of coronary artery disease but not prematurely. Echocardiogram showed low normal LV systolic function with an ejection fraction of 50-55% with grade 1 diastolic dysfunction. Pulmonary pressure could not be estimated. The study was very suboptimal due to obesity. He has been trying to lose weight and following a healthier diet. He lost some weight and actually feels somewhat better. He has not had any further episodes of chest tightness.  No Known Allergies   Current Outpatient Prescriptions on File Prior to Visit  Medication Sig Dispense Refill  . amLODipine (NORVASC) 10 MG tablet TAKE 1 TABLET BY MOUTH EVERY DAY 90 tablet 1  . aspirin EC 81 MG tablet Take 81 mg by mouth daily.    . cholecalciferol (VITAMIN D) 1000 UNITS tablet Take 1,000 Units by mouth daily.    . Insulin Pen Needle (PEN NEEDLES 5/16") 30G X 8 MM MISC Use to inject insulin once daily 100 each 3  . Liraglutide 18 MG/3ML SOPN Inject 0.2 mLs (1.2 mg total) into the skin at bedtime. 3 mL 11  . potassium chloride SA (KLOR-CON M20) 20 MEQ tablet TAKE 1 TABLET (20 MEQ TOTAL) BY MOUTH DAILY. 90 tablet 1  . simvastatin (ZOCOR) 20 MG tablet TAKE 1 TABLET (20 MG TOTAL) BY MOUTH AT BEDTIME. 90 tablet 1  . tolterodine (DETROL LA) 4 MG 24 hr capsule Take 1 capsule (4 mg total) by mouth daily. 30 capsule 11  . triamcinolone cream (KENALOG) 0.1 % Apply 1 application topically 2 (two) times daily. 30 g 0  .  valsartan-hydrochlorothiazide (DIOVAN-HCT) 320-25 MG per tablet TAKE 1 TABLET BY MOUTH DAILY. 90 tablet 1  . venlafaxine XR (EFFEXOR-XR) 75 MG 24 hr capsule Take 2 capsules (150 mg total) by mouth daily. 180 capsule 1   No current facility-administered medications on file prior to visit.     Past Medical History  Diagnosis Date  . Depression   . Hyperlipidemia   . Hypertension   . Kidney stones   . Complication of anesthesia ~ 2000    "during OR for kidney stones; anesthesia RX made me code twice in one week"  . Cellulitis 02/09/2012    RLE  . OSA (obstructive sleep apnea)     "wear CPAP"  . History of blood transfusion 1958    "I was an Rh baby"  . Basal cell carcinoma [id number]    [place]  . Personal history of colonic adenoma 09/14/2012     Past Surgical History  Procedure Laterality Date  . Lithotripsy  2000  . Esophagogastroduodenoscopy    . Knee arthroscopy  ~ 2004    right  . Cholecystectomy  1993     Family History  Problem Relation Age of Onset  . Hypertension Mother   . Heart disease Mother   . Heart disease Father   . Stroke Father   . Heart disease Brother   . Coronary artery disease Brother   . Diabetes Brother   . Cancer Maternal Aunt  breast  . Cancer Maternal Uncle     lung  . Heart disease Brother   . Coronary artery disease Brother      History   Social History  . Marital Status: Single    Spouse Name: N/A  . Number of Children: 3  . Years of Education: N/A   Occupational History  . Pharmacy tech    Social History Main Topics  . Smoking status: Never Smoker   . Smokeless tobacco: Never Used  . Alcohol Use: No     Comment: 02/09/12 "may have driink at party once/year"  . Drug Use: No  . Sexual Activity: Not Currently   Other Topics Concern  . Not on file   Social History Narrative   Regular exercise-- walks one time a week      Diet: 2 meals-skips dinner, cereal,salad, soda(cheerwine diet)     ROS A 10  point review of system was performed. It is negative other than that mentioned in the history of present illness.   PHYSICAL EXAM   BP 134/88 mmHg  Pulse 78  Ht 5\' 7"  (1.702 m)  Wt 456 lb 4 oz (206.954 kg)  BMI 71.44 kg/m2 Constitutional: He is oriented to person, place, and time. He appears well-developed and well-nourished. No distress.  HENT: No nasal discharge.  Head: Normocephalic and atraumatic.  Eyes: Pupils are equal and round.  No discharge. Neck: Normal range of motion. Neck supple. No JVD present. No thyromegaly present.  Cardiovascular: Normal rate, regular rhythm, normal heart sounds. Exam reveals no gallop and no friction rub. No murmur heard.  Pulmonary/Chest: Effort normal and breath sounds normal. No stridor. No respiratory distress. He has no wheezes. He has no rales. He exhibits no tenderness.  Abdominal: Soft. Bowel sounds are normal. He exhibits no distension. There is no tenderness. There is no rebound and no guarding.  Musculoskeletal: Normal range of motion. He exhibits no edema and no tenderness.  Neurological: He is alert and oriented to person, place, and time. Coordination normal.  Skin: Skin is warm and dry. No rash noted. He is not diaphoretic. No erythema. No pallor.  Psychiatric: He has a normal mood and affect. His behavior is normal. Judgment and thought content normal.         ASSESSMENT AND PLAN

## 2015-01-16 ENCOUNTER — Other Ambulatory Visit: Payer: Self-pay | Admitting: Family Medicine

## 2015-01-27 ENCOUNTER — Encounter: Payer: Self-pay | Admitting: Family Medicine

## 2015-01-27 ENCOUNTER — Ambulatory Visit (INDEPENDENT_AMBULATORY_CARE_PROVIDER_SITE_OTHER): Payer: 59 | Admitting: Family Medicine

## 2015-01-27 VITALS — BP 136/84 | HR 92 | Temp 98.3°F | Ht 68.0 in | Wt >= 6400 oz

## 2015-01-27 DIAGNOSIS — R3915 Urgency of urination: Secondary | ICD-10-CM

## 2015-01-27 DIAGNOSIS — I10 Essential (primary) hypertension: Secondary | ICD-10-CM | POA: Diagnosis not present

## 2015-01-27 DIAGNOSIS — F331 Major depressive disorder, recurrent, moderate: Secondary | ICD-10-CM

## 2015-01-27 DIAGNOSIS — Z6841 Body Mass Index (BMI) 40.0 and over, adult: Secondary | ICD-10-CM

## 2015-01-27 DIAGNOSIS — G4730 Sleep apnea, unspecified: Secondary | ICD-10-CM | POA: Diagnosis not present

## 2015-01-27 NOTE — Assessment & Plan Note (Signed)
Improving on detrol LA

## 2015-01-27 NOTE — Assessment & Plan Note (Signed)
Well controlleld on venlafaxine

## 2015-01-27 NOTE — Patient Instructions (Addendum)
Stop at front desk on way out for sleep study referral. Continue current meds, keep ap the great work on lifestyle changes

## 2015-01-27 NOTE — Progress Notes (Signed)
Pre visit review using our clinic review tool, if applicable. No additional management support is needed unless otherwise documented below in the visit note. 

## 2015-01-27 NOTE — Assessment & Plan Note (Signed)
Well controlled. Continue current medication.  

## 2015-01-27 NOTE — Assessment & Plan Note (Addendum)
Body mass index is 69.65 kg/(m^2).   Continue improvement with victoza and lifestyle changes.  Pt refused increase in victoza at this time. Follow up in 3 months, may need to increase at that time if weight  Remaining stable without loss.

## 2015-01-27 NOTE — Progress Notes (Signed)
     59 year old male with super obesity, HTN, diastolic CHF, OSA presents with multiple issues here for follow up 1 month.  Depression: On  venlafaxine to 150 mg daily. Has improved mood significantly. He contnues tpo do well. No SI.  Morbid obesity: On victoza daily, now on 1.2 mcg daily. No SE associated. He has lost about 25 lbs initially but wt has remained stable since last OV. He has noted decrease in appetite. He has been working on [employer], trying to move more. No longer having bedtime snack. No fast food. Wt Readings from Last 3 Encounters:  01/13/15 456 lb 4 oz (206.954 kg)  12/26/14 457 lb 8 oz (207.521 kg)  12/12/14 460 lb (208.655 kg)  He has started walking more, trying to be more active with kids.  Wt Readings from Last 3 Encounters:  12/26/14 457 lb 8 oz (207.521 kg)  12/12/14 460 lb (208.655 kg)  11/28/14 480 lb (217.727 kg)   Urinary incontinence: Started on detrol LA in last 4 weeks. Has noted some increase in control of urge. Still not perfect.  No SE, no sedation, no constipation.   Saw cardiologist. No specific recs at this time. If cath needed can use table.   SOB is better with some of the weight loss.  Has follow up in 6 months.  Sleep apnea on CPAP. Machine he has is obsolete, no parts to it. Last sleep study was 2008  Currently does not need supplies. Occ machine not giving correct pressures.   HTN well controlled on current meds. BP Readings from Last 3 Encounters:  01/27/15 136/84  01/13/15 134/88  12/26/14 134/100        Review of Systems  Constitutional: Negative for fatigue.  HENT: Negative for ear pain.  Eyes: Negative for pain.  Respiratory: Positive for shortness of breath. Negative for cough.  Cardiovascular: Positive for leg swelling. Negative for chest pain and palpitations.  Gastrointestinal: Negative for constipation.       Objective:   Physical Exam  Constitutional: Vital signs are normal. He  appears well-developed and well-nourished.  Morbidly obese  HENT:  Head: Normocephalic.  Right Ear: Hearing normal.  Left Ear: Hearing normal.  Nose: Nose normal.  Mouth/Throat: Oropharynx is clear and moist and mucous membranes are normal.  Neck: Trachea normal. Carotid bruit is not present. No thyroid mass and no thyromegaly present.  Cardiovascular: Normal rate, regular rhythm and normal pulses. Exam reveals no gallop, no distant heart sounds and no friction rub.  No murmur heard. 1 plus nonpitting edema... Softer skin, no lymphedema Pulmonary/Chest: Effort normal and breath sounds normal. No respiratory distress. Chest wall is not dull to percussion.  Abdominal: Normal appearance and bowel sounds are normal. There is no hepatosplenomegaly. There is no tenderness. There is no rebound and no CVA tenderness.  Skin: Skin is warm, dry and intact. No rash noted.  Psychiatric: He has a normal mood and affect. His speech is normal and behavior is normal. Thought content normal.

## 2015-02-23 ENCOUNTER — Other Ambulatory Visit: Payer: Self-pay | Admitting: Family Medicine

## 2015-04-24 ENCOUNTER — Other Ambulatory Visit: Payer: Self-pay | Admitting: Family Medicine

## 2015-04-29 ENCOUNTER — Other Ambulatory Visit: Payer: Self-pay | Admitting: Family Medicine

## 2015-04-29 NOTE — Telephone Encounter (Signed)
Last office visit 01/27/2015.  Last refilled 12/26/2014 for 30 gram with no refills.  Ok to refill?

## 2015-05-05 ENCOUNTER — Ambulatory Visit: Payer: 59 | Admitting: Family Medicine

## 2015-05-07 ENCOUNTER — Ambulatory Visit: Payer: 59 | Admitting: Family Medicine

## 2015-05-23 ENCOUNTER — Other Ambulatory Visit: Payer: Self-pay | Admitting: Family Medicine

## 2015-05-28 ENCOUNTER — Telehealth: Payer: Self-pay | Admitting: *Deleted

## 2015-05-28 MED ORDER — VENLAFAXINE HCL ER 150 MG PO CP24
150.0000 mg | ORAL_CAPSULE | Freq: Every day | ORAL | Status: DC
Start: 2015-05-28 — End: 2015-05-29

## 2015-05-28 NOTE — Telephone Encounter (Signed)
[facility] stating insurance will not cover Venlafaxine ER 75 mg two capsules daily.  They will only cover one capsule daily.  According to last office visit patient is currently taking Venlafaxine 150 mg daily.  Will sent in new Rx for 150 mg capusule to take one capsule daily.

## 2015-05-29 MED ORDER — VENLAFAXINE HCL ER 150 MG PO CP24: 150.0000 mg | ORAL_CAPSULE | Freq: Every day | ORAL | Status: DC

## 2015-05-29 NOTE — Addendum Note (Signed)
Addended by: Eliezer Lofts E on: 05/29/2015 08:26 AM   Modules accepted: Orders

## 2015-05-29 NOTE — Telephone Encounter (Signed)
Left message for Blake Garcia that a new prescription for Venlafaxine ER 150 mg has been sent to [facility] [place]ward Fortes his insurance would not cover the 75 mg taking 2 capsules day so now this way he will only need to take one capsule daily.

## 2015-05-29 NOTE — Telephone Encounter (Signed)
Let pt know sent in rx for 150 mg 90 [facility].

## 2015-07-16 ENCOUNTER — Other Ambulatory Visit: Payer: Self-pay | Admitting: Family Medicine

## 2015-08-11 ENCOUNTER — Ambulatory Visit: Payer: 59 | Admitting: Nurse Practitioner

## 2015-09-01 ENCOUNTER — Ambulatory Visit (INDEPENDENT_AMBULATORY_CARE_PROVIDER_SITE_OTHER): Payer: 59 | Admitting: Family Medicine

## 2015-09-01 ENCOUNTER — Other Ambulatory Visit: Payer: Self-pay | Admitting: Family Medicine

## 2015-09-01 ENCOUNTER — Encounter: Payer: Self-pay | Admitting: Family Medicine

## 2015-09-01 VITALS — BP 138/82 | HR 101 | Temp 98.4°F | Ht 68.0 in | Wt >= 6400 oz

## 2015-09-01 DIAGNOSIS — R7309 Other abnormal glucose: Secondary | ICD-10-CM | POA: Diagnosis not present

## 2015-09-01 DIAGNOSIS — M79605 Pain in left leg: Secondary | ICD-10-CM

## 2015-09-01 DIAGNOSIS — I10 Essential (primary) hypertension: Secondary | ICD-10-CM

## 2015-09-01 DIAGNOSIS — E785 Hyperlipidemia, unspecified: Secondary | ICD-10-CM

## 2015-09-01 DIAGNOSIS — M545 Low back pain, unspecified: Secondary | ICD-10-CM | POA: Insufficient documentation

## 2015-09-01 DIAGNOSIS — Z6841 Body Mass Index (BMI) 40.0 and over, adult: Secondary | ICD-10-CM

## 2015-09-01 DIAGNOSIS — F331 Major depressive disorder, recurrent, moderate: Secondary | ICD-10-CM

## 2015-09-01 DIAGNOSIS — Z125 Encounter for screening for malignant neoplasm of prostate: Secondary | ICD-10-CM | POA: Diagnosis not present

## 2015-09-01 LAB — COMPREHENSIVE METABOLIC PANEL
ALBUMIN: 3.8 g/dL (ref 3.5–5.2)
ALK PHOS: 72 U/L (ref 39–117)
ALT: 17 U/L (ref 0–53)
AST: 14 U/L (ref 0–37)
BILIRUBIN TOTAL: 0.5 mg/dL (ref 0.2–1.2)
BUN: 15 mg/dL (ref 6–23)
CALCIUM: 9.6 mg/dL (ref 8.4–10.5)
CO2: 34 meq/L — AB (ref 19–32)
Chloride: 101 mEq/L (ref 96–112)
Creatinine, Ser: 1.1 mg/dL (ref 0.40–1.50)
GFR: 72.85 mL/min (ref >60.00)
Glucose, Bld: 128 mg/dL — ABNORMAL HIGH (ref 70–99)
Potassium: 3.9 mEq/L (ref 3.5–5.1)
Sodium: 142 mEq/L (ref 135–145)
TOTAL PROTEIN: 6.8 g/dL (ref 6.0–8.3)

## 2015-09-01 LAB — PSA: PSA: 1.9 ng/mL (ref 0.10–4.00)

## 2015-09-01 LAB — LIPID PANEL
CHOLESTEROL: 168 mg/dL (ref 0–200)
HDL: 60.6 mg/dL (ref >39.00)
LDL Cholesterol: 91 mg/dL (ref 0–99)
NonHDL: 107.59
TRIGLYCERIDES: 84 mg/dL (ref 0.0–149.0)
Total CHOL/HDL Ratio: 3
VLDL: 16.8 mg/dL (ref 0.0–40.0)

## 2015-09-01 MED ORDER — DICLOFENAC SODIUM 75 MG PO TBEC: 75.0000 mg | DELAYED_RELEASE_TABLET | Freq: Two times a day (BID) | ORAL | Status: DC

## 2015-09-01 MED ORDER — CYCLOBENZAPRINE HCL 10 MG PO TABS: 10.0000 mg | ORAL_TABLET | Freq: Every evening | ORAL | Status: DC | PRN

## 2015-09-01 NOTE — Assessment & Plan Note (Signed)
MSK strain or possible sciatica, also thoracic muscle strain.  Treat with NSAIDs, heat, muscle relaxant and start home stretching.

## 2015-09-01 NOTE — Progress Notes (Signed)
   Subjective:    Patient ID: Blake Garcia, male    DOB: 11/08/1955, 60 y.o.   MRN: [id number]  HPI 60 year old male with morbid obesity presents for follow up on multiple issues.  Morbid obesity:  Weight gain on victoza low dose.  He continues to work on [employer]. Has decreased fast food to 1 time a week.  He is limited with his activity due to low back pain. Wt Readings from Last 3 Encounters:  09/01/15 474 lb 12 oz (215.345 kg)  01/27/15 458 lb (207.747 kg)  01/13/15 456 lb 4 oz (206.954 kg)   No side effects.  He is strongly considering bariatric surgery.  Activity is limited with habitus, joint and SOB.   Due for cholesterol and diabetes screen. Overdue CPX.   Sleeping better with new CPAP machine.   Major depressive disorder:  Good control on venlafaxine 150 mg. Motivated.  Having some bad dreams in last week.  Hypertension:    Well controlled BP Readings from Last 3 Encounters:  09/01/15 138/82  01/27/15 136/84  01/13/15 134/88  Using medication without problems or lightheadedness: None Chest pain with exertion:None Edema: stable Short of breath: stable but limits exercise. Average home BPs:  Other issues:New onset right low back pain, radiates to right leg. Pain severe at times. Using ibuprofen 800 mg, heat .[name] helps some but not much.  N fall, no oknown injury.  No numbness, no weakness.  No history of  abck issues.  Social History /Family History/Past Medical History reviewed and updated if needed.   Review of Systems  Constitutional: Positive for fatigue. Negative for fever.  HENT: Negative for ear pain.   Respiratory: Positive for shortness of breath. Negative for wheezing.   Cardiovascular: Negative for chest pain.  Gastrointestinal: Negative for abdominal pain.       Objective:   Physical Exam  Constitutional: Vital signs are normal. He appears well-developed and well-nourished.  Morbidly obese  HENT:  Head: Normocephalic.  Right Ear:  Hearing normal.  Left Ear: Hearing normal.  Nose: Nose normal.  Mouth/Throat: Oropharynx is clear and moist and mucous membranes are normal.  Neck: Trachea normal. Carotid bruit is not present. No thyroid mass and no thyromegaly present.  Cardiovascular: Normal rate, regular rhythm and normal pulses.  Exam reveals no gallop, no distant heart sounds and no friction rub.   No murmur heard. Left 2 plus nonpitting edema... Firm skin? lymphedema  Pulmonary/Chest: Effort normal and breath sounds normal. No respiratory distress. Chest wall is not dull to percussion. He exhibits tenderness and bony tenderness. He exhibits no mass.  Area of ttp  Abdominal: Normal appearance and bowel sounds are normal. There is no hepatosplenomegaly. There is no tenderness. There is no rebound and no CVA tenderness.  Musculoskeletal:       Thoracic back: He exhibits decreased range of motion and tenderness.       Lumbar back: He exhibits decreased range of motion and tenderness. He exhibits no bony tenderness.  Mildly positive  SLR on right but difficult exam due to habitus   Skin: Skin is warm, dry and intact. No rash noted.  Psychiatric: He has a normal mood and affect. His speech is normal and behavior is normal. Thought content normal.          Assessment & Plan:

## 2015-09-01 NOTE — Assessment & Plan Note (Signed)
Well controlled. Continue current medication.  

## 2015-09-01 NOTE — Assessment & Plan Note (Signed)
Well controlled on current med.

## 2015-09-01 NOTE — Patient Instructions (Addendum)
Stop lab on way out. We will consider increasing victoza to max.  Consider water exercise. Call [facility] about bariatric surgery seminar.  Start diclofenac twice daily for pain and inflammation in low back. Can use muscle relaxant at night for spasm.  Continue heat and start low back stretches at home.  Call if not improving  In 2 weeks.

## 2015-09-01 NOTE — Progress Notes (Signed)
Pre visit review using our clinic review tool, if applicable. No additional management support is needed unless otherwise documented below in the visit note. 

## 2015-09-01 NOTE — Assessment & Plan Note (Signed)
Due for re-eval. 

## 2015-09-01 NOTE — Assessment & Plan Note (Signed)
No weight loss in last 6 months on victoza. Pt minimaly active. May increase victoza to max if CBG not low. Encouraged more movement and continued improvement with diet.  Recommended bariatric surgery although pt may need to lower BMI before being a candidate. Pt will go to bariatric seminar.

## 2015-09-02 ENCOUNTER — Encounter: Payer: Self-pay | Admitting: Nurse Practitioner

## 2015-09-02 ENCOUNTER — Ambulatory Visit (INDEPENDENT_AMBULATORY_CARE_PROVIDER_SITE_OTHER): Payer: 59 | Admitting: Nurse Practitioner

## 2015-09-02 VITALS — BP 180/102 | HR 101 | Ht 67.0 in | Wt >= 6400 oz

## 2015-09-02 DIAGNOSIS — R0789 Other chest pain: Secondary | ICD-10-CM

## 2015-09-02 DIAGNOSIS — I152 Hypertension secondary to endocrine disorders: Secondary | ICD-10-CM | POA: Insufficient documentation

## 2015-09-02 DIAGNOSIS — I10 Essential (primary) hypertension: Secondary | ICD-10-CM | POA: Diagnosis not present

## 2015-09-02 DIAGNOSIS — R072 Precordial pain: Secondary | ICD-10-CM | POA: Diagnosis not present

## 2015-09-02 DIAGNOSIS — E785 Hyperlipidemia, unspecified: Secondary | ICD-10-CM | POA: Diagnosis not present

## 2015-09-02 DIAGNOSIS — E1159 Type 2 diabetes mellitus with other circulatory complications: Secondary | ICD-10-CM | POA: Insufficient documentation

## 2015-09-02 LAB — HEPATITIS C ANTIBODY: HCV AB: NEGATIVE

## 2015-09-02 NOTE — Patient Instructions (Signed)
Medication Instructions:  None  Labwork: None  Testing/Procedures: None  Follow-Up: 6 months with Dr. Fletcher Anon  Any Other Special Instructions Will Be Listed Below (If Applicable).     If you need a refill on your cardiac medications before your next appointment, please call your pharmacy.

## 2015-09-02 NOTE — Progress Notes (Signed)
Office Visit    Patient Name: Blake Garcia Date of Encounter: 09/02/2015  Primary Care Provider:  Eliezer Lofts, MD Primary Cardiologist:  Jerilynn Mages. Fletcher Anon, MD   Chief Complaint    60 year old male with history of hypertension, diastolic dysfunction, chest pain, and morbid obesity, who presents for follow-up.  Past Medical History    Past Medical History  Diagnosis Date  . Depression   . Hyperlipidemia   . Hypertension   . Kidney stones   . Complication of anesthesia ~ 2000    "during OR for kidney stones; anesthesia RX made me code twice in one week"  . Cellulitis 02/09/2012    RLE  . OSA (obstructive sleep apnea)     "wear CPAP"  . History of blood transfusion 1958    "I was an Rh baby"  . Basal cell carcinoma [id number]    [place]  . Personal history of colonic adenoma 09/14/2012  . Morbid obesity ([place])   . Midsternal chest pain   . Diastolic dysfunction     a. 11/2014 Echo: EF 50-55%, Gr 1 DD.   Past Surgical History  Procedure Laterality Date  . Lithotripsy  2000  . Esophagogastroduodenoscopy    . Knee arthroscopy  ~ 2004    right  . Cholecystectomy  1993    Allergies  No Known Allergies  History of Present Illness    60 year old male with the above complex past medical history. He has a long history of morbid obesity with associated hypertension and diastolic dysfunction. He was previously evaluated secondary to chest pain and dyspnea with an echocardiogram showing normal LV function in 2016. He has not undergone ischemic evaluation as his size is likely to be prohibitive to getting good imaging and a reliable study. Since his initial evaluation the spring of 2016, he has not had any recurrence of chest pain. He does have chronic dyspnea on exertion in the setting of morbid obesity and deconditioning and together, this limits his activity. He has been followed closely by primary care and is very interested in losing weight. He is beginning to look into  options for surgical weight loss. He has tried to change his eating habits but has not seen significant weight loss.  He denies PND, orthopnea, dizziness, syncope, edema, palpitations, or early satiety.  Home Medications    Prior to Admission medications   Medication Sig Start Date End Date Taking? Authorizing Provider  amLODipine (NORVASC) 10 MG tablet TAKE 1 TABLET BY MOUTH EVERY DAY 12/26/14  Yes Amy Cletis [place], MD  aspirin EC 81 MG tablet Take 81 mg by mouth daily.   Yes Historical Provider, MD  cholecalciferol (VITAMIN D) 1000 UNITS tablet Take 1,000 Units by mouth daily.   Yes Historical Provider, MD  cyclobenzaprine (FLEXERIL) 10 MG tablet Take 1 tablet (10 mg total) by mouth at bedtime as needed for muscle spasms. 09/01/15  Yes Amy Cletis [place], MD  diclofenac (VOLTAREN) 75 MG EC tablet Take 1 tablet (75 mg total) by mouth 2 (two) times daily. 09/01/15  Yes Amy Cletis [place], MD  Insulin Pen Needle (PEN NEEDLES 5/16") 30G X 8 MM MISC Use to inject insulin once daily 12/26/14  Yes Amy E Bedsole, MD  KLOR-CON M20 20 MEQ tablet TAKE 1 TABLET BY MOUTH EVERY DAY 05/23/15  Yes Amy Cletis Athens, MD  Liraglutide 18 MG/3ML SOPN Inject 0.2 mLs (1.2 mg total) into the skin at bedtime. 12/26/14  Yes Amy Cletis [place], MD  Multiple  Vitamins-Minerals (MULTIVITAMIN ADULTS 50+) TABS Take 1 tablet by mouth daily.   Yes Historical Provider, MD  simvastatin (ZOCOR) 20 MG tablet TAKE 1 TABLET (20 MG TOTAL) BY MOUTH AT BEDTIME. 12/26/14  Yes Amy E Bedsole, MD  tolterodine (DETROL LA) 4 MG 24 hr capsule Take 1 capsule (4 mg total) by mouth daily. 11/28/14  Yes Amy E Bedsole, MD  triamcinolone cream (KENALOG) 0.1 % APPLY 1 APPLICATION TOPICALLY 2 (TWO) TIMES DAILY. 04/29/15  Yes Amy E Bedsole, MD  valsartan-hydrochlorothiazide (DIOVAN-HCT) 320-25 MG tablet TAKE 1 TABLET BY MOUTH DAILY. 07/16/15  Yes Amy Cletis [place], MD  venlafaxine XR (EFFEXOR-XR) 150 MG 24 hr capsule Take 1 capsule (150 mg total) by mouth daily with breakfast.  05/29/15  Yes Amy Cletis [place], MD    Review of Systems    As above, generally doing well.  No chest pain.  Chronic DOE.  He has been having some low back pain.  He denies palpitations, pnd, orthopnea, n, v, dizziness, syncope, edema, weight gain, or early satiety.  He is using CPAP @ night.  All other systems reviewed and are otherwise negative except as noted above.  Physical Exam    VS:  BP 148/90 mmHg  Pulse 101  Ht 5\' 7"  (1.702 m)  Wt 473 lb 6.4 oz (214.733 kg)  BMI 74.13 kg/m2 , BMI Body mass index is 74.13 kg/(m^2). GEN: Obese, in no acute distress. HEENT: normal. Neck: Obese, difficult to assess JVP. No carotid bruits, or masses. Cardiac: RRR, no murmurs, rubs, or gallops. No clubbing, cyanosis, 1+ bilateral lower extremity edema.  Radials/DP/PT 2+ and equal bilaterally.  Respiratory:  Respirations regular and unlabored, clear to auscultation bilaterally. GI: Soft, nontender, nondistended, BS + x 4. MS: no deformity or atrophy. Skin: warm and dry, no rash. Neuro:  Strength and sensation are intact. Psych: Normal affect.  Accessory Clinical Findings    None  Assessment & Plan    1.  Midsternal chest pain/dyspnea on exertion: Patient has a prior history of midsternal chest discomfort. His size has prevented [name] from performing a full ischemic evaluation. Fortunately, chest pain has resolved over the past year. He does have chronic dyspnea on exertion in the setting of ongoing obesity and deconditioning. Echocardiogram in May 2016 showed normal LV function. He does not require any further ischemic evaluation at this time.   2. Essential hypertension: Blood pressure has been relatively stable. When he initially walked in and sat down, his pressure was 180/102, but on repeat, he came down to 148/90. I have reviewed his blood pressure trend over the past few years. Over the past year, he has mostly been in the 130s. I will make no changes today. He remains on amlodipine and  valsartan-HCTZ.  3. Morbid obesity: Patient is very interested in losing weight. He has young children and wanting to be around for their graduations motivates him. Unfortunate, he is very sedentary and deconditioned, which results in him feeling like he cannot exercise at all. We discussed calorie counting type applications for his smart phone to help him realize what his caloric intake actually is and assist in cutting back in losing weight. Have also encouraged him to try and increase his activity even if that results in shortness of breath after only 3 minutes. He was responsive to his conversation and does seem to want to make changes.  4. Prediabetes: This is followed closely by primary care. He is on Victoza.  5. Hyperlipidemia: He is on simvastatin 20  and his LDL was 91 yesterday. Normal LFTs.  6. Disposition: Follow-up with Dr. Fletcher Anon in 6 months or sooner if necessary.  Murray Hodgkins, NP 09/02/2015, 12:10 PM

## 2015-09-13 ENCOUNTER — Other Ambulatory Visit: Payer: Self-pay | Admitting: Family Medicine

## 2015-09-13 NOTE — Telephone Encounter (Signed)
Last office visit 09/01/2015.  Last refilled 09/01/2015.  Ok to refill?

## 2015-09-19 ENCOUNTER — Other Ambulatory Visit: Payer: Self-pay | Admitting: Family Medicine

## 2015-10-14 ENCOUNTER — Other Ambulatory Visit: Payer: Self-pay | Admitting: Family Medicine

## 2015-10-30 ENCOUNTER — Ambulatory Visit: Payer: 59 | Admitting: Family Medicine

## 2015-10-30 ENCOUNTER — Telehealth: Payer: Self-pay | Admitting: Family Medicine

## 2015-10-30 NOTE — Telephone Encounter (Signed)
Patient did not come for their scheduled appointment today 2 month follow up  Please let me know if the patient needs to be contacted immediately for follow up or if no follow up is necessary.

## 2015-11-11 ENCOUNTER — Other Ambulatory Visit: Payer: Self-pay | Admitting: Family Medicine

## 2015-12-16 ENCOUNTER — Telehealth: Payer: Self-pay | Admitting: *Deleted

## 2015-12-16 NOTE — Telephone Encounter (Signed)
Last office visit 09/01/2015.  [facility] states to follow up in 2 months for CPE.  No future appointments scheduled.  Refill?

## 2015-12-17 ENCOUNTER — Other Ambulatory Visit: Payer: Self-pay | Admitting: Family Medicine

## 2015-12-17 MED ORDER — LIRAGLUTIDE 18 MG/3ML ~~LOC~~ SOPN: 1.2000 mg | PEN_INJECTOR | Freq: Every day | SUBCUTANEOUS | Status: DC

## 2015-12-17 MED ORDER — TOLTERODINE TARTRATE ER 4 MG PO CP24: 4.0000 mg | ORAL_CAPSULE | Freq: Every day | ORAL | Status: DC

## 2015-12-17 NOTE — Telephone Encounter (Signed)
Please call and schedule CPE with fasting labs prior for Dr. Bedsole.  

## 2015-12-17 NOTE — Telephone Encounter (Signed)
Call pt to schedule follow up as previously requested. Refilled med.

## 2015-12-18 ENCOUNTER — Other Ambulatory Visit: Payer: Self-pay | Admitting: Family Medicine

## 2015-12-23 NOTE — Telephone Encounter (Signed)
Lab 5/31 Cpx 6/5 Pt aware

## 2015-12-30 ENCOUNTER — Telehealth: Payer: Self-pay | Admitting: Family Medicine

## 2015-12-30 ENCOUNTER — Other Ambulatory Visit (INDEPENDENT_AMBULATORY_CARE_PROVIDER_SITE_OTHER): Payer: 59

## 2015-12-30 DIAGNOSIS — R7309 Other abnormal glucose: Secondary | ICD-10-CM | POA: Diagnosis not present

## 2015-12-30 DIAGNOSIS — E785 Hyperlipidemia, unspecified: Secondary | ICD-10-CM

## 2015-12-30 DIAGNOSIS — Z125 Encounter for screening for malignant neoplasm of prostate: Secondary | ICD-10-CM

## 2015-12-30 LAB — LIPID PANEL
CHOLESTEROL: 171 mg/dL (ref 0–200)
HDL: 54.8 mg/dL (ref >39.00)
LDL Cholesterol: 96 mg/dL (ref 0–99)
NonHDL: 116.08
TRIGLYCERIDES: 101 mg/dL (ref 0.0–149.0)
Total CHOL/HDL Ratio: 3
VLDL: 20.2 mg/dL (ref 0.0–40.0)

## 2015-12-30 LAB — COMPREHENSIVE METABOLIC PANEL
ALBUMIN: 4.2 g/dL (ref 3.5–5.2)
ALK PHOS: 77 U/L (ref 39–117)
ALT: 19 U/L (ref 0–53)
AST: 16 U/L (ref 0–37)
BILIRUBIN TOTAL: 0.5 mg/dL (ref 0.2–1.2)
BUN: 16 mg/dL (ref 6–23)
CALCIUM: 9.7 mg/dL (ref 8.4–10.5)
CO2: 33 mEq/L — ABNORMAL HIGH (ref 19–32)
CREATININE: 0.96 mg/dL (ref 0.40–1.50)
Chloride: 100 mEq/L (ref 96–112)
GFR: 85.14 mL/min (ref >60.00)
Glucose, Bld: 105 mg/dL — ABNORMAL HIGH (ref 70–99)
Potassium: 4.1 mEq/L (ref 3.5–5.1)
Sodium: 139 mEq/L (ref 135–145)
Total Protein: 6.9 g/dL (ref 6.0–8.3)

## 2015-12-30 LAB — PSA: PSA: 1.71 ng/mL (ref 0.10–4.00)

## 2015-12-30 LAB — HEMOGLOBIN A1C: HEMOGLOBIN A1C: 6 % (ref 4.6–6.5)

## 2015-12-30 NOTE — Telephone Encounter (Signed)
-----   Message from Marchia Bond sent at 12/29/2015  3:54 PM EDT ----- Regarding: Cpx labs tomorrow, need orders. Thanks! :-) Please order  future cpx labs for pt's upcoming lab appt. Thanks Aniceto Boss

## 2016-01-04 ENCOUNTER — Encounter: Payer: Self-pay | Admitting: Family Medicine

## 2016-01-04 ENCOUNTER — Encounter: Payer: 59 | Admitting: Family Medicine

## 2016-01-04 ENCOUNTER — Ambulatory Visit (INDEPENDENT_AMBULATORY_CARE_PROVIDER_SITE_OTHER): Payer: 59 | Admitting: Family Medicine

## 2016-01-04 VITALS — BP 156/88 | HR 100 | Temp 98.4°F | Ht 67.0 in | Wt >= 6400 oz

## 2016-01-04 DIAGNOSIS — R7309 Other abnormal glucose: Secondary | ICD-10-CM

## 2016-01-04 DIAGNOSIS — I10 Essential (primary) hypertension: Secondary | ICD-10-CM

## 2016-01-04 DIAGNOSIS — R0609 Other forms of dyspnea: Secondary | ICD-10-CM

## 2016-01-04 DIAGNOSIS — I5032 Chronic diastolic (congestive) heart failure: Secondary | ICD-10-CM

## 2016-01-04 DIAGNOSIS — G4733 Obstructive sleep apnea (adult) (pediatric): Secondary | ICD-10-CM

## 2016-01-04 DIAGNOSIS — E785 Hyperlipidemia, unspecified: Secondary | ICD-10-CM | POA: Diagnosis not present

## 2016-01-04 DIAGNOSIS — Z0000 Encounter for general adult medical examination without abnormal findings: Secondary | ICD-10-CM | POA: Diagnosis not present

## 2016-01-04 DIAGNOSIS — Z6841 Body Mass Index (BMI) 40.0 and over, adult: Secondary | ICD-10-CM

## 2016-01-04 DIAGNOSIS — Z23 Encounter for immunization: Secondary | ICD-10-CM | POA: Diagnosis not present

## 2016-01-04 NOTE — Assessment & Plan Note (Signed)
On CPAP, stable.

## 2016-01-04 NOTE — Assessment & Plan Note (Signed)
Secondary to morbid obesity.

## 2016-01-04 NOTE — Patient Instructions (Addendum)
Increase by 0.6 mg daily at weekly intervals to a target dose of 3 mg once daily.  Go to weight loss seminar as previously discussed.  Work on increasing mobility. Schedule colonoscopy ASAP.

## 2016-01-04 NOTE — Assessment & Plan Note (Signed)
Increase victoza 0.6 weekly till at max 3 mg daily. If no weight loss by 16 weeks out... Stop.

## 2016-01-04 NOTE — Progress Notes (Signed)
The patient is here for annual wellness exam and preventative care.  He also has the following chronic health issues.  Morbid obesity:  Causing SOb with exertion, using cane to walk.. Due to left knee pain. He slipped in bathtub 1 month ago, hit knee, pain getting better than it was. Still with swelling in both legs. Body mass index is 74.22 kg/(m^2).  Working on healthy eating.  Exercise is limited by shortness breath and left knee pain. Wt Readings from Last 3 Encounters:  01/04/16 474 lb (215.005 kg)  09/02/15 473 lb 6.4 oz (214.733 kg)  09/01/15 474 lb 12 oz (215.345 kg)   Elevated Cholesterol: Well controlled on simvastatin 40 mg daily Lab Results  Component Value Date   CHOL 171 12/30/2015   HDL 54.80 12/30/2015   LDLCALC 96 12/30/2015   LDLDIRECT 153.9 09/10/2007   TRIG 101.0 12/30/2015   CHOLHDL 3 12/30/2015  Using medications without problems:None Muscle aches: None Diet compliance: he is doing much better with this, no fast food, eating salads. No longer eating late. Exercise: unable as above..  Other complaints:  Hypertension:  Borderline control on amlodipine, diovan HCT. drove in from [place].. Was stressful drive with the rain, running late. BP Readings from Last 3 Encounters:  01/04/16 156/88  09/02/15 180/102  09/01/15 138/82  Using medication without problems or lightheadedness: None Chest pain with exertion:None Edema: bilteral Short of breath: See above Average home BPs: 137/88 Other issues:  Prediabetes: Stable on victoza 1.8 for weight loss.[name] Has not been effective. No SE. Lab Results  Component Value Date   HGBA1C 6.0 12/30/2015   Depression: Moderate control on effexor. No SI. Under a lot of stress. Does feel very frustrated about his decrease in mobility.  Social History /Family History/Past Medical History reviewed and updated if needed.   Review of Systems  Constitutional: Negative for fever, fatigue and unexpected weight change.   HENT: Negative for ear pain, congestion, sore throat, rhinorrhea, trouble swallowing and postnasal drip.  Eyes: Negative for pain.  Respiratory: Positive for shortness of breath. Negative for cough and wheezing.  Cardiovascular: Negative for chest pain, palpitations and leg swelling.  Gastrointestinal: Negative for nausea, abdominal pain, diarrhea, constipation and blood in stool.  Genitourinary: Negative for dysuria, urgency, hematuria, discharge, penile swelling, scrotal swelling, difficulty urinating, penile pain and testicular pain.  Skin: Negative for rash.  Neurological: Negative for syncope, weakness, light-headedness, numbness and headaches.  Psychiatric/Behavioral: Negative for behavioral problems and dysphoric mood. The patient is not nervous/anxious.       Objective:   Physical Exam  Constitutional: He appears well-developed and well-nourished. Non-toxic appearance. He does not appear ill. No distress.  Morbid obesity  HENT:  Head: Normocephalic and atraumatic.  Right Ear: Hearing, tympanic membrane, external ear and ear canal normal.  Left Ear: Hearing, tympanic membrane, external ear and ear canal normal.  Nose: Nose normal.  Mouth/Throat: Uvula is midline, oropharynx is clear and moist and mucous membranes are normal.  Small oropharynx  Eyes: Conjunctivae, EOM and lids are normal. Pupils are equal, round, and reactive to light. No foreign bodies found.  Neck: Trachea normal, normal range of motion and phonation normal. Neck supple. Carotid bruit is not present. No mass and no thyromegaly present.  Short neck  Cardiovascular: Normal rate, regular rhythm, S1 normal, S2 normal, intact distal pulses and normal pulses. Exam reveals no gallop.  No murmur heard.  Bilateral pitting edema , left > right, chronic venous changes of bilateral legs. Pulmonary/Chest: Breath sounds  normal. He has no wheezes. He has no rhonchi. He has no rales.  Abdominal: Soft. Normal  appearance and bowel sounds are normal. There is no hepatosplenomegaly. There is no tenderness. There is no rebound, no guarding and no CVA tenderness. No hernia.  Genitourinary: Prostate is not tender.  Lymphadenopathy:   He has no cervical adenopathy.  Neurological: He is alert. He has normal strength and normal reflexes. No cranial nerve deficit or sensory deficit. Gait normal.  Skin: Skin is warm, dry and intact. No rash noted.  Psychiatric: He has a normal mood and affect. His speech is normal and behavior is normal. Judgment normal.   Assessment & Plan:  The patient's preventative maintenance and recommended screening tests for an annual wellness exam were reviewed in full today. Brought up to date unless services declined.  Counselled on the importance of diet, exercise, and its role in overall health and mortality. The patient's FH and SH was reviewed, including their home life, tobacco status, and drug and alcohol status.   Vaccines: due for tdap Colon: 2009 High grade adenoma Dr. Carlean Purl, repeat in 5 years. PSA: Stable, no further elevation.  Lab Results  Component Value Date   PSA 1.71 12/30/2015   PSA 1.90 09/01/2015   PSA 2.45 06/20/2014  Nonsmoker Hep C: done

## 2016-01-04 NOTE — Addendum Note (Signed)
Addended by: Carter Kitten on: 01/04/2016 02:59 PM   Modules accepted: Orders, SmartSet

## 2016-01-04 NOTE — Assessment & Plan Note (Signed)
Well controlled. Continue current medication.  

## 2016-01-04 NOTE — Progress Notes (Signed)
Pre visit review using our clinic review tool, if applicable. No additional management support is needed unless otherwise documented below in the visit note. 

## 2016-01-04 NOTE — Assessment & Plan Note (Signed)
Work on low [employer], healthy eating and regular exercise.

## 2016-01-04 NOTE — Assessment & Plan Note (Signed)
Stable.. Does have some peripheral swelling.. If continuing consider changing HCTZ to lasix daily.

## 2016-01-04 NOTE — Assessment & Plan Note (Signed)
Stable control on current meds. 

## 2016-02-08 ENCOUNTER — Other Ambulatory Visit: Payer: Self-pay | Admitting: *Deleted

## 2016-02-08 MED ORDER — VENLAFAXINE HCL ER 150 MG PO CP24: 150.0000 mg | ORAL_CAPSULE | Freq: Every day | ORAL | Status: DC

## 2016-03-16 ENCOUNTER — Other Ambulatory Visit: Payer: Self-pay | Admitting: Family Medicine

## 2016-03-20 ENCOUNTER — Other Ambulatory Visit: Payer: Self-pay | Admitting: Family Medicine

## 2016-03-22 ENCOUNTER — Other Ambulatory Visit: Payer: Self-pay | Admitting: Family Medicine

## 2016-03-22 ENCOUNTER — Telehealth: Payer: Self-pay

## 2016-03-22 NOTE — Telephone Encounter (Signed)
Pt left v/m; pt is taking max dose of Victoza and pt request new rx sent to [place] reflecting new instructions of pt taking max dose. Last annual exam 01/04/16 and noted may increase victoza until reach max dose of 3 mg daily.Please advise.

## 2016-03-22 NOTE — Telephone Encounter (Signed)
Okay to refill as requested.

## 2016-03-23 ENCOUNTER — Telehealth: Payer: Self-pay | Admitting: *Deleted

## 2016-03-23 NOTE — Telephone Encounter (Signed)
Refills sent as requested

## 2016-03-23 NOTE — Telephone Encounter (Signed)
[facility] requesting [facility] for Victoza.  PA completed on CoverMyMeds.  PA approved.  [facility] notified of approval via fax.

## 2016-03-24 ENCOUNTER — Other Ambulatory Visit: Payer: Self-pay | Admitting: Family Medicine

## 2016-04-12 ENCOUNTER — Ambulatory Visit: Payer: 59 | Admitting: Family Medicine

## 2016-04-19 ENCOUNTER — Encounter: Payer: Self-pay | Admitting: Family Medicine

## 2016-04-19 ENCOUNTER — Ambulatory Visit (INDEPENDENT_AMBULATORY_CARE_PROVIDER_SITE_OTHER): Payer: 59 | Admitting: Family Medicine

## 2016-04-19 VITALS — BP 138/86 | HR 107 | Temp 98.5°F | Ht 67.0 in | Wt >= 6400 oz

## 2016-04-19 DIAGNOSIS — Z6841 Body Mass Index (BMI) 40.0 and over, adult: Secondary | ICD-10-CM | POA: Diagnosis not present

## 2016-04-19 DIAGNOSIS — R7309 Other abnormal glucose: Secondary | ICD-10-CM | POA: Diagnosis not present

## 2016-04-19 DIAGNOSIS — I10 Essential (primary) hypertension: Secondary | ICD-10-CM | POA: Diagnosis not present

## 2016-04-19 NOTE — Progress Notes (Signed)
Pre visit review using our clinic review tool, if applicable. No additional management support is needed unless otherwise documented below in the visit note. 

## 2016-04-19 NOTE — Patient Instructions (Addendum)
Continue victoza at 1.8 mg daily.  Continue  Work on low carb low fat diet, increase activity.   Try to avoid fast food.

## 2016-04-19 NOTE — Assessment & Plan Note (Signed)
Improving weight loss with victoza at 1.8 mcg. Given no SE and weight loss continuing.. Continue current dose. Add more activity as tolerated and decrease fast food.,

## 2016-04-19 NOTE — Progress Notes (Signed)
   Subjective:    Patient ID: Blake Garcia, male    DOB: [birth year], 60 y.o.   MRN: [id number]  HPI  60 year old male with morbid obesity with complicaitons  Including prediabetespt presents for weight management.  He is on victoza 1.8 mg daily.  He feel that this helps with satiety.  He has been decreasing portion size. Still limited on exercise with knee issues. He has lost 20 lbs in the last 3 months.  No nausea, no diarrhea. No low CBGs. He has been under a lot of stress, mother passed away on April 05, 2016 was elderly.  Wt Readings from Last 3 Encounters:  04/19/16 (!) 455 lb 4 oz (206.5 kg)  01/04/16 (!) 474 lb (215 kg)  09/02/15 (!) 473 lb 6.4 oz (214.7 kg)   Body mass index is 71.3 kg/m.   Improved control. BP Readings from Last 3 Encounters:  04/19/16 138/86  01/04/16 (!) 156/88  09/02/15 (!) 180/102      Review of Systems  Constitutional: Negative for fatigue.  HENT: Negative for ear pain.   Eyes: Negative for pain.  Respiratory: Negative for shortness of breath.   Cardiovascular: Negative for chest pain.       Objective:   Physical Exam  Constitutional: He is oriented to person, place, and time. Vital signs are normal. He appears well-developed and well-nourished.  Morbid obesity  HENT:  Head: Normocephalic.  Right Ear: Hearing normal.  Left Ear: Hearing normal.  Nose: Nose normal.  Mouth/Throat: Oropharynx is clear and moist and mucous membranes are normal.  Neck: Trachea normal. Carotid bruit is not present. No thyroid mass and no thyromegaly present.  Cardiovascular: Normal rate, regular rhythm and normal pulses.  Exam reveals no gallop, no distant heart sounds and no friction rub.   No murmur heard. No peripheral edema  Pulmonary/Chest: Effort normal and breath sounds normal. No respiratory distress.  Neurological: He is alert and oriented to person, place, and time.  Skin: Skin is warm, dry and intact. No rash noted.  Psychiatric: He has a  normal mood and affect. His speech is normal and behavior is normal. Thought content normal.          Assessment & Plan:

## 2016-04-19 NOTE — Assessment & Plan Note (Signed)
Improved control with weight loss and not rushing to appointment.

## 2016-07-01 ENCOUNTER — Encounter: Payer: Self-pay | Admitting: Family Medicine

## 2016-07-01 ENCOUNTER — Ambulatory Visit (INDEPENDENT_AMBULATORY_CARE_PROVIDER_SITE_OTHER): Payer: 59 | Admitting: Family Medicine

## 2016-07-01 DIAGNOSIS — M79605 Pain in left leg: Principal | ICD-10-CM

## 2016-07-01 DIAGNOSIS — M545 Low back pain, unspecified: Secondary | ICD-10-CM

## 2016-07-01 MED ORDER — PREDNISONE 20 MG PO TABS: ORAL_TABLET | ORAL | 0 refills | Status: DC

## 2016-07-01 NOTE — Patient Instructions (Signed)
Complete steroid course.  Heat on low back, start home physical therapy.  Follow up in 2 weeks if not improving.

## 2016-07-01 NOTE — Assessment & Plan Note (Signed)
No indication for X-ray .  Treat with pred taper, heat and home PT.  Follow up if not improving in 2 weeks.

## 2016-07-01 NOTE — Progress Notes (Signed)
Pre visit review using our clinic review tool, if applicable. No additional management support is needed unless otherwise documented below in the visit note. 

## 2016-07-01 NOTE — Progress Notes (Signed)
   Subjective:    Patient ID: Blake Garcia, male    DOB: [birth year], 60 y.o.   MRN: [id number]  HPI  60 year old  Morbidly obese male presents with new onset pain in left leg pain.  He has noted several months of tingling, numbness and pain in left leg. Pain starts in low back and buttock to left foot.  Ache keeps him awake at night.  When walking leg weak. Trouble lifting foot up.   Tingling and numbness in foot and ankle. 7/10 on pain scale.  no rash or redness in left leg.  no fever.  Ibuprofen 600 mg at bed time.[name] Helps some. Has tried heat.  No prior injury, fall.. Did fall 1 week ago when lost balance with left leg weak.. Landed on back.. bruising on left upper arm and shoulder, no new pain after that.  No past back surgery.  Wt Readings from Last 3 Encounters:  07/01/16 (!) 458 lb 4 oz (207.9 kg)  04/19/16 (!) 455 lb 4 oz (206.5 kg)  01/04/16 (!) 474 lb (215 kg)   Some concern that he may have bipolar disorder. Mood disorder questionairre completed   Review of Systems  Constitutional: Positive for fatigue.  HENT: Negative for ear pain.   Eyes: Negative for pain.  Respiratory: Positive for shortness of breath. Negative for cough and wheezing.   Cardiovascular: Positive for leg swelling. Negative for chest pain and palpitations.       Objective:   Physical Exam  Constitutional: Vital signs are normal. He appears well-developed and well-nourished.  Morbid obesity  HENT:  Head: Normocephalic.  Right Ear: Hearing normal.  Left Ear: Hearing normal.  Nose: Nose normal.  Mouth/Throat: Oropharynx is clear and moist and mucous membranes are normal.  Neck: Trachea normal. Carotid bruit is not present. No thyroid mass and no thyromegaly present.  Cardiovascular: Normal rate, regular rhythm and normal pulses.  Exam reveals no gallop, no distant heart sounds and no friction rub.   No murmur heard. bilateral peripheral edema, stable, right greater than left    Pulmonary/Chest: Effort normal and breath sounds normal. No respiratory distress.  Musculoskeletal:       Right shoulder: He exhibits decreased range of motion and bony tenderness.  ttp in left sciatic notch  positive SLR  Neurological: He has normal strength. No cranial nerve deficit or sensory deficit. He exhibits normal muscle tone. Gait abnormal.  Skin: Skin is warm, dry and intact. No rash noted.  Psychiatric: He has a normal mood and affect. His speech is normal and behavior is normal. Thought content normal.          Assessment & Plan:

## 2016-07-05 ENCOUNTER — Telehealth: Payer: Self-pay | Admitting: Family Medicine

## 2016-07-05 DIAGNOSIS — F319 Bipolar disorder, unspecified: Secondary | ICD-10-CM

## 2016-07-05 NOTE — Telephone Encounter (Signed)
Left message for Mr. Buening that Dr. Diona Browner has sent the referral as requested.

## 2016-07-05 NOTE — Telephone Encounter (Signed)
Let pt know referral sent. 

## 2016-07-19 ENCOUNTER — Encounter: Payer: Self-pay | Admitting: Family Medicine

## 2016-07-19 ENCOUNTER — Ambulatory Visit (INDEPENDENT_AMBULATORY_CARE_PROVIDER_SITE_OTHER): Payer: 59 | Admitting: Family Medicine

## 2016-07-19 DIAGNOSIS — Z6841 Body Mass Index (BMI) 40.0 and over, adult: Secondary | ICD-10-CM

## 2016-07-19 DIAGNOSIS — M545 Low back pain: Secondary | ICD-10-CM | POA: Diagnosis not present

## 2016-07-19 DIAGNOSIS — M79605 Pain in left leg: Secondary | ICD-10-CM

## 2016-07-19 NOTE — Progress Notes (Signed)
   Subjective:    Patient ID: Blake Garcia, male    DOB: 10-29-1955, 60 y.o.   MRN: [id number]  HPI    60 year old morbidly obese male presents for follow up on left sided lumbar radiculopathy causing left leg pain.  s/p prednisone  6 day taper Started home PT.   Today pt reports prednisone taper did not help at all.  He continues to have pain in left leg, sore to touch in left .  he has tried to do home PT.. May have help low back pain some.  No new weakness and numbness.  Pain is worse with walking . 6/10 on pain scale. Ibuprofen  600 mg helps him sleep at night. In AM also takes additional 600 mg x 1. Diclofenac and muscle relaxant did not help much.  He has some continued weight loss on victoza 1.8 mcg daily. Wt Readings from Last 3 Encounters:  07/19/16 (!) 453 lb 12 oz (205.8 kg)  07/01/16 (!) 458 lb 4 oz (207.9 kg)  04/19/16 (!) 455 lb 4 oz (206.5 kg)   Body mass index is 71.07 kg/m.   Review of Systems  Constitutional: Negative for fatigue, fever and unexpected weight change.  HENT: Negative for congestion, ear pain, postnasal drip, rhinorrhea, sore throat and trouble swallowing.   Eyes: Negative for pain.  Respiratory: Negative for cough, shortness of breath and wheezing.   Cardiovascular: Negative for chest pain.  Gastrointestinal: Negative for abdominal pain, blood in stool, constipation, diarrhea and nausea.  Genitourinary: Negative for difficulty urinating, discharge, dysuria, hematuria, penile pain, penile swelling, scrotal swelling, testicular pain and urgency.  Skin: Negative for rash.  Neurological: Negative for syncope, weakness, light-headedness, numbness and headaches.  Psychiatric/Behavioral: Negative for behavioral problems and dysphoric mood. The patient is not nervous/anxious.        Objective:   Physical Exam  Constitutional: Vital signs are normal. He appears well-developed and well-nourished.  Morbid obesity  HENT:  Head: Normocephalic.    Right Ear: Hearing normal.  Left Ear: Hearing normal.  Nose: Nose normal.  Mouth/Throat: Oropharynx is clear and moist and mucous membranes are normal.  Neck: Trachea normal. Carotid bruit is not present. No thyroid mass and no thyromegaly present.  Cardiovascular: Normal rate, regular rhythm and normal pulses.  Exam reveals no gallop, no distant heart sounds and no friction rub.   No murmur heard. bilateral peripheral edema, stable, right greater than left  Pulmonary/Chest: Effort normal and breath sounds normal. No respiratory distress.  Musculoskeletal:       Right shoulder: He exhibits decreased range of motion and bony tenderness.  ttp in left sciatic notch  positive SLR  Neurological: He has normal strength. No cranial nerve deficit or sensory deficit. He exhibits normal muscle tone. Gait abnormal.  Skin: Skin is warm, dry and intact. No rash noted.  Psychiatric: He has a normal mood and affect. His speech is normal and behavior is normal. Thought content normal.          Assessment & Plan:

## 2016-07-19 NOTE — Patient Instructions (Addendum)
Stop at  front desk for referral for outside X-ray for further evaluation.  Continue ibuprofen 600 mg every 6 hours as needed. Continue home physical therapy. Continue working on healthy eating and regular exercise as tolerated.

## 2016-07-19 NOTE — Assessment & Plan Note (Signed)
Continued weight loss Encouraged exercise, weight loss, healthy eating habits.

## 2016-07-19 NOTE — Progress Notes (Signed)
Pre visit review using our clinic review tool, if applicable. No additional management support is needed unless otherwise documented below in the visit note. 

## 2016-07-19 NOTE — Assessment & Plan Note (Signed)
Will eval with X-ray.  Pt not interested in formal therapy at this time.. Continue home PT. Use ibuprofen as needed.

## 2016-08-11 ENCOUNTER — Encounter (HOSPITAL_COMMUNITY): Payer: Self-pay

## 2016-08-16 ENCOUNTER — Ambulatory Visit (INDEPENDENT_AMBULATORY_CARE_PROVIDER_SITE_OTHER): Payer: 59 | Admitting: Family Medicine

## 2016-08-16 ENCOUNTER — Encounter: Payer: Self-pay | Admitting: Family Medicine

## 2016-08-16 DIAGNOSIS — F3132 Bipolar disorder, current episode depressed, moderate: Secondary | ICD-10-CM

## 2016-08-16 DIAGNOSIS — F332 Major depressive disorder, recurrent severe without psychotic features: Secondary | ICD-10-CM

## 2016-08-16 MED ORDER — QUETIAPINE FUMARATE 50 MG PO TABS
50.0000 mg | ORAL_TABLET | Freq: Every day | ORAL | 3 refills | Status: DC
Start: 2016-08-16 — End: 2016-09-21

## 2016-08-16 NOTE — Progress Notes (Signed)
Pre visit review using our clinic review tool, if applicable. No additional management support is needed unless otherwise documented below in the visit note. 

## 2016-08-16 NOTE — Progress Notes (Signed)
   Subjective:    Patient ID: Blake Garcia, male    DOB: 12/26/1955, 61 y.o.   MRN: [id number]  HPI    61 year old male presents for multiple issues including left sided lumbar radiculopathy follow up.  At last OV he never got X-ray done. He reports back and leg pain has completely resolved now.  No pain, no weakness.  He has appt on 09/13/2016 with psychiatrist ( Dr. Einar Grad) for depression and possible biploar disorder.  Positive mood disorder questionnaire.  He feels depressed, overwhelmed. He feels like he has failed overall, excessive guilt. He feels like mood swings are frequent.. Sometimes feels high, great on top of world then bottoms out. He has been on venlafaxine 150 mg daily.. Minimal help.   Wellbutrin become ineffective in past.  Mother passed away  In 22-May-2016. Holidays alone were difficult. Not able to see brothers.   He has a lot of issues staying focused at work.  Cannot form job functions due to mood.  He has trouble communicating to customers, new hires and coworkers given irritability. Has issues with  performing  scheduling, getting task done given focus issue, fatigue, not able to set priorities.  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 This affects his mood as well. He cannot stand longer the 4 hours. Needs to rest usually after 45 min.  Has gained weight despite victoza. Overeating. Wt Readings from Last 3 Encounters:  08/16/16 (!) 462 lb (209.6 kg)  07/19/16 (!) 453 lb 12 oz (205.8 kg)  07/01/16 (!) 458 lb 4 oz (207.9 kg)          Review of Systems  Constitutional: Positive for fatigue.  HENT: Negative for ear pain.   Eyes: Negative for pain.  Respiratory: Negative for shortness of breath.   Cardiovascular: Negative for chest pain.  Psychiatric/Behavioral: Positive for behavioral problems, decreased concentration, dysphoric mood and sleep disturbance. Negative for  self-injury. The patient is nervous/anxious.        Objective:   Physical Exam  Constitutional: Vital signs are normal. He appears well-developed and well-nourished.  Morbid obesity  HENT:  Head: Normocephalic.  Right Ear: Hearing normal.  Left Ear: Hearing normal.  Nose: Nose normal.  Mouth/Throat: Oropharynx is clear and moist and mucous membranes are normal.  Neck: Trachea normal. Carotid bruit is not present. No thyroid mass and no thyromegaly present.  Cardiovascular: Normal rate, regular rhythm and normal pulses.  Exam reveals no gallop, no distant heart sounds and no friction rub.   No murmur heard. No peripheral edema  Pulmonary/Chest: Effort normal and breath sounds normal. No respiratory distress.  Skin: Skin is warm, dry and intact. No rash noted.  Psychiatric: His speech is normal. Thought content normal. His mood appears not anxious. His affect is blunt. He is slowed and withdrawn. Cognition and memory are not impaired. He does not express impulsivity. He exhibits a depressed mood. He expresses no homicidal and no suicidal ideation. He expresses no suicidal plans and no homicidal plans.          Assessment & Plan:

## 2016-08-16 NOTE — Assessment & Plan Note (Signed)
Possibility of bipolar disirder.  Add seroquel as mood stabilizer and depression treatmnt adjunct.[name] Until appt to see psychiatrist.  Info given on walk in psych office in [place] if he feels he needs to be seen sooner.

## 2016-08-16 NOTE — Assessment & Plan Note (Signed)
Needs to be evaluated by psychiatry. But mood disorder questionnaire positive.

## 2016-08-16 NOTE — Patient Instructions (Addendum)
Keep appt as scheduled with Dr. Einar Grad in February.  Add seroquel at bedtime as mood stabilizer and depression treatment adjunct. Please bring in FMLA forms for completion.. As well as summary of symptoms and expected date of leave beginning. I will complete forms until appt with psychiatry.

## 2016-09-01 ENCOUNTER — Telehealth: Payer: Self-pay | Admitting: Family Medicine

## 2016-09-01 NOTE — Telephone Encounter (Signed)
Pt dropped off FMLA paperwork In dr Diona Browner in box

## 2016-09-02 NOTE — Telephone Encounter (Signed)
Completed.

## 2016-09-05 NOTE — Telephone Encounter (Signed)
Paperwork faxed °Pt aware °Copy for pt °Copy for file °Copy for scan °

## 2016-09-11 ENCOUNTER — Other Ambulatory Visit: Payer: Self-pay | Admitting: Family Medicine

## 2016-09-12 ENCOUNTER — Other Ambulatory Visit: Payer: Self-pay | Admitting: Family Medicine

## 2016-09-13 ENCOUNTER — Ambulatory Visit: Payer: Self-pay | Admitting: Psychiatry

## 2016-09-20 ENCOUNTER — Ambulatory Visit (INDEPENDENT_AMBULATORY_CARE_PROVIDER_SITE_OTHER): Payer: 59 | Admitting: Family Medicine

## 2016-09-20 ENCOUNTER — Encounter: Payer: Self-pay | Admitting: Family Medicine

## 2016-09-20 DIAGNOSIS — F332 Major depressive disorder, recurrent severe without psychotic features: Secondary | ICD-10-CM | POA: Diagnosis not present

## 2016-09-20 DIAGNOSIS — F3132 Bipolar disorder, current episode depressed, moderate: Secondary | ICD-10-CM

## 2016-09-20 DIAGNOSIS — Z6841 Body Mass Index (BMI) 40.0 and over, adult: Secondary | ICD-10-CM | POA: Diagnosis not present

## 2016-09-20 NOTE — Assessment & Plan Note (Addendum)
On mood stabilizer.. Has appt with psychiatry in March.

## 2016-09-20 NOTE — Patient Instructions (Signed)
Keep appt at psychiatrist.  Plan return to work on 10/03/2016. Continue current medications.

## 2016-09-20 NOTE — Progress Notes (Signed)
   Subjective:    Patient ID: Blake Garcia, male    DOB: [birth year], 61 y.o.   MRN: [id number]  HPI   61 year old morbidly obese male  presents for follow up multiple issues.  He missed his appt on 2/13 as he overslept  forPsychiatry Dr. Einar Grad for depression. He is on venlafaxine 150 mg daily. He has been using seroquel as depression adjunct as well as to cover possibility that he is bipolar do. Less mood swings. No SI. He has been feeling very fatigued on this new med. He sleeps better at night except he is having very vivid dreams. Getting 7-8 hours of sleep  He is trying to get out and walk every day. He has been on leave of absence from work for his depression. He feels like he has been gaining weight given he is at home. He is ready to return to work as planned 3/5  He is using victoza 1.8 mg daily. No SE, nausea, no abdominal pain.  He has gained 30 lbs in last 2 months. Wt Readings from Last 3 Encounters:  09/20/16 (!) 486 lb (220.4 kg)  08/16/16 (!) 462 lb (209.6 kg)  07/19/16 (!) 453 lb 12 oz (205.8 kg)      Review of Systems  Constitutional: Positive for fatigue.  HENT: Positive for ear discharge. Negative for drooling, ear pain, rhinorrhea, sinus pain and sinus pressure.        Has noted decrease in hearing  He has noted a lot of wax in his ears, damp iuj ears in morning.  Eyes: Negative for pain.  Respiratory: Negative for cough and shortness of breath.   Cardiovascular: Negative for chest pain.  Gastrointestinal: Negative for abdominal pain.       Objective:   Physical Exam  Constitutional: Vital signs are normal. He appears well-developed and well-nourished.  Morbid obesity  HENT:  Head: Normocephalic.  Right Ear: Hearing normal.  Left Ear: Hearing normal.  Nose: Nose normal.  Mouth/Throat: Oropharynx is clear and moist and mucous membranes are normal.  Neck: Trachea normal. Carotid bruit is not present. No thyroid mass and no thyromegaly present.    Cardiovascular: Normal rate, regular rhythm and normal pulses.  Exam reveals no gallop, no distant heart sounds and no friction rub.   No murmur heard. bilateral peripheral edema, stable, right greater than left  Pulmonary/Chest: Effort normal and breath sounds normal. No respiratory distress.  Neurological: He has normal strength. No cranial nerve deficit or sensory deficit. He exhibits normal muscle tone. Gait abnormal.  Skin: Skin is warm, dry and intact. No rash noted.  Psychiatric: He has a normal mood and affect. His speech is normal and behavior is normal. Judgment and thought content normal. Cognition and memory are normal.          Assessment & Plan:

## 2016-09-20 NOTE — Assessment & Plan Note (Signed)
Stable mild ly improved control on seroquel and venlafaxine. Seroquel likely causing some fatigue and dreams. MAy be contributing to weight gain, but patient states he has been snacking more as well given not at work. He is cleared to return to work as planned.

## 2016-09-20 NOTE — Assessment & Plan Note (Signed)
Encouraged healthy eating and increase in exercise. Continue victoza 1.8 mg daily.. Pt not interested in increasing at this time.  Pt still considering bariatric surgery.

## 2016-09-20 NOTE — Progress Notes (Signed)
Pre visit review using our clinic review tool, if applicable. No additional management support is needed unless otherwise documented below in the visit note. 

## 2016-09-21 ENCOUNTER — Telehealth: Payer: Self-pay | Admitting: Family Medicine

## 2016-09-21 ENCOUNTER — Encounter: Payer: Self-pay | Admitting: Psychiatry

## 2016-09-21 ENCOUNTER — Ambulatory Visit (INDEPENDENT_AMBULATORY_CARE_PROVIDER_SITE_OTHER): Payer: 59 | Admitting: Psychiatry

## 2016-09-21 VITALS — BP 148/85 | HR 111 | Temp 97.5°F

## 2016-09-21 DIAGNOSIS — F331 Major depressive disorder, recurrent, moderate: Secondary | ICD-10-CM | POA: Diagnosis not present

## 2016-09-21 MED ORDER — VENLAFAXINE HCL ER 75 MG PO CP24: 225.0000 mg | ORAL_CAPSULE | Freq: Every day | ORAL | 1 refills | Status: DC

## 2016-09-21 NOTE — Telephone Encounter (Signed)
Form placed in Dr. Bedsole's in box to complete. 

## 2016-09-21 NOTE — Progress Notes (Signed)
Psychiatric Initial Adult Assessment   Patient Identification: Blake Garcia MRN:  [id number] Date of Evaluation:  09/21/2016 Referral Source: Eliezer Lofts , MD Chief Complaint:   Chief Complaint    Establish Care; Stress; Depression; attention spand     Visit Diagnosis: Major Depressive Disorder, severe    History of Present Illness:  Patient is a 61 year old Caucasian male with severe obesity, major depressive disorder, and multiple other medical conditions who presents today referred by his primary care physician for evaluation. Patient reports that he has been taking Effexor 50 mg for his depression and he is doing okay. States that he has been out of work for the last 3 weeks since Dr. Diona Browner put him on Seroquel to stabilize his mood. Reports that he is currently having a lot of trouble paying attention at work. 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 States that he has had depression for a long time and currently he does have significant trouble with concentrating, having little or no energy, overeating and feeling bad about himself. He does endorse feeling depressed and hopeless and having some trouble sleeping. He denies any suicidal thoughts. Patient has never seen a psychiatrist previously. He has never been hospitalized psychiatrically. Denies any suicide attempts. He does endorse significant anxiety and states that he worries about a lot of things. Patient denies binge eating. States that he just likes to eat since it makes him happy. States that he thinks it's better than using alcohol or any other substances. He does report that obesity runs in his family. He denies any psychotic symptoms. Denies problems with alcohol or other substances. He is not seeing a therapist currently. Patient completed a mood disorder questionnaire and did not meet criteria for bipolar disorder.  PHQ-9 of 18  Past Psychiatric History:   Previous Psychotropic  Medications: Yes Effexor, Seroquel  Substance Abuse History in the last 12 months:  No.  Consequences of Substance Abuse: Negative  Past Medical History:  Past Medical History:  Diagnosis Date  . Basal cell carcinoma [id number]   [place]  . Cellulitis 02/09/2012   RLE  . Complication of anesthesia ~ 2000   "during OR for kidney stones; anesthesia RX made me code twice in one week"  . Depression   . Diastolic dysfunction    a. 11/2014 Echo: EF 50-55%, Gr 1 DD.  [name] History of blood transfusion 1958   "I was an Rh baby"  . Hyperlipidemia   . Hypertension   . Kidney stones   . Midsternal chest pain   . Morbid obesity ([place])   . OSA (obstructive sleep apnea)    "wear CPAP"  . Personal history of colonic adenoma 09/14/2012    Past Surgical History:  Procedure Laterality Date  . CHOLECYSTECTOMY  1993  . ESOPHAGOGASTRODUODENOSCOPY    . KNEE ARTHROSCOPY  ~ 2004   right  . LITHOTRIPSY  2000    Family Psychiatric History: Denies  Family History:  Family History  Problem Relation Age of Onset  . Hypertension Mother   . Heart disease Mother   . Heart disease Father   . Stroke Father   . Heart disease Brother   . Coronary artery disease Brother   . Diabetes Brother   . Cancer Maternal Aunt     breast  . Cancer Maternal Uncle     lung  . Heart disease Brother   . Coronary artery disease Brother  Social History:   Social History   Social History  . Marital status: Single    Spouse name: N/A  . Number of children: 3  . Years of education: N/A   Occupational History  . 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 Smoking status: Never Smoker  . Smokeless tobacco: Never Used  . Alcohol use No     Comment: 02/09/12 "may have driink at party once/year"  . Drug use: No  . Sexual activity: Not Currently   Other Topics Concern  . None   Social History Narrative   Regular exercise-- walks one time a week      Diet: 2 meals-skips dinner,  cereal,salad, soda(cheerwine diet)    Additional Social History: Patient reports that he is living with a friend currently. Reports he has been divorced twice. He has 4 children and reports having a good relationship with all of them.  Allergies:  No Known Allergies  Metabolic Disorder Labs: Lab Results  Component Value Date   HGBA1C 6.0 12/30/2015   MPG 126 (H) 02/09/2012   No results found for: PROLACTIN Lab Results  Component Value Date   CHOL 171 12/30/2015   TRIG 101.0 12/30/2015   HDL 54.80 12/30/2015   CHOLHDL 3 12/30/2015   VLDL 20.2 12/30/2015   LDLCALC 96 12/30/2015   LDLCALC 91 09/01/2015     Current Medications: Current Outpatient Prescriptions  Medication Sig Dispense Refill  . amLODipine (NORVASC) 10 MG tablet TAKE 1 TABLET BY MOUTH EVERY DAY 90 tablet 1  . aspirin EC 81 MG tablet Take 81 mg by mouth daily.    . B-D ULTRAFINE III SHORT PEN 31G X 8 MM MISC USE TO INJECT INSULIN ONCE DAILY 100 each 2  . cholecalciferol (VITAMIN D) 1000 UNITS tablet Take 1,000 Units by mouth daily.    . cyclobenzaprine (FLEXERIL) 10 MG tablet TAKE 1 TABLET BY MOUTH AT BEDTIME AS NEEDED FOR MUSCLE SPASMS 15 tablet 0  . diclofenac (VOLTAREN) 75 MG EC tablet Take 75 mg by mouth 2 (two) times daily as needed.    [name] KLOR-CON M20 20 MEQ tablet TAKE 1 TABLET BY MOUTH EVERY DAY 90 tablet 1  . Liraglutide (VICTOZA) 18 MG/3ML SOPN Inject 0.3 mLs (1.8 mg total) into the skin at bedtime. 18 pen 3  . Multiple Vitamins-Minerals (MULTIVITAMIN ADULTS 50+) TABS Take 1 tablet by mouth daily.    . QUEtiapine (SEROQUEL) 50 MG tablet Take 1 tablet (50 mg total) by mouth at bedtime. 30 tablet 3  . simvastatin (ZOCOR) 20 MG tablet TAKE 1 TABLET (20 MG TOTAL) BY MOUTH AT BEDTIME. 90 tablet 0  . triamcinolone cream (KENALOG) 0.1 % APPLY 1 APPLICATION TOPICALLY 2 (TWO) TIMES DAILY. (Patient taking differently: APPLY 1 APPLICATION TOPICALLY 2 (TWO) TIMES DAILY. AS NEEDED) 30 g 0  .  valsartan-hydrochlorothiazide (DIOVAN-HCT) 320-25 MG tablet TAKE 1 TABLET BY MOUTH DAILY. 90 tablet 1  . venlafaxine XR (EFFEXOR-XR) 150 MG 24 hr capsule TAKE 1 CAPSULE (150 MG TOTAL) BY MOUTH DAILY. 90 capsule 1   No current facility-administered medications for this visit.     Neurologic: Headache: No Seizure: No Paresthesias:No  Musculoskeletal: Strength & Muscle Tone: within normal limits Gait & Station: broad based Patient leans: Front  Psychiatric Specialty Exam: ROS  There were no vitals taken for this visit.There is no height or weight on file to calculate BMI.  General Appearance: Casual  Eye Contact:  Fair  Speech:  Clear and Coherent  Volume:  Increased  Mood:  Anxious and Dysphoric  Affect:  Constricted  Thought Process:  Coherent  Orientation:  Full (Time, Place, and Person)  Thought Content:  Logical  Suicidal Thoughts:  No  Homicidal Thoughts:  No  Memory:  Immediate;   Fair Recent;   Fair Remote;   Fair  Judgement:  Fair  Insight:  Fair  Psychomotor Activity:  Decreased  Concentration:  Concentration: Fair and Attention Span: Fair  Recall:  [employer] of Knowledge:Fair  Language: Fair  Akathisia:  No  Handed:  Right  AIMS (if indicated):  none  Assets:  Communication Skills Desire for Improvement Financial Resources/Insurance Housing Social Support Vocational/Educational  ADL's:  Intact  Cognition: WNL  Sleep:  fair    Treatment Plan Summary:  Major depressive disorder moderate Increase Effexor to 225 mg once daily Discontinue Seroquel Patient recommended to start seeing a therapist and a list of therapists given  Rule out ADHD Patient given a referral to Dr. Lynann Beaver to have an evaluation for his ADHD  Rule out binge eating disorder Continue to evaluate  Return to clinic in 3-4 weeks time or call before if needed    Elvin So, MD 2/21/20181:19 PM

## 2016-09-21 NOTE — Telephone Encounter (Signed)
Pt dropped off fitness for duty certification form In dr Diona Browner rx tower up front

## 2016-10-03 ENCOUNTER — Telehealth: Payer: Self-pay | Admitting: Family Medicine

## 2016-10-03 NOTE — Telephone Encounter (Signed)
[employer] faxed paperwork to be filled out.  In dr bedsole's IN BOX

## 2016-10-06 NOTE — Telephone Encounter (Signed)
Paperwork faxed Copy for pt  Copy for file Copy for scan Pt aware

## 2016-10-11 ENCOUNTER — Ambulatory Visit: Payer: 59 | Admitting: Psychiatry

## 2016-10-11 ENCOUNTER — Ambulatory Visit (INDEPENDENT_AMBULATORY_CARE_PROVIDER_SITE_OTHER): Payer: 59 | Admitting: Psychiatry

## 2016-10-11 ENCOUNTER — Encounter: Payer: Self-pay | Admitting: Psychiatry

## 2016-10-11 VITALS — BP 146/75 | HR 116

## 2016-10-11 DIAGNOSIS — F331 Major depressive disorder, recurrent, moderate: Secondary | ICD-10-CM | POA: Diagnosis not present

## 2016-10-11 MED ORDER — VENLAFAXINE HCL ER 75 MG PO CP24: 225.0000 mg | ORAL_CAPSULE | Freq: Every day | ORAL | 1 refills | Status: DC

## 2016-10-11 NOTE — Progress Notes (Signed)
Psychiatric progress note  Patient Identification: Blake Garcia MRN:  [id number] Date of Evaluation:  10/11/2016 Referral Source: Eliezer Lofts , MD Chief Complaint:   Chief Complaint    Follow-up; Medication Refill     Visit Diagnosis: Major Depressive Disorder, severe    History of Present Illness:  Patient is a 61 year old Caucasian male with severe obesity, major depressive disorder, and multiple other medical conditions who presents today  For follow up for depression. Patient reports doing well since we increased the Effexor to 225 mg. Tolerating it well and it has made a significant difference in his mood. He has started back at his work and is doing quite well.Fair sleep. States that he has made changes in his food habits and stopped eating anything after 7 PM. He has not had any problems with teeth tapering him off the Seroquel as well. Denies any suicidal thoughts.   Past Psychiatric History: no previous hospitalizations.  Previous Psychotropic Medications: Yes Effexor, Seroquel  Substance Abuse History in the last 12 months:  No.  Consequences of Substance Abuse: Negative  Past Medical History:  Past Medical History:  Diagnosis Date  . Basal cell carcinoma [id number]   [place]  . Cellulitis 02/09/2012   RLE  . Complication of anesthesia ~ 2000   "during OR for kidney stones; anesthesia RX made me code twice in one week"  . Depression   . Diastolic dysfunction    a. 11/2014 Echo: EF 50-55%, Gr 1 DD.  [name] History of blood transfusion 1958   "I was an Rh baby"  . Hyperlipidemia   . Hypertension   . Kidney stones   . Midsternal chest pain   . Morbid obesity ([place])   . OSA (obstructive sleep apnea)    "wear CPAP"  . Personal history of colonic adenoma 09/14/2012    Past Surgical History:  Procedure Laterality Date  . CHOLECYSTECTOMY  1993  . ESOPHAGOGASTRODUODENOSCOPY    . KNEE ARTHROSCOPY  ~ 2004   right  . LITHOTRIPSY  2000    Family  Psychiatric History: Denies  Family History:  Family History  Problem Relation Age of Onset  . Hypertension Mother   . Heart disease Mother   . Heart disease Father   . Stroke Father   . Heart disease Brother   . Coronary artery disease Brother   . Diabetes Brother   . Cancer Maternal Aunt     breast  . Cancer Maternal Uncle     lung  . Heart disease Brother   . Coronary artery disease Brother     Social History:   Social History   Social History  . Marital status: Single    Spouse name: N/A  . Number of children: 3  . Years of education: N/A   Occupational History  . 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 Smoking status: Never Smoker  . Smokeless tobacco: Never Used  . Alcohol use No     Comment: 02/09/12 "may have driink at party once/year"  . Drug use: No  . Sexual activity: Not Currently   Other Topics Concern  . None   Social History Narrative   Regular exercise-- walks one time a week      Diet: 2 meals-skips dinner, cereal,salad, soda(cheerwine diet)    Additional Social History: Patient reports that he is living with a friend currently. Reports he has been divorced twice. He has 4 children and reports having a good relationship with  all of them.  Allergies:  No Known Allergies  Metabolic Disorder Labs: Lab Results  Component Value Date   HGBA1C 6.0 12/30/2015   MPG 126 (H) 02/09/2012   No results found for: PROLACTIN Lab Results  Component Value Date   CHOL 171 12/30/2015   TRIG 101.0 12/30/2015   HDL 54.80 12/30/2015   CHOLHDL 3 12/30/2015   VLDL 20.2 12/30/2015   LDLCALC 96 12/30/2015   LDLCALC 91 09/01/2015     Current Medications: Current Outpatient Prescriptions  Medication Sig Dispense Refill  . amLODipine (NORVASC) 10 MG tablet TAKE 1 TABLET BY MOUTH EVERY DAY 90 tablet 1  . aspirin EC 81 MG tablet Take 81 mg by mouth daily.    . B-D ULTRAFINE III SHORT PEN 31G X 8 MM MISC USE TO INJECT INSULIN ONCE DAILY 100 each 2   . cholecalciferol (VITAMIN D) 1000 UNITS tablet Take 1,000 Units by mouth daily.    . cyclobenzaprine (FLEXERIL) 10 MG tablet TAKE 1 TABLET BY MOUTH AT BEDTIME AS NEEDED FOR MUSCLE SPASMS 15 tablet 0  . diclofenac (VOLTAREN) 75 MG EC tablet Take 75 mg by mouth 2 (two) times daily as needed.    [name] KLOR-CON M20 20 MEQ tablet TAKE 1 TABLET BY MOUTH EVERY DAY 90 tablet 1  . Liraglutide (VICTOZA) 18 MG/3ML SOPN Inject 0.3 mLs (1.8 mg total) into the skin at bedtime. 18 pen 3  . Multiple Vitamins-Minerals (MULTIVITAMIN ADULTS 50+) TABS Take 1 tablet by mouth daily.    . simvastatin (ZOCOR) 20 MG tablet TAKE 1 TABLET (20 MG TOTAL) BY MOUTH AT BEDTIME. 90 tablet 0  . triamcinolone cream (KENALOG) 0.1 % APPLY 1 APPLICATION TOPICALLY 2 (TWO) TIMES DAILY. (Patient taking differently: APPLY 1 APPLICATION TOPICALLY 2 (TWO) TIMES DAILY. AS NEEDED) 30 g 0  . valsartan-hydrochlorothiazide (DIOVAN-HCT) 320-25 MG tablet TAKE 1 TABLET BY MOUTH DAILY. 90 tablet 1  . venlafaxine XR (EFFEXOR-XR) 75 MG 24 hr capsule Take 3 capsules (225 mg total) by mouth daily. 90 capsule 1   No current facility-administered medications for this visit.     Neurologic: Headache: No Seizure: No Paresthesias:No  Musculoskeletal: Strength & Muscle Tone: within normal limits Gait & Station: broad based Patient leans: Front  Psychiatric Specialty Exam: ROS  Blood pressure (!) 146/75, pulse (!) 116.There is no height or weight on file to calculate BMI.  General Appearance: Casual  Eye Contact:  Fair  Speech:  Clear and Coherent  Volume:  normal  Mood:  improved  Affect:  pleasant  Thought Process:  Coherent  Orientation:  Full (Time, Place, and Person)  Thought Content:  Logical  Suicidal Thoughts:  No  Homicidal Thoughts:  No  Memory:  Immediate;   Fair Recent;   Fair Remote;   Fair  Judgement:  Fair  Insight:  Fair  Psychomotor Activity:  Decreased  Concentration:  Concentration: Fair and Attention Span: Fair   Recall:  [employer] of Knowledge:Fair  Language: Fair  Akathisia:  No  Handed:  Right  AIMS (if indicated):  none  Assets:  Communication Skills Desire for Improvement Financial Resources/Insurance Housing Social Support Vocational/Educational  ADL's:  Intact  Cognition: WNL  Sleep:  fair    Treatment Plan Summary:  Major depressive disorder moderate Continue  Effexor at 225 mg once daily Patient recommended to start seeing a therapist and a list of therapists given  Rule out ADHD Patient given a referral to Dr. Lynann Beaver to have an evaluation for his  ADHD  Rule out binge eating disorder Continue to evaluate  Return to clinic in 2 months time or call before if needed    Elvin So, MD 3/13/20181:15 PM

## 2016-11-14 ENCOUNTER — Encounter: Payer: Self-pay | Admitting: *Deleted

## 2016-11-14 NOTE — Telephone Encounter (Signed)
Opened in error  This encounter was created in error - please disregard. 

## 2016-11-15 ENCOUNTER — Encounter: Payer: Self-pay | Admitting: Family Medicine

## 2016-11-15 ENCOUNTER — Ambulatory Visit (INDEPENDENT_AMBULATORY_CARE_PROVIDER_SITE_OTHER): Payer: 59 | Admitting: Family Medicine

## 2016-11-15 VITALS — BP 140/80 | HR 99 | Temp 98.4°F | Ht 67.0 in | Wt >= 6400 oz

## 2016-11-15 DIAGNOSIS — I10 Essential (primary) hypertension: Secondary | ICD-10-CM | POA: Diagnosis not present

## 2016-11-15 DIAGNOSIS — R0609 Other forms of dyspnea: Secondary | ICD-10-CM

## 2016-11-15 DIAGNOSIS — R5381 Other malaise: Secondary | ICD-10-CM

## 2016-11-15 DIAGNOSIS — F332 Major depressive disorder, recurrent severe without psychotic features: Secondary | ICD-10-CM | POA: Diagnosis not present

## 2016-11-15 DIAGNOSIS — M79661 Pain in right lower leg: Secondary | ICD-10-CM | POA: Diagnosis not present

## 2016-11-15 DIAGNOSIS — Z6841 Body Mass Index (BMI) 40.0 and over, adult: Secondary | ICD-10-CM

## 2016-11-15 NOTE — Progress Notes (Signed)
   Subjective:    Patient ID: Blake Garcia, male    DOB: 1955-11-29, 61 y.o.   MRN: [id number]  HPI  61 year old male presents for follow up mulitple issues.  Major depressive disorder: now followed by behavioral health. He is on high dose venlafaxine. Pending ADHD eval and considering binge eating disorder. He has returned to work.[name] He is under a lot of stress.  He has not yet noted improvement in mood. His depression affects his concentration. He is unable to complete tasks as quickly as other employees.  Psychiatry feels Seroquel not needed.. Not bipolar do.  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 He cannot do this given morbid obesity and fatigue in legs.  He cannot stand for more than 5 min at a time without support.  He walks with a cane.  He has forms to be completed for "reasonable accomodation"  looking into job changes to accommodate his physical issues.   Morbid obesity and prediabetes: He has been working on weight loss. Using victoza Wt Readings from Last 3 Encounters:  11/15/16 (!) 471 lb 12 oz (214 kg)  09/20/16 (!) 486 lb (220.4 kg)  08/16/16 (!) 462 lb (209.6 kg)   He is walking as much as tolerated. Working on healthy eating.   Hypertension:    On valsartan HCTZ and amlodipine. Good control  BP Readings from Last 3 Encounters:  11/15/16 140/80  10/11/16 (!) 146/75  09/21/16 (!) 148/85  Using medication without problems or lightheadedness:  None Chest pain with exertion: none Edema: bilateral Short of breath: with exertion not at rest Average home BPs: Other issues:   Review of Systems  Constitutional: Negative for fatigue.  HENT: Negative for ear pain.   Eyes: Negative for pain.  Respiratory: Positive for shortness of breath. Negative for cough.   Cardiovascular: Negative for chest pain and leg swelling.  Gastrointestinal: Negative for abdominal pain.       Objective:   Physical Exam  Constitutional: Vital signs are  normal. He appears well-developed and well-nourished.  Morbid obesity  HENT:  Head: Normocephalic.  Right Ear: Hearing normal.  Left Ear: Hearing normal.  Nose: Nose normal.  Mouth/Throat: Oropharynx is clear and moist and mucous membranes are normal.  Neck: Trachea normal. Carotid bruit is not present. No thyroid mass and no thyromegaly present.  Cardiovascular: Normal rate, regular rhythm and normal pulses.  Exam reveals no gallop, no distant heart sounds and no friction rub.   No murmur heard. No peripheral edema  Pulmonary/Chest: Effort normal and breath sounds normal. No respiratory distress.  Musculoskeletal:       Right knee: He exhibits decreased range of motion.       Left knee: He exhibits decreased range of motion.  strength 5/5 in lower extremities  decreased ROM knees  Bilateral chronic venous changes and 1 plus nonpitting edema.  Skin: Skin is warm, dry and intact. No rash noted.  Psychiatric: He has a normal mood and affect. His speech is normal and behavior is normal. Thought content normal.          Assessment & Plan:

## 2016-11-15 NOTE — Assessment & Plan Note (Signed)
Due t obesity and deconditioning. Limits  Mobility and endurance.

## 2016-11-15 NOTE — Assessment & Plan Note (Signed)
Poor control despite venlafaxine.. Follow up with behavoiral healthy pending.

## 2016-11-15 NOTE — Progress Notes (Signed)
Pre visit review using our clinic review tool, if applicable. No additional management support is needed unless otherwise documented below in the visit note. 

## 2016-11-15 NOTE — Patient Instructions (Signed)
Keep working walking and increasing strength.. consider water exercise.  Work on low carb diet and weight loss.  Continue meds at current dose.

## 2016-11-15 NOTE — Assessment & Plan Note (Signed)
Interfering with job functions and mobility.

## 2016-11-15 NOTE — Assessment & Plan Note (Signed)
Continue vicotza and lifestyle changes.

## 2016-11-15 NOTE — Assessment & Plan Note (Signed)
Well controlled. Continue current medication.  

## 2016-12-09 ENCOUNTER — Other Ambulatory Visit: Payer: Self-pay | Admitting: Family Medicine

## 2016-12-15 ENCOUNTER — Telehealth: Payer: Self-pay | Admitting: *Deleted

## 2016-12-15 NOTE — Telephone Encounter (Signed)
Gerald Stabs left a message regarding the questionnaire  that she received on patient. Gerald Stabs needs to clarify Box #5 regarding the question limiting prolonged store hours. Gerald Stabs stated that patient is scheduled 4.5 hours per week at this point and wants to make sure that is okay.Gerald Stabs stated that she needs to clarify box # 5.

## 2016-12-15 NOTE — Telephone Encounter (Signed)
He can work those hours as long as he can sit down.. That is the key. He cannot stand for prolongued time as written on the form.

## 2016-12-16 NOTE — Telephone Encounter (Signed)
Left message for Blake Garcia that Blake Garcia can work those hours as long as he can sit down.. That is the key. He cannot stand for prolongued time as written on the form.

## 2016-12-20 ENCOUNTER — Ambulatory Visit: Payer: 59 | Admitting: Psychiatry

## 2016-12-20 ENCOUNTER — Other Ambulatory Visit: Payer: Self-pay | Admitting: Family Medicine

## 2016-12-27 ENCOUNTER — Ambulatory Visit (INDEPENDENT_AMBULATORY_CARE_PROVIDER_SITE_OTHER): Payer: 59 | Admitting: Psychiatry

## 2016-12-27 DIAGNOSIS — F331 Major depressive disorder, recurrent, moderate: Secondary | ICD-10-CM | POA: Diagnosis not present

## 2016-12-27 MED ORDER — VENLAFAXINE HCL ER 75 MG PO CP24: 225.0000 mg | ORAL_CAPSULE | Freq: Every day | ORAL | 2 refills | Status: DC

## 2016-12-27 NOTE — Progress Notes (Signed)
Psychiatric progress note  Patient Identification: Blake Garcia MRN:  [id number] Date of Evaluation:  12/27/2016 Referral Source: Eliezer Lofts , MD Chief Complaint:  Doing well  Visit Diagnosis: Major Depressive Disorder, severe    History of Present Illness:  Patient is a 61 year old Caucasian male with severe obesity, major depressive disorder, and multiple other medical conditions who presents today  For follow up for depression. Reports that he continues to do well in terms of his mood he is been taking the Effexor regularly without any side effects. 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 Denies any suicidal thoughts. States his eating is also under control. Denies binging. He would like to start therapy.  Past Psychiatric History: no previous hospitalizations.  Previous Psychotropic Medications: Yes Effexor, Seroquel  Substance Abuse History in the last 12 months:  No.  Consequences of Substance Abuse: Negative  Past Medical History:  Past Medical History:  Diagnosis Date  . Basal cell carcinoma [id number]   [place]  . Cellulitis 02/09/2012   RLE  . Complication of anesthesia ~ 2000   "during OR for kidney stones; anesthesia RX made me code twice in one week"  . Depression   . Diastolic dysfunction    a. 11/2014 Echo: EF 50-55%, Gr 1 DD.  [name] History of blood transfusion 1958   "I was an Rh baby"  . Hyperlipidemia   . Hypertension   . Kidney stones   . Midsternal chest pain   . Morbid obesity ([place])   . OSA (obstructive sleep apnea)    "wear CPAP"  . Personal history of colonic adenoma 09/14/2012    Past Surgical History:  Procedure Laterality Date  . CHOLECYSTECTOMY  1993  . ESOPHAGOGASTRODUODENOSCOPY    . KNEE ARTHROSCOPY  ~ 2004   right  . LITHOTRIPSY  2000    Family Psychiatric History: Denies  Family History:  Family History  Problem Relation Age of Onset  . Hypertension Mother    . Heart disease Mother   . Heart disease Father   . Stroke Father   . Heart disease Brother   . Coronary artery disease Brother   . Diabetes Brother   . Cancer Maternal Aunt        breast  . Cancer Maternal Uncle        lung  . Heart disease Brother   . Coronary artery disease Brother     Social History:   Social History   Social History  . Marital status: Single    Spouse name: N/A  . Number of children: 3  . Years of education: N/A   Occupational History  . 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 Smoking status: Never Smoker  . Smokeless tobacco: Never Used  . Alcohol use No     Comment: 02/09/12 "may have driink at party once/year"  . Drug use: No  . Sexual activity: Not Currently   Other Topics Concern  . Not on file   Social History Narrative   Regular exercise-- walks one time a week      Diet: 2 meals-skips dinner, cereal,salad, soda(cheerwine diet)    Additional Social History: Patient reports that he is living with a friend currently. Reports he has been divorced twice. He has 4 children and reports having a good relationship with all of them.  Allergies:  No Known Allergies  Metabolic Disorder Labs: Lab Results  Component Value Date   HGBA1C 6.0 12/30/2015   MPG 126 (H) 02/09/2012   No results found for: PROLACTIN Lab Results  Component Value Date   CHOL 171 12/30/2015   TRIG 101.0 12/30/2015   HDL 54.80 12/30/2015   CHOLHDL 3 12/30/2015   VLDL 20.2 12/30/2015   LDLCALC 96 12/30/2015   LDLCALC 91 09/01/2015     Current Medications: Current Outpatient Prescriptions  Medication Sig Dispense Refill  . amLODipine (NORVASC) 10 MG tablet TAKE 1 TABLET BY MOUTH EVERY DAY 90 tablet 0  . aspirin EC 81 MG tablet Take 81 mg by mouth daily.    . B-D ULTRAFINE III SHORT PEN 31G X 8 MM MISC USE TO INJECT INSULIN ONCE DAILY 100 each 2  . cholecalciferol (VITAMIN D) 1000 UNITS tablet Take 1,000 Units by mouth daily.    . cyclobenzaprine  (FLEXERIL) 10 MG tablet TAKE 1 TABLET BY MOUTH AT BEDTIME AS NEEDED FOR MUSCLE SPASMS 15 tablet 0  . diclofenac (VOLTAREN) 75 MG EC tablet Take 75 mg by mouth 2 (two) times daily as needed.    [name] KLOR-CON M20 20 MEQ tablet TAKE 1 TABLET BY MOUTH EVERY DAY 90 tablet 1  . Liraglutide (VICTOZA) 18 MG/3ML SOPN Inject 0.3 mLs (1.8 mg total) into the skin at bedtime. 18 pen 3  . Multiple Vitamins-Minerals (MULTIVITAMIN ADULTS 50+) TABS Take 1 tablet by mouth daily.    . simvastatin (ZOCOR) 20 MG tablet TAKE 1 TABLET (20 MG TOTAL) BY MOUTH AT BEDTIME. 90 tablet 0  . triamcinolone cream (KENALOG) 0.1 % APPLY 1 APPLICATION TOPICALLY 2 (TWO) TIMES DAILY. (Patient taking differently: APPLY 1 APPLICATION TOPICALLY 2 (TWO) TIMES DAILY. AS NEEDED) 30 g 0  . valsartan-hydrochlorothiazide (DIOVAN-HCT) 320-25 MG tablet TAKE 1 TABLET BY MOUTH DAILY. 90 tablet 1  . venlafaxine XR (EFFEXOR-XR) 75 MG 24 hr capsule Take 3 capsules (225 mg total) by mouth daily. 90 capsule 2   No current facility-administered medications for this visit.     Neurologic: Headache: No Seizure: No Paresthesias:No  Musculoskeletal: Strength & Muscle Tone: within normal limits Gait & Station: broad based Patient leans: Front  Psychiatric Specialty Exam: ROS  There were no vitals taken for this visit.There is no height or weight on file to calculate BMI.  General Appearance: Casual  Eye Contact:  Fair  Speech:  Clear and Coherent  Volume:  normal  Mood:  improved  Affect:  pleasant  Thought Process:  Coherent  Orientation:  Full (Time, Place, and Person)  Thought Content:  Logical  Suicidal Thoughts:  No  Homicidal Thoughts:  No  Memory:  Immediate;   Fair Recent;   Fair Remote;   Fair  Judgement:  Fair  Insight:  Fair  Psychomotor Activity:  Decreased  Concentration:  Concentration: Fair and Attention Span: Fair  Recall:  [employer] of Knowledge:Fair  Language: Fair  Akathisia:  No  Handed:  Right  AIMS (if  indicated):  none  Assets:  Communication Skills Desire for Improvement Financial Resources/Insurance Housing Social Support Vocational/Educational  ADL's:  Intact  Cognition: WNL  Sleep:  fair    Treatment Plan Summary:  Major depressive disorder moderate Continue  Effexor at 225 mg once daily Patient recommended to start seeing a therapist and a list of therapists given  Rule out ADHD Doing well  Rule out binge eating disorder Doing well per patient.  Return to clinic in 3 months time or call before if needed    Adellyn Capek,  Ernst Cumpston, MD 5/29/201811:25 AM

## 2017-03-08 ENCOUNTER — Other Ambulatory Visit: Payer: Self-pay | Admitting: Family Medicine

## 2017-03-08 NOTE — Telephone Encounter (Signed)
Last office visit 11/15/2016.  No labs since 12/30/2015.  Ok to refill? 

## 2017-03-16 ENCOUNTER — Other Ambulatory Visit: Payer: Self-pay | Admitting: Family Medicine

## 2017-03-16 NOTE — Telephone Encounter (Signed)
Please advise if ok to refill  Last OV 11/15/16 Last filled 12/27/16 by Dr. Gerrit Heck #[id number] [phone] #[phone]

## 2017-03-16 NOTE — Telephone Encounter (Signed)
Rx was refilled by physician nothing further is needed

## 2017-03-17 ENCOUNTER — Other Ambulatory Visit: Payer: Self-pay | Admitting: Family Medicine

## 2017-03-17 NOTE — Telephone Encounter (Signed)
Refills were approved. Nothing additional is needed

## 2017-03-17 NOTE — Telephone Encounter (Signed)
Please advise if ok to refill  Last OV 11/15/16 No pending appts

## 2017-03-18 ENCOUNTER — Other Ambulatory Visit: Payer: Self-pay | Admitting: Family Medicine

## 2017-03-28 ENCOUNTER — Ambulatory Visit: Payer: Self-pay | Admitting: Psychiatry

## 2017-04-30 ENCOUNTER — Other Ambulatory Visit: Payer: Self-pay | Admitting: Family Medicine

## 2017-04-30 DIAGNOSIS — R7309 Other abnormal glucose: Secondary | ICD-10-CM

## 2017-04-30 DIAGNOSIS — Z125 Encounter for screening for malignant neoplasm of prostate: Secondary | ICD-10-CM

## 2017-04-30 DIAGNOSIS — I10 Essential (primary) hypertension: Secondary | ICD-10-CM

## 2017-04-30 DIAGNOSIS — E782 Mixed hyperlipidemia: Secondary | ICD-10-CM

## 2017-05-01 NOTE — Telephone Encounter (Signed)
Last office visit 11/15/2016.  No labs since 12/30/2015.  Ok to refill?

## 2017-05-02 NOTE — Telephone Encounter (Signed)
Lab appointment scheduled for 05/03/2017 at 8:00 am.  Will forward to Dr. Diona Browner to enter future lab orders.

## 2017-05-02 NOTE — Telephone Encounter (Signed)
Needs lab appt for re-eval before refilled.

## 2017-05-02 NOTE — Addendum Note (Signed)
Addended by: Eliezer Lofts E on: 05/02/2017 04:34 PM   Modules accepted: Orders

## 2017-05-03 ENCOUNTER — Other Ambulatory Visit: Payer: Self-pay

## 2017-05-04 ENCOUNTER — Other Ambulatory Visit (INDEPENDENT_AMBULATORY_CARE_PROVIDER_SITE_OTHER): Payer: Self-pay

## 2017-05-04 DIAGNOSIS — Z125 Encounter for screening for malignant neoplasm of prostate: Secondary | ICD-10-CM

## 2017-05-04 DIAGNOSIS — I10 Essential (primary) hypertension: Secondary | ICD-10-CM

## 2017-05-04 DIAGNOSIS — E782 Mixed hyperlipidemia: Secondary | ICD-10-CM

## 2017-05-04 DIAGNOSIS — R7309 Other abnormal glucose: Secondary | ICD-10-CM

## 2017-05-04 LAB — COMPREHENSIVE METABOLIC PANEL
ALBUMIN: 3.8 g/dL (ref 3.5–5.2)
ALK PHOS: 80 U/L (ref 39–117)
ALT: 16 U/L (ref 0–53)
AST: 11 U/L (ref 0–37)
BILIRUBIN TOTAL: 0.4 mg/dL (ref 0.2–1.2)
BUN: 15 mg/dL (ref 6–23)
CALCIUM: 9.5 mg/dL (ref 8.4–10.5)
CHLORIDE: 99 meq/L (ref 96–112)
CO2: 32 mEq/L (ref 19–32)
CREATININE: 0.95 mg/dL (ref 0.40–1.50)
GFR: 85.78 mL/min (ref >60.00)
Glucose, Bld: 121 mg/dL — ABNORMAL HIGH (ref 70–99)
Potassium: 3.7 mEq/L (ref 3.5–5.1)
Sodium: 140 mEq/L (ref 135–145)
TOTAL PROTEIN: 6.8 g/dL (ref 6.0–8.3)

## 2017-05-04 LAB — LIPID PANEL
CHOLESTEROL: 195 mg/dL (ref 0–200)
HDL: 57.6 mg/dL (ref >39.00)
LDL CALC: 116 mg/dL — AB (ref 0–99)
NonHDL: 137.03
TRIGLYCERIDES: 103 mg/dL (ref 0.0–149.0)
Total CHOL/HDL Ratio: 3
VLDL: 20.6 mg/dL (ref 0.0–40.0)

## 2017-05-04 LAB — HEMOGLOBIN A1C: Hgb A1c MFr Bld: 6 % (ref 4.6–6.5)

## 2017-05-04 LAB — PSA: PSA: 2.89 ng/mL (ref 0.10–4.00)

## 2017-05-04 MED ORDER — POTASSIUM CHLORIDE CRYS ER 20 MEQ PO TBCR: 20.0000 meq | EXTENDED_RELEASE_TABLET | Freq: Every day | ORAL | 1 refills | Status: DC

## 2017-05-04 NOTE — Addendum Note (Signed)
Addended by: Carter Kitten on: 05/04/2017 01:44 PM   Modules accepted: Orders

## 2017-06-05 ENCOUNTER — Other Ambulatory Visit: Payer: Self-pay

## 2017-06-05 ENCOUNTER — Ambulatory Visit: Payer: 59 | Admitting: Psychiatry

## 2017-06-05 ENCOUNTER — Encounter: Payer: Self-pay | Admitting: Psychiatry

## 2017-06-05 NOTE — Progress Notes (Signed)
Psychiatric progress note  Patient Identification: Blake Garcia MRN:  [id number] Date of Evaluation:  06/05/2017 Referral Source: Eliezer Lofts , MD Chief Complaint:  Doing well Chief Complaint    Follow-up; Medication Refill; Other     Visit Diagnosis: Major Depressive Disorder in remission    History of Present Illness:  Patient is a 61 year old Caucasian male with severe obesity, major depressive disorder, and multiple other medical conditions who presents today . Patient has not been seen in 5 months. He reports that he lost his job at [employer] and the has been looking for another job. Reports that he got a Psychologist, educational and him and the new manager did not see eye to eye and he was let go. States that overall he has been doing well. States that he has been taking his medications regularly. States that he is financially okay and now applying for jobs. 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 When he went to his primary care physician they required that he have a letter from physicians that he is getting medication from. Patient today is requesting that he be given a letter about his depression and that he has been stable. He has not started seeing a therapist. States that he has been talking quite a bit to his pastor.   Past Psychiatric History: no previous hospitalizations.  Previous Psychotropic Medications: Yes Effexor, Seroquel  Substance Abuse History in the last 12 months:  No.  Consequences of Substance Abuse: Negative  Past Medical History:  Past Medical History:  Diagnosis Date  . Basal cell carcinoma [id number]   [place]  . Cellulitis 02/09/2012   RLE  . Complication of anesthesia ~ 2000   "during OR for kidney stones; anesthesia RX made me code twice in one week"  . Depression   . Diastolic dysfunction    a. 11/2014 Echo: EF 50-55%, Gr 1 DD.  [name] History of blood transfusion 1958   "I was an Rh baby"  . Hyperlipidemia    . Hypertension   . Kidney stones   . Midsternal chest pain   . Morbid obesity ([place])   . OSA (obstructive sleep apnea)    "wear CPAP"  . Personal history of colonic adenoma 09/14/2012    Past Surgical History:  Procedure Laterality Date  . CHOLECYSTECTOMY  1993  . ESOPHAGOGASTRODUODENOSCOPY    . KNEE ARTHROSCOPY  ~ 2004   right  . LITHOTRIPSY  2000    Family Psychiatric History: Denies  Family History:  Family History  Problem Relation Age of Onset  . Hypertension Mother   . Heart disease Mother   . Heart disease Father   . Stroke Father   . Heart disease Brother   . Coronary artery disease Brother   . Diabetes Brother   . Cancer Maternal Aunt        breast  . Cancer Maternal Uncle        lung  . Heart disease Brother   . Coronary artery disease Brother     Social History:   Social History   Socioeconomic History  . Marital status: Divorced    Spouse name: None  . Number of children: 3  . Years of education: None  . Highest education level: Some college, no degree  Social Needs  . Financial resource strain: Not hard at all  . Food insecurity - worry: Never true  . Food insecurity - inability: Never true  .  Transportation needs - medical: No  . Transportation needs - non-medical: No  Occupational History  . Occupation: not employed  Tobacco Use  . Smoking status: Never Smoker  . Smokeless tobacco: Never Used  Substance and Sexual Activity  . Alcohol use: No    Alcohol/week: 0.0 oz    Comment: 02/09/12 "may have driink at party once/year"  . Drug use: No  . Sexual activity: Not Currently  Other Topics Concern  . None  Social History Narrative   Regular exercise-- walks one time a week      Diet: 2 meals-skips dinner, cereal,salad, soda(cheerwine diet)    Additional Social History: Patient reports that he is living with a friend currently. Reports he has been divorced twice. He has 4 children and reports having a good relationship with all of  them.  Allergies:  No Known Allergies  Metabolic Disorder Labs: Lab Results  Component Value Date   HGBA1C 6.0 05/04/2017   MPG 126 (H) 02/09/2012   No results found for: PROLACTIN Lab Results  Component Value Date   CHOL 195 05/04/2017   TRIG 103.0 05/04/2017   HDL 57.60 05/04/2017   CHOLHDL 3 05/04/2017   VLDL 20.6 05/04/2017   LDLCALC 116 (H) 05/04/2017   LDLCALC 96 12/30/2015     Current Medications: Current Outpatient Medications  Medication Sig Dispense Refill  . amLODipine (NORVASC) 10 MG tablet TAKE 1 TABLET BY MOUTH EVERY DAY 90 tablet 0  . aspirin EC 81 MG tablet Take 81 mg by mouth daily.    . B-D ULTRAFINE III SHORT PEN 31G X 8 MM MISC USE TO INJECT INSULIN ONCE DAILY 100 each 2  . cholecalciferol (VITAMIN D) 1000 UNITS tablet Take 1,000 Units by mouth daily.    . cyclobenzaprine (FLEXERIL) 10 MG tablet TAKE 1 TABLET BY MOUTH AT BEDTIME AS NEEDED FOR MUSCLE SPASMS 15 tablet 0  . diclofenac (VOLTAREN) 75 MG EC tablet Take 75 mg by mouth 2 (two) times daily as needed.    . Liraglutide (VICTOZA) 18 MG/3ML SOPN Inject 0.3 mLs (1.8 mg total) into the skin at bedtime. 18 pen 3  . Multiple Vitamins-Minerals (MULTIVITAMIN ADULTS 50+) TABS Take 1 tablet by mouth daily.    . potassium chloride SA (KLOR-CON M20) 20 MEQ tablet Take 1 tablet (20 mEq total) by mouth daily. 90 tablet 1  . simvastatin (ZOCOR) 20 MG tablet TAKE 1 TABLET (20 MG TOTAL) BY MOUTH AT BEDTIME. 90 tablet 0  . triamcinolone cream (KENALOG) 0.1 % APPLY 1 APPLICATION TOPICALLY 2 (TWO) TIMES DAILY. (Patient taking differently: APPLY 1 APPLICATION TOPICALLY 2 (TWO) TIMES DAILY. AS NEEDED) 30 g 0  . valsartan-hydrochlorothiazide (DIOVAN-HCT) 320-25 MG tablet TAKE 1 TABLET BY MOUTH DAILY. 90 tablet 1  . venlafaxine XR (EFFEXOR-XR) 75 MG 24 hr capsule Take 3 capsules (225 mg total) by mouth daily. 90 capsule 2  . venlafaxine XR (EFFEXOR-XR) 150 MG 24 hr capsule TAKE 1 CAPSULE (150 MG TOTAL) BY MOUTH DAILY.  (Patient not taking: Reported on 06/05/2017) 90 capsule 1   No current facility-administered medications for this visit.     Neurologic: Headache: No Seizure: No Paresthesias:No  Musculoskeletal: Strength & Muscle Tone: within normal limits Gait & Station: broad based Patient leans: Front  Psychiatric Specialty Exam: ROS  Blood pressure (!) 148/98, pulse 84, temperature 98.2 F (36.8 C), temperature source Oral.There is no height or weight on file to calculate BMI.  General Appearance: Casual  Eye Contact:  Fair  Speech:  Clear  and Coherent  Volume:  normal  Mood:  Good   Affect:  pleasant  Thought Process:  Coherent  Orientation:  Full (Time, Place, and Person)  Thought Content:  Logical  Suicidal Thoughts:  No  Homicidal Thoughts:  No  Memory:  Immediate;   Fair Recent;   Fair Remote;   Fair  Judgement:  Fair  Insight:  Fair  Psychomotor Activity:  Decreased  Concentration:  Concentration: Fair and Attention Span: Fair  Recall:  [employer] of Knowledge:Fair  Language: Fair  Akathisia:  No  Handed:  Right  AIMS (if indicated):  none  Assets:  Communication Skills Desire for Improvement Financial Resources/Insurance Housing Social Support Vocational/Educational  ADL's:  Intact  Cognition: WNL  Sleep:  fair    Treatment Plan Summary:  Major depressive disorder moderate Continue  Effexor at 225 mg once daily Patient told that he will follow up with a new psychiatrist Dr.Eappen starting next month. He will be assessed at the time and given a letter about the stability of his depression.  Rule out ADHD Doing well  Rule out binge eating disorder Doing well per patient.  Return to clinic in 1 months time or call before if needed. Patient will follow up with Dr.Eappen.    Elvin So, MD 11/5/201811:21 AM

## 2017-07-05 ENCOUNTER — Ambulatory Visit: Payer: Self-pay | Admitting: Psychiatry

## 2017-07-05 ENCOUNTER — Other Ambulatory Visit: Payer: Self-pay

## 2017-07-05 ENCOUNTER — Encounter: Payer: Self-pay | Admitting: Psychiatry

## 2017-07-05 VITALS — BP 161/79 | HR 121 | Temp 98.3°F | Wt >= 6400 oz

## 2017-07-05 DIAGNOSIS — G4733 Obstructive sleep apnea (adult) (pediatric): Secondary | ICD-10-CM

## 2017-07-05 DIAGNOSIS — F3342 Major depressive disorder, recurrent, in full remission: Secondary | ICD-10-CM

## 2017-07-05 NOTE — Progress Notes (Signed)
[facility] MD OP Progress Note  07/05/2017 9:32 AM Blake Garcia  MRN:  [id number]  Chief Complaint: ' I am doing well."  Chief Complaint    Follow-up; Medication Refill     HPI: Is a 61 year old Caucasian male who is divorced, lives in [place], currently unemployed has a history of MDD, currently in remission, obstructive sleep apnea as well as morbid obesity and other medical problems who presented to the clinic today for a follow-up visit.  Haakon used to follow up with Dr. Einar Grad in the past.  Thanh reports that he is currently doing well on his Effexor.  He denies any mood symptoms at this time.  He reports his appetite as fair.  He reports his sleep is fair.  He also has OSA and he is compliant with his CPAP.  He denies any suicidal ideation or homicidal ideations.  He denies any perceptual disturbances.  Orvel reports he is compliant on his medications.  He denies any ADRs.  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  He reports he will need a letter from Probation officer regarding his care here which he can present to DOT.  Orlan continues to have a good relationship with his children.  His youngest child continues to visit him on weekends.  He continues to be involved in their lives.  He reports he continues to struggle with some physical problems, including limited mobility due to him being morbidly obese.  He uses a cane to walk.  Shane's blood pressure and heart rate today seems to be elevated.  This was discussed with Elta Guadeloupe.  Discussed with him to get a few readings at home.  Discussed with him to reach out to his primary medical doctor.  Also discussed the effect of Effexor on his blood pressure.  However since he has been stable on the Effexor and he has had blood pressure all his life, will not make any changes today with his antidepressant.  However he will reach out to his PMD and discuss the same.   Visit  Diagnosis:    ICD-10-CM   1. MDD (major depressive disorder), recurrent, in full remission ([employer]) F33.42   2. Obstructive sleep apnea G47.33     Past Psychiatric History: Denies past IP admissions for mental health problems.  He denies any past suicide attempts.  He used to follow up with Dr. Einar Grad here in this clinic.  Past trials of Seroquel.  Past Medical History:  Past Medical History:  Diagnosis Date  . Basal cell carcinoma [id number]   [place]  . Cellulitis 02/09/2012   RLE  . Complication of anesthesia ~ 2000   "during OR for kidney stones; anesthesia RX made me code twice in one week"  . Depression   . Diastolic dysfunction    a. 11/2014 Echo: EF 50-55%, Gr 1 DD.  [name] History of blood transfusion 1958   "I was an Rh baby"  . Hyperlipidemia   . Hypertension   . Kidney stones   . Midsternal chest pain   . Morbid obesity ([place])   . OSA (obstructive sleep apnea)    "wear CPAP"  . Personal history of colonic adenoma 09/14/2012    Past Surgical History:  Procedure Laterality Date  . CHOLECYSTECTOMY  1993  . ESOPHAGOGASTRODUODENOSCOPY    . KNEE ARTHROSCOPY  ~ 2004   right  . LITHOTRIPSY  2000    Family Psychiatric History: Both sons have ADHD, his daughter has ADHD.  Denies any suicide attempts in his family.  Denies any history of substance abuse in his family.  Family History:  Family History  Problem Relation Age of Onset  . Hypertension Mother   . Heart disease Mother   . Heart disease Father   . Stroke Father   . Heart disease Brother   . Coronary artery disease Brother   . Diabetes Brother   . Cancer Maternal Aunt        breast  . Cancer Maternal Uncle        lung  . Heart disease Brother   . Coronary artery disease Brother   . ADD / ADHD Son   . ADD / ADHD Son   . ADD / ADHD Daughter     Social History: He lives in [place].  He is currently divorced.  He has 4 children.  His youngest child is 65 years old, daughter, lives with him on weekends.   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  He is currently unemployed since the past few months.  He is looking for new jobs at this time. Social History   Socioeconomic History  . Marital status: Divorced    Spouse name: None  . Number of children: 3  . Years of education: None  . Highest education level: Some college, no degree  Social Needs  . Financial resource strain: Not hard at all  . Food insecurity - worry: Never true  . Food insecurity - inability: Never true  . Transportation needs - medical: No  . Transportation needs - non-medical: No  Occupational History  . Occupation: not employed  Tobacco Use  . Smoking status: Never Smoker  . Smokeless tobacco: Never Used  Substance and Sexual Activity  . Alcohol use: No    Alcohol/week: 0.0 oz    Comment: 02/09/12 "may have driink at party once/year"  . Drug use: No  . Sexual activity: Not Currently  Other Topics Concern  . None  Social History Narrative   Regular exercise-- walks one time a week      Diet: 2 meals-skips dinner, cereal,salad, soda(cheerwine diet)    Allergies: No Known Allergies  Metabolic Disorder Labs: Lab Results  Component Value Date   HGBA1C 6.0 05/04/2017   MPG 126 (H) 02/09/2012   No results found for: PROLACTIN Lab Results  Component Value Date   CHOL 195 05/04/2017   TRIG 103.0 05/04/2017   HDL 57.60 05/04/2017   CHOLHDL 3 05/04/2017   VLDL 20.6 05/04/2017   LDLCALC 116 (H) 05/04/2017   LDLCALC 96 12/30/2015   Lab Results  Component Value Date   TSH 2.503 02/11/2012   TSH 1.929 02/09/2012    Therapeutic Level Labs: No results found for: LITHIUM No results found for: VALPROATE No components found for:  CBMZ  Current Medications: Current Outpatient Medications  Medication Sig Dispense Refill  . amLODipine (NORVASC) 10 MG tablet TAKE 1 TABLET BY MOUTH EVERY DAY 90 tablet 0  . aspirin EC 81 MG tablet Take 81 mg by mouth daily.    .  B-D ULTRAFINE III SHORT PEN 31G X 8 MM MISC USE TO INJECT INSULIN ONCE DAILY 100 each 2  . cholecalciferol (VITAMIN D) 1000 UNITS tablet Take 1,000 Units by mouth daily.    . cyclobenzaprine (FLEXERIL) 10 MG tablet TAKE 1 TABLET BY MOUTH  AT BEDTIME AS NEEDED FOR MUSCLE SPASMS 15 tablet 0  . diclofenac (VOLTAREN) 75 MG EC tablet Take 75 mg by mouth 2 (two) times daily as needed.    . Liraglutide (VICTOZA) 18 MG/3ML SOPN Inject 0.3 mLs (1.8 mg total) into the skin at bedtime. 18 pen 3  . Multiple Vitamins-Minerals (MULTIVITAMIN ADULTS 50+) TABS Take 1 tablet by mouth daily.    . potassium chloride SA (KLOR-CON M20) 20 MEQ tablet Take 1 tablet (20 mEq total) by mouth daily. 90 tablet 1  . simvastatin (ZOCOR) 20 MG tablet TAKE 1 TABLET (20 MG TOTAL) BY MOUTH AT BEDTIME. 90 tablet 0  . triamcinolone cream (KENALOG) 0.1 % APPLY 1 APPLICATION TOPICALLY 2 (TWO) TIMES DAILY. (Patient taking differently: APPLY 1 APPLICATION TOPICALLY 2 (TWO) TIMES DAILY. AS NEEDED) 30 g 0  . valsartan-hydrochlorothiazide (DIOVAN-HCT) 320-25 MG tablet TAKE 1 TABLET BY MOUTH DAILY. 90 tablet 1  . venlafaxine XR (EFFEXOR-XR) 75 MG 24 hr capsule Take 3 capsules (225 mg total) by mouth daily. 90 capsule 2   No current facility-administered medications for this visit.      Musculoskeletal: Strength & Muscle Tone: within normal limits Gait & Station: normal Patient leans: N/A  Psychiatric Specialty Exam: Review of Systems  Psychiatric/Behavioral: Positive for depression (stable).  All other systems reviewed and are negative.   Blood pressure (!) 161/79, pulse (!) 121, temperature 98.3 F (36.8 C), temperature source Oral, weight (!) 471 lb 9.6 oz (213.9 kg).Body mass index is 73.86 kg/m.  General Appearance: Casual  Eye Contact:  Fair  Speech:  Clear and Coherent  Volume:  Normal  Mood:  Euthymic  Affect:  Congruent  Thought Process:  Goal Directed and Descriptions of Associations: Intact  Orientation:  Full  (Time, Place, and Person)  Thought Content: Logical   Suicidal Thoughts:  No  Homicidal Thoughts:  No  Memory:  Immediate;   Fair Recent;   Fair Remote;   Fair  Judgement:  Fair  Insight:  Fair  Psychomotor Activity:  Normal  Concentration:  Concentration: Fair and Attention Span: Fair  Recall:  [employer] of Knowledge: Fair  Language: Fair  Akathisia:  No  Handed:  Right  AIMS (if indicated): NA  Assets:  Communication Skills Desire for Jessie Talents/Skills Transportation Vocational/Educational  ADL's:  Intact  Cognition: WNL  Sleep:  Fair   Screenings: PHQ2-9     Office Visit from 08/16/2016 in [facility] at [facility] Visit from 01/04/2016 in [place] at [place] from 01/27/2015 in [facility] at [facility] from 11/28/2014 in [facility] at [facility] Total Score  4  0  0  4  PHQ-9 Total Score  15  No data  No data  19       Assessment and Plan: Daquan is a 61 year old Caucasian male who has a history of depression, currently in remission as well as OSA, morbid obesity and other medical problems who presented to the clinic today for a follow-up visit.  This is writer's first appointment with Elta Guadeloupe, he used to follow up with Dr. Einar Grad in the past.  I was able to review notes in EHR per Dr. Einar Grad.  Travell today reports he is doing stable on his medications.  No changes to be made at this visit.  Plan as discussed below.  Plan For depression in remission Continue Effexor  225 mg p.o. Daily.  For OSA He is  compliant with his CPAP per report  For elevated BP and HR Discussed with patient about his elevated blood pressure and heart rate this visit.  Discussed with him about the effect of Effexor on his BP.  Since he is currently stable on his antidepressant, will not make any changes today.  However he will reach out to his PMD since he has been on antihypertensive  medications since the past several years as well as he is morbidly obese, with limited mobility, has OSA.  Several factors could be contributing to his current elevated BP.  Patient to discuss medication readjustments with his PMD.  Discussed leisure activities, exercise.  Discussed with patient that he could pick up his letter for DOT from clinic tomorrow.  He agrees with plan.  Follow-up in 3 months or sooner if needed.  More than 50 % of the time was spent for psychoeducation and supportive psychotherapy and care coordination.   This note was generated in part or whole with voice recognition software. Voice recognition is usually quite accurate but there are transcription errors that can and very often do occur. I apologize for any typographical errors that were not detected and corrected.        Ursula Alert, MD 07/05/2017, 9:32 AM

## 2017-07-26 ENCOUNTER — Other Ambulatory Visit: Payer: Self-pay | Admitting: Family Medicine

## 2017-08-02 ENCOUNTER — Other Ambulatory Visit: Payer: Self-pay | Admitting: Family Medicine

## 2017-09-14 ENCOUNTER — Other Ambulatory Visit: Payer: Self-pay | Admitting: Psychiatry

## 2017-10-03 ENCOUNTER — Ambulatory Visit: Payer: Self-pay | Admitting: Psychiatry

## 2017-11-02 ENCOUNTER — Encounter: Payer: Self-pay | Admitting: Psychiatry

## 2017-11-02 ENCOUNTER — Ambulatory Visit: Payer: Self-pay | Admitting: Psychiatry

## 2017-11-02 ENCOUNTER — Other Ambulatory Visit: Payer: Self-pay

## 2017-11-02 VITALS — BP 168/98 | HR 116 | Temp 97.8°F | Ht 67.0 in

## 2017-11-02 DIAGNOSIS — G4733 Obstructive sleep apnea (adult) (pediatric): Secondary | ICD-10-CM

## 2017-11-02 DIAGNOSIS — F3342 Major depressive disorder, recurrent, in full remission: Secondary | ICD-10-CM

## 2017-11-02 MED ORDER — VENLAFAXINE HCL ER 75 MG PO CP24: 225.0000 mg | ORAL_CAPSULE | Freq: Every day | ORAL | 1 refills | Status: DC

## 2017-11-02 NOTE — Progress Notes (Signed)
[facility] MD OP Progress Note  11/02/2017 12:52 PM Blake Garcia  MRN:  [id number]  Chief Complaint: ' I am better.' Chief Complaint    Follow-up; Medication Refill     HPI: Blake Garcia is a 62 year old Caucasian male, divorced, lives in [place], currently employed, has a history of MDD, currently in remission, OSA, morbid obesity as well as other multiple medical problems, presented to the clinic today for a follow-up visit.  Blake Garcia today reports he was able to find a new job.  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  Patient reports he has been enjoying it.  He reports he continues to need to work on building his energy level.  He reports he continues to work on losing weight.  He reports he may have lost at least 20 pounds since our last visit.  He reports he had a physical done in February and his weight had come down at that point.  He reports he continues to do calorie counting and exercising.  He reports his children are doing well.  His older kids continue to do well at the jobs. His 67 yr old son got selected to do [employer] in [place].  His 69 year old daughter is currently getting ready to start college applications, is working on her SAT and so on.  He reports his children and him continues to have a very good relationship.  He is trying to take his 74 year old with her friend to a concert in [place] soon.  He denies any other concerns today. Visit Diagnosis:    ICD-10-CM   1. MDD (major depressive disorder), recurrent, in full remission ([place]) F33.42 venlafaxine XR (EFFEXOR-XR) 75 MG 24 hr capsule  2. Obstructive sleep apnea G47.33     Past Psychiatric History: Denies past inpatient admission for mental health problems.  He denies any past suicide attempts.  He used to follow-up with Dr. Einar Grad here in this clinic.  Past trials of Seroquel.  Past Medical History:  Past Medical History:  Diagnosis Date  . Basal cell carcinoma [id number]   [place]  . Cellulitis 02/09/2012   RLE  . Complication of anesthesia ~ 2000   "during OR for kidney stones; anesthesia RX made me code twice in one week"  . Depression   . Diastolic dysfunction    a. 11/2014 Echo: EF 50-55%, Gr 1 DD.  [name] History of blood transfusion 1958   "I was an Rh baby"  . Hyperlipidemia   . Hypertension   . Kidney stones   . Midsternal chest pain   . Morbid obesity ([place])   . OSA (obstructive sleep apnea)    "wear CPAP"  . Personal history of colonic adenoma 09/14/2012    Past Surgical History:  Procedure Laterality Date  . CHOLECYSTECTOMY  1993  . ESOPHAGOGASTRODUODENOSCOPY    . KNEE ARTHROSCOPY  ~ 2004   right  . LITHOTRIPSY  2000    Family Psychiatric History: Both Sons have ADHD, his daughter has ADHD.  Denies any suicide attempts in his family.  Denies any history of substance abuse in his family.  Family History:  Family History  Problem Relation Age of Onset  . Hypertension Mother   . Heart disease Mother   . Heart disease Father   . Stroke Father   . Heart disease Brother   . Coronary artery disease Brother   . Diabetes Brother   . Cancer Maternal Aunt  breast  . Cancer Maternal Uncle        lung  . Heart disease Brother   . Coronary artery disease Brother   . ADD / ADHD Son   . ADD / ADHD Son   . ADD / ADHD Daughter     Social History: Lives in [place].  He is currently divorced.  He has 4 children.  His youngest child is 18 year old daughter ,lives with him on weekends.  He is a TEFL teacher.  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 Social History   Socioeconomic History  . Marital status: Divorced    Spouse name: Not on file  . Number of children: 3  . Years of education: Not on file  . Highest education level: Some college, no degree  Occupational History  . 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  Financial resource strain: Not hard at all  . Food insecurity:    Worry: Never true    Inability: Never true  . Transportation needs:    Medical: No    Non-medical: No  Tobacco Use  . Smoking status: Never Smoker  . Smokeless tobacco: Never Used  Substance and Sexual Activity  . Alcohol use: No    Alcohol/week: 0.0 oz    Comment: 02/09/12 "may have driink at party once/year"  . Drug use: No  . Sexual activity: Not Currently  Lifestyle  . Physical activity:    Days per week: 0 days    Minutes per session: 0 min  . Stress: Only a little  Relationships  . Social connections:    Talks on phone: More than three times a week    Gets together: More than three times a week    Attends religious service: More than 4 times per year    Active member of club or organization: Yes    Attends meetings of clubs or organizations: More than 4 times per year    Relationship status: Divorced  Other Topics Concern  . Not on file  Social History Narrative   Regular exercise-- walks one time a week      Diet: 2 meals-skips dinner, cereal,salad, soda(cheerwine diet)    Allergies: No Known Allergies  Metabolic Disorder Labs: Lab Results  Component Value Date   HGBA1C 6.0 05/04/2017   MPG 126 (H) 02/09/2012   No results found for: PROLACTIN Lab Results  Component Value Date   CHOL 195 05/04/2017   TRIG 103.0 05/04/2017   HDL 57.60 05/04/2017   CHOLHDL 3 05/04/2017   VLDL 20.6 05/04/2017   LDLCALC 116 (H) 05/04/2017   LDLCALC 96 12/30/2015   Lab Results  Component Value Date   TSH 2.503 02/11/2012   TSH 1.929 02/09/2012    Therapeutic Level Labs: No results found for: LITHIUM No results found for: VALPROATE No components found for:  CBMZ  Current Medications: Current Outpatient Medications  Medication Sig Dispense Refill  . amLODipine (NORVASC) 10 MG tablet TAKE 1 TABLET BY MOUTH EVERY DAY 90 tablet 0  . aspirin EC 81 MG tablet Take 81 mg by mouth daily.    . B-D ULTRAFINE  III SHORT PEN 31G X 8 MM MISC USE TO INJECT INSULIN ONCE DAILY 100 each 2  . cholecalciferol (VITAMIN D) 1000 UNITS tablet Take 1,000 Units by mouth daily.    . cyclobenzaprine (FLEXERIL) 10 MG tablet TAKE 1 TABLET  BY MOUTH AT BEDTIME AS NEEDED FOR MUSCLE SPASMS 15 tablet 0  . diclofenac (VOLTAREN) 75 MG EC tablet Take 75 mg by mouth 2 (two) times daily as needed.    . Liraglutide (VICTOZA) 18 MG/3ML SOPN Inject 0.3 mLs (1.8 mg total) into the skin at bedtime. 18 pen 3  . Multiple Vitamins-Minerals (MULTIVITAMIN ADULTS 50+) TABS Take 1 tablet by mouth daily.    . potassium chloride SA (KLOR-CON M20) 20 MEQ tablet Take 1 tablet (20 mEq total) by mouth daily. 90 tablet 1  . simvastatin (ZOCOR) 20 MG tablet TAKE 1 TABLET (20 MG TOTAL) BY MOUTH AT BEDTIME. 90 tablet 0  . triamcinolone cream (KENALOG) 0.1 % APPLY 1 APPLICATION TOPICALLY 2 (TWO) TIMES DAILY. (Patient taking differently: APPLY 1 APPLICATION TOPICALLY 2 (TWO) TIMES DAILY. AS NEEDED) 30 g 0  . valsartan-hydrochlorothiazide (DIOVAN-HCT) 320-25 MG tablet TAKE 1 TABLET BY MOUTH DAILY. 90 tablet 0  . venlafaxine XR (EFFEXOR-XR) 75 MG 24 hr capsule Take 3 capsules (225 mg total) by mouth daily. 270 capsule 1   No current facility-administered medications for this visit.      Musculoskeletal: Strength & Muscle Tone: within normal limits Gait & Station: normal Patient leans: N/A  Psychiatric Specialty Exam: Review of Systems  Psychiatric/Behavioral: Negative for depression, hallucinations, substance abuse and suicidal ideas. The patient is not nervous/anxious and does not have insomnia.   All other systems reviewed and are negative.   Blood pressure (!) 168/98, pulse (!) 116, temperature 97.8 F (36.6 C), temperature source Oral.There is no height or weight on file to calculate BMI.  General Appearance: Casual  Eye Contact:  Fair  Speech:  Clear and Coherent  Volume:  Normal  Mood:  Euthymic  Affect:  Appropriate  Thought Process:   Goal Directed and Descriptions of Associations: Intact  Orientation:  Full (Time, Place, and Person)  Thought Content: Logical   Suicidal Thoughts:  No  Homicidal Thoughts:  No  Memory:  Immediate;   Fair Recent;   Fair Remote;   Fair  Judgement:  Fair  Insight:  Fair  Psychomotor Activity:  Normal  Concentration:  Concentration: Fair and Attention Span: Fair  Recall:  [employer] of Knowledge: Fair  Language: Fair  Akathisia:  No  Handed:  Right  AIMS (if indicated):NA  Assets:  Communication Skills Desire for Improvement Housing Social Support  ADL's:  Intact  Cognition: WNL  Sleep:  Fair   Screenings: PHQ2-9     Office Visit from 08/16/2016 in [place] at [facility] Visit from 01/04/2016 in [facility] at [facility] from 01/27/2015 in [facility] at [facility] from 11/28/2014 in [place] at [facility] Total Score  4  0  0  4  PHQ-9 Total Score  15  -  -  19       Assessment and Plan: Tahje is a 62 year old Caucasian male who has a history of depression, currently in remission, OSA, morbid obesity, other medical problems, presented to the clinic today for a follow-up visit.  Kahleb he used to follow-up with Dr. Einar Grad in the past.  Val is currently doing well on the current medications.  He continues to have good social support system.  He denies any current changes with his mood.  Plan as noted below.  Plan Depression in remission Continue Effexor 225 mg p.o. daily.  OSA He continues to be compliant with CPAP  For elevated blood pressure  and heart rate Patient reports that could be because of him walking to the clinic.  He is morbidly obese.  Patient is currently working on his weight, following calorie counting and exercise routines.  Patient also could follow-up with PMD as needed.  Continue leisure activities, exercise routine.  Follow-up in clinic in 5 months or sooner if needed.  More  than 50 % of the time was spent for psychoeducation and supportive psychotherapy and care coordination. This note was generated in part or whole with voice recognition software. Voice recognition is usually quite accurate but there are transcription errors that can and very often do occur. I apologize for any typographical errors that were not detected and corrected.      Ursula Alert, MD 11/02/2017, 12:52 PM

## 2017-11-03 ENCOUNTER — Ambulatory Visit: Payer: Self-pay | Admitting: Psychiatry

## 2017-11-06 ENCOUNTER — Other Ambulatory Visit: Payer: Self-pay | Admitting: Family Medicine

## 2017-11-06 MED ORDER — VALSARTAN-HYDROCHLOROTHIAZIDE 320-25 MG PO TABS: 1.0000 | ORAL_TABLET | Freq: Every day | ORAL | 0 refills | Status: DC

## 2017-12-28 ENCOUNTER — Encounter: Payer: Self-pay | Admitting: Family Medicine

## 2018-01-11 ENCOUNTER — Other Ambulatory Visit: Payer: Self-pay | Admitting: *Deleted

## 2018-01-11 MED ORDER — POTASSIUM CHLORIDE CRYS ER 20 MEQ PO TBCR: 20.0000 meq | EXTENDED_RELEASE_TABLET | Freq: Every day | ORAL | 0 refills | Status: DC

## 2018-01-26 ENCOUNTER — Other Ambulatory Visit: Payer: Self-pay | Admitting: Family Medicine

## 2018-01-26 MED ORDER — AMLODIPINE BESYLATE 10 MG PO TABS: 10.0000 mg | ORAL_TABLET | Freq: Every day | ORAL | 0 refills | Status: DC

## 2018-01-26 NOTE — Telephone Encounter (Signed)
I spoke with pt and he request 90 day supply and pt will keep CPX appt on 02/02/18. Last seen 11/15/16; refilled per protocol and pt voiced understanding.

## 2018-02-02 ENCOUNTER — Encounter: Payer: Self-pay | Admitting: Family Medicine

## 2018-02-20 ENCOUNTER — Other Ambulatory Visit: Payer: Self-pay | Admitting: Family Medicine

## 2018-02-21 MED ORDER — VALSARTAN-HYDROCHLOROTHIAZIDE 320-25 MG PO TABS: 1.0000 | ORAL_TABLET | Freq: Every day | ORAL | 0 refills | Status: DC

## 2018-02-21 MED ORDER — SIMVASTATIN 20 MG PO TABS: 20.0000 mg | ORAL_TABLET | Freq: Every day | ORAL | 0 refills | Status: DC

## 2018-03-21 ENCOUNTER — Other Ambulatory Visit: Payer: Self-pay | Admitting: Psychiatry

## 2018-03-21 DIAGNOSIS — F3342 Major depressive disorder, recurrent, in full remission: Secondary | ICD-10-CM

## 2018-04-04 ENCOUNTER — Ambulatory Visit: Payer: Self-pay | Admitting: Psychiatry

## 2018-04-05 ENCOUNTER — Encounter: Payer: Self-pay | Admitting: Psychiatry

## 2018-04-05 ENCOUNTER — Other Ambulatory Visit: Payer: Self-pay

## 2018-04-05 ENCOUNTER — Ambulatory Visit: Payer: Self-pay | Admitting: Psychiatry

## 2018-04-05 VITALS — BP 157/78 | HR 121 | Temp 98.0°F | Ht 67.0 in | Wt >= 6400 oz

## 2018-04-05 DIAGNOSIS — F3342 Major depressive disorder, recurrent, in full remission: Secondary | ICD-10-CM

## 2018-04-05 MED ORDER — VENLAFAXINE HCL ER 75 MG PO CP24: ORAL_CAPSULE | ORAL | 1 refills | Status: DC

## 2018-04-05 NOTE — Progress Notes (Signed)
[facility] MD OP Progress Note  04/05/2018 5:38 PM Blake Garcia  MRN:  [id number]  Chief Complaint: ' I am here for follow up." Chief Complaint    Follow-up; Medication Refill     HPI: Blake Garcia is a 62 year old Caucasian male, divorced, lives in [place], currently employed, has a history of MDD, currently in remission, OSA, morbid obesity as well as multiple medical problems, presented to the clinic today for a follow-up visit.  Blake Garcia today reports he continues to do well with regards to his depressive symptoms.  He is compliant on his Effexor.  He denies any side effects.  He denies any sadness, crying spells, sleep problems or appetite changes.  He denies any suicidality.  He denies any homicidality.  He denies any perceptual disturbances.  He reports he has been trying to lose weight and has started exercising more and is trying to watch his diet.  He denies any other concerns today. Visit Diagnosis:    ICD-10-CM   1. MDD (major depressive disorder), recurrent, in full remission (Blake Garcia) F33.42 venlafaxine XR (EFFEXOR-XR) 75 MG 24 hr capsule    Past Psychiatric History: Reviewed past psychiatric history from my progress note on 11/02/2017.  Past trials of Seroquel.  Past Medical History:  Past Medical History:  Diagnosis Date  . Basal cell carcinoma [id number]   Blake Garcia  . Cellulitis 02/09/2012   Blake Garcia  . Complication of anesthesia ~ 2000   "during OR for kidney stones; anesthesia RX made me code twice in one week"  . Depression   . Diastolic dysfunction    a. 11/2014 Echo: EF 50-55%, Gr 1 DD.  [name] History of blood transfusion 1958   "I was an Rh baby"  . Hyperlipidemia   . Hypertension   . Kidney stones   . Midsternal chest pain   . Morbid obesity (Blake Garcia)   . OSA (obstructive sleep apnea)    "wear CPAP"  . Personal history of colonic adenoma 09/14/2012    Past Surgical History:  Procedure Laterality Date  . CHOLECYSTECTOMY  1993  . ESOPHAGOGASTRODUODENOSCOPY    . KNEE  ARTHROSCOPY  ~ 2004   right  . LITHOTRIPSY  2000    Family Psychiatric History: Reviewed family psychiatric history from my progress note on 11/02/2017.  Family History:  Family History  Problem Relation Age of Onset  . Hypertension Mother   . Heart disease Mother   . Heart disease Father   . Stroke Father   . Heart disease Brother   . Coronary artery disease Brother   . Diabetes Brother   . Cancer Maternal Aunt        breast  . Cancer Maternal Uncle        lung  . Heart disease Brother   . Coronary artery disease Brother   . ADD / ADHD Son   . ADD / ADHD Son   . ADD / ADHD Daughter     Social History: Reviewed social history from my progress note on 11/02/2017 Social History   Socioeconomic History  . Marital status: Divorced    Spouse name: Not on file  . Number of children: 3  . Years of education: Not on file  . Highest education level: Some college, no degree  Occupational History  . 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 Financial resource strain: Not hard at all  . Food insecurity:    Worry: Never true    Inability: Never true  . Transportation needs:  Medical: No    Non-medical: No  Tobacco Use  . Smoking status: Never Smoker  . Smokeless tobacco: Never Used  Substance and Sexual Activity  . Alcohol use: No    Alcohol/week: 0.0 standard drinks    Comment: 02/09/12 "may have driink at party once/year"  . Drug use: No  . Sexual activity: Not Currently  Lifestyle  . Physical activity:    Days per week: 0 days    Minutes per session: 0 min  . Stress: Only a little  Relationships  . Social connections:    Talks on phone: More than three times a week    Gets together: More than three times a week    Attends religious service: More than 4 times per year    Active member of club or organization: Yes    Attends meetings of clubs or organizations: More than 4 times per year    Relationship status: Divorced  Other Topics Concern  . Not on file   Social History Narrative   Regular exercise-- walks one time a week      Diet: 2 meals-skips dinner, cereal,salad, soda(cheerwine diet)    Allergies: No Known Allergies  Metabolic Disorder Labs: Lab Results  Component Value Date   HGBA1C 6.0 05/04/2017   MPG 126 (H) 02/09/2012   No results found for: PROLACTIN Lab Results  Component Value Date   CHOL 195 05/04/2017   TRIG 103.0 05/04/2017   HDL 57.60 05/04/2017   CHOLHDL 3 05/04/2017   VLDL 20.6 05/04/2017   LDLCALC 116 (H) 05/04/2017   LDLCALC 96 12/30/2015   Lab Results  Component Value Date   TSH 2.503 02/11/2012   TSH 1.929 02/09/2012    Therapeutic Level Labs: No results found for: LITHIUM No results found for: VALPROATE No components found for:  CBMZ  Current Medications: Current Outpatient Medications  Medication Sig Dispense Refill  . amLODipine (NORVASC) 10 MG tablet Take 1 tablet (10 mg total) by mouth daily. 90 tablet 0  . aspirin EC 81 MG tablet Take 81 mg by mouth daily.    . B-D ULTRAFINE III SHORT PEN 31G X 8 MM MISC USE TO INJECT INSULIN ONCE DAILY 100 each 2  . cholecalciferol (VITAMIN D) 1000 UNITS tablet Take 1,000 Units by mouth daily.    . cyclobenzaprine (FLEXERIL) 10 MG tablet TAKE 1 TABLET BY MOUTH AT BEDTIME AS NEEDED FOR MUSCLE SPASMS 15 tablet 0  . diclofenac (VOLTAREN) 75 MG EC tablet Take 75 mg by mouth 2 (two) times daily as needed.    . Liraglutide (VICTOZA) 18 MG/3ML SOPN Inject 0.3 mLs (1.8 mg total) into the skin at bedtime. 18 pen 3  . Multiple Vitamins-Minerals (MULTIVITAMIN ADULTS 50+) TABS Take 1 tablet by mouth daily.    . potassium chloride SA (KLOR-CON M20) 20 MEQ tablet Take 1 tablet (20 mEq total) by mouth daily. 90 tablet 0  . simvastatin (ZOCOR) 20 MG tablet Take 1 tablet (20 mg total) by mouth at bedtime. 90 tablet 0  . triamcinolone cream (KENALOG) 0.1 % APPLY 1 APPLICATION TOPICALLY 2 (TWO) TIMES DAILY. (Patient taking differently: APPLY 1 APPLICATION TOPICALLY 2 (TWO)  TIMES DAILY. AS NEEDED) 30 g 0  . valsartan-hydrochlorothiazide (DIOVAN-HCT) 320-25 MG tablet Take 1 tablet by mouth daily. 90 tablet 0  . venlafaxine XR (EFFEXOR-XR) 75 MG 24 hr capsule TAKE THREE CAPSULES DAILY 270 capsule 1   No current facility-administered medications for this visit.      Musculoskeletal: Strength & Muscle  Tone: within normal limits Gait & Station: walks with cane Patient leans: N/A  Psychiatric Specialty Exam: Review of Systems  Psychiatric/Behavioral: Negative for depression. The patient is not nervous/anxious.   All other systems reviewed and are negative.   Blood pressure (!) 157/78, pulse (!) 121, temperature 98 F (36.7 C), temperature source Oral, height 5\' 7"  (1.702 m), weight (!) 499 lb 12.8 oz (226.7 kg).Body mass index is 78.28 kg/m.  General Appearance: Casual  Eye Contact:  Fair  Speech:  Clear and Coherent  Volume:  Normal  Mood:  Euthymic  Affect:  Congruent  Thought Process:  Goal Directed and Descriptions of Associations: Intact  Orientation:  Full (Time, Place, and Person)  Thought Content: Logical   Suicidal Thoughts:  No  Homicidal Thoughts:  No  Memory:  Immediate;   Fair Recent;   Fair Remote;   Fair  Judgement:  Fair  Insight:  Fair  Psychomotor Activity:  Normal  Concentration:  Concentration: Fair and Attention Span: Fair  Recall:  [employer] of Knowledge: Fair  Language: Fair  Akathisia:  No  Handed:  Right  AIMS (if indicated): na  Assets:  Communication Skills Desire for Improvement Social Support  ADL's:  Intact  Cognition: WNL  Sleep:  Fair   Screenings: PHQ2-9     Office Visit from 08/16/2016 in [facility] at [facility] Visit from 01/04/2016 in [place] at [facility] Visit from 01/27/2015 in [facility] at [facility] from 11/28/2014 in [facility] at [facility] Total Score  4  0  0  4  PHQ-9 Total Score  15  -  -  19       Assessment  and Plan: Saman is a 62 year old Caucasian male who has a history of depression, currently in remission, OSA, morbid obesity, multiple medical problems, presented to the clinic today for a follow-up visit.  Patient is currently doing well on the current medication regimen.  His depressive symptoms continues to be in remission.  Will continue plan as noted below.  Plan Depression in remission Continue Effexor 225 mg p.o. daily PHQ 9 equals 4  Discussed elevated blood pressure as well as heart rate with patient.  He is morbidly obese.  His weight is also increasing compared to last visit.  Patient reports he is motivated to start exercising more and has started watching his diet.  He will continue to follow-up with his primary medical doctor for his elevated blood pressure.  Follow-up in clinic in 5-6 months or sooner if needed.  More than 50 % of the time was spent for psychoeducation and supportive psychotherapy and care coordination.  This note was generated in part or whole with voice recognition software. Voice recognition is usually quite accurate but there are transcription errors that can and very often do occur. I apologize for any typographical errors that were not detected and corrected.         Ursula Alert, MD 04/05/2018, 5:38 PM

## 2018-05-04 ENCOUNTER — Other Ambulatory Visit: Payer: Self-pay | Admitting: Family Medicine

## 2018-05-28 ENCOUNTER — Other Ambulatory Visit: Payer: Self-pay | Admitting: Family Medicine

## 2018-05-29 ENCOUNTER — Other Ambulatory Visit: Payer: Self-pay | Admitting: Family Medicine

## 2018-06-01 ENCOUNTER — Encounter: Payer: Self-pay | Admitting: Family Medicine

## 2018-06-25 ENCOUNTER — Other Ambulatory Visit: Payer: Self-pay | Admitting: Family Medicine

## 2018-06-25 NOTE — Telephone Encounter (Signed)
Last office visit 11/15/2016.  Last labs 05/04/2017. He does have an appointment scheduled for 08/10/2018 for CPE.  Ok to refill until then?

## 2018-06-25 NOTE — Telephone Encounter (Signed)
Last office visit 11/15/2016.  He does have an appointment scheduled for 08/10/2018 for CPE.  Ok to refill until then?

## 2018-06-26 ENCOUNTER — Other Ambulatory Visit: Payer: Self-pay

## 2018-06-26 MED ORDER — POTASSIUM CHLORIDE CRYS ER 20 MEQ PO TBCR: 20.0000 meq | EXTENDED_RELEASE_TABLET | Freq: Every day | ORAL | 0 refills | Status: DC

## 2018-06-26 MED ORDER — VALSARTAN-HYDROCHLOROTHIAZIDE 320-25 MG PO TABS: 1.0000 | ORAL_TABLET | Freq: Every day | ORAL | 0 refills | Status: DC

## 2018-06-26 MED ORDER — AMLODIPINE BESYLATE 10 MG PO TABS: 10.0000 mg | ORAL_TABLET | Freq: Every day | ORAL | 0 refills | Status: DC

## 2018-06-26 MED ORDER — SIMVASTATIN 20 MG PO TABS: 20.0000 mg | ORAL_TABLET | Freq: Every day | ORAL | 0 refills | Status: DC

## 2018-06-26 NOTE — Telephone Encounter (Signed)
Last office visit 11/15/2016.  He does have an appointment scheduled for 08/10/2018 for CPE and he is requesting a 90 day supply to get him to his appointment. Ok to refill until then?

## 2018-07-17 NOTE — Progress Notes (Signed)
Cardiology Office Note Date:  07/19/2018  Patient ID:  [name], DOB [birth year], MRN [id number] PCP:  Jinny Sanders, MD  Cardiologist:  Dr. Fletcher Anon, MD    Chief Complaint: Cardiac DOT evaluation  History of Present Illness: Blake Garcia is a 62 y.o. male with history of hypertension, hyperlipidemia, diastolic dysfunction, chest pain, depression, and morbid obesity with OSA on CPAP who presents for DOT evaluation from a cardiac perspective.  Patient has previously been evaluated secondary to chest pain and dyspnea in 2016 with echo showing an EF of 50 to 55%, moderate concentric LVH, grade 1 diastolic dysfunction.  This was a very suboptimal study secondary to body habitus.  He has not undergone ischemic evaluation as his size has been felt to be prohibitive to getting good imaging and a reliable study.  He was last seen in the office in 09/2015 and at that time he reported no recurrence of chest pain.  He did report chronic dyspnea on exertion in the setting of morbid obesity and deconditioning.  He is followed closely by his PCP for his morbid obesity.  His blood pressure at his last office visit with [name] was a suboptimally controlled at 180/102 with improvement on repeat at 148/90.  Patient comes in doing reasonably well from a cardiac perspective.  He indicates he got his initial DOT certification 1 year prior.  Because of his comorbid conditions he was issued a 1 year card and is now due for renewal of this card.  Over this past year, he has been driving a school bus for [employer]  He presented to outside clinic for his DOT exam on 07/17/2018.  At that time, it was recommended he be evaluated by cardiology given his morbid obesity places him at increased risk for cardiovascular disease.  He comes in to our office today indicating he has not had any recent episodes of chest pain.  He does indicate he gets short of breath with ambulation and states he can ambulate approximately  300 feet.  His legs do feel weak with ambulation.  He ambulates with a cane.  He denies any exertional chest pain.  No palpitations, dizziness, presyncope, or syncope.  He is compliant with all medications as well as his CPAP.  Most recent labs from 05/2017 show an A1c of 6.0, LDL 116, potassium 3.7, serum creatinine 0.95, liver function normal.  He is scheduled to have fasting labs and CPE with his PCP in early 08/2018.  He does not have any issues or questions at this time.   Past Medical History:  Diagnosis Date  . Basal cell carcinoma [id number]   [place]  . Cellulitis 02/09/2012   [facility]  . Complication of anesthesia ~ 2000   "during OR for kidney stones; anesthesia RX made me code twice in one week"  . Depression   . Diastolic dysfunction    a. 11/2014 Echo: EF 50-55%, Gr 1 DD.  [name] History of blood transfusion 1958   "I was an Rh baby"  . Hyperlipidemia   . Hypertension   . Kidney stones   . Midsternal chest pain   . Morbid obesity ([place])   . OSA (obstructive sleep apnea)    "wear CPAP"  . Personal history of colonic adenoma 09/14/2012    Past Surgical History:  Procedure Laterality Date  . CHOLECYSTECTOMY  1993  . ESOPHAGOGASTRODUODENOSCOPY    . KNEE ARTHROSCOPY  ~ 2004   right  . LITHOTRIPSY  2000  Current Meds  Medication Sig  . amLODipine (NORVASC) 10 MG tablet Take 1 tablet (10 mg total) by mouth daily.  [name] aspirin EC 81 MG tablet Take 81 mg by mouth daily.  . B-D ULTRAFINE III SHORT PEN 31G X 8 MM MISC USE TO INJECT INSULIN ONCE DAILY  . cholecalciferol (VITAMIN D) 1000 UNITS tablet Take 1,000 Units by mouth daily.  . cyclobenzaprine (FLEXERIL) 10 MG tablet TAKE 1 TABLET BY MOUTH AT BEDTIME AS NEEDED FOR MUSCLE SPASMS  . diclofenac (VOLTAREN) 75 MG EC tablet Take 75 mg by mouth 2 (two) times daily as needed.  . Multiple Vitamins-Minerals (MULTIVITAMIN ADULTS 50+) TABS Take 1 tablet by mouth daily.  . potassium chloride SA (K-DUR,KLOR-CON) 20 MEQ tablet TAKE  ONE TABLET EVERY DAY  . simvastatin (ZOCOR) 20 MG tablet Take 1 tablet (20 mg total) by mouth at bedtime.  . triamcinolone cream (KENALOG) 0.1 % APPLY 1 APPLICATION TOPICALLY 2 (TWO) TIMES DAILY. (Patient taking differently: APPLY 1 APPLICATION TOPICALLY 2 (TWO) TIMES DAILY. AS NEEDED)  . valsartan-hydrochlorothiazide (DIOVAN-HCT) 320-25 MG tablet TAKE ONE TABLET EVERY DAY  . venlafaxine XR (EFFEXOR-XR) 75 MG 24 hr capsule TAKE THREE CAPSULES DAILY    Allergies:   Patient has no known allergies.   Social History:  The patient  reports that he has never smoked. He has never used smokeless tobacco. He reports that he does not drink alcohol or use drugs.   Family History:  The patient's family history includes ADD / ADHD in his daughter, son, and son; Cancer in his maternal aunt and maternal uncle; Coronary artery disease in his brother and brother; Diabetes in his brother; Heart disease in his brother, brother, father, and mother; Hypertension in his mother; Stroke in his father.  ROS:   Review of Systems  Constitutional: Positive for malaise/fatigue. Negative for chills, diaphoresis, fever and weight loss.  HENT: Negative for congestion.   Eyes: Negative for discharge and redness.  Respiratory: Positive for shortness of breath. Negative for cough, hemoptysis, sputum production and wheezing.   Cardiovascular: Positive for leg swelling. Negative for chest pain, palpitations, orthopnea, claudication and PND.  Gastrointestinal: Negative for abdominal pain, blood in stool, heartburn, melena, nausea and vomiting.  Genitourinary: Negative for hematuria.  Musculoskeletal: Negative for falls and myalgias.  Skin: Negative for rash.  Neurological: Positive for weakness. Negative for dizziness, tingling, tremors, sensory change, speech change, focal weakness and loss of consciousness.  Endo/Heme/Allergies: Does not bruise/bleed easily.  Psychiatric/Behavioral: Negative for substance abuse. The patient  is not nervous/anxious.   All other systems reviewed and are negative.    PHYSICAL EXAM:  VS:  BP 120/78 (BP Location: Left Arm, Patient Position: Sitting, Cuff Size: Large)   Pulse (!) 103   Ht 5\' 7"  (1.702 m)   Wt (!) 496 lb (225 kg)   BMI 77.68 kg/m  BMI: Body mass index is 77.68 kg/m.  Physical Exam  Constitutional: He is oriented to person, place, and time. He appears well-developed and well-nourished.  Morbidly obese  HENT:  Head: Normocephalic and atraumatic.  Eyes: Right eye exhibits no discharge. Left eye exhibits no discharge.  Neck: Normal range of motion. No JVD present.  JVD unable to be assessed secondary to body habitus  Cardiovascular: Normal rate, regular rhythm, S1 normal, S2 normal and normal heart sounds. Exam reveals no distant heart sounds, no friction rub, no midsystolic click and no opening snap.  No murmur heard. Pulses:      Posterior tibial pulses are  1+ on the right side and 1+ on the left side.  Distant heart sounds, likely in the setting of body habitus  Pulmonary/Chest: Effort normal and breath sounds normal. No respiratory distress. He has no decreased breath sounds. He has no wheezes. He has no rales. He exhibits no tenderness.  Abdominal: Soft. He exhibits no distension. There is no abdominal tenderness.  Musculoskeletal:        General: Edema present.     Comments: 1+ bilateral nonpitting edema with changes consistent with chronic woody appearance  Neurological: He is alert and oriented to person, place, and time.  Skin: Skin is warm and dry. No cyanosis. Nails show no clubbing.  Psychiatric: He has a normal mood and affect. His speech is normal and behavior is normal. Judgment and thought content normal.     EKG:  Was ordered and interpreted by me today. Shows sinus tachycardia, 103 bpm, LAD, RBBB (unchanged)  Recent Labs: No results found for requested labs within last 8760 hours.  No results found for requested labs within last 8760  hours.   CrCl cannot be calculated (Patient's most recent lab result is older than the maximum 21 days allowed.).   Wt Readings from Last 3 Encounters:  07/19/18 (!) 496 lb (225 kg)  11/15/16 (!) 471 lb 12 oz (214 kg)  09/20/16 (!) 486 lb (220.4 kg)     Other studies reviewed: Additional studies/records reviewed today include: summarized above  ASSESSMENT AND PLAN:  1. Cardiac DOT evaluation: I agree with the DOT medical examiner sending the patient to be evaluated from a cardiovascular perspective given his morbid obesity, as this places him at increased risk.  Unfortunately, we are unable to adequately evaluate the patient from an ischemic or functional standpoint as his weight/body habitus precludes adequate/reliable noninvasive cardiac imaging including treadmill Myoview, Lexiscan Myoview, cardiac CT, and cardiac catheterization.  The patient's weight of 496 pounds exceeds the weight limit of any available treadmill as well as the cardiac cath table.  The patient was made aware of this at his office visit.  I have discussed this issue with his DOT examiner and stated that because we cannot assess his cardiovascular or functional status we are unable to provide cardiovascular clearance for DOT card at this time.  I recommend he pursue aggressive risk factor modification including significant weight loss and optimal blood pressure and lipid control.  He would benefit from referral to bariatric surgery if this has not already been pursued.  2. Shortness of breath: This is likely multifactorial including morbid obesity and severe physical deconditioning.  This has been unchanged for numerous years.  Initially, we planned to pursue an echocardiogram as part of his DOT evaluation however, given that he cannot provide full cardiovascular clearance as outlined above and in the setting of his weight exceeding the weight limitations of the echo exam bed (reported 450 pounds) we will cancel his  echocardiogram at this time.  Should he become symptomatic, we could consider ordering an echocardiogram to be performed in the hospital with a bariatric bed.  Nonetheless, echo images would be quite challenging given his body habitus.  3. Morbid obesity: As above.  4. Hypertension: Blood pressure is well controlled today.  Remains on amlodipine and valsartan/HCTZ.  Followed by PCP.  5. Hyperlipidemia: Most recent LDL of 116 from 05/2017.  Remains on simvastatin.  Followed by PCP.  He reports he is due for fasting labs in early 08/2018.  Disposition: F/u with Dr. Fletcher Anon or an APP  in 6 months.  Current medicines are reviewed at length with the patient today.  The patient did not have any concerns regarding medicines.  Signed, Christell Faith, PA-C 07/19/2018 10:52 AM     [facility] [address] [place] [address] [place], [place] [address] [phone]

## 2018-07-19 ENCOUNTER — Ambulatory Visit: Payer: Self-pay | Admitting: Physician Assistant

## 2018-07-19 ENCOUNTER — Encounter: Payer: Self-pay | Admitting: Physician Assistant

## 2018-07-19 VITALS — BP 120/78 | HR 103 | Ht 67.0 in | Wt >= 6400 oz

## 2018-07-19 DIAGNOSIS — Z024 Encounter for examination for driving license: Secondary | ICD-10-CM

## 2018-07-19 DIAGNOSIS — R0602 Shortness of breath: Secondary | ICD-10-CM

## 2018-07-19 DIAGNOSIS — I10 Essential (primary) hypertension: Secondary | ICD-10-CM

## 2018-07-19 DIAGNOSIS — E782 Mixed hyperlipidemia: Secondary | ICD-10-CM

## 2018-07-19 DIAGNOSIS — Z6841 Body Mass Index (BMI) 40.0 and over, adult: Secondary | ICD-10-CM

## 2018-07-19 NOTE — Progress Notes (Signed)
Please notify patient that I discussed his case with his DOT medical examiner in detail.  Unfortunately, we are unable to provide the cardiac testing requested by the DOT medical examiner secondary to the patient's morbid obesity/body habitus.  We are unable to adequately and reliably assess for significant ischemic heart disease and functional status.  In this setting, we are unable to provide cardiac clearance for his DOT card.  I recommend he discuss with his PCP possible weight loss options and seeing a bariatric clinic if felt indicated.  I have completed his DOT form which indicates this and left it for him to pick up at his convenience.  In this setting, if the patient does not want to proceed with his echocardiogram, that is reasonable.

## 2018-07-19 NOTE — Patient Instructions (Signed)
Medication Instructions:  Your physician recommends that you continue on your current medications as directed. Please refer to the Current Medication list given to you today.  If you need a refill on your cardiac medications before your next appointment, please call your pharmacy.   Lab work: None ordered   If you have labs (blood work) drawn today and your tests are completely normal, you will receive your results only by: [name] MyChart Message (if you have MyChart) OR . A paper copy in the mail If you have any lab test that is abnormal or we need to change your treatment, we will call you to review the results.  Testing/Procedures: 1- Echocardiogram Your physician has requested that you have an echocardiogram. Echocardiography is a painless test that uses sound waves to create images of your heart. It provides your doctor with information about the size and shape of your heart and how well your heart's chambers and valves are working. This procedure takes approximately one hour. There are no restrictions for this procedure.    Follow-Up: At [facility], you and your health needs are our priority.  As part of our continuing mission to provide you with exceptional heart care, we have created designated Provider Care Teams.  These Care Teams include your primary Cardiologist (physician) and Advanced Practice Providers (APPs -  Physician Assistants and Nurse Practitioners) who all work together to provide you with the care you need, when you need it. You will need a follow up appointment in 6 months.  Please call our office 2 months in advance to schedule this appointment.  You may see Dr. Fletcher Anon or one of the following Advanced Practice Providers on your designated Care Team:   Murray Hodgkins, NP Christell Faith, PA-C . Marrianne Mood, PA-C

## 2018-07-20 ENCOUNTER — Telehealth: Payer: Self-pay | Admitting: Physician Assistant

## 2018-07-20 NOTE — Telephone Encounter (Signed)
Attempted to fax documents over to [facility] and received 3 transmission failures. Called and confirmed fax number with them and they said that we should just mail it to them. Confirmed mailing address listed on form and will send that out to them and also send form to patient. Copy made of form and placed in "Save" bin above Diann Bangerter's desk.

## 2018-07-20 NOTE — Telephone Encounter (Signed)
Spoke with patient and reviewed DOT form had been completed and that he was not cleared at this time. He thought it would be reviewed again after his echocardiogram but discussed that was not the case. Reviewed recommendations for weight loss along with blood pressure control and lipid control. He requested that I please mail his form to him and advised that I would also fax this over to [facility] as well. He verbalized understanding with no further questions at this time.

## 2018-07-20 NOTE — Progress Notes (Signed)
See telephone note.

## 2018-07-23 ENCOUNTER — Encounter: Payer: Self-pay | Admitting: Family Medicine

## 2018-08-03 ENCOUNTER — Other Ambulatory Visit: Payer: Self-pay | Admitting: Physician Assistant

## 2018-08-03 DIAGNOSIS — R0602 Shortness of breath: Secondary | ICD-10-CM

## 2018-08-08 ENCOUNTER — Other Ambulatory Visit (INDEPENDENT_AMBULATORY_CARE_PROVIDER_SITE_OTHER): Payer: Self-pay

## 2018-08-08 DIAGNOSIS — Z0000 Encounter for general adult medical examination without abnormal findings: Secondary | ICD-10-CM

## 2018-08-08 LAB — LIPID PANEL
CHOLESTEROL: 165 mg/dL (ref 0–200)
HDL: 58.8 mg/dL (ref >39.00)
LDL CALC: 87 mg/dL (ref 0–99)
NonHDL: 106.36
TRIGLYCERIDES: 97 mg/dL (ref 0.0–149.0)
Total CHOL/HDL Ratio: 3
VLDL: 19.4 mg/dL (ref 0.0–40.0)

## 2018-08-08 LAB — COMPREHENSIVE METABOLIC PANEL
ALT: 17 U/L (ref 0–53)
AST: 15 U/L (ref 0–37)
Albumin: 3.8 g/dL (ref 3.5–5.2)
Alkaline Phosphatase: 73 U/L (ref 39–117)
BUN: 17 mg/dL (ref 6–23)
CO2: 34 mEq/L — ABNORMAL HIGH (ref 19–32)
Calcium: 9.5 mg/dL (ref 8.4–10.5)
Chloride: 99 mEq/L (ref 96–112)
Creatinine, Ser: 1.01 mg/dL (ref 0.40–1.50)
GFR: 79.6 mL/min (ref >60.00)
Glucose, Bld: 126 mg/dL — ABNORMAL HIGH (ref 70–99)
Potassium: 3.8 mEq/L (ref 3.5–5.1)
Sodium: 141 mEq/L (ref 135–145)
Total Bilirubin: 0.4 mg/dL (ref 0.2–1.2)
Total Protein: 7 g/dL (ref 6.0–8.3)

## 2018-08-08 LAB — HEMOGLOBIN A1C: Hgb A1c MFr Bld: 6.5 % (ref 4.6–6.5)

## 2018-08-08 LAB — PSA: PSA: 2.06 ng/mL (ref 0.10–4.00)

## 2018-08-10 ENCOUNTER — Encounter: Payer: Self-pay | Admitting: Family Medicine

## 2018-08-10 ENCOUNTER — Ambulatory Visit: Payer: Self-pay | Admitting: Family Medicine

## 2018-08-10 VITALS — BP 132/80 | HR 125 | Temp 98.7°F | Ht 67.5 in | Wt >= 6400 oz

## 2018-08-10 DIAGNOSIS — I10 Essential (primary) hypertension: Secondary | ICD-10-CM

## 2018-08-10 DIAGNOSIS — F332 Major depressive disorder, recurrent severe without psychotic features: Secondary | ICD-10-CM

## 2018-08-10 DIAGNOSIS — Z6841 Body Mass Index (BMI) 40.0 and over, adult: Secondary | ICD-10-CM

## 2018-08-10 DIAGNOSIS — E118 Type 2 diabetes mellitus with unspecified complications: Secondary | ICD-10-CM | POA: Insufficient documentation

## 2018-08-10 DIAGNOSIS — Z1211 Encounter for screening for malignant neoplasm of colon: Secondary | ICD-10-CM

## 2018-08-10 DIAGNOSIS — Z0000 Encounter for general adult medical examination without abnormal findings: Secondary | ICD-10-CM

## 2018-08-10 DIAGNOSIS — Z23 Encounter for immunization: Secondary | ICD-10-CM

## 2018-08-10 DIAGNOSIS — I5032 Chronic diastolic (congestive) heart failure: Secondary | ICD-10-CM

## 2018-08-10 DIAGNOSIS — E782 Mixed hyperlipidemia: Secondary | ICD-10-CM

## 2018-08-10 LAB — HM DIABETES FOOT EXAM

## 2018-08-10 NOTE — Progress Notes (Signed)
Subjective:    Patient ID: Blake Garcia, male    DOB: 1955/10/10, 63 y.o.   MRN: [id number]  HPI   63 year old male presents for follow up on chronic health issues.   Unable to drive for DOT.[name] given cannot give cardiac clearance per cardiologist.. he now has no insurance and unable to afford bariatric surgery.   He has not been seen in  Almost 2 years. Last OV 10/2016 Over due for wellness visit as well.   Elevated Cholesterol: LDL at goal < 70.On simvastatin 20 mg daily. Lab Results  Component Value Date   CHOL 165 08/08/2018   HDL 58.80 08/08/2018   LDLCALC 87 08/08/2018   LDLDIRECT 153.9 09/10/2007   TRIG 97.0 08/08/2018   CHOLHDL 3 08/08/2018  Using medications without problems:none Muscle aches:none  Diet compliance: moderate Exercise: trying to walk more. Other complaints:  Diabetes:  New diagnosis of diabetes Lab Results  Component Value Date   HGBA1C 6.5 08/08/2018  Using medications without difficulties: Hypoglycemic episodes:? Hyperglycemic episodes:? Feet problems: no ulcers Blood Sugars averaging: not checking. eye exam within last year: due  Hypertension:  At goal on amlodipine, valsartan HCTZ   BP Readings from Last 3 Encounters:  08/10/18 132/80  07/19/18 120/78  11/15/16 140/80  Using medication without problems or lightheadedness: none Chest pain with exertion:none Edema: yes Short of breath: yes, when walking given weight Average home BPs: Other issues:   MDD: inadequate control on venlafaxine  Diastolic heart failure.. fluid overloaded.  Super morbid obesity  Has gained 2- lbs in last 2 years. Body mass index is 76.46 kg/m. Wt Readings from Last 3 Encounters:  08/10/18 (!) 495 lb 8 oz (224.8 kg)  07/19/18 (!) 496 lb (225 kg)  11/15/16 (!) 471 lb 12 oz (214 kg)    Blood pressure 132/80, pulse (!) 125, temperature 98.7 F (37.1 C), temperature source Oral, height 5' 7.5" (1.715 m), weight (!) 495 lb 8 oz (224.8 kg), SpO2 90  %. Social History /Family History/Past Medical History reviewed in detail and updated in EMR if needed.    Review of Systems  Constitutional: Positive for fatigue. Negative for fever.  HENT: Negative for ear pain.   Eyes: Negative for pain.  Respiratory: Negative for cough and shortness of breath.   Cardiovascular: Positive for leg swelling. Negative for chest pain and palpitations.  Gastrointestinal: Negative for abdominal pain.  Genitourinary: Negative for dysuria.  Musculoskeletal: Positive for arthralgias and gait problem.  Neurological: Negative for syncope, light-headedness and headaches.  Psychiatric/Behavioral: Negative for dysphoric mood.       Objective:   Physical Exam Constitutional:      General: He is not in acute distress.    Appearance: Normal appearance. He is well-developed. He is obese. He is not ill-appearing or toxic-appearing.  HENT:     Head: Normocephalic and atraumatic.     Right Ear: Hearing, tympanic membrane, ear canal and external ear normal.     Left Ear: Hearing, tympanic membrane, ear canal and external ear normal.     Nose: Nose normal.     Mouth/Throat:     Pharynx: Uvula midline.  Eyes:     General: Lids are normal. Lids are everted, no foreign bodies appreciated.     Conjunctiva/sclera: Conjunctivae normal.     Pupils: Pupils are equal, round, and reactive to light.  Neck:     Musculoskeletal: Normal range of motion and neck supple.     Thyroid: No thyroid mass  or thyromegaly.     Vascular: No carotid bruit.     Trachea: Trachea and phonation normal.  Cardiovascular:     Rate and Rhythm: Normal rate and regular rhythm.     Pulses: Normal pulses.     Heart sounds: S1 normal and S2 normal. No murmur. No gallop.   Pulmonary:     Breath sounds: Normal breath sounds. No wheezing, rhonchi or rales.  Abdominal:     General: Bowel sounds are normal.     Palpations: Abdomen is soft.     Tenderness: There is no abdominal tenderness. There is  no guarding or rebound.     Hernia: No hernia is present.  Lymphadenopathy:     Cervical: No cervical adenopathy.  Skin:    General: Skin is warm and dry.     Findings: No rash.  Neurological:     Mental Status: He is alert.     Cranial Nerves: No cranial nerve deficit.     Sensory: No sensory deficit.     Gait: Gait normal.     Deep Tendon Reflexes: Reflexes are normal and symmetric.  Psychiatric:        Speech: Speech normal.        Behavior: Behavior normal.        Judgment: Judgment normal.       Diabetic foot exam: Normal inspection No skin breakdown No calluses  Normal DP pulses Normal sensation to light touch and monofilament Nails normal     Assessment & Plan:  The patient's preventative maintenance and recommended screening tests for an annual wellness exam were reviewed in full today. Brought up to date unless services declined.  Counselled on the importance of diet, exercise, and its role in overall health and mortality. The patient's FH and SH was reviewed, including their home life, tobacco status, and drug and alcohol status.    Vaccines: given flu, otherwise uptodate Prostate Cancer Screen:  Stable, dad with pro cancer Lab Results  Component Value Date   PSA 2.06 08/08/2018   PSA 2.89 05/04/2017   PSA 1.71 12/30/2015  Colon Cancer Screen:  colonoscopy in 2009, repeat in 5 year      Smoking Status:none ETOH/ drug XBM:WUXL/KGMW  Hep C: done  HIV screen:   done

## 2018-08-10 NOTE — Assessment & Plan Note (Signed)
Stable fluid status... remains with severe peripheral edema mainly due to super obesity (uses compression hose).   Cannot do ECHO to re-eval given body habitus.

## 2018-08-10 NOTE — Assessment & Plan Note (Signed)
New diagnosis.   Reviewed standards of care.  Given flu and PNA vaccine today.  Due for eye exam.  Foot exam done.  Encouraged exercise, weight loss, healthy eating habits. Low carb diet, water exercise and aggressive weight loss recommended.

## 2018-08-10 NOTE — Assessment & Plan Note (Signed)
Moderate control on venlafaxine.. followed by psychiatry.

## 2018-08-10 NOTE — Assessment & Plan Note (Signed)
Pt cannot afford bariatric surgery.

## 2018-08-10 NOTE — Assessment & Plan Note (Signed)
LDL at goal on statin. 

## 2018-08-10 NOTE — Assessment & Plan Note (Signed)
Well controlled. Continue current medication.  

## 2018-08-10 NOTE — Patient Instructions (Addendum)
Please stop at the lab to pick up stool cards.  Work on weight loss, increase exercise and low carb diet.  Start with water exercise at [employer].  Set up yearly eye exam.

## 2018-08-15 ENCOUNTER — Encounter

## 2018-08-15 ENCOUNTER — Other Ambulatory Visit: Payer: Self-pay

## 2018-08-17 ENCOUNTER — Telehealth: Payer: Self-pay | Admitting: Physician Assistant

## 2018-08-17 NOTE — Telephone Encounter (Signed)
-----   Message from [place].Daleen Bo, RN sent at 08/17/2018  3:00 PM EST ----- Needing Echo scheduled from 12/19.

## 2018-08-17 NOTE — Telephone Encounter (Signed)
Spoke with patient .  He was told the echo would not be done at this time and was cancelled.  See ov note from [facility] 12/19 re: bari bed echo at hospital.  Patient aware we will call back if we decide he needs echo scheduled.

## 2018-08-21 NOTE — Telephone Encounter (Signed)
Skype with Lorriane Shire, Echo tech to conform that pt can have echo imaging done at [facility] location. Bed weight limit 600 lbs.  Sent to [facility] scheduling.

## 2018-08-23 ENCOUNTER — Telehealth: Payer: Self-pay

## 2018-08-23 NOTE — Telephone Encounter (Signed)
-----   Message from Rise Mu, PA-C sent at 08/23/2018  8:38 AM EST ----- Regarding: RE: pt needing Echo Patient was asymptomatic.  Technically, there is no indication for echocardiogram given we cannot fully assess his functional status.  Okay to cancel. ----- Message ----- From: Alroy Dust.Daleen Bo, RN Sent: 08/23/2018   8:24 AM EST To: Rise Mu, PA-C Subject: RE: pt needing Echo                            Sorry, I thought I wrote you back. We were able to work it out through [facility] st office. Their bed goes to 600 lbs and I sent it to them for scheduling.  I can retract the request if you don't feel it would benefit the patient.  Just let me know.  Isabelle Matt  ----- Message ----- From: Sindy Messing Sent: 08/21/2018   4:48 PM EST To: Alroy Dust.Carmeron Heady, RN Subject: RE: pt needing Echo                            We can cancel the echo. 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 The study can be cancelled.  Ryan ----- Message ----- From: Alroy Dust.Daleen Bo, RN Sent: 08/21/2018  10:55 AM EST To: Jeannette How, Rise Mu, PA-C Subject: pt needing Echo                                Pt had Echo ordered on 12/19, however we have been unable to schedule him due to needing a bariatric bed.  Our bed goes to 500 lb limit, however Dominica Severin and Missy felt that would be cutting it close (pt 496lb 8 oz at last OV).   I have called both [facility] and [facility] scheduling and both their beds have a 400 lb limit.  I am confident that he is not the first pt in this scenario, however both schedulers did not have a solution for [name]. Is there anything you suggest? Do we just try it on our bed and hope for the best.  I think Dominica Severin was concerned with liability and patient safety but I cannot speak for him.   Thanks, Johnney Scarlata

## 2018-08-23 NOTE — Telephone Encounter (Signed)
Called pt regarding request from provider to d/c echo. Pt verbalized understanding and had no further questions at this time.   Scheduling made aware.

## 2018-09-11 ENCOUNTER — Other Ambulatory Visit: Payer: Self-pay | Admitting: Psychiatry

## 2018-09-11 DIAGNOSIS — F3342 Major depressive disorder, recurrent, in full remission: Secondary | ICD-10-CM

## 2018-09-28 ENCOUNTER — Other Ambulatory Visit: Payer: Self-pay | Admitting: Family Medicine

## 2018-10-04 ENCOUNTER — Ambulatory Visit: Payer: Self-pay | Admitting: Psychiatry

## 2018-10-05 ENCOUNTER — Other Ambulatory Visit: Payer: Self-pay | Admitting: Family Medicine

## 2018-10-23 ENCOUNTER — Telehealth: Payer: Self-pay | Admitting: Cardiovascular Disease

## 2018-10-23 NOTE — Telephone Encounter (Signed)
Recieved request from : [employer]     Scanned to ROI on 3/11   Forwarded to [employer] for processing

## 2018-10-28 ENCOUNTER — Other Ambulatory Visit: Payer: Self-pay | Admitting: Family Medicine

## 2018-11-03 ENCOUNTER — Other Ambulatory Visit: Payer: Self-pay | Admitting: Family Medicine

## 2018-12-24 ENCOUNTER — Other Ambulatory Visit: Payer: Self-pay | Admitting: Family Medicine

## 2018-12-31 ENCOUNTER — Telehealth: Payer: Self-pay | Admitting: Family Medicine

## 2018-12-31 NOTE — Telephone Encounter (Signed)
Left message asking pt to call office please r/s 6/26 appointment with dr Diona Browner

## 2019-01-08 ENCOUNTER — Other Ambulatory Visit: Payer: Self-pay | Admitting: Family Medicine

## 2019-01-25 ENCOUNTER — Ambulatory Visit: Payer: Self-pay | Admitting: Family Medicine

## 2019-01-28 ENCOUNTER — Telehealth: Payer: Self-pay | Admitting: Family Medicine

## 2019-01-28 NOTE — Telephone Encounter (Signed)
Left message asking pt to call office  See if pt can do virtual appointment  If so change appointment note  TEXT if he wants smart phone put phone number  EMAIL if computer lap time or Ipad  Put email address  If pt wants in office Put IN OFFICE and covid screening

## 2019-01-29 ENCOUNTER — Other Ambulatory Visit: Payer: Self-pay

## 2019-01-29 ENCOUNTER — Encounter: Payer: Self-pay | Admitting: Family Medicine

## 2019-01-29 ENCOUNTER — Ambulatory Visit: Payer: Self-pay | Admitting: Family Medicine

## 2019-01-29 VITALS — BP 130/80 | HR 98 | Temp 97.6°F | Ht 67.5 in | Wt >= 6400 oz

## 2019-01-29 DIAGNOSIS — E118 Type 2 diabetes mellitus with unspecified complications: Secondary | ICD-10-CM

## 2019-01-29 DIAGNOSIS — I10 Essential (primary) hypertension: Secondary | ICD-10-CM

## 2019-01-29 DIAGNOSIS — R0902 Hypoxemia: Secondary | ICD-10-CM

## 2019-01-29 DIAGNOSIS — Z6841 Body Mass Index (BMI) 40.0 and over, adult: Secondary | ICD-10-CM

## 2019-01-29 DIAGNOSIS — I5032 Chronic diastolic (congestive) heart failure: Secondary | ICD-10-CM

## 2019-01-29 DIAGNOSIS — E662 Morbid (severe) obesity with alveolar hypoventilation: Secondary | ICD-10-CM

## 2019-01-29 DIAGNOSIS — R0609 Other forms of dyspnea: Secondary | ICD-10-CM

## 2019-01-29 DIAGNOSIS — R0602 Shortness of breath: Secondary | ICD-10-CM

## 2019-01-29 LAB — T4, FREE: Free T4: 0.7 ng/dL (ref 0.60–1.60)

## 2019-01-29 LAB — POCT GLYCOSYLATED HEMOGLOBIN (HGB A1C): Hemoglobin A1C: 6.4 % — AB (ref 4.0–5.6)

## 2019-01-29 LAB — COMPREHENSIVE METABOLIC PANEL
ALT: 17 U/L (ref 0–53)
AST: 16 U/L (ref 0–37)
Albumin: 4.2 g/dL (ref 3.5–5.2)
Alkaline Phosphatase: 88 U/L (ref 39–117)
BUN: 17 mg/dL (ref 6–23)
CO2: 37 mEq/L — ABNORMAL HIGH (ref 19–32)
Calcium: 9.7 mg/dL (ref 8.4–10.5)
Chloride: 97 mEq/L (ref 96–112)
Creatinine, Ser: 0.99 mg/dL (ref 0.40–1.50)
GFR: 76.52 mL/min (ref >60.00)
Glucose, Bld: 130 mg/dL — ABNORMAL HIGH (ref 70–99)
Potassium: 3.8 mEq/L (ref 3.5–5.1)
Sodium: 141 mEq/L (ref 135–145)
Total Bilirubin: 0.5 mg/dL (ref 0.2–1.2)
Total Protein: 7.5 g/dL (ref 6.0–8.3)

## 2019-01-29 LAB — CBC WITH DIFFERENTIAL/PLATELET
Basophils Absolute: 0 10*3/uL (ref 0.0–0.1)
Basophils Relative: 0.2 % (ref 0.0–3.0)
Eosinophils Absolute: 0.1 10*3/uL (ref 0.0–0.7)
Eosinophils Relative: 1.5 % (ref 0.0–5.0)
HCT: 49.8 % (ref 39.0–52.0)
Hemoglobin: 16.4 g/dL (ref 13.0–17.0)
Lymphocytes Relative: 11.6 % — ABNORMAL LOW (ref 12.0–46.0)
Lymphs Abs: 1.1 10*3/uL (ref 0.7–4.0)
MCHC: 32.8 g/dL (ref 30.0–36.0)
MCV: 91.2 fl (ref 78.0–100.0)
Monocytes Absolute: 0.6 10*3/uL (ref 0.1–1.0)
Monocytes Relative: 6.7 % (ref 3.0–12.0)
Neutro Abs: 7.6 10*3/uL (ref 1.4–7.7)
Neutrophils Relative %: 80 % — ABNORMAL HIGH (ref 43.0–77.0)
Platelets: 225 10*3/uL (ref 150.0–400.0)
RBC: 5.46 Mil/uL (ref 4.22–5.81)
RDW: 14.9 % (ref 11.5–15.5)
WBC: 9.5 10*3/uL (ref 4.0–10.5)

## 2019-01-29 LAB — TSH: TSH: 2.8 u[IU]/mL (ref 0.35–4.50)

## 2019-01-29 LAB — T3, FREE: T3, Free: 3.3 pg/mL (ref 2.3–4.2)

## 2019-01-29 NOTE — Progress Notes (Signed)
Chief Complaint  Patient presents with  . Diabetes  . Shortness of Breath    x 3 months    History of Present Illness: HPI   63 year old morbidly obese male presents for DM as well as 3 months of shortness of breath.  He reports gradually worsening SOB... any walking makes him SOB and fatigued. He has noted this with the additional weight gain. No SOB at rest. No chest pain. Stable swelling in ankles... moderate No fever, no cough.  He has gained 30 lbs in last few months as well...given lifestyle changes with [id number]. He is now disabled and not working.   His heart rate is 128 bpm His oxygen in office today is low at 86%.  Wt Readings from Last 3 Encounters:  01/29/19 (!) 524 lb 2 oz (237.7 kg)  08/10/18 (!) 495 lb 8 oz (224.8 kg)  07/19/18 (!) 496 lb (225 kg)     Blood pressure well controlled. BP Readings from Last 3 Encounters:  01/29/19 130/80  08/10/18 132/80  07/19/18 120/78    Diabetes:  No longer on victoza.. this was working well for him. He no longer has insurance but would like to restart. Lab Results  Component Value Date   HGBA1C 6.4 (A) 01/29/2019  Using medications without difficulties:none Hypoglycemic episodes: Hyperglycemic episodes: Feet problems: none Blood Sugars averaging:not checking. eye exam within last year: overdue.. cannot afford  COVID 19 screen No recent travel or known exposure to [place] The patient denies respiratory symptoms of COVID 19 at this time.  The importance of social distancing was discussed today.   Review of Systems  Respiratory: Positive for shortness of breath. Negative for cough, hemoptysis, sputum production and wheezing.   Cardiovascular: Positive for palpitations and leg swelling. Negative for chest pain, orthopnea and claudication.      Past Medical History:  Diagnosis Date  . Basal cell carcinoma [id number]   [place]  . Cellulitis 02/09/2012   RLE  . Complication of anesthesia ~ 2000   "during OR for kidney stones; anesthesia RX made me code twice in one week"  . Depression   . Diastolic dysfunction    a. 11/2014 Echo: EF 50-55%, Gr 1 DD.  [name] History of blood transfusion 1958   "I was an Rh baby"  . Hyperlipidemia   . Hypertension   . Kidney stones   . Midsternal chest pain   . Morbid obesity ([place])   . OSA (obstructive sleep apnea)    "wear CPAP"  . Personal history of colonic adenoma 09/14/2012    reports that he has never smoked. He has never used smokeless tobacco. He reports that he does not drink alcohol or use drugs.   Current Outpatient Medications:  .  amLODipine (NORVASC) 10 MG tablet, TAKE ONE TABLET EVERY DAY, Disp: 90 tablet, Rfl: 1 .  aspirin EC 81 MG tablet, Take 81 mg by mouth daily., Disp: , Rfl:  .  cholecalciferol (VITAMIN D) 1000 UNITS tablet, Take 1,000 Units by mouth daily., Disp: , Rfl:  .  cyclobenzaprine (FLEXERIL) 10 MG tablet, TAKE 1 TABLET BY MOUTH AT BEDTIME AS NEEDED FOR MUSCLE SPASMS, Disp: 15 tablet, Rfl: 0 .  diclofenac (VOLTAREN) 75 MG EC tablet, Take 75 mg by mouth 2 (two) times daily as needed., Disp: , Rfl:  .  Multiple Vitamins-Minerals (MULTIVITAMIN ADULTS 50+) TABS, Take 1 tablet by mouth daily., Disp: , Rfl:  .  potassium chloride SA (K-DUR) 20 MEQ tablet, TAKE ONE TABLET  BY MOUTH EVERY DAY, Disp: 90 tablet, Rfl: 1 .  simvastatin (ZOCOR) 20 MG tablet, TAKE ONE TABLET BY MOUTH AT BEDTIME, Disp: 90 tablet, Rfl: 3 .  triamcinolone cream (KENALOG) 0.1 %, APPLY 1 APPLICATION TOPICALLY 2 (TWO) TIMES DAILY. (Patient taking differently: APPLY 1 APPLICATION TOPICALLY 2 (TWO) TIMES DAILY. AS NEEDED), Disp: 30 g, Rfl: 0 .  valsartan-hydrochlorothiazide (DIOVAN-HCT) 320-25 MG tablet, TAKE ONE TABLET BY MOUTH EVERY DAY, Disp: 90 tablet, Rfl: 1 .  venlafaxine XR (EFFEXOR-XR) 75 MG 24 hr capsule, TAKE THREE CAPSULES DAILY, Disp: 270 capsule, Rfl: 1   Observations/Objective: Blood pressure 130/80, pulse (!) 128, temperature 97.6 F (36.4 C),  temperature source Temporal, height 5' 7.5" (1.715 m), weight (!) 524 lb 2 oz (237.7 kg), SpO2 (!) 86 %.  Physical Exam Constitutional:      General: He is not in acute distress.    Appearance: Normal appearance. He is well-developed. He is obese. He is not ill-appearing or toxic-appearing.  HENT:     Head: Normocephalic and atraumatic.     Right Ear: Hearing, tympanic membrane, ear canal and external ear normal.     Left Ear: Hearing, tympanic membrane, ear canal and external ear normal.     Nose: Nose normal.     Mouth/Throat:     Pharynx: Uvula midline.  Eyes:     General: Lids are normal. Lids are everted, no foreign bodies appreciated.     Conjunctiva/sclera: Conjunctivae normal.     Pupils: Pupils are equal, round, and reactive to light.  Neck:     Musculoskeletal: Normal range of motion and neck supple.     Thyroid: No thyroid mass or thyromegaly.     Vascular: No carotid bruit.     Trachea: Trachea and phonation normal.  Cardiovascular:     Rate and Rhythm: Regular rhythm. Tachycardia present.     Pulses: Normal pulses.     Heart sounds: S1 normal and S2 normal. Heart sounds are distant. No murmur. No gallop.   Pulmonary:     Breath sounds: No stridor or transmitted upper airway sounds. Decreased breath sounds present. No wheezing, rhonchi or rales.  Abdominal:     General: Bowel sounds are normal.     Palpations: Abdomen is soft.     Tenderness: There is no abdominal tenderness. There is no guarding or rebound.     Hernia: No hernia is present.  Musculoskeletal:     Right lower leg: 1+ Pitting Edema present.     Left lower leg: 1+ Pitting Edema present.  Lymphadenopathy:     Cervical: No cervical adenopathy.  Skin:    General: Skin is warm and dry.     Findings: No rash.  Neurological:     Mental Status: He is alert.     Cranial Nerves: No cranial nerve deficit.     Sensory: No sensory deficit.     Gait: Gait normal.     Deep Tendon Reflexes: Reflexes are normal  and symmetric.  Psychiatric:        Speech: Speech normal.        Behavior: Behavior normal.        Judgment: Judgment normal.      Assessment and Plan Diastolic heart failure Liek[place] contributing to SOB... mild fluid overload despite current medication regimen.  Dyspnea on exertion Likely multifactorial in part due to super morbid obesity. EKG unremarkable.  Cannot do CXR in office given weight limits. No clear sign of DVT/PE Eval with labs  to check for anemia, thyroid etc. No clear infectious symptoms. May need pulmonary referral , possible CT chest, repeat ECHO etc.  Given new oxygen requirement... set up DME oxygen continuous 2 L [place]  Hypertension Well controlled. Continue current medication.   Hypoventilation associated with obesity ([place]) At least in part causing pt SOB.  Controlled type 2 diabetes mellitus with complication, without long-term current use of insulin (H[facility]) Well controlled on no med currently.  Morbid obesity with BMI of 70 and over, adult [facility]) If able to afford.. will restart victoza or trulicity for weight loss. Pt not interested in bariatric referral. Encouraged exercise, weight loss, healthy eating habits.      New oxygen requirement: 1)  2L via nasal cannula   (2)Saturation at rest on room air: 91%.   chronic respiratory diagnosis requiring him to need oxygen: Hypoventilation associated with obesity  L89.3  diastolic heart failure I 73.4    Patient requires oxygen due to super morbid obesity with BMI 80 resulting in hypoventilation, diastolic heart failure, shortness of breath, dyspnea on exertion. He has previously tried  ARB, diuretic and weight loss but those are no longer effective.   Eliezer Lofts, MD

## 2019-01-31 DIAGNOSIS — E662 Morbid (severe) obesity with alveolar hypoventilation: Secondary | ICD-10-CM | POA: Insufficient documentation

## 2019-01-31 NOTE — Assessment & Plan Note (Addendum)
Likely multifactorial in part due to super morbid obesity. EKG unremarkable.  Cannot do CXR in office given weight limits. No clear sign of DVT/PE Eval with labs to check for anemia, thyroid etc. No clear infectious symptoms. May need pulmonary referral , possible CT chest, repeat ECHO etc.  Given new oxygen requirement... set up DME oxygen continuous 2 L [place]

## 2019-01-31 NOTE — Addendum Note (Signed)
Addended by: Carter Kitten on: 01/31/2019 12:30 PM   Modules accepted: Orders

## 2019-01-31 NOTE — Assessment & Plan Note (Signed)
Liekly contributing to SOB... mild fluid overload despite current medication regimen.

## 2019-01-31 NOTE — Assessment & Plan Note (Signed)
Well controlled. Continue current medication.  

## 2019-01-31 NOTE — Assessment & Plan Note (Signed)
At least in part causing pt SOB.

## 2019-01-31 NOTE — Assessment & Plan Note (Signed)
Well controlled on no med currently.

## 2019-01-31 NOTE — Progress Notes (Signed)
at rest on room air =90% if this one is over 88 we must have the other 2 below  on room air on exertion= 86%  on 2lpm o2 on exertion= 93%

## 2019-01-31 NOTE — Assessment & Plan Note (Signed)
If able to afford.. will restart victoza or trulicity for weight loss. Pt not interested in bariatric referral. Encouraged exercise, weight loss, healthy eating habits.

## 2019-02-05 ENCOUNTER — Telehealth: Payer: Self-pay | Admitting: Family Medicine

## 2019-02-05 NOTE — Telephone Encounter (Signed)
Completed second section.[name] sorry this looked like a separate device order.

## 2019-02-05 NOTE — Telephone Encounter (Signed)
Best number [phone] [facility] @ [employer].   Will fax paperwork back  She needs the bottom part of the form filled out.  She stated to initial and date the corrections and fax back

## 2019-02-05 NOTE — Telephone Encounter (Signed)
Form placed back in Dr. Rometta Emery in box to complete section 2.

## 2019-02-05 NOTE — Telephone Encounter (Signed)
Form re-faxed to [employer] at [phone].

## 2019-02-08 ENCOUNTER — Telehealth: Payer: Self-pay

## 2019-02-08 NOTE — Telephone Encounter (Signed)
Left voicemail message informing patient that Dr. Fletcher Anon reviewed the EKG performed at Dr. Rometta Emery office. Per Dr. Fletcher Anon he can push his F/U appt out for another 6 months if he is not having any cardiac symptoms. 6 month recall placed. Advised patient to call back if he any other questions or concerns.

## 2019-02-08 NOTE — Telephone Encounter (Signed)
EKG is unchanged.  If he does not have a change in his cardiac symptoms, he can follow-up in 6 months from now.

## 2019-02-08 NOTE — Telephone Encounter (Signed)
Patient is due for 6 month F/U. He is self pay. He states he just saw his PCP, Dr. Diona Browner, and an EKG was performed at that visit. Patient would like to know if it can be reviewed to see if he can move his F/U to a later date or if he needs to schedule now. Patient is aware that he will receive a 55% discount for being self-pay and may also complete a financial assistance application through [facility] for a discount. Please advise.

## 2019-02-08 NOTE — Telephone Encounter (Signed)
EKG is viewable in Epic  EKG shows. Sinus  Tachycardia HR 102bpm -Right bundle branch block with left axis -bifascicular block.  EKG is similar to the pt prior. Will route to Dr.Arida to determine an appropriate f/u time frame.

## 2019-03-25 ENCOUNTER — Other Ambulatory Visit: Payer: Self-pay | Admitting: Psychiatry

## 2019-03-25 ENCOUNTER — Other Ambulatory Visit: Payer: Self-pay | Admitting: Family Medicine

## 2019-03-25 DIAGNOSIS — F3342 Major depressive disorder, recurrent, in full remission: Secondary | ICD-10-CM

## 2019-03-26 MED ORDER — VENLAFAXINE HCL ER 75 MG PO CP24: ORAL_CAPSULE | ORAL | 0 refills | Status: DC

## 2019-03-27 ENCOUNTER — Other Ambulatory Visit: Payer: Self-pay | Admitting: Family Medicine

## 2019-05-21 ENCOUNTER — Encounter: Payer: Self-pay | Admitting: Family Medicine

## 2019-05-21 ENCOUNTER — Telehealth: Payer: Self-pay

## 2019-05-21 ENCOUNTER — Ambulatory Visit: Payer: Self-pay | Admitting: Family Medicine

## 2019-05-21 ENCOUNTER — Other Ambulatory Visit: Payer: Self-pay

## 2019-05-21 ENCOUNTER — Ambulatory Visit (INDEPENDENT_AMBULATORY_CARE_PROVIDER_SITE_OTHER): Payer: Self-pay | Admitting: Family Medicine

## 2019-05-21 VITALS — BP 137/97 | HR 101 | Temp 97.9°F | Ht 67.5 in

## 2019-05-21 DIAGNOSIS — F332 Major depressive disorder, recurrent severe without psychotic features: Secondary | ICD-10-CM

## 2019-05-21 DIAGNOSIS — Z6841 Body Mass Index (BMI) 40.0 and over, adult: Secondary | ICD-10-CM

## 2019-05-21 DIAGNOSIS — L03115 Cellulitis of right lower limb: Secondary | ICD-10-CM | POA: Insufficient documentation

## 2019-05-21 DIAGNOSIS — E118 Type 2 diabetes mellitus with unspecified complications: Secondary | ICD-10-CM

## 2019-05-21 DIAGNOSIS — E662 Morbid (severe) obesity with alveolar hypoventilation: Secondary | ICD-10-CM

## 2019-05-21 MED ORDER — CEPHALEXIN 500 MG PO CAPS: 500.0000 mg | ORAL_CAPSULE | Freq: Four times a day (QID) | ORAL | 0 refills | Status: DC

## 2019-05-21 NOTE — Patient Instructions (Addendum)
Complete antibiotics x 7 days.  Cal to set up virtual follow up in 7 days.  Call if not improving in 48-72 hours, sooner if worsening.  Go to ER if pain, not tolerating antibiotic or measured fever.  Elevate leg above heart.   Also set up CPX with fasting albs prior in 08/2019

## 2019-05-21 NOTE — Progress Notes (Signed)
VIRTUAL VISIT Due to national recommendations of social distancing due to [name] 19, a virtual visit is felt to be most appropriate for this patient at this time.   I connected with the patient on 05/21/19 at  8:20 AM EDT by virtual telehealth platform and verified that I am speaking with the correct person using two identifiers.   I discussed the limitations, risks, security and privacy concerns of performing an evaluation and management service by  virtual telehealth platform and the availability of in person appointments. I also discussed with the patient that there may be a patient responsible charge related to this service. The patient expressed understanding and agreed to proceed.  Patient location: Home Provider Location: [facility] Participants: Blake Garcia and [name]   Chief Complaint  Patient presents with  . Follow-up    Mood  . Rash    Right Leg    History of Present Illness: 63 year old male presents for follow up hypoxia with 2  issues  1. Mood issue:  He has had some worsening of stress with Covid and daughter going to college. Not interested in counseling at this time.  2.rash on right leg: hx of cellulitis in past. Has chronic edema in bilateral legs due ot obesity.  2 weeks ago had bug bite on right anterior leg... cleaned and applied antibitoic ointment and hydrocortisone cream. Has  Noted redness spreading around site in last 2-3 days. Has noted new blistering. The rash is 2 inches by3 inches. Area is painful. No measured fever... but feeling warm and chills off and on.  no pus pocket.   no history of MRSA, no healthcare exposure No family history of MRSA.  3. Hypoxia likely due to morbid obesity: Now on oxygen.  Followed up with cardiology.. cannot do a  ECHO given obesity.  4. Unable to afford victoza for weight. He is uninsured but is looking into it.  COVID 19 screen No recent travel or known exposure to COVID19 The patient denies  respiratory symptoms of COVID 19 at this time.  The importance of social distancing was discussed today.   Review of Systems  Constitutional: Negative for chills and fever.  HENT: Negative for congestion and ear pain.   Eyes: Negative for pain and redness.  Respiratory: Negative for cough and shortness of breath.   Cardiovascular: Negative for chest pain, palpitations and leg swelling.  Gastrointestinal: Negative for abdominal pain, blood in stool, constipation, diarrhea, nausea and vomiting.  Genitourinary: Negative for dysuria.  Musculoskeletal: Negative for falls and myalgias.  Skin: Negative for rash.  Neurological: Negative for dizziness.  Psychiatric/Behavioral: Negative for depression. The patient is not nervous/anxious.       Past Medical History:  Diagnosis Date  . Basal cell carcinoma [id number]   [place]  . Cellulitis 02/09/2012   RLE  . Complication of anesthesia ~ 2000   "during OR for kidney stones; anesthesia RX made me code twice in one week"  . Depression   . Diastolic dysfunction    a. 11/2014 Echo: EF 50-55%, Gr 1 DD.  [name] History of blood transfusion 1958   "I was an Rh baby"  . Hyperlipidemia   . Hypertension   . Kidney stones   . Midsternal chest pain   . Morbid obesity ([place])   . OSA (obstructive sleep apnea)    "wear CPAP"  . Personal history of colonic adenoma 09/14/2012    reports that he has never smoked. He has never  used smokeless tobacco. He reports that he does not drink alcohol or use drugs.   Current Outpatient Medications:  .  amLODipine (NORVASC) 10 MG tablet, TAKE ONE TABLET EVERY DAY, Disp: 90 tablet, Rfl: 1 .  aspirin EC 81 MG tablet, Take 81 mg by mouth daily., Disp: , Rfl:  .  cholecalciferol (VITAMIN D) 1000 UNITS tablet, Take 1,000 Units by mouth daily., Disp: , Rfl:  .  cyclobenzaprine (FLEXERIL) 10 MG tablet, TAKE 1 TABLET BY MOUTH AT BEDTIME AS NEEDED FOR MUSCLE SPASMS, Disp: 15 tablet, Rfl: 0 .  diclofenac (VOLTAREN) 75  MG EC tablet, Take 75 mg by mouth 2 (two) times daily as needed., Disp: , Rfl:  .  Multiple Vitamins-Minerals (MULTIVITAMIN ADULTS 50+) TABS, Take 1 tablet by mouth daily., Disp: , Rfl:  .  potassium chloride SA (K-DUR) 20 MEQ tablet, TAKE ONE TABLET BY MOUTH EVERY DAY, Disp: 90 tablet, Rfl: 1 .  simvastatin (ZOCOR) 20 MG tablet, TAKE ONE TABLET BY MOUTH AT BEDTIME, Disp: 90 tablet, Rfl: 3 .  triamcinolone cream (KENALOG) 0.1 %, APPLY 1 APPLICATION TOPICALLY 2 (TWO) TIMES DAILY. (Patient taking differently: APPLY 1 APPLICATION TOPICALLY 2 (TWO) TIMES DAILY. AS NEEDED), Disp: 30 g, Rfl: 0 .  valsartan-hydrochlorothiazide (DIOVAN-HCT) 320-25 MG tablet, TAKE ONE TABLET EVERY DAY, Disp: 90 tablet, Rfl: 1 .  venlafaxine XR (EFFEXOR-XR) 75 MG 24 hr capsule, 3 capsules po daily, Disp: 270 capsule, Rfl: 0   Observations/Objective: Blood pressure (!) 137/97, pulse (!) 101, temperature 97.9 F (36.6 C), temperature source Oral, height 5' 7.5" (1.715 m), SpO2 92 %.  Physical Exam Constitutional:      Appearance: He is well-developed.  HENT:     Head: Normocephalic.     Right Ear: Hearing normal.     Left Ear: Hearing normal.     Nose: Nose normal.  Neck:     Thyroid: No thyroid mass or thyromegaly.     Vascular: No carotid bruit.     Trachea: Trachea normal.  Cardiovascular:     Rate and Rhythm: Normal rate and regular rhythm.     Pulses: Normal pulses.     Heart sounds: Heart sounds not distant. No murmur. No friction rub. No gallop.      Comments: No peripheral edema Pulmonary:     Effort: Pulmonary effort is normal. No respiratory distress.     Breath sounds: Normal breath sounds.  Skin:    General: Skin is warm and dry.     Findings: No rash.  Psychiatric:        Speech: Speech normal.        Behavior: Behavior normal.        Thought Content: Thought content normal.      Assessment and Plan Major depressive disorder, recurrent episode, severe ([place]) Well controlled. Continue  current medication. Offered counseling. Refused.  Morbid obesity with BMI of 70 and over, adult ([place]) Encouraged exercise, weight loss, healthy eating habits. Cannot afford medication to assist at this time... he is in process of getting insurance.  Hypoventilation associated with obesity (HCC) Likely cause of hypoxia. Pt refused referral to pulmonary at this time given cost.  Cellulitis of right lower leg Treat with keflex as low risk for MRSA. Follow up in 1 week.     I discussed the assessment and treatment plan with the patient. The patient was provided an opportunity to ask questions and all were answered. The patient agreed with the plan and demonstrated an understanding of the  instructions.   The patient was advised to call back or seek an in-person evaluation if the symptoms worsen or if the condition fails to improve as anticipated.     Blake Lofts, MD

## 2019-05-21 NOTE — Telephone Encounter (Signed)
Pt has already had virtual appt this morning with Dr Diona Browner.

## 2019-05-21 NOTE — Assessment & Plan Note (Signed)
Encouraged exercise, weight loss, healthy eating habits. Cannot afford medication to assist at this time... he is in process of getting insurance.

## 2019-05-21 NOTE — Telephone Encounter (Signed)
[place] Night - Client Nonclinical Telephone Record AccessNurse Client [facility] Night - Client Client Site [place] - Night Physician Eliezer Lofts - MD Contact Type Call Who Is Calling Patient / Member / Family / Caregiver Caller Name Devine Scurto Caller Phone Number [phone] Patient Name Blake Garcia Patient DOB 09/26/1955 Call Type Message Only Information Provided Reason for Call Request to Reschedule Office Appointment Initial Comment Needs to reschedule his appt for today @ 8:20am, woke up with a fever. Additional Comment Call Closed By: Donato Heinz Transaction Date/Time: 05/21/2019 7:20:56 AM (ET)

## 2019-05-21 NOTE — Assessment & Plan Note (Signed)
Well controlled. Continue current medication. Offered counseling. Refused.

## 2019-05-21 NOTE — Assessment & Plan Note (Signed)
Treat with keflex as low risk for MRSA. Follow up in 1 week.

## 2019-05-21 NOTE — Assessment & Plan Note (Signed)
Likely cause of hypoxia. Pt refused referral to pulmonary at this time given cost.

## 2019-05-28 ENCOUNTER — Encounter: Payer: Self-pay | Admitting: Family Medicine

## 2019-05-28 ENCOUNTER — Ambulatory Visit (INDEPENDENT_AMBULATORY_CARE_PROVIDER_SITE_OTHER): Payer: Self-pay | Admitting: Family Medicine

## 2019-05-28 DIAGNOSIS — L03115 Cellulitis of right lower limb: Secondary | ICD-10-CM

## 2019-05-28 MED ORDER — TRIAMCINOLONE ACETONIDE 0.1 % EX CREA: TOPICAL_CREAM | CUTANEOUS | 0 refills | Status: DC

## 2019-05-28 MED ORDER — CEPHALEXIN 500 MG PO CAPS: 500.0000 mg | ORAL_CAPSULE | Freq: Four times a day (QID) | ORAL | 0 refills | Status: DC

## 2019-05-28 NOTE — Progress Notes (Signed)
VIRTUAL VISIT Due to national recommendations of social distancing due to [place] 19, a virtual visit is felt to be most appropriate for this patient at this time.   I connected with the patient on 05/28/19 at  9:40 AM EDT by virtual telehealth platform and verified that I am speaking with the correct person using two identifiers.   I discussed the limitations, risks, security and privacy concerns of performing an evaluation and management service by  virtual telehealth platform and the availability of in person appointments. I also discussed with the patient that there may be a patient responsible charge related to this service. The patient expressed understanding and agreed to proceed.  Patient location: Home Provider Location: [facility] Participants: Eliezer Lofts and [name]   Chief Complaint  Patient presents with  . Follow-up    Cellulits Right Lower Leg    History of Present Illness: 63 year old male with morbid obesity presetns virtually for follow up right lower leg cellultitis 1 week after starting antibitoics.   He reports less redness, less swelling. N o pain.  No fever, no flu like symptoms.  50-60 % percent better.   Triamcinolone and benadryl for itching. Needs refill. He is elevating the leg.  COVID 19 screen No recent travel or known exposure to COVID19 The patient denies respiratory symptoms of COVID 19 at this time.  The importance of social distancing was discussed today.   Review of Systems  Constitutional: Negative for chills and fever.  HENT: Negative for congestion and ear pain.   Eyes: Negative for pain and redness.  Respiratory: Negative for cough and shortness of breath.   Cardiovascular: Negative for chest pain, palpitations and leg swelling.  Gastrointestinal: Negative for abdominal pain, blood in stool, constipation, diarrhea, nausea and vomiting.  Genitourinary: Negative for dysuria.  Musculoskeletal: Negative for falls and  myalgias.  Skin: Negative for rash.  Neurological: Negative for dizziness.  Psychiatric/Behavioral: Negative for depression. The patient is not nervous/anxious.       Past Medical History:  Diagnosis Date  . Basal cell carcinoma [id number]   [place]  . Cellulitis 02/09/2012   RLE  . Complication of anesthesia ~ 2000   "during OR for kidney stones; anesthesia RX made me code twice in one week"  . Depression   . Diastolic dysfunction    a. 11/2014 Echo: EF 50-55%, Gr 1 DD.  [name] History of blood transfusion 1958   "I was an Rh baby"  . Hyperlipidemia   . Hypertension   . Kidney stones   . Midsternal chest pain   . Morbid obesity ([place])   . OSA (obstructive sleep apnea)    "wear CPAP"  . Personal history of colonic adenoma 09/14/2012    reports that he has never smoked. He has never used smokeless tobacco. He reports that he does not drink alcohol or use drugs.   Current Outpatient Medications:  .  amLODipine (NORVASC) 10 MG tablet, TAKE ONE TABLET EVERY DAY, Disp: 90 tablet, Rfl: 1 .  aspirin EC 81 MG tablet, Take 81 mg by mouth daily., Disp: , Rfl:  .  cephALEXin (KEFLEX) 500 MG capsule, Take 1 capsule (500 mg total) by mouth 4 (four) times daily., Disp: 28 capsule, Rfl: 0 .  cholecalciferol (VITAMIN D) 1000 UNITS tablet, Take 1,000 Units by mouth daily., Disp: , Rfl:  .  cyclobenzaprine (FLEXERIL) 10 MG tablet, TAKE 1 TABLET BY MOUTH AT BEDTIME AS NEEDED FOR MUSCLE SPASMS, Disp: 15  tablet, Rfl: 0 .  diclofenac (VOLTAREN) 75 MG EC tablet, Take 75 mg by mouth 2 (two) times daily as needed., Disp: , Rfl:  .  Multiple Vitamins-Minerals (MULTIVITAMIN ADULTS 50+) TABS, Take 1 tablet by mouth daily., Disp: , Rfl:  .  potassium chloride SA (K-DUR) 20 MEQ tablet, TAKE ONE TABLET BY MOUTH EVERY DAY, Disp: 90 tablet, Rfl: 1 .  simvastatin (ZOCOR) 20 MG tablet, TAKE ONE TABLET BY MOUTH AT BEDTIME, Disp: 90 tablet, Rfl: 3 .  triamcinolone cream (KENALOG) 0.1 %, APPLY 1 APPLICATION  TOPICALLY 2 (TWO) TIMES DAILY. (Patient taking differently: APPLY 1 APPLICATION TOPICALLY 2 (TWO) TIMES DAILY. AS NEEDED), Disp: 30 g, Rfl: 0 .  valsartan-hydrochlorothiazide (DIOVAN-HCT) 320-25 MG tablet, TAKE ONE TABLET EVERY DAY, Disp: 90 tablet, Rfl: 1 .  venlafaxine XR (EFFEXOR-XR) 75 MG 24 hr capsule, 3 capsules po daily, Disp: 270 capsule, Rfl: 0   Observations/Objective: Blood pressure 138/88, pulse (!) 101, temperature 97.6 F (36.4 C), temperature source Oral, height 5' 7.5" (1.715 m), SpO2 92 %.  Physical Exam  Physical Exam Constitutional:      General: The patient is not in acute distress. Pulmonary:     Effort: Pulmonary effort is normal. No respiratory distress.  Neurological:     Mental Status: The patient is alert and oriented to person, place, and time.  Psychiatric:        Mood and Affect: Mood normal.        Behavior: Behavior normal.  Less redness in right lower leg, less swelling, chronic venous insufficiency changes  Assessment and Plan Cellulitis of right lower leg Improving but not fully resolved. Continue antibiotics for another 7 days. Follow up prn.  Elevate and treat itching with triamcinolone. Restart compression hose when resolved.     I discussed the assessment and treatment plan with the patient. The patient was provided an opportunity to ask questions and all were answered. The patient agreed with the plan and demonstrated an understanding of the instructions.   The patient was advised to call back or seek an in-person evaluation if the symptoms worsen or if the condition fails to improve as anticipated.     Eliezer Lofts, MD

## 2019-05-28 NOTE — Assessment & Plan Note (Signed)
Improving but not fully resolved. Continue antibiotics for another 7 days. Follow up prn.  Elevate and treat itching with triamcinolone. Restart compression hose when resolved.

## 2019-07-03 ENCOUNTER — Other Ambulatory Visit: Payer: Self-pay | Admitting: Family Medicine

## 2019-07-03 DIAGNOSIS — F3342 Major depressive disorder, recurrent, in full remission: Secondary | ICD-10-CM

## 2019-08-19 ENCOUNTER — Telehealth: Payer: Self-pay | Admitting: Family Medicine

## 2019-08-19 DIAGNOSIS — E118 Type 2 diabetes mellitus with unspecified complications: Secondary | ICD-10-CM

## 2019-08-19 DIAGNOSIS — Z125 Encounter for screening for malignant neoplasm of prostate: Secondary | ICD-10-CM

## 2019-08-19 NOTE — Telephone Encounter (Signed)
-----   Message from Ellamae Sia sent at 08/15/2019 11:34 AM EST ----- Regarding: Lab orders for Tuesday, 1.19.21 Patient is scheduled for CPX labs, please order future labs, Thanks , Karna Christmas

## 2019-08-20 ENCOUNTER — Other Ambulatory Visit (INDEPENDENT_AMBULATORY_CARE_PROVIDER_SITE_OTHER): Payer: Medicaid Other

## 2019-08-20 ENCOUNTER — Other Ambulatory Visit: Payer: Self-pay

## 2019-08-20 DIAGNOSIS — E118 Type 2 diabetes mellitus with unspecified complications: Secondary | ICD-10-CM

## 2019-08-20 DIAGNOSIS — Z125 Encounter for screening for malignant neoplasm of prostate: Secondary | ICD-10-CM

## 2019-08-20 LAB — COMPREHENSIVE METABOLIC PANEL
ALT: 17 U/L (ref 0–53)
AST: 16 U/L (ref 0–37)
Albumin: 3.9 g/dL (ref 3.5–5.2)
Alkaline Phosphatase: 75 U/L (ref 39–117)
BUN: 14 mg/dL (ref 6–23)
CO2: 28 mEq/L (ref 19–32)
Calcium: 8.9 mg/dL (ref 8.4–10.5)
Chloride: 98 mEq/L (ref 96–112)
Creatinine, Ser: 0.92 mg/dL (ref 0.40–1.50)
GFR: 83.13 mL/min (ref >60.00)
Glucose, Bld: 145 mg/dL — ABNORMAL HIGH (ref 70–99)
Potassium: 3.6 mEq/L (ref 3.5–5.1)
Sodium: 139 mEq/L (ref 135–145)
Total Bilirubin: 0.4 mg/dL (ref 0.2–1.2)
Total Protein: 7 g/dL (ref 6.0–8.3)

## 2019-08-20 LAB — PSA: PSA: 1.4 ng/mL (ref 0.10–4.00)

## 2019-08-20 LAB — LIPID PANEL
Cholesterol: 172 mg/dL (ref 0–200)
HDL: 61.6 mg/dL
LDL Cholesterol: 92 mg/dL (ref 0–99)
NonHDL: 110.09
Total CHOL/HDL Ratio: 3
Triglycerides: 90 mg/dL (ref 0.0–149.0)
VLDL: 18 mg/dL (ref 0.0–40.0)

## 2019-08-20 LAB — HEMOGLOBIN A1C: Hgb A1c MFr Bld: 6.4 % (ref 4.6–6.5)

## 2019-08-20 NOTE — Progress Notes (Signed)
No critical labs need to be addressed urgently. We will discuss labs in detail at upcoming office visit.   

## 2019-08-23 ENCOUNTER — Other Ambulatory Visit: Payer: Self-pay

## 2019-08-23 ENCOUNTER — Telehealth: Payer: Self-pay | Admitting: *Deleted

## 2019-08-23 ENCOUNTER — Ambulatory Visit: Payer: 59 | Admitting: Family Medicine

## 2019-08-23 ENCOUNTER — Encounter: Payer: Self-pay | Admitting: Family Medicine

## 2019-08-23 VITALS — BP 138/82 | HR 103 | Temp 98.4°F | Ht 67.5 in | Wt >= 6400 oz

## 2019-08-23 DIAGNOSIS — E118 Type 2 diabetes mellitus with unspecified complications: Secondary | ICD-10-CM

## 2019-08-23 DIAGNOSIS — I5032 Chronic diastolic (congestive) heart failure: Secondary | ICD-10-CM

## 2019-08-23 DIAGNOSIS — Z6841 Body Mass Index (BMI) 40.0 and over, adult: Secondary | ICD-10-CM

## 2019-08-23 DIAGNOSIS — F332 Major depressive disorder, recurrent severe without psychotic features: Secondary | ICD-10-CM

## 2019-08-23 DIAGNOSIS — I10 Essential (primary) hypertension: Secondary | ICD-10-CM

## 2019-08-23 DIAGNOSIS — E782 Mixed hyperlipidemia: Secondary | ICD-10-CM

## 2019-08-23 DIAGNOSIS — Z0000 Encounter for general adult medical examination without abnormal findings: Secondary | ICD-10-CM | POA: Diagnosis not present

## 2019-08-23 DIAGNOSIS — Z23 Encounter for immunization: Secondary | ICD-10-CM | POA: Diagnosis not present

## 2019-08-23 MED ORDER — TRULICITY 0.75 MG/0.5ML ~~LOC~~ SOAJ: 0.7500 mg | SUBCUTANEOUS | 11 refills | Status: DC

## 2019-08-23 NOTE — Assessment & Plan Note (Signed)
Diet controlled.  

## 2019-08-23 NOTE — Telephone Encounter (Signed)
Received email requesting PA for Trulicity.  I was advised to contact [place] [facility] to initiate PA.  PA completed via telephone and sent for pharmacy review.  Can take 3 business days for a decision.

## 2019-08-23 NOTE — Assessment & Plan Note (Signed)
Euvolemic today on exam. Minimal peripheral edema.

## 2019-08-23 NOTE — Assessment & Plan Note (Signed)
Stable control on venlafaxine

## 2019-08-23 NOTE — Progress Notes (Signed)
Chief Complaint  Patient presents with  . Annual Exam    History of Present Illness: HPI  The patient is here for annual wellness exam and preventative care.     Diabetes: Diet controlled. Lab Results  Component Value Date   HGBA1C 6.4 08/20/2019  Using medications without difficulties: Hypoglycemic episodes:none Hyperglycemic episodes:none Feet problems: no ulcer Blood Sugars averaging: FBS 109-120 eye exam within last year: due  HFpEF:   Some swelling On Oxygen continuous for hypoxia due to morbid obesity.   OSA: stable on CPAP   Hypertension:   Poor control here possibly due to exertion getting into office amlodipine, valsartan HCTZ BP Readings from Last 3 Encounters:  08/23/19 (!) 148/100  05/28/19 138/88  05/21/19 (!) 137/97  Using medication without problems or lightheadedness: none Chest pain with exertion: none Edema:none Short of breath:none Average home BPs: per pt at home 136/88 Other issues:  Elevated Cholesterol: LDL at goal < 100 on statin. Lab Results  Component Value Date   CHOL 172 08/20/2019   HDL 61.60 08/20/2019   LDLCALC 92 08/20/2019   LDLDIRECT 153.9 09/10/2007   TRIG 90.0 08/20/2019   CHOLHDL 3 08/20/2019  Using medications without problems:none Muscle aches: none Diet compliance: moderate Exercise: he is very inactive,  He is using Hoverround for mobility when out of house. He walks in house. Other complaints:   MDD: Increased stress in lat year, isolated. Moderate control on venlafaxine 225 mg daily  PHQ9 12/27.  Morbid obesity:  Wt Readings from Last 3 Encounters:  08/23/19 (!) 528 lb 8 oz (239.7 kg)  01/29/19 (!) 524 lb 2 oz (237.7 kg)  08/10/18 (!) 495 lb 8 oz (224.8 kg)      This visit occurred during the SARS-CoV-2 public health emergency.  Safety protocols were in place, including screening questions prior to the visit, additional usage of staff PPE, and extensive cleaning of exam room while observing appropriate  contact time as indicated for disinfecting solutions.   COVID 19 screen:  No recent travel or known exposure to COVID19 The patient denies respiratory symptoms of COVID 19 at this time. The importance of social distancing was discussed today.     Review of Systems  Constitutional: Positive for malaise/fatigue. Negative for chills and fever.  HENT: Negative for congestion and ear pain.   Eyes: Negative for pain and redness.  Respiratory: Negative for cough and shortness of breath.   Cardiovascular: Negative for chest pain, palpitations and leg swelling.  Gastrointestinal: Negative for abdominal pain, blood in stool, constipation, diarrhea, nausea and vomiting.  Genitourinary: Negative for dysuria.  Musculoskeletal: Negative for falls and myalgias.  Skin: Negative for rash.  Neurological: Negative for dizziness.  Psychiatric/Behavioral: Negative for depression. The patient is not nervous/anxious.       Past Medical History:  Diagnosis Date  . Basal cell carcinoma [id number]   [place]  . Cellulitis 02/09/2012   RLE  . Complication of anesthesia ~ 2000   "during OR for kidney stones; anesthesia RX made me code twice in one week"  . Depression   . Diastolic dysfunction    a. 11/2014 Echo: EF 50-55%, Gr 1 DD.  [name] History of blood transfusion 1958   "I was an Rh baby"  . Hyperlipidemia   . Hypertension   . Kidney stones   . Midsternal chest pain   . Morbid obesity ([place])   . OSA (obstructive sleep apnea)    "wear CPAP"  . Personal history of colonic adenoma  09/14/2012    reports that he has never smoked. He has never used smokeless tobacco. He reports that he does not drink alcohol or use drugs.   Current Outpatient Medications:  .  amLODipine (NORVASC) 10 MG tablet, TAKE ONE TABLET EVERY DAY, Disp: 90 tablet, Rfl: 1 .  aspirin EC 81 MG tablet, Take 81 mg by mouth daily., Disp: , Rfl:  .  cyclobenzaprine (FLEXERIL) 10 MG tablet, TAKE 1 TABLET BY MOUTH AT BEDTIME AS  NEEDED FOR MUSCLE SPASMS, Disp: 15 tablet, Rfl: 0 .  diclofenac (VOLTAREN) 75 MG EC tablet, Take 75 mg by mouth 2 (two) times daily as needed., Disp: , Rfl:  .  Multiple Vitamins-Minerals (MULTIVITAMIN ADULTS 50+) TABS, Take 1 tablet by mouth daily., Disp: , Rfl:  .  potassium chloride SA (KLOR-CON) 20 MEQ tablet, TAKE ONE TABLET BY MOUTH EVERY DAY, Disp: 90 tablet, Rfl: 1 .  simvastatin (ZOCOR) 20 MG tablet, TAKE ONE TABLET AT BEDTIME, Disp: 90 tablet, Rfl: 0 .  triamcinolone cream (KENALOG) 0.1 %, APPLY 1 APPLICATION TOPICALLY 2 (TWO) TIMES DAILY. AS NEEDED, Disp: 453.6 g, Rfl: 0 .  valsartan-hydrochlorothiazide (DIOVAN-HCT) 320-25 MG tablet, TAKE ONE TABLET EVERY DAY, Disp: 90 tablet, Rfl: 1 .  venlafaxine XR (EFFEXOR-XR) 75 MG 24 hr capsule, TAKE 3 CAPSULES EVERY DAY, Disp: 270 capsule, Rfl: 1   Observations/Objective: Blood pressure (!) 148/100, pulse (!) 103, temperature 98.4 F (36.9 C), temperature source Temporal, height 5' 7.5" (1.715 m), weight (!) 528 lb 8 oz (239.7 kg), SpO2 95 %.  Physical Exam Constitutional:      Appearance: He is well-developed. He is obese.     Comments: Morbidly obese male, mildly unkempt in hoverround  HENT:     Head: Normocephalic.     Right Ear: Hearing normal.     Left Ear: Hearing normal.     Nose: Nose normal.  Neck:     Thyroid: No thyroid mass or thyromegaly.     Vascular: No carotid bruit.     Trachea: Trachea normal.  Cardiovascular:     Rate and Rhythm: Normal rate and regular rhythm.     Pulses: Normal pulses.     Heart sounds: Heart sounds not distant. No murmur. No friction rub. No gallop.      Comments: No peripheral edema Pulmonary:     Effort: Pulmonary effort is normal. No respiratory distress.     Breath sounds: Normal breath sounds.  Skin:    General: Skin is warm and dry.     Findings: No rash.  Psychiatric:        Speech: Speech normal.        Behavior: Behavior normal.        Thought Content: Thought content normal.       Diabetic foot exam: Normal inspection No skin breakdown No calluses  Normal DP pulses Normal sensation to light touch and monofilament Nails thickenedl  Assessment and Plan   The patient's preventative maintenance and recommended screening tests for an annual wellness exam were reviewed in full today. Brought up to date unless services declined.  Counselled on the importance of diet, exercise, and its role in overall health and mortality. The patient's FH and SH was reviewed, including their home life, tobacco status, and drug and alcohol status.   Vaccines: given flu, otherwise uptodate Prostate Cancer Screen:  Stable, dad with pro cancer.  Lab Results  Component Value Date   PSA 1.40 08/20/2019   PSA 2.06 08/08/2018  PSA 2.89 05/04/2017         Colon Cancer Screen:  colonoscopy in 2009, repeat in 5 year Plans to return stool cards      Smoking Status:none ETOH/ drug [id number] Hep C: done HIV screen:  done  Morbid obesity with BMI of 70 and over, adult ([place])  Start trulicity. Encouraged exercise, weight loss, healthy eating habits.   Hyperlipidemia Good contrl on statin.  Major depressive disorder, recurrent episode, severe (HCC) Stable control on venlafaxine  Diastolic heart failure Euvolemic today on exam. Minimal peripheral edema.  Hypertension  ON recheck in office.. improved.  Follow at home.  Controlled type 2 diabetes mellitus with complication, without long-term current use of insulin (HCC) Diet controlled.    Eliezer Lofts, MD

## 2019-08-23 NOTE — Assessment & Plan Note (Signed)
Start trulicity. Encouraged exercise, weight loss, healthy eating habits.

## 2019-08-23 NOTE — Assessment & Plan Note (Signed)
ON recheck in office.. improved.  Follow at home.

## 2019-08-23 NOTE — Assessment & Plan Note (Signed)
Good contrl on statin.

## 2019-08-23 NOTE — Patient Instructions (Addendum)
Set up yearly eye exam.  Elevate feet as able above heart.  Follow BP at home. Call if > 140/90. Return stool cards for coloncancer screening.

## 2019-09-05 NOTE — Telephone Encounter (Signed)
Spoke with Blake Garcia about PA denial.  He states he was able to get the Trulicity.  He is using a coupon and paying $25 a month.

## 2019-09-05 NOTE — Telephone Encounter (Signed)
This was for weight loss. Metformin not appropriate.

## 2019-09-05 NOTE — Telephone Encounter (Addendum)
PA denied.  Denial in Dr. Rometta Emery in box to review.  Patient must try and fail metformin or a combination drug containing metformin.

## 2019-09-20 ENCOUNTER — Other Ambulatory Visit: Payer: Self-pay

## 2019-09-20 ENCOUNTER — Encounter: Payer: Self-pay | Admitting: Family Medicine

## 2019-09-20 ENCOUNTER — Ambulatory Visit (INDEPENDENT_AMBULATORY_CARE_PROVIDER_SITE_OTHER): Payer: 59 | Admitting: Family Medicine

## 2019-09-20 ENCOUNTER — Ambulatory Visit: Payer: 59 | Admitting: Family Medicine

## 2019-09-20 VITALS — BP 133/89 | HR 96 | Ht 67.5 in

## 2019-09-20 DIAGNOSIS — E118 Type 2 diabetes mellitus with unspecified complications: Secondary | ICD-10-CM

## 2019-09-20 DIAGNOSIS — Z6841 Body Mass Index (BMI) 40.0 and over, adult: Secondary | ICD-10-CM

## 2019-09-20 MED ORDER — TRULICITY 1.5 MG/0.5ML ~~LOC~~ SOAJ: 1.5000 mg | SUBCUTANEOUS | 11 refills | Status: DC

## 2019-09-20 NOTE — Progress Notes (Signed)
VIRTUAL VISIT Due to national recommendations of social distancing due to [place] 19, a virtual visit is felt to be most appropriate for this patient at this time.   I connected with the patient on 09/20/19 at 10:40 AM EST by virtual telehealth platform and verified that I am speaking with the correct person using two identifiers.   I discussed the limitations, risks, security and privacy concerns of performing an evaluation and management service by  virtual telehealth platform and the availability of in person appointments. I also discussed with the patient that there may be a patient responsible charge related to this service. The patient expressed understanding and agreed to proceed.  Patient location: Home Provider Location: [facility] Participants: Eliezer Lofts and [name]   Chief Complaint  Patient presents with  . Follow-up    Weight Management with Trulicity start    History of Present Illness:  64 year old male with DM presents for 4 week follow up  weight management of morbid obesity following start of Trulicity.  He reports  he is not having SE to Trulicity.  He has noted decrease in appetite. He is decreasing portion size.  His FBS this AM 87... has been running 87-112  He does not have a scale at home. Pants are less tight.  He has been trying to increase walking.. daily goal walking to mailbox and back.  At last check DM stable. Lab Results  Component Value Date   HGBA1C 6.4 08/20/2019     COVID 19 screen No recent travel or known exposure to [place] The patient denies respiratory symptoms of COVID 19 at this time.  The importance of social distancing was discussed today.   Review of Systems  Constitutional: Negative for chills and fever.  HENT: Negative for congestion and ear pain.   Eyes: Negative for pain and redness.  Respiratory: Negative for cough and shortness of breath.   Cardiovascular: Negative for chest pain, palpitations and  leg swelling.  Gastrointestinal: Negative for abdominal pain, blood in stool, constipation, diarrhea, nausea and vomiting.  Genitourinary: Negative for dysuria.  Musculoskeletal: Negative for falls and myalgias.  Skin: Negative for rash.  Neurological: Negative for dizziness.  Psychiatric/Behavioral: Negative for depression. The patient is not nervous/anxious.       Past Medical History:  Diagnosis Date  . Basal cell carcinoma [id number]   [place]  . Cellulitis 02/09/2012   RLE  . Complication of anesthesia ~ 2000   "during OR for kidney stones; anesthesia RX made me code twice in one week"  . Depression   . Diastolic dysfunction    a. 11/2014 Echo: EF 50-55%, Gr 1 DD.  [name] History of blood transfusion 1958   "I was an Rh baby"  . Hyperlipidemia   . Hypertension   . Kidney stones   . Midsternal chest pain   . Morbid obesity ([place])   . OSA (obstructive sleep apnea)    "wear CPAP"  . Personal history of colonic adenoma 09/14/2012    reports that he has never smoked. He has never used smokeless tobacco. He reports that he does not drink alcohol or use drugs.   Current Outpatient Medications:  .  amLODipine (NORVASC) 10 MG tablet, TAKE ONE TABLET EVERY DAY, Disp: 90 tablet, Rfl: 1 .  aspirin EC 81 MG tablet, Take 81 mg by mouth daily., Disp: , Rfl:  .  cyclobenzaprine (FLEXERIL) 10 MG tablet, TAKE 1 TABLET BY MOUTH AT BEDTIME AS NEEDED FOR  MUSCLE SPASMS, Disp: 15 tablet, Rfl: 0 .  diclofenac (VOLTAREN) 75 MG EC tablet, Take 75 mg by mouth 2 (two) times daily as needed., Disp: , Rfl:  .  Dulaglutide (TRULICITY) A999333 0000000 SOPN, Inject 0.75 mg into the skin once a week., Disp: 3 mL, Rfl: 11 .  Multiple Vitamins-Minerals (MULTIVITAMIN ADULTS 50+) TABS, Take 1 tablet by mouth daily., Disp: , Rfl:  .  potassium chloride SA (KLOR-CON) 20 MEQ tablet, TAKE ONE TABLET BY MOUTH EVERY DAY, Disp: 90 tablet, Rfl: 1 .  simvastatin (ZOCOR) 20 MG tablet, TAKE ONE TABLET AT BEDTIME, Disp:  90 tablet, Rfl: 0 .  triamcinolone cream (KENALOG) 0.1 %, APPLY 1 APPLICATION TOPICALLY 2 (TWO) TIMES DAILY. AS NEEDED, Disp: 453.6 g, Rfl: 0 .  valsartan-hydrochlorothiazide (DIOVAN-HCT) 320-25 MG tablet, TAKE ONE TABLET EVERY DAY, Disp: 90 tablet, Rfl: 1 .  venlafaxine XR (EFFEXOR-XR) 75 MG 24 hr capsule, TAKE 3 CAPSULES EVERY DAY, Disp: 270 capsule, Rfl: 1   Observations/Objective: Blood pressure 133/89, pulse 96, height 5' 7.5" (1.715 m), SpO2 94 %.  Physical Exam  Physical Exam Constitutional:      General: The patient is not in acute distress. Pulmonary:     Effort: Pulmonary effort is normal. No respiratory distress.  Neurological:     Mental Status: The patient is alert and oriented to person, place, and time.  Psychiatric:        Mood and Affect: Mood normal.        Behavior: Behavior normal.   Assessment and Plan    I discussed the assessment and treatment plan with the patient. The patient was provided an opportunity to ask questions and all were answered. The patient agreed with the plan and demonstrated an understanding of the instructions.   The patient was advised to call back or seek an in-person evaluation if the symptoms worsen or if the condition fails to improve as anticipated.   Morbid obesity with BMI of 70 and over, adult ([place]) No weight for today but tolerating med well, decrease in waist circumference per pt.  Will increase to  1.5 mg weekly of Trulicity.  follow up in 4 weeks.    Eliezer Lofts, MD

## 2019-09-20 NOTE — Assessment & Plan Note (Signed)
No weight for today but tolerating med well, decrease in waist circumference per pt.  Will increase to  1.5 mg weekly of Trulicity.  follow up in 4 weeks.

## 2019-09-24 ENCOUNTER — Telehealth: Payer: Self-pay | Admitting: Cardiovascular Disease

## 2019-09-24 NOTE — Telephone Encounter (Signed)
Patient has been contacted at least 3 times for a recall, recall has been deleted

## 2019-09-27 ENCOUNTER — Other Ambulatory Visit
Admission: RE | Admit: 2019-09-27 | Discharge: 2019-09-27 | Disposition: A | Discharge status: Home / Self Care | Payer: 59 | Source: Ambulatory Visit | Attending: Physician Assistant | Admitting: Physician Assistant

## 2019-09-27 ENCOUNTER — Other Ambulatory Visit: Payer: Self-pay

## 2019-09-27 ENCOUNTER — Encounter: Payer: Self-pay | Admitting: Physician Assistant

## 2019-09-27 ENCOUNTER — Ambulatory Visit (INDEPENDENT_AMBULATORY_CARE_PROVIDER_SITE_OTHER): Payer: 59

## 2019-09-27 ENCOUNTER — Ambulatory Visit (INDEPENDENT_AMBULATORY_CARE_PROVIDER_SITE_OTHER): Payer: 59 | Admitting: Physician Assistant

## 2019-09-27 VITALS — BP 132/80 | HR 110 | Ht 67.0 in | Wt >= 6400 oz

## 2019-09-27 DIAGNOSIS — Z8249 Family history of ischemic heart disease and other diseases of the circulatory system: Secondary | ICD-10-CM

## 2019-09-27 DIAGNOSIS — R0789 Other chest pain: Secondary | ICD-10-CM | POA: Diagnosis not present

## 2019-09-27 DIAGNOSIS — I5032 Chronic diastolic (congestive) heart failure: Secondary | ICD-10-CM | POA: Insufficient documentation

## 2019-09-27 DIAGNOSIS — Z6841 Body Mass Index (BMI) 40.0 and over, adult: Secondary | ICD-10-CM

## 2019-09-27 DIAGNOSIS — I452 Bifascicular block: Secondary | ICD-10-CM

## 2019-09-27 DIAGNOSIS — E1169 Type 2 diabetes mellitus with other specified complication: Secondary | ICD-10-CM

## 2019-09-27 DIAGNOSIS — I10 Essential (primary) hypertension: Secondary | ICD-10-CM

## 2019-09-27 DIAGNOSIS — I493 Ventricular premature depolarization: Secondary | ICD-10-CM | POA: Diagnosis not present

## 2019-09-27 DIAGNOSIS — Z9989 Dependence on other enabling machines and devices: Secondary | ICD-10-CM

## 2019-09-27 DIAGNOSIS — E785 Hyperlipidemia, unspecified: Secondary | ICD-10-CM

## 2019-09-27 DIAGNOSIS — G4733 Obstructive sleep apnea (adult) (pediatric): Secondary | ICD-10-CM

## 2019-09-27 DIAGNOSIS — R000 Tachycardia, unspecified: Secondary | ICD-10-CM

## 2019-09-27 DIAGNOSIS — R5383 Other fatigue: Secondary | ICD-10-CM

## 2019-09-27 DIAGNOSIS — R0602 Shortness of breath: Secondary | ICD-10-CM | POA: Diagnosis not present

## 2019-09-27 LAB — BASIC METABOLIC PANEL
Anion gap: 13 (ref 5–15)
BUN: 15 mg/dL (ref 8–23)
CO2: 29 mmol/L (ref 22–32)
Calcium: 9.3 mg/dL (ref 8.9–10.3)
Chloride: 98 mmol/L (ref 98–111)
Creatinine, Ser: 0.93 mg/dL (ref 0.61–1.24)
GFR calc Af Amer: >60 mL/min (ref >60)
GFR calc non Af Amer: >60 mL/min (ref >60)
Glucose, Bld: 129 mg/dL — ABNORMAL HIGH (ref 70–99)
Potassium: 4 mmol/L (ref 3.5–5.1)
Sodium: 140 mmol/L (ref 135–145)

## 2019-09-27 LAB — CBC
HCT: 48.7 % (ref 39.0–52.0)
Hemoglobin: 15.5 g/dL (ref 13.0–17.0)
MCH: 29.8 pg (ref 26.0–34.0)
MCHC: 31.8 g/dL (ref 30.0–36.0)
MCV: 93.7 fL (ref 80.0–100.0)
Platelets: 232 10*3/uL (ref 150–400)
RBC: 5.2 MIL/uL (ref 4.22–5.81)
RDW: 13.7 % (ref 11.5–15.5)
WBC: 9.2 10*3/uL (ref 4.0–10.5)
nRBC: 0 % (ref 0.0–0.2)

## 2019-09-27 LAB — MAGNESIUM: Magnesium: 2.2 mg/dL (ref 1.7–2.4)

## 2019-09-27 LAB — TSH: TSH: 2.457 u[IU]/mL (ref 0.350–4.500)

## 2019-09-27 NOTE — Patient Instructions (Addendum)
Medication Instructions:  Your physician recommends that you continue on your current medications as directed. Please refer to the Current Medication list given to you today.  *If you need a refill on your cardiac medications before your next appointment, please call your pharmacy*   Lab Work: Your physician recommends that you return for lab work today at the medical mall.   No appt is needed. Hours are M-F 7AM- 6 PM. (BMET, Mag, CBC, Tsh)  If you have labs (blood work) drawn today and your tests are completely normal, you will receive your results only by: [name] MyChart Message (if you have MyChart) OR . A paper copy in the mail If you have any lab test that is abnormal or we need to change your treatment, we will call you to review the results.   Testing/Procedures: 1- Echo  Please return to [facility] on ______________ at _______________ AM/PM for an Echocardiogram. Your physician has requested that you have an echocardiogram. Echocardiography is a painless test that uses sound waves to create images of your heart. It provides your doctor with information about the size and shape of your heart and how well your heart's chambers and valves are working. This procedure takes approximately one hour. There are no restrictions for this procedure. Please note; depending on visual quality an IV may need to be placed.   2- A zio monitor was placed today. It will remain on for 14 days. You will then return monitor and event diary in provided box. It takes 1-2 weeks for report to be downloaded and returned to [name]. We will call you with the results. If monitor falls of or has orange flashing light, please call Zio for further instructions.    Follow-Up: At [facility], you and your health needs are our priority.  As part of our continuing mission to provide you with exceptional heart care, we have created designated Provider Care Teams.  These Care Teams include your primary  Cardiologist (physician) and Advanced Practice Providers (APPs -  Physician Assistants and Nurse Practitioners) who all work together to provide you with the care you need, when you need it.  We recommend signing up for the patient portal called "MyChart".  Sign up information is provided on this After Visit Summary.  MyChart is used to connect with patients for Virtual Visits (Telemedicine).  Patients are able to view lab/test results, encounter notes, upcoming appointments, etc.  Non-urgent messages can be sent to your provider as well.   To learn more about what you can do with MyChart, go to [email or web address].    Your next appointment:   4 week(s)  The format for your next appointment:   In Person  Provider:    You may see Dr. Fletcher Anon or Marrianne Mood, PA-C.

## 2019-09-27 NOTE — Progress Notes (Signed)
Office Visit    Patient Name: Blake Garcia Date of Encounter: 09/27/2019  Primary Care Provider:  Jinny Sanders, MD Primary Cardiologist:  No primary care provider on file.  Chief Complaint    64 year old male with history of hypertension, diastolic dysfunction, chest pain, sinus tachycardia, and morbid obesity, and who presents for 73-month follow-up.  Past Medical History    Past Medical History:  Diagnosis Date  . Basal cell carcinoma [id number]   [place]  . Cellulitis 02/09/2012   RLE  . Complication of anesthesia ~ 2000   "during OR for kidney stones; anesthesia RX made me code twice in one week"  . Depression   . Diastolic dysfunction    a. 11/2014 Echo: EF 50-55%, Gr 1 DD.  [name] History of blood transfusion 1958   "I was an Rh baby"  . Hyperlipidemia   . Hypertension   . Kidney stones   . Midsternal chest pain   . Morbid obesity ([place])   . OSA (obstructive sleep apnea)    "wear CPAP"  . Personal history of colonic adenoma 09/14/2012   Past Surgical History:  Procedure Laterality Date  . CHOLECYSTECTOMY  1993  . ESOPHAGOGASTRODUODENOSCOPY    . KNEE ARTHROSCOPY  ~ 2004   right  . LITHOTRIPSY  2000    Allergies  No Known Allergies  History of Present Illness    64 year old male with the above complex past medical history.  He has a history of morbid obesity with associated hypertension and diastolic dysfunction.  He was previously evaluated secondary to chest pain and DOE with echo showing normal LVSF in 2016.  He has not undergone ischemic evaluation given his body habitus, as this will affect image quality / reliability of the study.  When seen in the office 09/02/2015, he denied recurrence of chest pain.  He did note chronic dyspnea on exertion, attributed to weight and deconditioning with decreased activity. He has been followed closely by his PCP and was interested in losing weight and looking for surgical weight loss options. 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 He presented to an outside clinic for DOT exam 07/17/2018 but was recommended he first undergo evaluation by cardiology, given his morbid obesity placed him at risk for cardiovascular disease.  In clinic, he denied further chest pain episodes at rest or with exertion.  He continued to note dyspnea on exertion with ambulation, ambulating only approximately 300 feet before feeling short of breath.  He also felt leg weakness with ambulation.  He was using a cane.  He was compliant with the CPAP.  His case was discussed with his DOT medical examiner in detail.  Unfortunately, cardiac testing was unable to be provided as outlined in previous notes and given his morbid obesity and body habitus. He was not thus able to receive cardiac clearance for his DOT card.  It was recommended he discuss further weight loss options/bariatric surgery with his PCP.  DOT form was completed. Echo was offered; however, due to insurance coverage, deferred at that time.  Today, he reports a return of his previous atypical chest pain.  He does report that this CP is similar to previous occurrences of CP in the past.  The CP is described as occurring at both rest and  with exertion. It is left sided, nonradiating, pleuritic, and breif. Associated sx are SOB. Each episode lasts 1 to 2 minutes before resolving without clear triggers or alleviating factors.  He notes a significant family history of heart disease today and concern that his chest pain has returned, as well as his recently progressive fatigue. He recalls that previous family cardiac events were preceded by fatigue; therefore, this concerns him. He reports even if able to get a full night of sleep, he still is exhausted after minimal activity.  He reports CPAP  compliance.  He has started chronic oxygen therapy since last in clinic, though still notes shortness of breath/ DOE on this oxygen.  His activity is limited by his body habitus and deconditioning with encouragement to increase activity as tolerated. EKG and rhythm strip performed with frequent PVCs, despite lack of racing heart rate, palpitations, presyncope, or syncope.  No recent falls.  He denies orthopnea, abdominal distention, early satiety, and PND.  Lower extremity edema noted on exam without significant symptoms reported.  He reports medication compliance.  No signs or symptoms consistent with bleeding.  Home Medications    Prior to Admission medications   Medication Sig Start Date End Date Taking? Authorizing Provider  amLODipine (NORVASC) 10 MG tablet TAKE ONE TABLET EVERY DAY 07/03/19   Bedsole, Amy E, MD  aspirin EC 81 MG tablet Take 81 mg by mouth daily.    [provider]  cyclobenzaprine (FLEXERIL) 10 MG tablet TAKE 1 TABLET BY MOUTH AT BEDTIME AS NEEDED FOR MUSCLE SPASMS 09/14/15   Bedsole, Amy E, MD  diclofenac (VOLTAREN) 75 MG EC tablet Take 75 mg by mouth 2 (two) times daily as needed.    [provider]  Dulaglutide (TRULICITY) 1.5 0000000 SOPN Inject 1.5 mg into the skin once a week. 09/20/19   Bedsole, Amy E, MD  Multiple Vitamins-Minerals (MULTIVITAMIN ADULTS 50+) TABS Take 1 tablet by mouth daily.    [provider]  potassium chloride SA (KLOR-CON) 20 MEQ tablet TAKE ONE TABLET BY MOUTH EVERY DAY 07/03/19   Bedsole, Amy E, MD  simvastatin (ZOCOR) 20 MG tablet TAKE ONE TABLET AT BEDTIME 07/03/19   Bedsole, Amy E, MD  triamcinolone cream (KENALOG) 0.1 % APPLY 1 APPLICATION TOPICALLY 2 (TWO) TIMES DAILY. AS NEEDED 05/28/19   Bedsole, Amy E, MD  valsartan-hydrochlorothiazide (DIOVAN-HCT) 320-25 MG tablet TAKE ONE TABLET EVERY DAY 03/26/19   Bedsole, Amy E, MD  venlafaxine XR (EFFEXOR-XR) 75 MG 24 hr capsule TAKE 3 CAPSULES EVERY DAY 07/03/19   Bedsole, Amy  E, MD    Review of Systems    He denies palpitations, pnd, orthopnea, n, v, dizziness, syncope, weight gain, or early satiety.  He reports chronic asx LEE, atypical chest pain as described above, shortness of breath, dyspnea, and fatigue.   All other systems reviewed and are otherwise negative except as noted above.  Physical Exam    VS:  BP 132/80 (BP Location: Left Arm, Patient Position: Sitting, Cuff Size: Normal)   Pulse (!) 110   Ht 5\' 7"  (1.702 m)   Wt (!) 524 lb (237.7 kg)   SpO2 96%   BMI 82.07 kg/m  , BMI Body mass index is 82.07 kg/m. GEN: Obese male, in no acute distress.  Seated in wheelchair.  Mask in place. HEENT: normal. Neck: Supple, JVD difficult to assess 2/2 body habitus.  No carotid bruits, or masses. Cardiac: tachycardic with extrasystole appreciated.  No murmurs, rubs, or gallops. No clubbing, cyanosis.  Chronic 1+ bilateral edema noted.  Radials/DP/PT 2+ and equal bilaterally.  Respiratory:  Respirations regular and unlabored, distant breath sounds but otherwise clear to auscultation bilaterally. GI: Soft, nontender, nondistended, BS + x 4. MS: no deformity or atrophy. Skin: warm and dry, no rash. Neuro:  Strength and sensation are intact. Psych: Normal affect.  Accessory Clinical Findings    ECG personally reviewed by me today - ST with bifascicular block and frequent PVCs, 103 bpm - no acute changes.  VITALS Reviewed   Temp Readings from Last 3 Encounters:  08/23/19 98.4 F (36.9 C) (Temporal)  05/28/19 97.6 F (36.4 C) (Oral)  05/21/19 97.9 F (36.6 C) (Oral)   BP Readings from Last 3 Encounters:  09/27/19 132/80  09/20/19 133/89  08/23/19 138/82   Pulse Readings from Last 3 Encounters:  09/27/19 (!) 110  09/20/19 96  08/23/19 (!) 103    Wt Readings from Last 3 Encounters:  09/27/19 (!) 524 lb (237.7 kg)  08/23/19 (!) 528 lb 8 oz (239.7 kg)  01/29/19 (!) 524 lb 2 oz (237.7 kg)     LABS  reviewed   [place] present?  Yes/No: Yes but not current  Lab Results  Component Value Date   WBC 9.2 09/27/2019   HGB 15.5 09/27/2019   HCT 48.7 09/27/2019   MCV 93.7 09/27/2019   PLT 232 09/27/2019   Lab Results  Component Value Date   CREATININE 0.93 09/27/2019   BUN 15 09/27/2019   NA 140 09/27/2019   K 4.0 09/27/2019   CL 98 09/27/2019   CO2 29 09/27/2019   Lab Results  Component Value Date   ALT 17 08/20/2019   AST 16 08/20/2019   ALKPHOS 75 08/20/2019   BILITOT 0.4 08/20/2019   Lab Results  Component Value Date   CHOL 172 08/20/2019   HDL 61.60 08/20/2019   LDLCALC 92 08/20/2019   LDLDIRECT 153.9 09/10/2007   TRIG 90.0 08/20/2019   CHOLHDL 3 08/20/2019    Lab Results  Component Value Date   HGBA1C 6.4 08/20/2019   Lab Results  Component Value Date   TSH 2.457 09/27/2019     STUDIES/PROCEDURES reviewed    TTE 12/30/2014 - Left ventricle: The cavity size was normal. There was moderate  concentric hypertrophy. Systolic function was normal. The  estimated ejection fraction was in the range of 50% to 55%.  Images were inadequate for LV wall motion assessment. Doppler  parameters are consistent with abnormal left ventricular  relaxation (grade 1 diastolic dysfunction).  Impressions:  - very suboptimal study due to body habitus.  Lower extremity venous duplex 01/2012 Summary:  - No evidence of deep vein or superficial thrombosis  involving the right lower extremity and left common  femoral vein.  - No evidence of Baker's cyst on the right.   Assessment & Plan     Atypical chest pain  Family history of cardiac disease -Reports chest pain is similar to previous episodes in the past.  Described as brief and pleuritic, lasting only 1 to 2 minutes.  Associated symptoms include SOB/DOE, as well as he has noted recent progressive fatigue.  Unfortunately, further risk stratification is limited by his current body habitus and given it likely will result in suboptimal  cardiac imaging / reliable results.  Previously ordreed echo (at last visit) deferred 2/2 insurance issues. Will reorder echo.  2016 echo as above with EF 50 to 55% and G1DD.  Will defer further work-up with treadmill Myoview, Lexiscan Myoview, cardiac  CT, and cardiac catheterization at this time given limitations presented by current weight.  Given his PVCs on EKG/rhythm strip today, ordered 2-week ZIO XT/cardiac monitoring to rule out atypical chest pain 2/2 arrhythmia/ectopy and to further quantify burden of PVCs.  Ordered updated labs for further risk stratification and including CBC, BMET, magnesium, and TSH.  Previous A1c 6.4.  Continued aggressive risk factor modification including weight loss, glycemic control, blood pressure, and lipid control recommended.  He will call the office if any change in symptoms before his next follow-up.  Shortness of breath/DOE --Reports ongoing shortness of breath/DOE.  Since his last visit, he has been started on chronic oxygen therapy.  Despite this therapy, he continues to note breathing difficulties, especially during episodes of chest pain as described above.  He continues to use his CPAP.  SOB/DOE likely multifactorial in the setting of his morbid obesity and severe physical deconditioning.  Will update echocardiogram as previously discussed but deferred 2/2 insurance.  Previous echo as above with suboptimal images reported; however, given his symptoms as reported today, it is reasonable to update an echocardiogram.  Also considered is SOB/DOE due to arrhythmia/ectopy with 2 weeks ZIO XT/cardiac monitoring placed today.  Ordered TSH, CBC, BMET/Mg as well.  Continue oxygen therapy.  Continue current medications.  Lifestyle changes recommended as above.  PVCs/ Sinus Tachycardia/ bifascicular block --EKG shows sinus tachycardia, bifascicular block, and frequent ectopy.  Reports fatigue with ongoing SOB/DOE.  No racing heart rate or palpitations.  Recommend ZIO  XT/cardiac monitoring for 2 weeks as above to rule out arrhythmia and quantify burden of ectopy.  Further recommendations pending results of Zio, as well as echo to reassess EF.  Of note, given his sinus tachycardia & atypical chest pain, considered PE given his sedentary lifestyle. However, tachycardia and similar CP noted at previous visits with SpO2 96% today and reported s/sx concerning for recent PE, arguing against acute PE.  Chronic diastolic heart failure --As above /reported under shortness of breath/DOE.  Also noted is stable 1+ bilateral lower extremity edema with consideration of current amlodipine/dependent edema and recommendation for compression stockings.  Otherwise, appears euvolemic on exam.  Update echo as above.  Continue current medications.  Hypertension -BP today borderline.  If BP remains elevated at RTC, recommend modification of current antihypertensive regimen for optimal BP control and risk factor modification.  Continue current medications.  Hyperlipidemia -Most recent cholesterol panel 08/20/2019 and shows total cholesterol 172, LDL improved from previous and 92, HDL 61.60, triglycerides 90.  Continue simvastatin 20 mg with titration as needed per his PCP and for aggressive risk factor control.     DM2 -08/20/2019 hemoglobin A1c 6.4.  Tight glycemic control recommended for aggressive risk factor modification.  Defer to PCP.  Morbid obesity --Weight loss/lifestyle changes recommended as above and as his current weight places him at an increased risk for cardiac disease.  OSA on CPAP --Encouraged ongoing CPAP use.   Medication changes: None. Labs ordered: CBC, BMET, magnesium, TSH Studies / Imaging ordered: 2-week ZIO XT, echocardiogram Disposition: RTC 1 month  Total time spent with patient today 45 minutes. This includes reviewing records, evaluating the patient, and coordinating care. Face-to-face time >50%.    Arvil Chaco, PA-C 09/27/2019, 11:13 PM

## 2019-10-07 ENCOUNTER — Other Ambulatory Visit: Payer: Self-pay

## 2019-10-15 ENCOUNTER — Telehealth: Payer: Self-pay

## 2019-10-15 ENCOUNTER — Telehealth: Payer: Self-pay | Admitting: Physician Assistant

## 2019-10-15 ENCOUNTER — Other Ambulatory Visit: Payer: 59

## 2019-10-15 NOTE — Telephone Encounter (Signed)
Spoke with patient .  He is aware appt will be cancelled due to denial.   Per patient check weight capacity when rescheduling   He is willing to go to [facility] or to [facility] for testing.

## 2019-10-15 NOTE — Telephone Encounter (Signed)
Please call with monitor results °

## 2019-10-15 NOTE — Telephone Encounter (Signed)
Message sent through mychart to let the patient know that his monitor results are still pending and that we will contact him whenever we get the results and they are reviewed.

## 2019-10-15 NOTE — Telephone Encounter (Signed)
-----   Message from [name] sent at 10/14/2019  4:09 PM EDT ----- Regarding: RESCHEDULE Good Afternoon [name],  This patient's echo Blake Garcia was denied today.  Can you help with rescheduling his appt.?  The turn around on a reconsideration is 30 days and I submitted that already today?  Thank you,  [name]

## 2019-10-17 ENCOUNTER — Ambulatory Visit: Payer: 59 | Admitting: Family Medicine

## 2019-10-22 ENCOUNTER — Encounter: Payer: Self-pay | Admitting: Family Medicine

## 2019-10-22 ENCOUNTER — Other Ambulatory Visit: Payer: Self-pay

## 2019-10-22 ENCOUNTER — Ambulatory Visit (INDEPENDENT_AMBULATORY_CARE_PROVIDER_SITE_OTHER): Payer: 59 | Admitting: Family Medicine

## 2019-10-22 VITALS — BP 140/84 | HR 108 | Temp 98.5°F | Ht 67.5 in | Wt >= 6400 oz

## 2019-10-22 DIAGNOSIS — E662 Morbid (severe) obesity with alveolar hypoventilation: Secondary | ICD-10-CM | POA: Diagnosis not present

## 2019-10-22 DIAGNOSIS — Z6841 Body Mass Index (BMI) 40.0 and over, adult: Secondary | ICD-10-CM

## 2019-10-22 DIAGNOSIS — E118 Type 2 diabetes mellitus with unspecified complications: Secondary | ICD-10-CM | POA: Diagnosis not present

## 2019-10-22 DIAGNOSIS — I5032 Chronic diastolic (congestive) heart failure: Secondary | ICD-10-CM | POA: Diagnosis not present

## 2019-10-22 NOTE — Assessment & Plan Note (Signed)
Oxygen dependent. Serious comorbidity from morbid obesity.

## 2019-10-22 NOTE — Patient Instructions (Signed)
The consulting office will call you to set up an appointment in next several weeks.  Keep working on stress reduction, increased activity and healthy diet. Continue Trulicity at same dose.

## 2019-10-22 NOTE — Assessment & Plan Note (Signed)
Follwoed by cardiology.. reviewed last OV 09/2019

## 2019-10-22 NOTE — Assessment & Plan Note (Signed)
At goal last check and with recent CBGs.

## 2019-10-22 NOTE — Progress Notes (Signed)
Chief Complaint  Patient presents with  . Follow-up    Weight Management    History of Present Illness: HPI    64 year old male presents for 1 month follow up morbid obesity and weight managment  At last OV increased the dose of trulicity to 1.5 mg weekly.  Today he reports tolerated the trulicity at higher. He feels that he is less hungry, staying full.  Unfortunately no weight loss yet.   CBG fasting average 109, no > 60, <200  Wt Readings from Last 3 Encounters:  10/22/19 (!) 536 lb 8 oz (243.4 kg)  09/27/19 (!) 524 lb (237.7 kg)  08/23/19 (!) 528 lb 8 oz (239.7 kg)    Wearing compression hose. Decreased pain.   This visit occurred during the SARS-CoV-2 public health emergency.  Safety protocols were in place, including screening questions prior to the visit, additional usage of staff PPE, and extensive cleaning of exam room while observing appropriate contact time as indicated for disinfecting solutions.   COVID 19 screen:  No recent travel or known exposure to COVID19 The patient denies respiratory symptoms of COVID 19 at this time. The importance of social distancing was discussed today.     Review of Systems  Constitutional: Negative for chills and fever.  HENT: Negative for congestion and ear pain.   Eyes: Negative for pain and redness.  Respiratory: Positive for shortness of breath. Negative for cough.   Cardiovascular: Negative for chest pain and leg swelling.  Gastrointestinal: Negative for abdominal pain, blood in stool, constipation, diarrhea, nausea and vomiting.  Genitourinary: Negative for dysuria.  Musculoskeletal: Negative for falls and myalgias.  Skin: Negative for rash.  Neurological: Negative for dizziness.  Psychiatric/Behavioral: Negative for depression. The patient is not nervous/anxious.       Past Medical History:  Diagnosis Date  . Basal cell carcinoma [id number]   [place]  . Cellulitis 02/09/2012   RLE  . Complication of  anesthesia ~ 2000   "during OR for kidney stones; anesthesia RX made me code twice in one week"  . Depression   . Diastolic dysfunction    a. 11/2014 Echo: EF 50-55%, Gr 1 DD.  [name] History of blood transfusion 1958   "I was an Rh baby"  . Hyperlipidemia   . Hypertension   . Kidney stones   . Midsternal chest pain   . Morbid obesity ([place])   . OSA (obstructive sleep apnea)    "wear CPAP"  . Personal history of colonic adenoma 09/14/2012    reports that he has never smoked. He has never used smokeless tobacco. He reports that he does not drink alcohol or use drugs.   Current Outpatient Medications:  .  amLODipine (NORVASC) 10 MG tablet, TAKE ONE TABLET EVERY DAY, Disp: 90 tablet, Rfl: 1 .  aspirin EC 81 MG tablet, Take 81 mg by mouth daily., Disp: , Rfl:  .  cyclobenzaprine (FLEXERIL) 10 MG tablet, TAKE 1 TABLET BY MOUTH AT BEDTIME AS NEEDED FOR MUSCLE SPASMS, Disp: 15 tablet, Rfl: 0 .  diclofenac (VOLTAREN) 75 MG EC tablet, Take 75 mg by mouth 2 (two) times daily as needed., Disp: , Rfl:  .  Dulaglutide (TRULICITY) 1.5 0000000 SOPN, Inject 1.5 mg into the skin once a week., Disp: 3 mL, Rfl: 11 .  Multiple Vitamins-Minerals (MULTIVITAMIN ADULTS 50+) TABS, Take 1 tablet by mouth daily., Disp: , Rfl:  .  potassium chloride SA (KLOR-CON) 20 MEQ tablet, TAKE ONE TABLET BY MOUTH EVERY DAY,  Disp: 90 tablet, Rfl: 1 .  simvastatin (ZOCOR) 20 MG tablet, TAKE ONE TABLET AT BEDTIME, Disp: 90 tablet, Rfl: 0 .  triamcinolone cream (KENALOG) 0.1 %, APPLY 1 APPLICATION TOPICALLY 2 (TWO) TIMES DAILY. AS NEEDED, Disp: 453.6 g, Rfl: 0 .  valsartan-hydrochlorothiazide (DIOVAN-HCT) 320-25 MG tablet, TAKE ONE TABLET EVERY DAY, Disp: 90 tablet, Rfl: 1 .  venlafaxine XR (EFFEXOR-XR) 75 MG 24 hr capsule, TAKE 3 CAPSULES EVERY DAY, Disp: 270 capsule, Rfl: 1   Observations/Objective: Blood pressure 140/84, pulse (!) 108, temperature 98.5 F (36.9 C), temperature source Oral, height 5' 7.5" (1.715 m), weight (!) 536  lb 8 oz (243.4 kg), SpO2 94 %.  Physical Exam Constitutional:      Appearance: He is well-developed. He is obese.     Comments: In wheelchair on oxygen  HENT:     Head: Normocephalic.     Right Ear: Hearing normal.     Left Ear: Hearing normal.     Nose: Nose normal.  Neck:     Thyroid: No thyroid mass or thyromegaly.     Vascular: No carotid bruit.     Trachea: Trachea normal.  Cardiovascular:     Rate and Rhythm: Normal rate and regular rhythm.     Pulses: Normal pulses.     Heart sounds: Heart sounds not distant. No murmur. No friction rub. No gallop.      Comments: B peripheral edema improved in compression hose Pulmonary:     Effort: Pulmonary effort is normal. No respiratory distress.     Breath sounds: Normal breath sounds.  Skin:    General: Skin is warm and dry.     Findings: No rash.  Psychiatric:        Speech: Speech normal.        Behavior: Behavior normal.        Thought Content: Thought content normal.      Assessment and Plan   Morbid obesity with BMI of 70 and over, adult ([place]) Weight increased instead of decreased despite higher dose Trulicity. He does not appetite decrease.. continue current dose.  Refer patient to bariatric clinic... he does not want to see surgeon but instead medical weight management.  Hypoventilation associated with obesity (H[facility]) Oxygen dependent. Serious comorbidity from morbid obesity.  Controlled type 2 diabetes mellitus with complication, without long-term current use of insulin ([place]) At goal last check and with recent CBGs.  Diastolic heart failure Follwoed by cardiology.. reviewed last OV 09/2019     Eliezer Lofts, MD

## 2019-10-22 NOTE — Assessment & Plan Note (Signed)
Weight increased instead of decreased despite higher dose Trulicity. He does not appetite decrease.. continue current dose.  Refer patient to bariatric clinic... he does not want to see surgeon but instead medical weight management.

## 2019-10-25 ENCOUNTER — Ambulatory Visit: Payer: 59 | Admitting: Physician Assistant

## 2019-10-25 ENCOUNTER — Telehealth: Payer: Self-pay | Admitting: Physician Assistant

## 2019-10-25 NOTE — Telephone Encounter (Signed)
Pt message sent that zio results are still in process.

## 2019-10-25 NOTE — Telephone Encounter (Signed)
Patient calling in to check on zio monitor results. Patient was made aware zio received the monitor on 2/24  Please advise

## 2019-10-28 ENCOUNTER — Telehealth: Payer: Self-pay

## 2019-10-28 NOTE — Telephone Encounter (Signed)
Call to patient to review zio results.    Pt verbalized understanding and has no further questions at this time.    Advised pt to call for any further questions or concerns.  No further orders.

## 2019-10-28 NOTE — Telephone Encounter (Signed)
-----   Message from Arvil Chaco, PA-C sent at 10/28/2019  1:25 PM EDT ----- Please let Blake Garcia know that his monitor showed normal sinus rhythm with an average heart rate of 99bpm. He had three episodes of wide complex tachycardia the longest of which lasted 15 beats. This is a faster heart rhythm that originates from the bottom part of the heart. In addition, he had 4 episodes of supraventricular tachycardia or a faster heart rhythm that originates from the top part of the heart. He had occasional extra beats from both the bottom and top part of his heart. These findings could certainly be contributing to his symptoms of atypical chest pain, fatigue, shortness of breath, and dyspnea. Once we get the echo results, and have an idea of the pump function of his heart, we can talk about possible treatment plans / plans of action.

## 2019-11-19 ENCOUNTER — Ambulatory Visit
Admission: RE | Admit: 2019-11-19 | Discharge: 2019-11-19 | Disposition: A | Discharge status: Home / Self Care | Payer: 59 | Source: Ambulatory Visit | Attending: Physician Assistant | Admitting: Physician Assistant

## 2019-11-19 ENCOUNTER — Other Ambulatory Visit: Payer: Self-pay

## 2019-11-19 DIAGNOSIS — I351 Nonrheumatic aortic (valve) insufficiency: Secondary | ICD-10-CM | POA: Insufficient documentation

## 2019-11-19 DIAGNOSIS — I119 Hypertensive heart disease without heart failure: Secondary | ICD-10-CM | POA: Diagnosis not present

## 2019-11-19 DIAGNOSIS — R0600 Dyspnea, unspecified: Secondary | ICD-10-CM | POA: Insufficient documentation

## 2019-11-19 DIAGNOSIS — R0602 Shortness of breath: Secondary | ICD-10-CM | POA: Diagnosis not present

## 2019-11-19 DIAGNOSIS — G4730 Sleep apnea, unspecified: Secondary | ICD-10-CM | POA: Insufficient documentation

## 2019-11-19 NOTE — Progress Notes (Signed)
*  PRELIMINARY RESULTS* Echocardiogram 2D Echocardiogram has been performed.  Sherrie Sport 11/19/2019, 11:37 AM

## 2019-11-22 ENCOUNTER — Telehealth: Payer: Self-pay

## 2019-11-22 NOTE — Telephone Encounter (Signed)
Called to give the patient echo results. lmom with results, results also released to mychart. Patient is to contact the office if any questions.

## 2019-11-22 NOTE — Telephone Encounter (Signed)
-----   Message from Arvil Chaco, PA-C sent at 11/21/2019  4:58 PM EDT ----- Please let Blake Garcia know that his echo showed normal to hyperdynamic pump function at greater than 55%. The walls of the bottom left chamber of his heart have thickened, which can happen with elevated blood pressure. When compared with that of his previous echo, it does appear as if the wall is more thick than in 2016. We can talk about medications to control his blood pressure at our upcoming visit. Unfortunately, the valves of his heart were not seen very well. Overall, however, it appears that his echo is stable when compared with that of 2016, which is reassuring.

## 2019-11-22 NOTE — Telephone Encounter (Signed)
Results reviewed by the patient in my mychart.

## 2019-12-01 NOTE — Progress Notes (Signed)
Office Visit    Patient Name: Blake Garcia Date of Encounter: 12/02/2019  Primary Care Provider:  Jinny Sanders, MD Primary Cardiologist:  Kathlyn Sacramento, MD  Chief Complaint    64 year old male with history of hypertension, diastolic dysfunction, chest pain, sinus tachycardia, and morbid obesity, and who presents for follow-up s/p recent echo and Zio monitor with ongoing fatigue and atypical CP.  Past Medical History    Past Medical History:  Diagnosis Date  . Basal cell carcinoma [id number]   [place]  . Cellulitis 02/09/2012   RLE  . Complication of anesthesia ~ 2000   "during OR for kidney stones; anesthesia RX made me code twice in one week"  . Depression   . Diastolic dysfunction    a. 11/2014 Echo: EF 50-55%, Gr 1 DD.  [name] History of blood transfusion 1958   "I was an Rh baby"  . Hyperlipidemia   . Hypertension   . Kidney stones   . Midsternal chest pain   . Morbid obesity ([place])   . OSA (obstructive sleep apnea)    "wear CPAP"  . Personal history of colonic adenoma 09/14/2012   Past Surgical History:  Procedure Laterality Date  . CHOLECYSTECTOMY  1993  . ESOPHAGOGASTRODUODENOSCOPY    . KNEE ARTHROSCOPY  ~ 2004   right  . LITHOTRIPSY  2000    Allergies  No Known Allergies  History of Present Illness    64 year old male with the above complex past medical history.  He has a history of morbid obesity with associated hypertension and diastolic dysfunction.  He was previously evaluated secondary to chest pain and DOE with echo showing normal LVSF in 2016. 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 Because of his comorbid conditions, he was issued a 1 year card, which was then due for renewal, as he was driving a school bus for [employer].Given his morbid obesity placed him at risk for cardiovascular disease, it was recommended he be seen by cardiology. He was subsequently seen at the [facility] clinic, during which time he  denied CP but noted DOE, ambulating only approximately 300 feet before feeling short of breath and leg weakness. He was using a cane.  Unfortunately, cardiac testing was unable to be provided as outlined in previous notes and given his body habitus, and he was thus unable to receive cardiac clearance for his DOT card.  It was recommended he discuss further weight loss options/bariatric surgery with his PCP.   When seen at follow-up 09/27/19, he reported significant / progressive fatigue. He also noted return of CP, described as similar to previous occurrences and occuring both at rest and with exertion. It was left sided, nonradiating, pleuritic, and brief. It was associated with SOB and lasted 1 to 2 minutes before resolving without clear triggers or alleviating factors.  He recalled a family h/o cardiac events that were always preceded by fatigue. Given his recent fatigue, he was concerned for cardiac dz or arrhythmia. Even if getting a full night of sleep, he was still is exhausted the next day after minimal activity.  He reported CPAP compliance.  He had started chronic oxygen therapy with continued SOB/ DOE.  His did state activity was limited by his body habitus and deconditioning with encouragement to increase activity as tolerated. EKG and rhythm strip performed with frequent PVCs, despite lack of racing heart rate, palpitations, presyncope, or syncope. Lower extremity edema noted on exam without significant symptoms reported.  Since then, he has completed a 2 week Zio monitor as copied below that showed NSR with an average HR of 99bpm. He had 3 episodes of WCT, the longest of which lasted 15 beats. He also had 4 episodes of SVT with longest episode 6 beats. Ectopy reported with PVC2.6% of the time and PACs 1.7% burden He also has had an updated echo that showed nl EF and LVH but with further details unknown 2/2 unclear pictures and thus further echo details. CBC, BMET, Mg, and TSH obtained with results  unrevealing.   He returns to clinic today, 12/29/2019, and reports continued concern regarding unchanged sx of fatigue, SOB/DOE, and atypical CP with rest and exertion. He also notes palpitations, racing HR, dizziness, and associated anxiety concerning his symptoms and known family history.  No LOC or recent falls. He reports CPAP compliance and ongoing O2 use.  No significant orthopnea, PND, abdominal distention, or early satiety. He notes improved lower extremity edema since using compression stockings, which he purchased on [employer] for $15 and is very satisfied with, as the zip of the side.  He denies any signs or symptoms of bleeding.  No recent falls.  He reports medication compliance. *Of note, he reports today that he has unfortunately never had significant work-up for possible bariatric surgery.  He reports that, in the past, he has discussed possibly proceeding with bariatric surgery.  He was at one  time referred to a specialist for both nutrition and lifestyle changes; however, he has never received a referral for weight loss surgery.  He does feel that this would be helpful for him with recommendation that he discuss this with his primary care physician when able.  He continues to feel that some of the symptoms are likely due to deconditioning and decreased exercise tolerance with activity limited by current body habitus.  He reports that he is attempted to eat more healthy, often consuming salads; however, he feels that this has not been very beneficial.    Home Medications    Prior to Admission medications   Medication Sig Start Date End Date Taking? Authorizing Provider  amLODipine (NORVASC) 10 MG tablet TAKE ONE TABLET EVERY DAY 07/03/19   Bedsole, Amy E, MD  aspirin EC 81 MG tablet Take 81 mg by mouth daily.    [provider]  cyclobenzaprine (FLEXERIL) 10 MG tablet TAKE 1 TABLET BY MOUTH AT BEDTIME AS NEEDED FOR MUSCLE SPASMS 09/14/15   Bedsole, Amy E, MD  diclofenac (VOLTAREN) 75  MG EC tablet Take 75 mg by mouth 2 (two) times daily as needed.    [provider]  Dulaglutide (TRULICITY) 1.5 0000000 SOPN Inject 1.5 mg into the skin once a week. 09/20/19   Bedsole, Amy E, MD  Multiple Vitamins-Minerals (MULTIVITAMIN ADULTS 50+) TABS Take 1 tablet by mouth daily.    [provider]  potassium chloride SA (KLOR-CON) 20 MEQ tablet TAKE ONE TABLET BY MOUTH EVERY DAY 07/03/19   Bedsole, Amy E, MD  simvastatin (ZOCOR) 20 MG tablet TAKE ONE TABLET AT BEDTIME 07/03/19   Bedsole, Amy E, MD  triamcinolone cream (KENALOG) 0.1 % APPLY 1 APPLICATION TOPICALLY 2 (TWO) TIMES DAILY. AS NEEDED 05/28/19   Bedsole, Amy E, MD  valsartan-hydrochlorothiazide (DIOVAN-HCT) 320-25 MG tablet TAKE ONE TABLET EVERY DAY 03/26/19   Bedsole, Amy E, MD  venlafaxine XR (EFFEXOR-XR) 75 MG 24 hr capsule TAKE 3 CAPSULES EVERY DAY 07/03/19   Jinny Sanders, MD    Review of Systems  He denies pain orthopnea, n, v, syncope, LOC, weight gain, or early satiety.  He reports unchanged chest pain with exertion and at rest. He notes dizziness, pounding HR/racing HR, palpitations, SOB/dyspnea, edema (improved with use of compression stockings), and fatigue.  All other systems reviewed and are otherwise negative except as noted above.  Physical Exam    VS:  BP 128/82 (BP Location: Left Arm, Patient Position: Sitting, Cuff Size: Large)   Pulse (!) 101   Ht 5\' 7"  (1.702 m)   Wt (!) 531 lb 4 oz (241 kg)   SpO2 95%   BMI 83.21 kg/m  , BMI Body mass index is 83.21 kg/m. GEN: Obese male, in no acute distress.  Seated in wheelchair.  Mask in place. HEENT: normal. Neck: Supple, JVD difficult to assess 2/2 body habitus and facial hair. Carotids difficult to assess 2/2 oxygen and facial hair. No or masses. Cardiac: tachycardic with frequent extrasystole appreciated.  No murmurs, rubs, or gallops. No clubbing, cyanosis. Moderate to 1+ bilateral edema noted with compression stockings in place.  Radials/DP/PT  2+ and equal bilaterally.  Respiratory:  Respirations regular and unlabored, distant breath sounds but otherwise clear to auscultation bilaterally. GI: Soft, nontender, nondistended, BS + x 4. MS: no deformity or atrophy. Skin: warm and dry, no rash. Neuro:  Strength and sensation are intact. Psych: Normal affect.  Accessory Clinical Findings    ECG personally reviewed by me today -sinus tachycardia with fusion complexes, 101 bpm, left axis deviation, bifascicular block,PVCs, QRS 142 ms, PR interval 176 ms,  baseline wander- no acute changes.  VITALS Reviewed   Temp Readings from Last 3 Encounters:  10/22/19 98.5 F (36.9 C) (Oral)  08/23/19 98.4 F (36.9 C) (Temporal)  05/28/19 97.6 F (36.4 C) (Oral)   BP Readings from Last 3 Encounters:  12/02/19 128/82  10/22/19 140/84  09/27/19 132/80   Pulse Readings from Last 3 Encounters:  12/02/19 (!) 101  10/22/19 (!) 108  09/27/19 (!) 110    Wt Readings from Last 3 Encounters:  12/02/19 (!) 531 lb 4 oz (241 kg)  10/22/19 (!) 536 lb 8 oz (243.4 kg)  09/27/19 (!) 524 lb (237.7 kg)     LABS  reviewed   [place] present? Yes/No: No   Lab Results  Component Value Date   WBC 9.2 09/27/2019   HGB 15.5 09/27/2019   HCT 48.7 09/27/2019   MCV 93.7 09/27/2019   PLT 232 09/27/2019   Lab Results  Component Value Date   CREATININE 0.93 09/27/2019   BUN 15 09/27/2019   NA 140 09/27/2019   K 4.0 09/27/2019   CL 98 09/27/2019   CO2 29 09/27/2019   Lab Results  Component Value Date   ALT 17 08/20/2019   AST 16 08/20/2019   ALKPHOS 75 08/20/2019   BILITOT 0.4 08/20/2019   Lab Results  Component Value Date   CHOL 172 08/20/2019   HDL 61.60 08/20/2019   LDLCALC 92 08/20/2019   LDLDIRECT 153.9 09/10/2007   TRIG 90.0 08/20/2019   CHOLHDL 3 08/20/2019    Lab Results  Component Value Date   HGBA1C 6.4 08/20/2019   Lab Results  Component Value Date   TSH 2.457 09/27/2019     STUDIES/PROCEDURES reviewed      Echo 11/19/2019 1. Left ventricular ejection fraction, by estimation, is >55%. The left  ventricle has hyperdynamic function. Left ventricular endocardial border  not optimally defined to evaluate regional wall motion. There is severe  left ventricular  hypertrophy. Left  ventricular diastolic function could not be evaluated.  2. Right ventricular systolic function was not well visualized. The right  ventricular size is not well visualized.  3. The mitral valve was not well visualized. No evidence of mitral valve  regurgitation.  4. The aortic valve was not well visualized. Aortic valve regurgitation  not well assessed.  5. Pulmonic valve regurgitation not well assessed.   Monitor 10/25/2019 Normal sinus rhythm with an average heart rate of 99 bpm. 3 episodes of wide-complex tachycardia the longest lasted 15 beats. 4 SVTs noted the longest lasted 6 beats. Occasional PACs with a burden of 1.7% Occasional PVCs with a burden of 2.6%.  TTE 12/30/2014 - Left ventricle: The cavity size was normal. There was moderate  concentric hypertrophy. Systolic function was normal. The  estimated ejection fraction was in the range of 50% to 55%.  Images were inadequate for LV wall motion assessment. Doppler  parameters are consistent with abnormal left ventricular  relaxation (grade 1 diastolic dysfunction).  Impressions:  - very suboptimal study due to body habitus.  Lower extremity venous duplex 01/2012 Summary:  - No evidence of deep vein or superficial thrombosis  involving the right lower extremity and left common  femoral vein.  - No evidence of Baker's cyst on the right.   Assessment & Plan     Atypical chest pain, continued  Family history of cardiac disease and arrhythmias -Reports ongoing chest pain similar to previous episodes in the past.  Described as brief and pleuritic, lasting only 1 to 2 minutes.  Associated symptoms include SOB/DOE, as well as he has  noted recent progressive fatigue.  Current and future imaging for risk factor stratification / further ischemic workup is limited by current body habitus.  2016 echo as above with EF 50 to 55% and G1DD.  Repeat echo with nl EF and LVH as above. Most recent Zio above with WCT and ectopy with referral to EP, as this could be contributing to his sx. Updated labs from previous visit unrevealing, including CBC, BMET, magnesium, and TSH.  Previous A1c 6.4.  Continued aggressive risk factor modification including weight loss, glycemic control, blood pressure, and lipid control recommended. Pending EP visit, future considerations to include transition from amlodipine to diltiazem given nl EF. Will defer for now and pending EP recommendations.  Recommend ongoing lifestyle modifications. Weight loss surgery discussed with further recommendations / needed referrals per PCP.  Shortness of breath/DOE --Reports ongoing shortness of breath/DOE. Electrolytes and TSH wnl. He is compliant with his O2 and CPAP.  SOB/DOE likely multifactorial in the setting of his ectopy, tachycardia, weight, severe physical deconditioning.  Updated echo as above with nl EF and LVH though image quality poor and thus poor assessment of valves.  Also considered is SOB/DOE due to arrhythmia/ectopy with 2 weeks ZIO XT/cardiac monitoring placed today.  Ordered TSH, CBC, BMET/Mg as well.  Continue oxygen therapy.  Continue current medications.  Lifestyle changes recommended as above. Recommend repeat investigation / bring up to PCP that interested in weight loss surgery at upcoming visit.   PVCs/ Sinus Tachycardia/ bifascicular block --Zio as above with WCT and frequent ectopy, which could contribute to ongoing  fatigue and SOB/DOE, as well as dizziness, pounding HR, and palpitations. He notes a family history of arrhythmia today. Reviewed Zio findings in detail today. Most recent echo as above. Refer to EP given his ongoing sx and recent Zio.    Chronic diastolic heart failure --As above /reported under shortness  of breath/DOE.  Also noted is stable 1+ bilateral lower extremity edema with consideration of current amlodipine/dependent edema. Per previous clinic recommendations, he has obtained and is benefiting from compression stockings.  Euvolemic on exam with weight reported not accurate given wheelchair status. Updated echo with nl EF and LVH. Ongoing BP control recommended.  Continue current medications. Future considerations, including transition from amlodipine to diltiazem, discussed with patient and deferred until after EP recommendations to avoid unnecessary medication changes.    Hypertension -BP today relatively well controlled and improved from previous with BP today 128/82.  Optimal BP control and risk factor modification recommended.  Continue current medications. Consider transition from amlodipine to diltiazem pending EP recommendations.   Hyperlipidemia -Most recent cholesterol panel 08/20/2019 and shows total cholesterol 172, LDL improved from previous and 92, HDL 61.60, triglycerides 90.  Continue simvastatin 20 mg with titration as needed per his PCP and for aggressive risk factor control.     DM2 -08/20/2019 hemoglobin A1c 6.4.  Tight glycemic control recommended for aggressive risk factor modification.  Defer to PCP.  Morbid obesity --Weight loss/lifestyle changes recommended as above and as his current weight places him at an increased risk for cardiac disease. Recommend discuss bariatric surgery / referral with PCP again, given he feels it may have fallen through the cracks previously 2/2 insurance coverage at that time.    OSA on CPAP and O2 --Encouraged ongoing CPAP and oxygen use.   Medication changes: Per patient preference, defer transition to diltiazem and discontinuing amlodipine until EP visit / recommendations to avoid unnecessary frequent medication changes.  Labs ordered: None  Studies /  Imaging ordered: None. Referral to EP provided. Future considerations: Diltiazem  Disposition: RTC after EP visit and as indicated pending EP recommendations.   Arvil Chaco, PA-C 12/02/2019, 3:21 PM

## 2019-12-02 ENCOUNTER — Ambulatory Visit (INDEPENDENT_AMBULATORY_CARE_PROVIDER_SITE_OTHER): Payer: 59 | Admitting: Physician Assistant

## 2019-12-02 ENCOUNTER — Other Ambulatory Visit: Payer: Self-pay

## 2019-12-02 ENCOUNTER — Encounter: Payer: Self-pay | Admitting: Physician Assistant

## 2019-12-02 VITALS — BP 128/82 | HR 101 | Ht 67.0 in | Wt >= 6400 oz

## 2019-12-02 DIAGNOSIS — Z8249 Family history of ischemic heart disease and other diseases of the circulatory system: Secondary | ICD-10-CM

## 2019-12-02 DIAGNOSIS — I493 Ventricular premature depolarization: Secondary | ICD-10-CM | POA: Diagnosis not present

## 2019-12-02 DIAGNOSIS — I5032 Chronic diastolic (congestive) heart failure: Secondary | ICD-10-CM

## 2019-12-02 DIAGNOSIS — Z9989 Dependence on other enabling machines and devices: Secondary | ICD-10-CM

## 2019-12-02 DIAGNOSIS — I452 Bifascicular block: Secondary | ICD-10-CM

## 2019-12-02 DIAGNOSIS — G4733 Obstructive sleep apnea (adult) (pediatric): Secondary | ICD-10-CM

## 2019-12-02 DIAGNOSIS — E785 Hyperlipidemia, unspecified: Secondary | ICD-10-CM

## 2019-12-02 DIAGNOSIS — R0789 Other chest pain: Secondary | ICD-10-CM | POA: Diagnosis not present

## 2019-12-02 DIAGNOSIS — Z6841 Body Mass Index (BMI) 40.0 and over, adult: Secondary | ICD-10-CM

## 2019-12-02 DIAGNOSIS — E1169 Type 2 diabetes mellitus with other specified complication: Secondary | ICD-10-CM

## 2019-12-02 DIAGNOSIS — R5383 Other fatigue: Secondary | ICD-10-CM

## 2019-12-02 DIAGNOSIS — R0602 Shortness of breath: Secondary | ICD-10-CM

## 2019-12-02 DIAGNOSIS — I10 Essential (primary) hypertension: Secondary | ICD-10-CM

## 2019-12-02 DIAGNOSIS — R000 Tachycardia, unspecified: Secondary | ICD-10-CM

## 2019-12-02 NOTE — Patient Instructions (Addendum)
Medication Instructions:  - Your physician recommends that you continue on your current medications as directed. Please refer to the Current Medication list given to you today.  *If you need a refill on your cardiac medications before your next appointment, please call your pharmacy*   Lab Work: - none ordered  If you have labs (blood work) drawn today and your tests are completely normal, you will receive your results only by: [name] MyChart Message (if you have MyChart) OR . A paper copy in the mail If you have any lab test that is abnormal or we need to change your treatment, we will call you to review the results.   Testing/Procedures: - none ordered   Follow-Up: At [facility], you and your health needs are our priority.  As part of our continuing mission to provide you with exceptional heart care, we have created designated Provider Care Teams.  These Care Teams include your primary Cardiologist (physician) and Advanced Practice Providers (APPs -  Physician Assistants and Nurse Practitioners) who all work together to provide you with the care you need, when you need it.  We recommend signing up for the patient portal called "MyChart".  Sign up information is provided on this After Visit Summary.  MyChart is used to connect with patients for Virtual Visits (Telemedicine).  Patients are able to view lab/test results, encounter notes, upcoming appointments, etc.  Non-urgent messages can be sent to your provider as well.   To learn more about what you can do with MyChart, go to [email or web address].    Your next appointment:   1) You are being referred to Dr. Caryl Comes- Cardiac Electrophysiology  2) follow up 1-2 weeks (after 12/26/19 ) with Dr. Fletcher Anon APP  The format for your next appointment:   In Person  Provider:   as above   Other Instructions n/a

## 2019-12-16 ENCOUNTER — Other Ambulatory Visit: Payer: Self-pay | Admitting: Family Medicine

## 2019-12-26 ENCOUNTER — Other Ambulatory Visit: Payer: Self-pay

## 2019-12-26 ENCOUNTER — Ambulatory Visit (INDEPENDENT_AMBULATORY_CARE_PROVIDER_SITE_OTHER): Payer: 59 | Admitting: Internal Medicine

## 2019-12-26 ENCOUNTER — Encounter: Payer: Self-pay | Admitting: Internal Medicine

## 2019-12-26 VITALS — BP 140/90 | HR 110 | Ht 67.0 in | Wt >= 6400 oz

## 2019-12-26 DIAGNOSIS — R000 Tachycardia, unspecified: Secondary | ICD-10-CM

## 2019-12-26 DIAGNOSIS — I493 Ventricular premature depolarization: Secondary | ICD-10-CM

## 2019-12-26 MED ORDER — ATENOLOL 50 MG PO TABS: 50.0000 mg | ORAL_TABLET | Freq: Every day | ORAL | 0 refills | Status: DC

## 2019-12-26 MED ORDER — AMLODIPINE BESYLATE 5 MG PO TABS: 5.0000 mg | ORAL_TABLET | Freq: Every day | ORAL | Status: DC

## 2019-12-26 MED ORDER — METOPROLOL SUCCINATE ER 50 MG PO TB24: 50.0000 mg | ORAL_TABLET | Freq: Every day | ORAL | 0 refills | Status: DC

## 2019-12-26 MED ORDER — METOPROLOL SUCCINATE ER 50 MG PO TB24
50.0000 mg | ORAL_TABLET | Freq: Every day | ORAL | 0 refills | Status: DC
Start: 2019-12-26 — End: 2019-12-26

## 2019-12-26 MED ORDER — BISOPROLOL FUMARATE 5 MG PO TABS: 5.0000 mg | ORAL_TABLET | Freq: Every day | ORAL | 0 refills | Status: DC

## 2019-12-26 MED ORDER — ATENOLOL 50 MG PO TABS: 50.0000 mg | ORAL_TABLET | Freq: Two times a day (BID) | ORAL | 0 refills | Status: DC

## 2019-12-26 NOTE — Progress Notes (Signed)
ELECTROPHYSIOLOGY CONSULT NOTE  Patient ID: Blake Garcia, MRN: [id number], DOB/AGE: 02-Aug-1955 64 y.o. Admit date: (Not on file) Date of Consult: 12/26/2019  Primary Physician: Jinny Sanders, MD Primary Cardiologist: MA     Blake Garcia is a 64 y.o. male who is being seen today for the evaluation of *VT NS at the request ofMA   HPI Blake Garcia is a 64 y.o. male Morbidly obese limited largely now to wheel chair with exertional tachypalpitations and sob; chronic edema. Some chest pains-atypical   Because of palps, he underwent event recording, Personally reviewed  Showing symptoms assoc with PVC and sinus tach   There was a series of beats wsith variable  Morphologies, starting with a PVC but looks like PVCs with fusion beats , ie AIVR  No syncope   DOE < 20 feets; able to wash himself; O2 dependent cCOPD and rx OSA w CPAP   Long standing HTN  DATE TEST EF   6/16 Echo   50-55 % 14/11 --suboptimal  4/21 Echo   >55 % LVH severe 16/47mm LAE 4.5cm(poorly visualized)        Date Cr K TSH Hgb  2/21 0.93 4.0 2.457 15.5           Event Recorder personnally reviewed    Past Medical History:  Diagnosis Date  . Basal cell carcinoma [id number]   [place]  . Cellulitis 02/09/2012   RLE  . Complication of anesthesia ~ 2000   "during OR for kidney stones; anesthesia RX made me code twice in one week"  . Depression   . Diastolic dysfunction    a. 11/2014 Echo: EF 50-55%, Gr 1 DD.  [name] History of blood transfusion 1958   "I was an Rh baby"  . Hyperlipidemia   . Hypertension   . Kidney stones   . Midsternal chest pain   . Morbid obesity ([place])   . OSA (obstructive sleep apnea)    "wear CPAP"  . Personal history of colonic adenoma 09/14/2012      Surgical History:  Past Surgical History:  Procedure Laterality Date  . CHOLECYSTECTOMY  1993  . ESOPHAGOGASTRODUODENOSCOPY    . KNEE ARTHROSCOPY  ~ 2004   right  . LITHOTRIPSY  2000     Home Meds:  Current Meds  Medication Sig  . aspirin EC 81 MG tablet Take 81 mg by mouth daily.  . cyclobenzaprine (FLEXERIL) 10 MG tablet TAKE 1 TABLET BY MOUTH AT BEDTIME AS NEEDED FOR MUSCLE SPASMS  . diclofenac (VOLTAREN) 75 MG EC tablet Take 75 mg by mouth 2 (two) times daily as needed.  . Dulaglutide (TRULICITY) 1.5 0000000 SOPN Inject 1.5 mg into the skin once a week.  . Multiple Vitamins-Minerals (MULTIVITAMIN ADULTS 50+) TABS Take 1 tablet by mouth daily.  . potassium chloride SA (KLOR-CON) 20 MEQ tablet TAKE ONE TABLET BY MOUTH EVERY DAY  . simvastatin (ZOCOR) 20 MG tablet TAKE ONE TABLET AT BEDTIME  . triamcinolone cream (KENALOG) 0.1 % APPLY 1 APPLICATION TOPICALLY 2 (TWO) TIMES DAILY. AS NEEDED  . valsartan-hydrochlorothiazide (DIOVAN-HCT) 320-25 MG tablet TAKE ONE TABLET EVERY DAY  . venlafaxine XR (EFFEXOR-XR) 75 MG 24 hr capsule TAKE 3 CAPSULES EVERY DAY  . [DISCONTINUED] amLODipine (NORVASC) 10 MG tablet TAKE ONE TABLET EVERY DAY    Allergies: No Known Allergies  Social History   Socioeconomic History  . Marital status: Divorced    Spouse name: Not on file  . Number  of children: 3  . Years of education: Not on file  . Highest education level: Some college, no degree  Occupational History  . Occupation: not employed  Tobacco Use  . Smoking status: Never Smoker  . Smokeless tobacco: Never Used  Substance and Sexual Activity  . Alcohol use: No    Alcohol/week: 0.0 standard drinks    Comment: 02/09/12 "may have driink at party once/year"  . Drug use: No  . Sexual activity: Not Currently  Other Topics Concern  . Not on file  Social History Narrative   Regular exercise-- walks one time a week      Diet: 2 meals-skips dinner, cereal,salad, soda(cheerwine diet)   Social Determinants of Health   Financial Resource Strain:   . Difficulty of Paying Living Expenses:   Food Insecurity:   . Worried About Charity fundraiser in the Last Year:   . Arboriculturist in the Last  Year:   Transportation Needs:   . Film/video editor (Medical):   [name] Lack of Transportation (Non-Medical):   Physical Activity:   . Days of Exercise per Week:   . Minutes of Exercise per Session:   Stress:   . Feeling of Stress :   Social Connections:   . Frequency of Communication with Friends and Family:   . Frequency of Social Gatherings with Friends and Family:   . Attends Religious Services:   . Active Member of Clubs or Organizations:   . Attends Archivist Meetings:   [name] Marital Status:   Intimate Partner Violence:   . Fear of Current or Ex-Partner:   . Emotionally Abused:   [name] Physically Abused:   . Sexually Abused:      Family History  Problem Relation Age of Onset  . Hypertension Mother   . Heart disease Mother   . Heart disease Father   . Stroke Father   . Heart disease Brother   . Coronary artery disease Brother   . Diabetes Brother   . Cancer Maternal Aunt        breast  . Cancer Maternal Uncle        lung  . Heart disease Brother   . Coronary artery disease Brother   . ADD / ADHD Son   . ADD / ADHD Son   . ADD / ADHD Daughter      ROS:  Please see the history of present illness.     All other systems reviewed and negative.    Physical Exam:  Blood pressure 140/90, pulse (!) 110, height 5\' 7"  (1.702 m), weight (!) 532 lb (241.3 kg), SpO2 (!) 86 %. General: Well developed Morbidly obese  male in no acute distress.wearing O2 Head: Normocephalic, atraumatic, sclera non-icteric, no xanthomas, nares are without discharge. EENT: normal  Lymph Nodes:  none Neck: Negative for carotid bruits. JVD not appreciated  Sitting in wheel chair Back:without scoliosis kyphosis  Lungs: Clear bilaterally to auscultation without wheezes, rales, or rhonchi. Breathing is unlabored. Heart: Rapid RR with S1 S2. No  murmur . No rubs, or gallops appreciated. Abdomen: Soft,   Extremities: No clubbing or cyanosis.  1+ edema.  Distal pedal pulses are 2+ and equal  bilaterally. Skin: Warm and Dry Neuro: Alert and oriented X 3. CN III-XII intact Grossly normal sensory and motor function . Psych:  Responds to questions appropriately with a normal affect.      Labs: Cardiac Enzymes No results for input(s): CKTOTAL, CKMB, TROPONINI in the  last 72 hours. CBC Lab Results  Component Value Date   WBC 9.2 09/27/2019   HGB 15.5 09/27/2019   HCT 48.7 09/27/2019   MCV 93.7 09/27/2019   PLT 232 09/27/2019   PROTIME: No results for input(s): LABPROT, INR in the last 72 hours. Chemistry No results for input(s): NA, K, CL, CO2, BUN, CREATININE, CALCIUM, PROT, BILITOT, ALKPHOS, ALT, AST, GLUCOSE in the last 168 hours.  Invalid input(s): LABALBU Lipids Lab Results  Component Value Date   CHOL 172 08/20/2019   HDL 61.60 08/20/2019   LDLCALC 92 08/20/2019   TRIG 90.0 08/20/2019   BNP No results found for: PROBNP Thyroid Function Tests: No results for input(s): TSH, T4TOTAL, T3FREE, THYROIDAB in the last 72 hours.  Invalid input(s): FREET3 Miscellaneous No results found for: DDIMER  Radiology/Studies:  No results found.  EKG: sinus Tachy 110 16/13/45   Assessment and Plan:  Palpitations--  PVC  Sinus tachycardia  Hypertrophic heart disease  Hypertension/hypertensive heart disease  HFpEF  Sleep apnea on CPAP   Morbid obesity  O2 dependence   Patient has palpitations related to PVCs and sinus tachycardia.  With his hypertension, we will decrease his amlodipine and try him on beta-blocker.  Have given her prescription for atenolol 50, metoprolol succinate 50 bisoprolol 5.  Sinus tachycardia seems to be related to effort.  Not a big surprise given the size and oxygen dependence.  The wide-complex tachycardia looks to be some combination of ventricular ectopy and possible fusion.  In the context of near normal LV function, the prognostic implications are minimal.  His hypertrophic heart disease likely is related to hypertension  and morbid obesity; it is possible is related to hypertrophic cardiomyopathy; however, there is no family history and imaging-cMRI-is precluded by his weight.  Lengthy discussion about the importance of weight loss.  I encouraged him to consider CUBII, water aerobics and weight loss reduction surgery.   Blake Garcia

## 2019-12-26 NOTE — Patient Instructions (Addendum)
Medication Instructions:  - Your physician has recommended you make the following change in your medication:   1) DECREASE amlodipine (norvasc) to 5 mg- take 1 tablet by mouth once daily - you may use up your current prescription of 10 mg tablets- take 0.5 tablet once daily until you use these up - call us when you are ready for [name] to send you in a 5 mg tablet  2) You are being given prescriptions for 3 different beta blockers to try. You may take them in any order, but DO NOT take more than 1 at a time. Try to give at least 2 weeks on each medication to let your body adjust to them. If you find one that you like the best, please call and let us know and we will sent a prescription in for you.  - Atenolol 50 mg: take 1 tablet by mouth once daily - Metoprolol succinate 50 mg- take 1 tablet by mouth once daily - Bisoprolol 5 mg- take 1 tablet by mouth once daily    *If you need a refill on your cardiac medications before your next appointment, please call your pharmacy*   Lab Work: - none ordered  If you have labs (blood work) drawn today and your tests are completely normal, you will receive your results only by: [name] MyChart Message (if you have MyChart) OR . A paper copy in the mail If you have any lab test that is abnormal or we need to change your treatment, we will call you to review the results.   Testing/Procedures: - none ordered   Follow-Up: At [facility], you and your health needs are our priority.  As part of our continuing mission to provide you with exceptional heart care, we have created designated Provider Care Teams.  These Care Teams include your primary Cardiologist (physician) and Advanced Practice Providers (APPs -  Physician Assistants and Nurse Practitioners) who all work together to provide you with the care you need, when you need it.  We recommend signing up for the patient portal called "MyChart".  Sign up information is provided on this After Visit Summary.   MyChart is used to connect with patients for Virtual Visits (Telemedicine).  Patients are able to view lab/test results, encounter notes, upcoming appointments, etc.  Non-urgent messages can be sent to your provider as well.   To learn more about what you can do with MyChart, go to [email or web address].    Your next appointment:   As needed   The format for your next appointment:   In Person  Provider:   Virl Axe, MD   Other Instructions n/a

## 2020-01-09 ENCOUNTER — Ambulatory Visit: Payer: 59 | Admitting: Family

## 2020-01-20 ENCOUNTER — Other Ambulatory Visit: Payer: Self-pay | Admitting: Internal Medicine

## 2020-02-07 ENCOUNTER — Ambulatory Visit: Payer: 59 | Admitting: Family

## 2020-02-10 LAB — HM DIABETES FOOT EXAM

## 2020-02-10 NOTE — Progress Notes (Signed)
Office Visit    Patient Name: Blake Garcia Date of Encounter: 02/11/2020  Primary Care Provider:  Jinny Sanders, MD Primary Cardiologist:  Kathlyn Sacramento, MD Electrophysiologist:  None   Chief Complaint    Blake Garcia is a 64 y.o. male with a hx of HTN, diastolic dysfunction, chest pain, sinus tachycardia, PVCs, morbid obesity presents today for follow up after addition of beta blocker.   Past Medical History    Past Medical History:  Diagnosis Date   Basal cell carcinoma [id number]   [facility]   Cellulitis 9/67/8938   RLE   Complication of anesthesia ~ 2000   "during OR for kidney stones; anesthesia RX made me code twice in one week"   Depression    Diastolic dysfunction    a. 11/2014 Echo: EF 50-55%, Gr 1 DD.   History of blood transfusion 1958   "I was an Rh baby"   Hyperlipidemia    Hypertension    Kidney stones    Midsternal chest pain    Morbid obesity ([facility])    OSA (obstructive sleep apnea)    "wear CPAP"   Personal history of colonic adenoma 09/14/2012   Past Surgical History:  Procedure Laterality Date   CHOLECYSTECTOMY  1993   ESOPHAGOGASTRODUODENOSCOPY     KNEE ARTHROSCOPY  ~ 2004   right   LITHOTRIPSY  2000    Allergies  No Known Allergies  History of Present Illness    Blake Garcia is a 64 y.o. male with a hx of HTN, diastolic dysfunction, chest pain, sinus tachycardia, PVCs, morbid obesity, OSA on CPAP. He was last seen 12/26/19 by Dr. Caryl Comes.  Previous evaluation includes echo in 2016 with normal LVEF. 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 Testing was limited due to body habitus and he was not provided clearance for DOT.   Seen 09/27/19 reporting progressive fatigue, chest pain with rest and exertion. Noted that for multiple family members fatigue was an anginal equivalent. He wore ZIO monitor with NSR average HR 99 bpm with 3 episodes of wide-complex tachycardia longest 15 beats, 4  runs of SVT, occasional PAC 1.7% burden, and occasional PVC burden 2.6% burden. He had echo 11/19/19 with LVEF >55%, severe LVH, but further details unclear due to unclear imaging. CBC, Mg, BMET, TSH were unrevealing.   Clinic 5/3/21noted continued fatigue, DOE, chest pain at rest and with exertion, palpitations, anxiety, dizziness. Improved LE edema since addition of compression stockings.   He was seen by EP 12/26/19. His amlodipine was reduced and he was trialed on beta blocker His tachycardia was thought to be related to effort. Wide complex tachycardia noted on Zio though to be combination of ventricular ectopy and possible fusion. Hypertrophic heart disease presumed likely due to HTN and morbid obesity. Possibility of HCM however no noted family history and cMRI precluded by weight.   He was given three different beta blockers to try with instructions to do so one at time - Atenolol, Metoprolol, Bisoprolol.  He has been taking the metoprolol with good response.  Reports home heart rates are routinely in the 80s.  Reports blood pressure well controlled at home.  Reports his shortness of breath is stable at his baseline.  He denies chest pain, pressure, tightness.  He is working with [facility] for work-up for bariatric surgery.  EKGs/Labs/Other Studies Reviewed:   The following studies were reviewed today: Echo 11/19/2019 1. Left ventricular ejection fraction, by estimation,  is >55%. The left  ventricle has hyperdynamic function. Left ventricular endocardial border  not optimally defined to evaluate regional wall motion. There is severe  left ventricular hypertrophy. Left  ventricular diastolic function could not be evaluated.   2. Right ventricular systolic function was not well visualized. The right  ventricular size is not well visualized.   3. The mitral valve was not well visualized. No evidence of mitral valve  regurgitation.   4. The aortic valve was not well visualized. Aortic valve  regurgitation  not well assessed.   5. Pulmonic valve regurgitation not well assessed.    Monitor 10/25/2019 Normal sinus rhythm with an average heart rate of 99 bpm. 3 episodes of wide-complex tachycardia the longest lasted 15 beats. 4 SVTs noted the longest lasted 6 beats. Occasional PACs with a burden of 1.7% Occasional PVCs with a burden of 2.6%.   TTE 12/30/2014 - Left ventricle: The cavity size was normal. There was moderate    concentric hypertrophy. Systolic function was normal. The    estimated ejection fraction was in the range of 50% to 55%.    Images were inadequate for LV wall motion assessment. Doppler    parameters are consistent with abnormal left ventricular    relaxation (grade 1 diastolic dysfunction).  Impressions:  - very suboptimal study due to body habitus.   Lower extremity venous duplex 01/2012 Summary:  - No evidence of deep vein or superficial thrombosis    involving the right lower extremity and left common    femoral vein.  - No evidence of Baker's cyst on the right.   EKG:  EKG is ordered today.  The ekg ordered today demonstrates ST 103 bpm with left axis deviation, LBBB, LVH with repolarization abnormality - stable compared to previous  Recent Labs: 08/20/2019: ALT 17 09/27/2019: BUN 15; Creatinine, Ser 0.93; Hemoglobin 15.5; Magnesium 2.2; Platelets 232; Potassium 4.0; Sodium 140; TSH 2.457  Recent Lipid Panel    Component Value Date/Time   CHOL 172 08/20/2019 0841   TRIG 90.0 08/20/2019 0841   TRIG 130 08/10/2006 1022   HDL 61.60 08/20/2019 0841   CHOLHDL 3 08/20/2019 0841   VLDL 18.0 08/20/2019 0841   LDLCALC 92 08/20/2019 0841   LDLDIRECT 153.9 09/10/2007 1029    Home Medications   Current Meds  Medication Sig   amLODipine (NORVASC) 5 MG tablet Take 1 tablet (5 mg total) by mouth daily.   aspirin EC 81 MG tablet Take 81 mg by mouth daily.   cyclobenzaprine (FLEXERIL) 10 MG tablet TAKE 1 TABLET BY MOUTH AT BEDTIME AS NEEDED FOR  MUSCLE SPASMS   diclofenac (VOLTAREN) 75 MG EC tablet Take 75 mg by mouth 2 (two) times daily as needed.   Dulaglutide (TRULICITY) 1.5 DD/2.2GU SOPN Inject 1.5 mg into the skin once a week.   metoprolol succinate (TOPROL-XL) 50 MG 24 hr tablet TAKE ONE TABLET EVERY DAY WITH OR IMMEDIATELY FOLLOWING A MEAL   Multiple Vitamins-Minerals (MULTIVITAMIN ADULTS 50+) TABS Take 1 tablet by mouth daily.   potassium chloride SA (KLOR-CON) 20 MEQ tablet TAKE ONE TABLET BY MOUTH EVERY DAY   simvastatin (ZOCOR) 20 MG tablet TAKE ONE TABLET AT BEDTIME   triamcinolone cream (KENALOG) 0.1 % APPLY 1 APPLICATION TOPICALLY 2 (TWO) TIMES DAILY. AS NEEDED   valsartan-hydrochlorothiazide (DIOVAN-HCT) 320-25 MG tablet TAKE ONE TABLET EVERY DAY   venlafaxine XR (EFFEXOR-XR) 75 MG 24 hr capsule TAKE 3 CAPSULES EVERY DAY   [DISCONTINUED] atenolol (TENORMIN) 50 MG tablet Take 1  tablet (50 mg total) by mouth daily.   [DISCONTINUED] bisoprolol (ZEBETA) 5 MG tablet Take 1 tablet (5 mg total) by mouth daily.      Review of Systems   Review of Systems  Constitutional: Negative for chills, fever and malaise/fatigue.  Cardiovascular: Negative for chest pain, dyspnea on exertion, leg swelling, near-syncope, orthopnea, palpitations and syncope.  Respiratory: Negative for cough, shortness of breath and wheezing.   Gastrointestinal: Negative for nausea and vomiting.  Neurological: Negative for dizziness, light-headedness and weakness.   All other systems reviewed and are otherwise negative except as noted above.  Physical Exam    VS:  BP 130/88 (BP Location: Right Wrist, Patient Position: Sitting, Cuff Size: Normal)    Pulse (!) 103    Ht 5\' 7"  (1.702 m)    Wt (!) 537 lb (243.6 kg)    SpO2 97%    BMI 84.11 kg/m  , BMI Body mass index is 84.11 kg/m. GEN: Well nourished, overweight, well developed, in no acute distress. HEENT: normal. Neck: Supple, no JVD, carotid bruits, or masses. Cardiac: RRR, no murmurs,  rubs, or gallops. No clubbing, cyanosis, edema.  Radials/DP/PT 2+ and equal bilaterally.  Respiratory:  Respirations regular and unlabored, clear to auscultation bilaterally.  Diminished bases bilaterally likely due to body habitus. GI: Soft, nontender, nondistended, BS + x 4. MS: No deformity or atrophy. Skin: Warm and dry, no rash. Neuro:  Strength and sensation are intact. Psych: Normal affect.  Assessment & Plan    1. Atypical chest pain -no recurrent chest pain, pressure, tightness.  EKG today no acute ST/T wave changes.  Ischemic eval limited by body habitus.  No indication for ischemic eval at this time.  Continue primary prevention. 2. SOB/DOE -reports this is stable at his baseline.  Likely multifactorial deconditioning, obesity, OSA.  Continue home oxygen.  Recent TSH, CBC, BMP, magnesium unrevealing.  No further evaluation indicated at this time. 3. PVC/Sinus tachycardia/Bifasicular block -reports home heart rate 80 bpm since addition of metoprolol 50 mg daily.  He was mildly tachycardic today 103 bpm but tells me he had a "busy morning ".  We discussed that he will call the office if his heart rate is routinely greater than 60 bpm we can consider increased dose of metoprolol. 4. HTN - BP well controlled. Continue current antihypertensive regimen.  5. HLD -lipid panel 08/20/2019 LDL 92.  Continue simvastatin 20 mg daily. 6. OSA on CPAP and O2 -encouraged continued compliance with CPAP. 7. Morbid obesity -weight loss via diet and exercise encouraged.  Recently established with provider at [facility] for consideration of bariatric surgery.  Disposition: Follow up in 4 month(s) with Dr. Fletcher Anon or APP.   Loel Dubonnet, NP 02/11/2020, 4:52 PM

## 2020-02-11 ENCOUNTER — Encounter: Payer: Self-pay | Admitting: Family

## 2020-02-11 ENCOUNTER — Ambulatory Visit (INDEPENDENT_AMBULATORY_CARE_PROVIDER_SITE_OTHER): Payer: 59 | Admitting: Family

## 2020-02-11 ENCOUNTER — Other Ambulatory Visit: Payer: Self-pay

## 2020-02-11 VITALS — BP 130/88 | HR 103 | Ht 67.0 in | Wt >= 6400 oz

## 2020-02-11 DIAGNOSIS — R0602 Shortness of breath: Secondary | ICD-10-CM | POA: Diagnosis not present

## 2020-02-11 DIAGNOSIS — I493 Ventricular premature depolarization: Secondary | ICD-10-CM | POA: Diagnosis not present

## 2020-02-11 DIAGNOSIS — G4733 Obstructive sleep apnea (adult) (pediatric): Secondary | ICD-10-CM

## 2020-02-11 DIAGNOSIS — R000 Tachycardia, unspecified: Secondary | ICD-10-CM | POA: Diagnosis not present

## 2020-02-11 DIAGNOSIS — I10 Essential (primary) hypertension: Secondary | ICD-10-CM

## 2020-02-11 DIAGNOSIS — Z9989 Dependence on other enabling machines and devices: Secondary | ICD-10-CM

## 2020-02-11 DIAGNOSIS — E782 Mixed hyperlipidemia: Secondary | ICD-10-CM

## 2020-02-11 NOTE — Patient Instructions (Signed)
Medication Instructions:  No medication changes today.   *If you need a refill on your cardiac medications before your next appointment, please call your pharmacy*  Lab Work: No lab work today.  Testing/Procedures: None ordered today.   Your EKG today was stable compared to previous.   Follow-Up: At [facility], you and your health needs are our priority.  As part of our continuing mission to provide you with exceptional heart care, we have created designated Provider Care Teams.  These Care Teams include your primary Cardiologist (physician) and Advanced Practice Providers (APPs -  Physician Assistants and Nurse Practitioners) who all work together to provide you with the care you need, when you need it.  We recommend signing up for the patient portal called "MyChart".  Sign up information is provided on this After Visit Summary.  MyChart is used to connect with patients for Virtual Visits (Telemedicine).  Patients are able to view lab/test results, encounter notes, upcoming appointments, etc.  Non-urgent messages can be sent to your provider as well.   To learn more about what you can do with MyChart, go to [email or web address].    Your next appointment:   3-4 month(s)  The format for your next appointment:   In Person  Provider:   You may see Kathlyn Sacramento, MD or one of the following Advanced Practice Providers on your designated Care Team:    Murray Hodgkins, NP  Christell Faith, PA-C  Marrianne Mood, PA-C  Laurann Montana, NP  Other Instructions  If your heart rate is consistently more than 90 beats per minute, please call our office. If it is consistently high we will consider increasing your dose of Metoprolol to help with your tachycardia (fast heart rate).

## 2020-02-12 ENCOUNTER — Telehealth: Payer: Self-pay | Admitting: Family Medicine

## 2020-02-12 DIAGNOSIS — E118 Type 2 diabetes mellitus with unspecified complications: Secondary | ICD-10-CM

## 2020-02-12 NOTE — Telephone Encounter (Signed)
-----   Message from Cloyd Stagers, RT sent at 02/12/2020  1:12 PM EDT ----- Regarding: Lab Orders for Thursday 7.15.2021 Please place lab orders for Thursday 7.15.2021, office visit for 6 month f/u on Thursday 7.22.2021 Thank you, Dyke Maes RT(R)

## 2020-02-13 ENCOUNTER — Other Ambulatory Visit: Payer: Self-pay

## 2020-02-13 ENCOUNTER — Other Ambulatory Visit (INDEPENDENT_AMBULATORY_CARE_PROVIDER_SITE_OTHER): Payer: 59

## 2020-02-13 DIAGNOSIS — E118 Type 2 diabetes mellitus with unspecified complications: Secondary | ICD-10-CM

## 2020-02-13 LAB — LIPID PANEL
Cholesterol: 151 mg/dL (ref 0–200)
HDL: 61.6 mg/dL (ref >39.00)
LDL Cholesterol: 73 mg/dL (ref 0–99)
NonHDL: 89.29
Total CHOL/HDL Ratio: 2
Triglycerides: 83 mg/dL (ref 0.0–149.0)
VLDL: 16.6 mg/dL (ref 0.0–40.0)

## 2020-02-13 LAB — COMPREHENSIVE METABOLIC PANEL
ALT: 15 U/L (ref 0–53)
AST: 15 U/L (ref 0–37)
Albumin: 4 g/dL (ref 3.5–5.2)
Alkaline Phosphatase: 67 U/L (ref 39–117)
BUN: 17 mg/dL (ref 6–23)
CO2: 32 mEq/L (ref 19–32)
Calcium: 9.2 mg/dL (ref 8.4–10.5)
Chloride: 97 mEq/L (ref 96–112)
Creatinine, Ser: 0.98 mg/dL (ref 0.40–1.50)
GFR: 77.16 mL/min (ref >60.00)
Glucose, Bld: 160 mg/dL — ABNORMAL HIGH (ref 70–99)
Potassium: 3.1 mEq/L — ABNORMAL LOW (ref 3.5–5.1)
Sodium: 140 mEq/L (ref 135–145)
Total Bilirubin: 0.5 mg/dL (ref 0.2–1.2)
Total Protein: 7.4 g/dL (ref 6.0–8.3)

## 2020-02-13 LAB — HEMOGLOBIN A1C: Hgb A1c MFr Bld: 6.2 % (ref 4.6–6.5)

## 2020-02-14 ENCOUNTER — Other Ambulatory Visit: Payer: 59

## 2020-02-20 ENCOUNTER — Encounter: Payer: Self-pay | Admitting: Family Medicine

## 2020-02-20 ENCOUNTER — Other Ambulatory Visit: Payer: Self-pay

## 2020-02-20 ENCOUNTER — Ambulatory Visit (INDEPENDENT_AMBULATORY_CARE_PROVIDER_SITE_OTHER): Payer: 59 | Admitting: Family Medicine

## 2020-02-20 VITALS — BP 152/100 | HR 106 | Temp 97.4°F | Ht 67.5 in | Wt >= 6400 oz

## 2020-02-20 DIAGNOSIS — E1159 Type 2 diabetes mellitus with other circulatory complications: Secondary | ICD-10-CM | POA: Diagnosis not present

## 2020-02-20 DIAGNOSIS — E876 Hypokalemia: Secondary | ICD-10-CM | POA: Insufficient documentation

## 2020-02-20 DIAGNOSIS — E118 Type 2 diabetes mellitus with unspecified complications: Secondary | ICD-10-CM | POA: Diagnosis not present

## 2020-02-20 DIAGNOSIS — Z6841 Body Mass Index (BMI) 40.0 and over, adult: Secondary | ICD-10-CM

## 2020-02-20 DIAGNOSIS — I10 Essential (primary) hypertension: Secondary | ICD-10-CM

## 2020-02-20 DIAGNOSIS — E1169 Type 2 diabetes mellitus with other specified complication: Secondary | ICD-10-CM

## 2020-02-20 DIAGNOSIS — E662 Morbid (severe) obesity with alveolar hypoventilation: Secondary | ICD-10-CM

## 2020-02-20 DIAGNOSIS — E785 Hyperlipidemia, unspecified: Secondary | ICD-10-CM

## 2020-02-20 LAB — MAGNESIUM: Magnesium: 2 mg/dL (ref 1.5–2.5)

## 2020-02-20 LAB — POTASSIUM: Potassium: 3.5 mEq/L (ref 3.5–5.1)

## 2020-02-20 MED ORDER — TRULICITY 3 MG/0.5ML ~~LOC~~ SOAJ
3.0000 mg | SUBCUTANEOUS | 11 refills | Status: DC
Start: 2020-02-20 — End: 2020-12-10

## 2020-02-20 NOTE — Assessment & Plan Note (Addendum)
Well-controlled at home.  Continue current medication. 

## 2020-02-20 NOTE — Progress Notes (Signed)
Chief Complaint  Patient presents with  . Follow-up    6 month  . Calf Pain    when walking    History of Present Illness: HPI  64 year old morbidly obese male presents for 6 month follow up.  Diabetes:   Good control with trulicity Lab Results  Component Value Date   HGBA1C 6.2 02/13/2020  Using medications without difficulties: Hypoglycemic episodes: Hyperglycemic episodes: Feet problems: Blood Sugars averaging: eye exam within last year: DUE Wt Readings from Last 3 Encounters:  02/20/20 (!) 541 lb 8 oz (245.6 kg)  02/11/20 (!) 537 lb (243.6 kg)  12/26/19 (!) 532 lb (241.3 kg)   Morbid obesity .. he has had a consult with [facility] Dr. Wynonia Lawman.  OSA on CPAP  Hypertension:    Elevated today in office but recently and at home well controlled.  On amlodipine, metoprolol, valsartan HCTZ He thinks BP elevated given issue getting here today. BP Readings from Last 3 Encounters:  02/20/20 (!) 152/100  02/11/20 130/88  12/26/19 140/90  Using medication without problems or lightheadedness: none Chest pain with exertion:none Edema: stable Short of breath:yes on oxygen Average home BPs: 130/80s Other issues: Followed by cardiology for  Sinus tachy,PVCs...  02/11/2020 Dr. Fletcher Anon and C. Walker, NP ( reviewed note in detail) HR good response to  Metoprolol.  At home HRs in 80s.   He plans on getting diabetic shoes.  Elevated Cholesterol:  LDL at goal on simvastatin Lab Results  Component Value Date   CHOL 151 02/13/2020   HDL 61.60 02/13/2020   LDLCALC 73 02/13/2020   LDLDIRECT 153.9 09/10/2007   TRIG 83.0 02/13/2020   CHOLHDL 2 02/13/2020  Using medications without problems: Muscle aches: none Diet compliance: moderate Exercise: minimal Other complaints:  Hypokalemia on recent labs K 3.1  Using 20 Meq .. has been on BID x 1 week.  This visit occurred during the SARS-CoV-2 public health emergency.  Safety protocols were in place, including screening  questions prior to the visit, additional usage of staff PPE, and extensive cleaning of exam room while observing appropriate contact time as indicated for disinfecting solutions.    Calf pain with walking, cramping  In last week.[name]  No change in swelling  COVID 19 screen:  No recent travel or known exposure to [place] The patient denies respiratory symptoms of COVID 19 at this time. The importance of social distancing was discussed today.     Review of Systems  Constitutional: Negative for chills and fever.  HENT: Negative for congestion and ear pain.   Eyes: Negative for pain and redness.  Respiratory: Positive for shortness of breath. Negative for cough.   Cardiovascular: Negative for chest pain, palpitations and leg swelling.  Gastrointestinal: Negative for abdominal pain, blood in stool, constipation, diarrhea, nausea and vomiting.  Genitourinary: Negative for dysuria.  Musculoskeletal: Positive for joint pain. Negative for falls and myalgias.  Skin: Negative for rash.  Neurological: Negative for dizziness.  Psychiatric/Behavioral: Negative for depression. The patient is not nervous/anxious.       Past Medical History:  Diagnosis Date  . Basal cell carcinoma [id number]   [place]  . Cellulitis 02/09/2012   RLE  . Complication of anesthesia ~ 2000   "during OR for kidney stones; anesthesia RX made me code twice in one week"  . Depression   . Diastolic dysfunction    a. 11/2014 Echo: EF 50-55%, Gr 1 DD.  [name] History of blood transfusion 1958   "I  was an Rh baby"  . Hyperlipidemia   . Hypertension   . Kidney stones   . Midsternal chest pain   . Morbid obesity ([place])   . OSA (obstructive sleep apnea)    "wear CPAP"  . Personal history of colonic adenoma 09/14/2012    reports that he has never smoked. He has never used smokeless tobacco. He reports that he does not drink alcohol and does not use drugs.   Current Outpatient Medications:  .  amLODipine (NORVASC) 5 MG  tablet, Take 1 tablet (5 mg total) by mouth daily., Disp: , Rfl:  .  aspirin EC 81 MG tablet, Take 81 mg by mouth daily., Disp: , Rfl:  .  cyclobenzaprine (FLEXERIL) 10 MG tablet, TAKE 1 TABLET BY MOUTH AT BEDTIME AS NEEDED FOR MUSCLE SPASMS, Disp: 15 tablet, Rfl: 0 .  diclofenac (VOLTAREN) 75 MG EC tablet, Take 75 mg by mouth 2 (two) times daily as needed., Disp: , Rfl:  .  Dulaglutide (TRULICITY) 1.5 XV/4.0GQ SOPN, Inject 1.5 mg into the skin once a week., Disp: 3 mL, Rfl: 11 .  metoprolol succinate (TOPROL-XL) 50 MG 24 hr tablet, TAKE ONE TABLET EVERY DAY WITH OR IMMEDIATELY FOLLOWING A MEAL, Disp: 90 tablet, Rfl: 2 .  Multiple Vitamins-Minerals (MULTIVITAMIN ADULTS 50+) TABS, Take 1 tablet by mouth daily., Disp: , Rfl:  .  potassium chloride SA (KLOR-CON) 20 MEQ tablet, TAKE ONE TABLET BY MOUTH EVERY DAY, Disp: 90 tablet, Rfl: 1 .  simvastatin (ZOCOR) 20 MG tablet, TAKE ONE TABLET AT BEDTIME, Disp: 90 tablet, Rfl: 0 .  triamcinolone cream (KENALOG) 0.1 %, APPLY 1 APPLICATION TOPICALLY 2 (TWO) TIMES DAILY. AS NEEDED, Disp: 453.6 g, Rfl: 0 .  valsartan-hydrochlorothiazide (DIOVAN-HCT) 320-25 MG tablet, TAKE ONE TABLET EVERY DAY, Disp: 90 tablet, Rfl: 1 .  venlafaxine XR (EFFEXOR-XR) 75 MG 24 hr capsule, TAKE 3 CAPSULES EVERY DAY, Disp: 270 capsule, Rfl: 1   Observations/Objective: Blood pressure (!) 152/100, pulse (!) 106, temperature (!) 97.4 F (36.3 C), temperature source Temporal, height 5' 7.5" (1.715 m), weight (!) 541 lb 8 oz (245.6 kg), SpO2 94 %.  Physical Exam  Diabetic foot exam: Normal inspection No skin breakdown No calluses  Normal DP pulses Normal sensation to light touch and monofilament Nails normal  Assessment and Plan    Morbid obesity with BMI of 70 and over, adult ([place]) Increase trulicity to 3 mg weekly for weight loss... continue process for bariatric surgery.  Hyperlipidemia associated with type 2 diabetes mellitus ([place]) Well controlled. Continue current  medication. On statin.  Controlled type 2 diabetes mellitus with complication, without long-term current use of insulin ([facility]) Good control on trulicity.  Hypoventilation associated with obesity ([facility]) Stable control on  Oxygen.  Hypertension associated with diabetes ([place]) Well controlled at home. Continue current medication.   Hypokalemia  Due  For re-eval after 1 week of increased dose.  Likely cause of leg cramps.[name] keep well hydrated as well.    Eliezer Lofts, MD

## 2020-02-20 NOTE — Patient Instructions (Addendum)
Set up yearly eye exam for diabetes and have the opthalmologist send [name] a copy of the evaluation for the chart. Increase trulciity to 3 mg weekly  Keep moving forward with bariatric surgery process.  Please stop at the lab to have labs drawn.

## 2020-02-20 NOTE — Assessment & Plan Note (Signed)
Good control on trulicity. 

## 2020-02-20 NOTE — Assessment & Plan Note (Signed)
Increase trulicity to 3 mg weekly for weight loss... continue process for bariatric surgery.

## 2020-02-20 NOTE — Assessment & Plan Note (Signed)
Stable control on  Oxygen.

## 2020-02-20 NOTE — Assessment & Plan Note (Signed)
Well controlled. Continue current medication. On statin. 

## 2020-02-20 NOTE — Assessment & Plan Note (Addendum)
Due  For re-eval after 1 week of increased dose.  Likely cause of leg cramps.[name] keep well hydrated as well.

## 2020-03-31 ENCOUNTER — Other Ambulatory Visit: Payer: Self-pay | Admitting: Family Medicine

## 2020-03-31 DIAGNOSIS — F3342 Major depressive disorder, recurrent, in full remission: Secondary | ICD-10-CM

## 2020-04-03 ENCOUNTER — Other Ambulatory Visit: Payer: Self-pay | Admitting: Family Medicine

## 2020-04-16 ENCOUNTER — Other Ambulatory Visit: Payer: Self-pay

## 2020-04-16 ENCOUNTER — Ambulatory Visit (INDEPENDENT_AMBULATORY_CARE_PROVIDER_SITE_OTHER): Payer: 59 | Admitting: Family Medicine

## 2020-04-16 ENCOUNTER — Encounter: Payer: Self-pay | Admitting: Family Medicine

## 2020-04-16 VITALS — BP 177/93 | HR 89 | Temp 96.9°F | Ht 67.5 in | Wt >= 6400 oz

## 2020-04-16 DIAGNOSIS — E118 Type 2 diabetes mellitus with unspecified complications: Secondary | ICD-10-CM | POA: Diagnosis not present

## 2020-04-16 DIAGNOSIS — Z23 Encounter for immunization: Secondary | ICD-10-CM

## 2020-04-16 DIAGNOSIS — Z6841 Body Mass Index (BMI) 40.0 and over, adult: Secondary | ICD-10-CM | POA: Diagnosis not present

## 2020-04-16 NOTE — Progress Notes (Signed)
Chief Complaint  Patient presents with  . Follow-up    weight management    History of Present Illness: HPI   64 year old male presents for 2 month follow up weight management.  He is on venlafaxine for MDD.  Reviewed 04/08/2020 OV with Bariatric provider at [facility]... plans bariatric surgery but needs pulm clearance ( has appt scheduled), card clearance ( has appt scheduled in 06/2020), physical therapy ( has appt scheduled),  monthly video visit with Dr. Flavia Shipper, nutrition followup, psychology clearance ( scheduled in October), needs 6 months with primary care for ginsurance.   At last OV he was increased to 3 mg weekly of Trulicity. He has stopped all soda. Drinking water instead.  He has not had any fried foods in last few weeks. He is excited and motivated with this new plan. He has noted no SE to higher dose of Trulicity. Has noted decreased appetite and able to watch potion size.  Changing to healthy snack snacks.   Was able to do home chores more easily.. motivated to try more standing and laundry etc.  Total weight loss from start of plan 02/20/20: 12 lbs Wt Readings from Last 3 Encounters:  04/16/20 (!) 529 lb 12 oz (240.3 kg)  02/20/20 (!) 541 lb 8 oz (245.6 kg)  02/11/20 (!) 537 lb (243.6 kg)   Co morbidities: DM, Sleep apnea, HTN, elevated cholesterol  This visit occurred during the SARS-CoV-2 public health emergency.  Safety protocols were in place, including screening questions prior to the visit, additional usage of staff PPE, and extensive cleaning of exam room while observing appropriate contact time as indicated for disinfecting solutions.   COVID 19 screen:  No recent travel or known exposure to COVID19 The patient denies respiratory symptoms of COVID 19 at this time. The importance of social distancing was discussed today.     Review of Systems  Constitutional: Negative for chills and fever.  HENT: Negative for congestion and ear pain.   Eyes: Negative  for pain and redness.  Respiratory: Positive for shortness of breath. Negative for cough.   Cardiovascular: Negative for chest pain, palpitations and leg swelling.  Gastrointestinal: Negative for abdominal pain, blood in stool, constipation, diarrhea, nausea and vomiting.  Genitourinary: Negative for dysuria.  Musculoskeletal: Negative for falls and myalgias.  Skin: Negative for rash.  Neurological: Negative for dizziness.  Psychiatric/Behavioral: Negative for depression. The patient is not nervous/anxious.       Past Medical History:  Diagnosis Date  . Basal cell carcinoma [id number]   [place]  . Cellulitis 02/09/2012   RLE  . Complication of anesthesia ~ 2000   "during OR for kidney stones; anesthesia RX made me code twice in one week"  . Depression   . Diastolic dysfunction    a. 11/2014 Echo: EF 50-55%, Gr 1 DD.  [name] History of blood transfusion 1958   "I was an Rh baby"  . Hyperlipidemia   . Hypertension   . Kidney stones   . Midsternal chest pain   . Morbid obesity ([place])   . OSA (obstructive sleep apnea)    "wear CPAP"  . Personal history of colonic adenoma 09/14/2012    reports that he has never smoked. He has never used smokeless tobacco. He reports that he does not drink alcohol and does not use drugs.   Current Outpatient Medications:  .  aspirin EC 81 MG tablet, Take 81 mg by mouth daily., Disp: , Rfl:  .  cyclobenzaprine (FLEXERIL) 10  MG tablet, TAKE 1 TABLET BY MOUTH AT BEDTIME AS NEEDED FOR MUSCLE SPASMS, Disp: 15 tablet, Rfl: 0 .  diclofenac (VOLTAREN) 75 MG EC tablet, Take 75 mg by mouth 2 (two) times daily as needed., Disp: , Rfl:  .  Dulaglutide (TRULICITY) 3 NI/6.2VO SOPN, Inject 0.5 mLs (3 mg total) as directed once a week., Disp: 3 mL, Rfl: 11 .  metoprolol succinate (TOPROL-XL) 50 MG 24 hr tablet, TAKE ONE TABLET EVERY DAY WITH OR IMMEDIATELY FOLLOWING A MEAL, Disp: 90 tablet, Rfl: 2 .  Multiple Vitamins-Minerals (MULTIVITAMIN ADULTS 50+) TABS,  Take 1 tablet by mouth daily., Disp: , Rfl:  .  potassium chloride SA (KLOR-CON) 20 MEQ tablet, TAKE ONE TABLET BY MOUTH EVERY DAY, Disp: 90 tablet, Rfl: 1 .  simvastatin (ZOCOR) 20 MG tablet, TAKE ONE TABLET AT BEDTIME, Disp: 90 tablet, Rfl: 3 .  triamcinolone cream (KENALOG) 0.1 %, APPLY 1 APPLICATION TOPICALLY 2 (TWO) TIMES DAILY. AS NEEDED, Disp: 453.6 g, Rfl: 0 .  valsartan-hydrochlorothiazide (DIOVAN-HCT) 320-25 MG tablet, TAKE ONE TABLET EVERY DAY, Disp: 90 tablet, Rfl: 1 .  venlafaxine XR (EFFEXOR-XR) 75 MG 24 hr capsule, TAKE 3 CAPSULES EVERY DAY, Disp: 270 capsule, Rfl: 1 .  amLODipine (NORVASC) 5 MG tablet, Take 1 tablet (5 mg total) by mouth daily., Disp: , Rfl:    Observations/Objective: Blood pressure (!) 177/93, pulse 89, temperature (!) 96.9 F (36.1 C), temperature source Temporal, height 5' 7.5" (1.715 m), weight (!) 529 lb 12 oz (240.3 kg), SpO2 94 %.  Physical Exam Constitutional:      Appearance: He is well-developed. He is obese.     Comments: In wheelchair on oxygen  HENT:     Head: Normocephalic.     Right Ear: Hearing normal.     Left Ear: Hearing normal.     Nose: Nose normal.  Neck:     Thyroid: No thyroid mass or thyromegaly.     Vascular: No carotid bruit.     Trachea: Trachea normal.  Cardiovascular:     Rate and Rhythm: Normal rate and regular rhythm.     Pulses: Normal pulses.     Heart sounds: Heart sounds not distant. No murmur heard.  No friction rub. No gallop.      Comments: B peripheral edema improved in compression hose Pulmonary:     Effort: Pulmonary effort is normal. No respiratory distress.     Breath sounds: Normal breath sounds.  Skin:    General: Skin is warm and dry.     Findings: No rash.     Comments: Chronic venous stasis changes   Psychiatric:        Speech: Speech normal.        Behavior: Behavior normal.        Thought Content: Thought content normal.   Using [employer] in office.  Assessment and Plan Morbid obesity  associated with multiple co-morbidities: Weight loss with increased diet changes, increase home activity and 3 mg weekly of Trulicity.   See summarized treatment plan in photograph notes below.       Eliezer Lofts, MD

## 2020-05-15 ENCOUNTER — Encounter: Payer: Self-pay | Admitting: Family Medicine

## 2020-05-15 ENCOUNTER — Other Ambulatory Visit: Payer: Self-pay

## 2020-05-15 ENCOUNTER — Ambulatory Visit (INDEPENDENT_AMBULATORY_CARE_PROVIDER_SITE_OTHER): Payer: 59 | Admitting: Family Medicine

## 2020-05-15 VITALS — BP 132/80 | HR 108 | Temp 97.4°F | Ht 67.5 in | Wt >= 6400 oz

## 2020-05-15 DIAGNOSIS — Z6841 Body Mass Index (BMI) 40.0 and over, adult: Secondary | ICD-10-CM

## 2020-05-15 DIAGNOSIS — L0291 Cutaneous abscess, unspecified: Secondary | ICD-10-CM

## 2020-05-15 DIAGNOSIS — E662 Morbid (severe) obesity with alveolar hypoventilation: Secondary | ICD-10-CM | POA: Diagnosis not present

## 2020-05-15 MED ORDER — CEPHALEXIN 500 MG PO CAPS: 500.0000 mg | ORAL_CAPSULE | Freq: Three times a day (TID) | ORAL | 0 refills | Status: DC

## 2020-05-15 NOTE — Patient Instructions (Signed)
Warm compresses 2-3 times daily.  Start keflex TID x 7 days.  Call if redness spreading.

## 2020-05-15 NOTE — Assessment & Plan Note (Addendum)
Working on healthy lifestyle changes, increasing activity and continue trulicity 3 mg dose.   Forms for monthly checks prior to  bariatric surgery completed.

## 2020-05-15 NOTE — Progress Notes (Signed)
Chief Complaint  Patient presents with  . 4 Week F/U Weight  . Sore On Belly    History of Present Illness: HPI   64 year old male presents for 4 week visit for weight management for super morbid obesity.   Wt loss since last OV: 0  Wt loss since start of regimen: 12  Wt Readings from Last 3 Encounters:  05/15/20 (!) 529 lb (240 kg)  04/16/20 (!) 529 lb 12 oz (240.3 kg)  02/20/20 (!) 541 lb 8 oz (245.6 kg)  Body mass index is 81.63 kg/m.    He reports he has been under more stress lately.  He weights at home at 520 lb.  He has started PT.. going 2 days every other week.. doing home   exercises with theraband 2 times daily on no PT days.  Stretching out arms.  Doing his own laundry.  Standing more during cooking.. this is new for him. Trying to make larger trip when going to bathroom. Trying to do more daily chores.  Setting timer to get up and walker every hour to walk.   Increasing water, no soft drinks, no fried foods. eating more chicken.. steamed or baked.    He feels PT has helped with stress management.[name] doing deep breathing.  he is trying to read more and watch TV less.  No SE Trulictiy 3 mg weekly x 2 months.  Has sore area on abdomen x 1 week.. felt like ingrown hair.  No personal history of MRSA  COVID 19 screen:  No recent travel or known exposure to [place] The patient denies respiratory symptoms of COVID 19 at this time. The importance of social distancing was discussed today.   This visit occurred during the SARS-CoV-2 public health emergency.  Safety protocols were in place, including screening questions prior to the visit, additional usage of staff PPE, and extensive cleaning of exam room while observing appropriate contact time as indicated for disinfecting solutions.   Review of Systems  Constitutional: Negative for chills and fever.  HENT: Negative for congestion and ear pain.   Eyes: Negative for pain and redness.  Respiratory: Negative  for cough and shortness of breath.   Cardiovascular: Negative for chest pain, palpitations and leg swelling.  Gastrointestinal: Negative for abdominal pain, blood in stool, constipation, diarrhea, nausea and vomiting.  Genitourinary: Negative for dysuria.  Musculoskeletal: Negative for falls and myalgias.  Skin: Negative for rash.  Neurological: Negative for dizziness.  Psychiatric/Behavioral: Negative for depression. The patient is not nervous/anxious.       Past Medical History:  Diagnosis Date  . Basal cell carcinoma [id number]   [place]  . Cellulitis 02/09/2012   RLE  . Complication of anesthesia ~ 2000   "during OR for kidney stones; anesthesia RX made me code twice in one week"  . Depression   . Diastolic dysfunction    a. 11/2014 Echo: EF 50-55%, Gr 1 DD.  [name] History of blood transfusion 1958   "I was an Rh baby"  . Hyperlipidemia   . Hypertension   . Kidney stones   . Midsternal chest pain   . Morbid obesity ([place])   . OSA (obstructive sleep apnea)    "wear CPAP"  . Personal history of colonic adenoma 09/14/2012    reports that he has never smoked. He has never used smokeless tobacco. He reports that he does not drink alcohol and does not use drugs.   Current Outpatient Medications:  .  aspirin EC 81  MG tablet, Take 81 mg by mouth daily., Disp: , Rfl:  .  cyclobenzaprine (FLEXERIL) 10 MG tablet, TAKE 1 TABLET BY MOUTH AT BEDTIME AS NEEDED FOR MUSCLE SPASMS, Disp: 15 tablet, Rfl: 0 .  diclofenac (VOLTAREN) 75 MG EC tablet, Take 75 mg by mouth 2 (two) times daily as needed., Disp: , Rfl:  .  Dulaglutide (TRULICITY) 3 HY/0.7PX SOPN, Inject 0.5 mLs (3 mg total) as directed once a week., Disp: 3 mL, Rfl: 11 .  metoprolol succinate (TOPROL-XL) 50 MG 24 hr tablet, TAKE ONE TABLET EVERY DAY WITH OR IMMEDIATELY FOLLOWING A MEAL, Disp: 90 tablet, Rfl: 2 .  Multiple Vitamins-Minerals (MULTIVITAMIN ADULTS 50+) TABS, Take 1 tablet by mouth daily., Disp: , Rfl:  .  potassium  chloride SA (KLOR-CON) 20 MEQ tablet, TAKE ONE TABLET BY MOUTH EVERY DAY, Disp: 90 tablet, Rfl: 1 .  simvastatin (ZOCOR) 20 MG tablet, TAKE ONE TABLET AT BEDTIME, Disp: 90 tablet, Rfl: 3 .  triamcinolone cream (KENALOG) 0.1 %, APPLY 1 APPLICATION TOPICALLY 2 (TWO) TIMES DAILY. AS NEEDED, Disp: 453.6 g, Rfl: 0 .  valsartan-hydrochlorothiazide (DIOVAN-HCT) 320-25 MG tablet, TAKE ONE TABLET EVERY DAY, Disp: 90 tablet, Rfl: 1 .  venlafaxine XR (EFFEXOR-XR) 75 MG 24 hr capsule, TAKE 3 CAPSULES EVERY DAY, Disp: 270 capsule, Rfl: 1 .  amLODipine (NORVASC) 5 MG tablet, Take 1 tablet (5 mg total) by mouth daily., Disp: , Rfl:    Observations/Objective: Blood pressure 132/80, pulse (!) 108, temperature (!) 97.4 F (36.3 C), height 5' 7.5" (1.715 m), weight (!) 529 lb (240 kg), SpO2 95 %.  Physical Exam Constitutional:      Appearance: He is well-developed. He is obese.     Comments: In wheelchair on oxygen  HENT:     Head: Normocephalic.     Right Ear: Hearing normal.     Left Ear: Hearing normal.     Nose: Nose normal.  Neck:     Thyroid: No thyroid mass or thyromegaly.     Vascular: No carotid bruit.     Trachea: Trachea normal.  Cardiovascular:     Rate and Rhythm: Normal rate and regular rhythm.     Pulses: Normal pulses.     Heart sounds: Heart sounds not distant. No murmur heard.  No friction rub. No gallop.      Comments: B peripheral edema improved in compression hose Pulmonary:     Effort: Pulmonary effort is normal. No respiratory distress.     Breath sounds: Normal breath sounds.  Skin:    General: Skin is warm and dry.     Findings: No rash.     Comments: Chronic venous stasis changes   Left lower abdomen.[name] open ucer with erythema surrounding, red irritated hair follicles.   Psychiatric:        Speech: Speech normal.        Behavior: Behavior normal.        Thought Content: Thought content normal.        Assessment and Plan    Morbid obesity with BMI of 70 and  over, adult [facility]) Working on healthy lifestyle changes, increasing activity and continue trulicity 3 mg dose.   Forms for monthly checks prior to  bariatric surgery completed.  Hypoventilation associated with obesity ([place]) Working on chest wall stretching and strengthening with theraband.  Abscess Open and draining. Wound culture obtained. Treat with Kelfex and warm compresses. Follow up if not improving.    Eliezer Lofts, MD

## 2020-05-15 NOTE — Assessment & Plan Note (Signed)
Open and draining. Wound culture obtained. Treat with Kelfex and warm compresses. Follow up if not improving.

## 2020-05-15 NOTE — Assessment & Plan Note (Signed)
Working on chest wall stretching and strengthening with theraband.

## 2020-05-16 ENCOUNTER — Telehealth: Payer: Self-pay

## 2020-05-16 MED ORDER — CEPHALEXIN 500 MG PO CAPS
500.0000 mg | ORAL_CAPSULE | Freq: Three times a day (TID) | ORAL | 0 refills | Status: DC
Start: 2020-05-16 — End: 2020-06-12

## 2020-05-18 LAB — WOUND CULTURE
GRAM STAIN:: NONE SEEN
MICRO NUMBER:: 11077637
SPECIMEN QUALITY:: ADEQUATE

## 2020-05-18 NOTE — Telephone Encounter (Signed)
Patient Name: Blake Garcia Gender: Male DOB: [birth year] Age: 64 Y 26 M 9 D Return Phone Number: [phone] (Primary) Address: City/State/Zip: [place] [address] Client [facility] [place] Night - Client Client Site [place] Physician Eliezer Lofts - MD Contact Type Call Who Is Calling Patient / Member / Family / Caregiver Call Type Triage / Clinical Relationship To Patient Self Return Phone Number [phone] (Primary) Chief Complaint Prescription Refill or Medication Request (non symptomatic) Reason for Call Symptomatic / Request for [name] states he is needing his medication called in. -K flex Translation No Nurse Assessment Nurse: Self, RN, Nira Conn Date/Time (Eastern Time): 05/16/2020 8:55:48 AM Confirm and document reason for call. If symptomatic, describe symptoms. ---Caller says he just received a notification the med is at the pharmacy. No longer needs services. Does the patient have any new or worsening symptoms? ---No Disp. Time Eilene Ghazi Time) Disposition Final User 05/16/2020 8:56:58 AM Clinical Call Yes Self, RN, Caleen Essex

## 2020-06-03 ENCOUNTER — Encounter: Payer: Self-pay | Admitting: Internal Medicine

## 2020-06-12 ENCOUNTER — Encounter: Payer: Self-pay | Admitting: Family Medicine

## 2020-06-12 ENCOUNTER — Other Ambulatory Visit: Payer: Self-pay

## 2020-06-12 ENCOUNTER — Ambulatory Visit (INDEPENDENT_AMBULATORY_CARE_PROVIDER_SITE_OTHER): Payer: 59 | Admitting: Family Medicine

## 2020-06-12 DIAGNOSIS — L0291 Cutaneous abscess, unspecified: Secondary | ICD-10-CM | POA: Diagnosis not present

## 2020-06-12 DIAGNOSIS — E876 Hypokalemia: Secondary | ICD-10-CM

## 2020-06-12 DIAGNOSIS — E118 Type 2 diabetes mellitus with unspecified complications: Secondary | ICD-10-CM | POA: Diagnosis not present

## 2020-06-12 DIAGNOSIS — Z6841 Body Mass Index (BMI) 40.0 and over, adult: Secondary | ICD-10-CM | POA: Diagnosis not present

## 2020-06-12 NOTE — Patient Instructions (Addendum)
Increase potassium to 1.5 caplets daily. We will plan labs at next appt. to check potassium.  keep up the great work!

## 2020-06-12 NOTE — Assessment & Plan Note (Signed)
Continued weight loss on Trrulicity. No change in meds but reviewed current changes he is making with diet and exercise.  See scanned in summary sheet.

## 2020-06-12 NOTE — Assessment & Plan Note (Signed)
Resolved S/P antibiotics.

## 2020-06-12 NOTE — Assessment & Plan Note (Signed)
Improved control on recent testing at bariatric center.

## 2020-06-12 NOTE — Assessment & Plan Note (Signed)
Encouraged exercise, weight loss, healthy eating habits. ? ?

## 2020-06-12 NOTE — Assessment & Plan Note (Signed)
Due to HCTZ.  Increase potassium to 1.5 caplets daily. We will plan labs at next appt. to check potassium.

## 2020-06-12 NOTE — Progress Notes (Signed)
Chief Complaint  Patient presents with  . 4 Week Follow Up    History of Present Illness: HPI  64 year old male with super morbid obesity with multiple comorbidities presents for continued weight loss management  Wt loss since last OV: 5 Wt loss since start of regimen: 17  Wt Readings from Last 3 Encounters:  06/12/20 (!) 524 lb (237.7 kg)  05/15/20 (!) 529 lb (240 kg)  04/16/20 (!) 529 lb 12 oz (240.3 kg)    He has been continuing physical therapy.  He has met with nutritionist, psychologist.  He has had folliculitis on left lower abdomen.. s/p treatment with cephalexin x 7 days.  Area is still itchy, no pain, no discharge.  Applying topical antibiotic. Wound culture was negative.    Diovan HCT causing low potasssium  Recently low potassium despite   K 3.1  A1C 6... down from 6.2  This visit occurred during the SARS-CoV-2 public health emergency.  Safety protocols were in place, including screening questions prior to the visit, additional usage of staff PPE, and extensive cleaning of exam room while observing appropriate contact time as indicated for disinfecting solutions.   COVID 19 screen:  No recent travel or known exposure to COVID19 The patient denies respiratory symptoms of COVID 19 at this time. The importance of social distancing was discussed today.     Review of Systems  Constitutional: Negative for chills and fever.  HENT: Negative for congestion and ear pain.   Eyes: Negative for pain and redness.  Respiratory: Negative for cough and shortness of breath.   Cardiovascular: Negative for chest pain, palpitations and leg swelling.  Gastrointestinal: Negative for abdominal pain, blood in stool, constipation, diarrhea, nausea and vomiting.  Genitourinary: Negative for dysuria.  Musculoskeletal: Negative for falls and myalgias.  Skin: Negative for rash.  Neurological: Negative for dizziness.  Psychiatric/Behavioral: Negative for depression. The patient is  not nervous/anxious.       Past Medical History:  Diagnosis Date  . Basal cell carcinoma [id number]   [place]  . Cellulitis 02/09/2012   RLE  . Complication of anesthesia ~ 2000   "during OR for kidney stones; anesthesia RX made me code twice in one week"  . Depression   . Diastolic dysfunction    a. 11/2014 Echo: EF 50-55%, Gr 1 DD.  [name] History of blood transfusion 1958   "I was an Rh baby"  . Hyperlipidemia   . Hypertension   . Kidney stones   . Midsternal chest pain   . Morbid obesity ([place])   . OSA (obstructive sleep apnea)    "wear CPAP"  . Personal history of colonic adenoma 09/14/2012    reports that he has never smoked. He has never used smokeless tobacco. He reports that he does not drink alcohol and does not use drugs.   Current Outpatient Medications:  .  aspirin EC 81 MG tablet, Take 81 mg by mouth daily., Disp: , Rfl:  .  cyclobenzaprine (FLEXERIL) 10 MG tablet, TAKE 1 TABLET BY MOUTH AT BEDTIME AS NEEDED FOR MUSCLE SPASMS, Disp: 15 tablet, Rfl: 0 .  diclofenac (VOLTAREN) 75 MG EC tablet, Take 75 mg by mouth 2 (two) times daily as needed., Disp: , Rfl:  .  Dulaglutide (TRULICITY) 3 WO/0.3OZ SOPN, Inject 0.5 mLs (3 mg total) as directed once a week., Disp: 3 mL, Rfl: 11 .  metoprolol succinate (TOPROL-XL) 50 MG 24 hr tablet, TAKE ONE TABLET EVERY DAY WITH OR IMMEDIATELY FOLLOWING A MEAL,  Disp: 90 tablet, Rfl: 2 .  Multiple Vitamins-Minerals (MULTIVITAMIN ADULTS 50+) TABS, Take 1 tablet by mouth daily., Disp: , Rfl:  .  potassium chloride SA (KLOR-CON) 20 MEQ tablet, TAKE ONE TABLET BY MOUTH EVERY DAY, Disp: 90 tablet, Rfl: 1 .  simvastatin (ZOCOR) 20 MG tablet, TAKE ONE TABLET AT BEDTIME, Disp: 90 tablet, Rfl: 3 .  triamcinolone cream (KENALOG) 0.1 %, APPLY 1 APPLICATION TOPICALLY 2 (TWO) TIMES DAILY. AS NEEDED, Disp: 453.6 g, Rfl: 0 .  valsartan-hydrochlorothiazide (DIOVAN-HCT) 320-25 MG tablet, TAKE ONE TABLET EVERY DAY, Disp: 90 tablet, Rfl: 1 .  venlafaxine  XR (EFFEXOR-XR) 75 MG 24 hr capsule, TAKE 3 CAPSULES EVERY DAY, Disp: 270 capsule, Rfl: 1 .  amLODipine (NORVASC) 5 MG tablet, Take 1 tablet (5 mg total) by mouth daily., Disp: , Rfl:    Observations/Objective: Blood pressure 138/84, pulse 85, temperature (!) 97.1 F (36.2 C), temperature source Temporal, weight (!) 524 lb (237.7 kg), SpO2 95 %.  Physical Exam Constitutional:      Appearance: He is well-developed. He is obese.  HENT:     Head: Normocephalic.     Right Ear: Hearing normal.     Left Ear: Hearing normal.     Nose: Nose normal.  Neck:     Thyroid: No thyroid mass or thyromegaly.     Vascular: No carotid bruit.     Trachea: Trachea normal.  Cardiovascular:     Rate and Rhythm: Normal rate and regular rhythm.     Pulses: Normal pulses.     Heart sounds: Heart sounds not distant. No murmur heard.  No friction rub. No gallop.      Comments: No peripheral edema Pulmonary:     Effort: Pulmonary effort is normal. No respiratory distress.     Breath sounds: Normal breath sounds.  Skin:    General: Skin is warm and dry.     Findings: No rash.          Comments: Healing scab with no surrounding erythema on left lower abdomen  large pannus  Psychiatric:        Speech: Speech normal.        Behavior: Behavior normal.        Thought Content: Thought content normal.      Assessment and Plan  Morbid obesity with BMI of 70 and over, adult ([place]) Continued weight loss on Trrulicity. No change in meds but reviewed current changes he is making with diet and exercise.  See scanned in summary sheet.  Abscess Resolved S/P antibiotics.  BMI 70 and over, adult [facility]) Encouraged exercise, weight loss, healthy eating habits.   Controlled type 2 diabetes mellitus with complication, without long-term current use of insulin ([place]) Improved control on recent testing at bariatric center.  Hypokalemia Due to HCTZ.  Increase potassium to 1.5 caplets daily. We will plan labs at  next appt. to check potassium.     Eliezer Lofts, MD

## 2020-06-16 ENCOUNTER — Ambulatory Visit (INDEPENDENT_AMBULATORY_CARE_PROVIDER_SITE_OTHER): Payer: 59 | Admitting: Cardiovascular Disease

## 2020-06-16 ENCOUNTER — Other Ambulatory Visit: Payer: Self-pay

## 2020-06-16 ENCOUNTER — Encounter: Payer: Self-pay | Admitting: Cardiovascular Disease

## 2020-06-16 VITALS — BP 138/80 | HR 90 | Ht 67.0 in | Wt >= 6400 oz

## 2020-06-16 DIAGNOSIS — I10 Essential (primary) hypertension: Secondary | ICD-10-CM

## 2020-06-16 DIAGNOSIS — E1159 Type 2 diabetes mellitus with other circulatory complications: Secondary | ICD-10-CM | POA: Diagnosis not present

## 2020-06-16 DIAGNOSIS — I152 Hypertension secondary to endocrine disorders: Secondary | ICD-10-CM | POA: Diagnosis not present

## 2020-06-16 DIAGNOSIS — I493 Ventricular premature depolarization: Secondary | ICD-10-CM | POA: Diagnosis not present

## 2020-06-16 DIAGNOSIS — Z01810 Encounter for preprocedural cardiovascular examination: Secondary | ICD-10-CM | POA: Diagnosis not present

## 2020-06-16 NOTE — Progress Notes (Signed)
Cardiology Office Note   Date:  06/16/2020   ID:  Blake Garcia, DOB October 28, 1955, MRN [id number]  PCP:  Jinny Sanders, MD  Cardiologist:   Kathlyn Sacramento, MD   Chief Complaint  Patient presents with  . OTHER    3-4 month f/u no complaints today. Meds reviewed verbally with pt.      History of Present Illness: Blake Garcia is a 64 y.o. male who presents for a follow-up visit regarding PVCs and chronic diastolic heart failure.  He is morbidly obese with weight greater than 500 pounds.  Previous echocardiogram 2016 and 2019 both showed normal LV systolic function.  He was seen in February of this year with exertional chest pain and fatigue.  He had palpitations.  ZIO monitor showed an average heart rate of 99 bpm with 3 episodes of wide-complex tachycardia longest lasted 15 beats and 4 runs of SVT.  Occasional PVCs with a burden of 2.6%.  Echocardiogram in February showed normal LV systolic function with severe left ventricular hypertrophy. He was seen by EP and was started on a beta-blocker.  He is tolerating Toprol 50 mg once daily and reports significant improvement in symptoms.  He denies chest pain at the present time.  He has minimal palpitations.  He is in the process of bariatric weight loss surgery evaluation.  He started some physical therapy and lost about 20 pounds.  He feels much better than before.  He is excited about having weight loss surgery done given that he struggled with obesity all his life.  His son is getting married next year.   Past Medical History:  Diagnosis Date  . Basal cell carcinoma [id number]   [place]  . Cellulitis 02/09/2012   RLE  . Complication of anesthesia ~ 2000   "during OR for kidney stones; anesthesia RX made me code twice in one week"  . Depression   . Diastolic dysfunction    a. 11/2014 Echo: EF 50-55%, Gr 1 DD.  [name] History of blood transfusion 1958   "I was an Rh baby"  . Hyperlipidemia   . Hypertension   . Kidney  stones   . Midsternal chest pain   . Morbid obesity ([place])   . OSA (obstructive sleep apnea)    "wear CPAP"  . Personal history of colonic adenoma 09/14/2012    Past Surgical History:  Procedure Laterality Date  . CHOLECYSTECTOMY  1993  . ESOPHAGOGASTRODUODENOSCOPY    . KNEE ARTHROSCOPY  ~ 2004   right  . LITHOTRIPSY  2000     Current Outpatient Medications  Medication Sig Dispense Refill  . amLODipine (NORVASC) 5 MG tablet Take 1 tablet (5 mg total) by mouth daily.    [name] aspirin EC 81 MG tablet Take 81 mg by mouth daily.    . cyclobenzaprine (FLEXERIL) 10 MG tablet TAKE 1 TABLET BY MOUTH AT BEDTIME AS NEEDED FOR MUSCLE SPASMS 15 tablet 0  . diclofenac (VOLTAREN) 75 MG EC tablet Take 75 mg by mouth 2 (two) times daily as needed.    . Dulaglutide (TRULICITY) 3 XN/1.7GY SOPN Inject 0.5 mLs (3 mg total) as directed once a week. 3 mL 11  . metoprolol succinate (TOPROL-XL) 50 MG 24 hr tablet TAKE ONE TABLET EVERY DAY WITH OR IMMEDIATELY FOLLOWING A MEAL 90 tablet 2  . Multiple Vitamins-Minerals (MULTIVITAMIN ADULTS 50+) TABS Take 1 tablet by mouth daily.    . potassium chloride SA (KLOR-CON) 20 MEQ tablet TAKE ONE TABLET  BY MOUTH EVERY DAY (Patient taking differently: Taking 1 1/2 tablet daily.) 90 tablet 1  . simvastatin (ZOCOR) 20 MG tablet TAKE ONE TABLET AT BEDTIME 90 tablet 3  . triamcinolone cream (KENALOG) 0.1 % APPLY 1 APPLICATION TOPICALLY 2 (TWO) TIMES DAILY. AS NEEDED 453.6 g 0  . valsartan-hydrochlorothiazide (DIOVAN-HCT) 320-25 MG tablet TAKE ONE TABLET EVERY DAY 90 tablet 1  . venlafaxine XR (EFFEXOR-XR) 75 MG 24 hr capsule TAKE 3 CAPSULES EVERY DAY 270 capsule 1   No current facility-administered medications for this visit.    Allergies:   Patient has no known allergies.    Social History:  The patient  reports that he has never smoked. He has never used smokeless tobacco. He reports that he does not drink alcohol and does not use drugs.   Family History:  The  patient's family history includes ADD / ADHD in his daughter, son, and son; Cancer in his maternal aunt and maternal uncle; Coronary artery disease in his brother and brother; Diabetes in his brother; Heart disease in his brother, brother, father, and mother; Hypertension in his mother; Stroke in his father.    ROS:  Please see the history of present illness.   Otherwise, review of systems are positive for none.   All other systems are reviewed and negative.    PHYSICAL EXAM: VS:  BP 138/80 (BP Location: Left Wrist, Patient Position: Sitting, Cuff Size: Large)   Pulse 90   Ht 5\' 7"  (1.702 m)   Wt (!) 524 lb 4 oz (237.8 kg)   SpO2 96%   BMI 82.11 kg/m  , BMI Body mass index is 82.11 kg/m. GEN: Well nourished, well developed, in no acute distress  HEENT: normal  Neck: no JVD, carotid bruits, or masses Cardiac: RRR; no murmurs, rubs, or gallops,no edema  Respiratory:  clear to auscultation bilaterally, normal work of breathing GI: soft, nontender, nondistended, + BS MS: no deformity or atrophy  Skin: warm and dry, no rash Neuro:  Strength and sensation are intact Psych: euthymic mood, full affect   EKG:  EKG is ordered today. The ekg ordered today demonstrates normal sinus rhythm with left axis deviation and nonspecific IVCD.   Recent Labs: 09/27/2019: Hemoglobin 15.5; Platelets 232; TSH 2.457 02/13/2020: ALT 15; BUN 17; Creatinine, Ser 0.98; Sodium 140 02/20/2020: Magnesium 2.0; Potassium 3.5    Lipid Panel    Component Value Date/Time   CHOL 151 02/13/2020 0933   TRIG 83.0 02/13/2020 0933   TRIG 130 08/10/2006 1022   HDL 61.60 02/13/2020 0933   CHOLHDL 2 02/13/2020 0933   VLDL 16.6 02/13/2020 0933   LDLCALC 73 02/13/2020 0933   LDLDIRECT 153.9 09/10/2007 1029      Wt Readings from Last 3 Encounters:  06/16/20 (!) 524 lb 4 oz (237.8 kg)  06/12/20 (!) 524 lb (237.7 kg)  05/15/20 (!) 529 lb (240 kg)       No flowsheet data found.    ASSESSMENT AND PLAN:  1.   PVCs and short runs of SVT: Significant improvement with metoprolol which should be continued at the current dose.  2.  Chest pain and exertional dyspnea: He reports resolution of chest pain and improvement in shortness of breath with beta-blocker therapy as well as being more active with physical therapy.  3.  Essential hypertension: Blood pressure is controlled on current medications  4.  Hyperlipidemia: Currently on simvastatin with most recent LDL of 73.  5.  Obstructive sleep apnea on CPAP  6.  Preop cardiovascular evaluation for bariatric surgery: The patient's EKG does not show ischemic changes and multiple echocardiogram showed normal LV systolic function most recently this year in April.  Ischemic cardiac evaluation is limited by his morbid obesity and I do not necessarily feel that this has to be done given the lack of anginal symptoms.  I think he can proceed at an overall moderate risk from a cardiac standpoint.    Disposition:   FU with me in 6 months  Signed,  Kathlyn Sacramento, MD  06/16/2020 3:25 PM    [facility]

## 2020-06-16 NOTE — Patient Instructions (Signed)

## 2020-06-23 ENCOUNTER — Telehealth: Payer: Self-pay

## 2020-06-23 NOTE — Telephone Encounter (Signed)
Hi Dr Carlean Purl,  I am prepping charts for 07/03/20 pre visit, this pt was scheduled for [facility] on 12/15. The  BMI is 82, so it will need to be canceled, however, before I call him I was wondering, if you want to see him in the office prior to scheduling any procedure?  Thank you, Etheleen Nicks, RN

## 2020-06-23 NOTE — Telephone Encounter (Signed)
Please arrange for an office visit re: hx colon polyps and morbid obesity - increased risk of sedation

## 2020-06-24 ENCOUNTER — Telehealth: Payer: Self-pay | Admitting: Internal Medicine

## 2020-06-24 NOTE — Telephone Encounter (Signed)
LM on pts VM for him to call back to reschedule appointment.

## 2020-06-29 ENCOUNTER — Ambulatory Visit: Payer: 59 | Admitting: Internal Medicine

## 2020-06-29 ENCOUNTER — Encounter: Payer: Self-pay | Admitting: Internal Medicine

## 2020-06-29 VITALS — BP 150/90 | HR 100

## 2020-06-29 DIAGNOSIS — Z6841 Body Mass Index (BMI) 40.0 and over, adult: Secondary | ICD-10-CM | POA: Diagnosis not present

## 2020-06-29 DIAGNOSIS — Z86010 Personal history of colonic polyps: Secondary | ICD-10-CM | POA: Diagnosis not present

## 2020-06-29 MED ORDER — PEG-KCL-NACL-NASULF-NA ASC-C 100 G PO SOLR: 1.0000 | Freq: Once | ORAL | 0 refills | Status: AC

## 2020-06-29 NOTE — Telephone Encounter (Signed)
Patient seen in the office today.

## 2020-06-29 NOTE — Patient Instructions (Signed)
You have been scheduled for a colonoscopy. Please follow written instructions given to you at your visit today.  Please pick up your prep supplies at the pharmacy within the next 1-3 days. If you use inhalers (even only as needed), please bring them with you on the day of your procedure.   You may need anesthesia consult, the hospital will contact you if this is needed.   I appreciate the opportunity to care for you. Silvano Rusk, MD, [facility]

## 2020-06-29 NOTE — H&P (View-Only) (Signed)
Blake Garcia 64 y.o. [birth year] [id number]  Assessment & Plan:   Encounter Diagnoses  Name Primary?  . History of colonic polyps Yes  . BMI 70 and over, adult [facility])     Schedule surveillance colonoscopy at [facility] on July 28, 2020. The risks and benefits as well as alternatives of endoscopic procedure(s) have been discussed and reviewed. All questions answered. The patient agrees to proceed.  We did discuss how his high BMI/morbid obesity could increase potential risks of the procedure and why we schedule patient such as him at the hospital for a greater safety net. Additionally his obesity could make the colonoscopy technically challenging and unable to complete.  I apologized for our lack of direct communication regarding his appointment we left him a voicemail, the Thanksgiving holiday and office closure interfered with her ability to directly communicate.  I appreciate the opportunity to care for this patient. CC: Jinny Sanders, MD    Subjective:   Chief Complaint: History of colon polyps, schedule colonoscopy  HPI 64 year old white man with obesity planning for bariatric surgery, BMI is 82, history of colon polyps in 2009 no follow-up colonoscopy yet. He wants to schedule surveillance colonoscopy prior to having bariatric surgery at [facility] which she is hoping to do early in 2022. He does not have any active GI symptoms. He uses CPAP for his obstructive sleep apnea. He was initially scheduled to have procedure at our endoscopy center but his BMI is too high and I opted to have an office visit with the patient to review things prior to scheduling at the hospital.   2009 polyp pathology - 4 polyps all diminutive COLON, TRANSVERSE, DESCENDING AND SIGMOID, POLYPS:  - ONE ADENOMATOUS POLYP AND FRAGMENTS OF HYPERPLASTIC POLYP.  - NO HIGH GRADE DYSPLASIA OR INVASIVE MALIGNANCY IDENTIFIED  Patient was frustrated that we had changed his appointment and only  left a voicemail and had not responded to his email or MyChart message but I explained that the office was closed over Thanksgiving.  No Known Allergies Current Meds  Medication Sig  . amLODipine (NORVASC) 5 MG tablet Take 1 tablet (5 mg total) by mouth daily.  [name] aspirin EC 81 MG tablet Take 81 mg by mouth daily.  . cyclobenzaprine (FLEXERIL) 10 MG tablet TAKE 1 TABLET BY MOUTH AT BEDTIME AS NEEDED FOR MUSCLE SPASMS  . diclofenac (VOLTAREN) 75 MG EC tablet Take 75 mg by mouth 2 (two) times daily as needed.  . Dulaglutide (TRULICITY) 3 DZ/3.2DJ SOPN Inject 0.5 mLs (3 mg total) as directed once a week.  . metoprolol succinate (TOPROL-XL) 50 MG 24 hr tablet TAKE ONE TABLET EVERY DAY WITH OR IMMEDIATELY FOLLOWING A MEAL  . Multiple Vitamins-Minerals (MULTIVITAMIN ADULTS 50+) TABS Take 1 tablet by mouth daily.  . OXYGEN Inhale 2 L/min into the lungs continuous.  . potassium chloride SA (KLOR-CON) 20 MEQ tablet TAKE ONE TABLET BY MOUTH EVERY DAY (Patient taking differently: Taking 1 1/2 tablet daily.)  . simvastatin (ZOCOR) 20 MG tablet TAKE ONE TABLET AT BEDTIME  . triamcinolone cream (KENALOG) 0.1 % APPLY 1 APPLICATION TOPICALLY 2 (TWO) TIMES DAILY. AS NEEDED  . valsartan-hydrochlorothiazide (DIOVAN-HCT) 320-25 MG tablet TAKE ONE TABLET EVERY DAY  . venlafaxine XR (EFFEXOR-XR) 75 MG 24 hr capsule TAKE 3 CAPSULES EVERY DAY   Past Medical History:  Diagnosis Date  . Basal cell carcinoma [id number]   [place]  . Cellulitis 02/09/2012   RLE  . Complication of anesthesia ~ 2000   "  during OR for kidney stones; anesthesia RX made me code twice in one week"  . Depression   . Diastolic dysfunction    a. 11/2014 Echo: EF 50-55%, Gr 1 DD.  [name] History of blood transfusion 1958   "I was an Rh baby"  . Hyperlipidemia   . Hypertension   . Kidney stones   . Midsternal chest pain   . Morbid obesity ([place])   . OSA (obstructive sleep apnea)    "wear CPAP"  . Personal history of colonic adenoma  09/14/2012   Past Surgical History:  Procedure Laterality Date  . CHOLECYSTECTOMY  1993  . COLONOSCOPY    . ESOPHAGOGASTRODUODENOSCOPY    . KNEE ARTHROSCOPY  ~ 2004   right  . LITHOTRIPSY  2000   Social History   Social History Narrative   Uses a scooter for ambulation   He is disabled   He is divorced and has 3 children   No alcohol never smoker no drug use no current tobacco   family history includes ADD / ADHD in his daughter, son, and son; Cancer in his maternal aunt and maternal uncle; Coronary artery disease in his brother and brother; Diabetes in his brother; Heart disease in his brother, brother, father, and mother; Hypertension in his mother; Stroke in his father.   Review of Systems As per HPI, ambulates in a scooter has CPAP at night.  Folliculitis on lower abdomen recently.  Treated with antibiotics.  Objective:   Physical Exam BP (!) 150/90 (BP Location: Left Wrist, Patient Position: Sitting, Cuff Size: Normal)   Pulse 100  Morbidly obese middle-aged white man in no acute distress in a scooter Lungs are clear Heart sounds are normal The abdomen is morbidly obese without evidence for hernia visible He is alert and oriented x3

## 2020-06-29 NOTE — Progress Notes (Signed)
DONA KLEMANN 64 y.o. [birth year] [id number]  Assessment & Plan:   Encounter Diagnoses  Name Primary?  . History of colonic polyps Yes  . BMI 70 and over, adult [facility])     Schedule surveillance colonoscopy at [facility] on July 28, 2020. The risks and benefits as well as alternatives of endoscopic procedure(s) have been discussed and reviewed. All questions answered. The patient agrees to proceed.  We did discuss how his high BMI/morbid obesity could increase potential risks of the procedure and why we schedule patient such as him at the hospital for a greater safety net. Additionally his obesity could make the colonoscopy technically challenging and unable to complete.  I apologized for our lack of direct communication regarding his appointment we left him a voicemail, the Thanksgiving holiday and office closure interfered with her ability to directly communicate.  I appreciate the opportunity to care for this patient. CC: Jinny Sanders, MD    Subjective:   Chief Complaint: History of colon polyps, schedule colonoscopy  HPI 64 year old white man with obesity planning for bariatric surgery, BMI is 82, history of colon polyps in 2009 no follow-up colonoscopy yet. He wants to schedule surveillance colonoscopy prior to having bariatric surgery at [facility] which she is hoping to do early in 2022. He does not have any active GI symptoms. He uses CPAP for his obstructive sleep apnea. He was initially scheduled to have procedure at our endoscopy center but his BMI is too high and I opted to have an office visit with the patient to review things prior to scheduling at the hospital.   2009 polyp pathology - 4 polyps all diminutive COLON, TRANSVERSE, DESCENDING AND SIGMOID, POLYPS:  - ONE ADENOMATOUS POLYP AND FRAGMENTS OF HYPERPLASTIC POLYP.  - NO HIGH GRADE DYSPLASIA OR INVASIVE MALIGNANCY IDENTIFIED  Patient was frustrated that we had changed his appointment and only  left a voicemail and had not responded to his email or MyChart message but I explained that the office was closed over Thanksgiving.  No Known Allergies Current Meds  Medication Sig  . amLODipine (NORVASC) 5 MG tablet Take 1 tablet (5 mg total) by mouth daily.  [name] aspirin EC 81 MG tablet Take 81 mg by mouth daily.  . cyclobenzaprine (FLEXERIL) 10 MG tablet TAKE 1 TABLET BY MOUTH AT BEDTIME AS NEEDED FOR MUSCLE SPASMS  . diclofenac (VOLTAREN) 75 MG EC tablet Take 75 mg by mouth 2 (two) times daily as needed.  . Dulaglutide (TRULICITY) 3 TD/3.2KG SOPN Inject 0.5 mLs (3 mg total) as directed once a week.  . metoprolol succinate (TOPROL-XL) 50 MG 24 hr tablet TAKE ONE TABLET EVERY DAY WITH OR IMMEDIATELY FOLLOWING A MEAL  . Multiple Vitamins-Minerals (MULTIVITAMIN ADULTS 50+) TABS Take 1 tablet by mouth daily.  . OXYGEN Inhale 2 L/min into the lungs continuous.  . potassium chloride SA (KLOR-CON) 20 MEQ tablet TAKE ONE TABLET BY MOUTH EVERY DAY (Patient taking differently: Taking 1 1/2 tablet daily.)  . simvastatin (ZOCOR) 20 MG tablet TAKE ONE TABLET AT BEDTIME  . triamcinolone cream (KENALOG) 0.1 % APPLY 1 APPLICATION TOPICALLY 2 (TWO) TIMES DAILY. AS NEEDED  . valsartan-hydrochlorothiazide (DIOVAN-HCT) 320-25 MG tablet TAKE ONE TABLET EVERY DAY  . venlafaxine XR (EFFEXOR-XR) 75 MG 24 hr capsule TAKE 3 CAPSULES EVERY DAY   Past Medical History:  Diagnosis Date  . Basal cell carcinoma [id number]   [place]  . Cellulitis 02/09/2012   RLE  . Complication of anesthesia ~ 2000   "  during OR for kidney stones; anesthesia RX made me code twice in one week"  . Depression   . Diastolic dysfunction    a. 11/2014 Echo: EF 50-55%, Gr 1 DD.  [name] History of blood transfusion 1958   "I was an Rh baby"  . Hyperlipidemia   . Hypertension   . Kidney stones   . Midsternal chest pain   . Morbid obesity ([place])   . OSA (obstructive sleep apnea)    "wear CPAP"  . Personal history of colonic adenoma  09/14/2012   Past Surgical History:  Procedure Laterality Date  . CHOLECYSTECTOMY  1993  . COLONOSCOPY    . ESOPHAGOGASTRODUODENOSCOPY    . KNEE ARTHROSCOPY  ~ 2004   right  . LITHOTRIPSY  2000   Social History   Social History Narrative   Uses a scooter for ambulation   He is disabled   He is divorced and has 3 children   No alcohol never smoker no drug use no current tobacco   family history includes ADD / ADHD in his daughter, son, and son; Cancer in his maternal aunt and maternal uncle; Coronary artery disease in his brother and brother; Diabetes in his brother; Heart disease in his brother, brother, father, and mother; Hypertension in his mother; Stroke in his father.   Review of Systems As per HPI, ambulates in a scooter has CPAP at night.  Folliculitis on lower abdomen recently.  Treated with antibiotics.  Objective:   Physical Exam BP (!) 150/90 (BP Location: Left Wrist, Patient Position: Sitting, Cuff Size: Normal)   Pulse 100  Morbidly obese middle-aged white man in no acute distress in a scooter Lungs are clear Heart sounds are normal The abdomen is morbidly obese without evidence for hernia visible He is alert and oriented x3

## 2020-07-06 ENCOUNTER — Other Ambulatory Visit: Payer: Self-pay | Admitting: Family Medicine

## 2020-07-06 ENCOUNTER — Other Ambulatory Visit: Payer: Self-pay | Admitting: Internal Medicine

## 2020-07-06 DIAGNOSIS — F3342 Major depressive disorder, recurrent, in full remission: Secondary | ICD-10-CM

## 2020-07-10 ENCOUNTER — Ambulatory Visit (INDEPENDENT_AMBULATORY_CARE_PROVIDER_SITE_OTHER): Payer: 59 | Admitting: Family Medicine

## 2020-07-10 ENCOUNTER — Encounter: Payer: Self-pay | Admitting: Family Medicine

## 2020-07-10 ENCOUNTER — Other Ambulatory Visit: Payer: Self-pay

## 2020-07-10 VITALS — BP 144/82 | HR 88 | Temp 98.3°F | Ht 67.0 in | Wt >= 6400 oz

## 2020-07-10 DIAGNOSIS — E876 Hypokalemia: Secondary | ICD-10-CM

## 2020-07-10 DIAGNOSIS — I5032 Chronic diastolic (congestive) heart failure: Secondary | ICD-10-CM

## 2020-07-10 DIAGNOSIS — Z6841 Body Mass Index (BMI) 40.0 and over, adult: Secondary | ICD-10-CM

## 2020-07-10 DIAGNOSIS — R0789 Other chest pain: Secondary | ICD-10-CM | POA: Insufficient documentation

## 2020-07-10 DIAGNOSIS — E1159 Type 2 diabetes mellitus with other circulatory complications: Secondary | ICD-10-CM

## 2020-07-10 DIAGNOSIS — I152 Hypertension secondary to endocrine disorders: Secondary | ICD-10-CM

## 2020-07-10 LAB — BASIC METABOLIC PANEL
BUN: 19 mg/dL (ref 6–23)
CO2: 34 mEq/L — ABNORMAL HIGH (ref 19–32)
Calcium: 9.5 mg/dL (ref 8.4–10.5)
Chloride: 97 mEq/L (ref 96–112)
Creatinine, Ser: 0.83 mg/dL (ref 0.40–1.50)
GFR: 92.98 mL/min (ref >60.00)
Glucose, Bld: 106 mg/dL — ABNORMAL HIGH (ref 70–99)
Potassium: 3.5 mEq/L (ref 3.5–5.1)
Sodium: 140 mEq/L (ref 135–145)

## 2020-07-10 NOTE — Assessment & Plan Note (Signed)
Chronic, Due to HCTZ. HAs been on 30 mEq for 1 months.. recheck level today.

## 2020-07-10 NOTE — Patient Instructions (Signed)
Please stop at the lab to have labs drawn.  keep working on lifestyle changes.  Start upper body exercising.  Avoid skipping meals.  Increase duration of peddling.

## 2020-07-10 NOTE — Assessment & Plan Note (Signed)
New, mild Most likely chest wall muscle strain vs less like shingles without rash. Start ice and gentle stretching, call if rash appears.  Can use tylenol as needed for pain.

## 2020-07-10 NOTE — Assessment & Plan Note (Signed)
Stable, chronic.  Continue current medication.  valsartan-hydrochlorothiazide (DIOVAN-HCT) 320-25 MG tablet TAKE ONE TABLET EVERY DAY   metoprolol succinate (TOPROL-XL) 50 MG 24 hr tablet    amLODipine (NORVASC) 5 MG tablet Take 1 tablet (5 mg total) by mouth daily.

## 2020-07-10 NOTE — Progress Notes (Signed)
Chief Complaint  Patient presents with  . Follow-up  . Chest Pain    History of Present Illness: HPI  64 year old male with super morbid obesity with multiple comorbidities presents for continued weight loss management  Continued on Trulicity 3 mg weekly  Wt loss since last OV: 1 Wt loss since start of regimen: 18  Wt Readings from Last 3 Encounters:  07/10/20 (!) 523 lb (237.2 kg)  06/16/20 (!) 524 lb 4 oz (237.8 kg)  06/12/20 (!) 524 lb (237.7 kg)   He has seen cardiology and has colonoscopy set up.  New, chest pain: right upper chest wall, feels like a bruise, pain with deep breaths  ongoing x 1-2 weeks, warm.  Tender to touch, no change with exertion  no known injury or fall. COVID booster 1.5 weeks ago on left arm.  Diovan HCT causing low potasssium  Recently low potassium despite 20 KCL in 06/2020  K 3.1 Now on higher K  X 1 month. KCL 30 MEq.[name] due for recheck.   Reviewed OV note with Dr. Fletcher Anon 11/16 Followed up on PV and chronic diastolic heart failure.. continued metoprolol.      This visit occurred during the SARS-CoV-2 public health emergency.  Safety protocols were in place, including screening questions prior to the visit, additional usage of staff PPE, and extensive cleaning of exam room while observing appropriate contact time as indicated for disinfecting solutions.   COVID 19 screen:  No recent travel or known exposure to COVID19 The patient denies respiratory symptoms of COVID 19 at this time. The importance of social distancing was discussed today.    Review of Systems  Constitutional: Negative for chills and fever.  HENT: Negative for congestion and ear pain.   Eyes: Negative for pain and redness.  Respiratory: Negative for cough and shortness of breath.   Cardiovascular: Negative for chest pain, palpitations and leg swelling.  Gastrointestinal: Negative for abdominal pain, blood in stool, constipation, diarrhea, nausea and vomiting.   Genitourinary: Negative for dysuria.  Musculoskeletal: Negative for falls and myalgias.  Skin: Negative for rash.  Neurological: Negative for dizziness.  Psychiatric/Behavioral: Negative for depression. The patient is not nervous/anxious.       Past Medical History:  Diagnosis Date  . Basal cell carcinoma [id number]   [place]  . Cellulitis 02/09/2012   RLE  . Complication of anesthesia ~ 2000   "during OR for kidney stones; anesthesia RX made me code twice in one week"  . Depression   . Diastolic dysfunction    a. 11/2014 Echo: EF 50-55%, Gr 1 DD.  [name] History of blood transfusion 1958   "I was an Rh baby"  . Hyperlipidemia   . Hypertension   . Kidney stones   . Midsternal chest pain   . Morbid obesity ([place])   . OSA (obstructive sleep apnea)    "wear CPAP"  . Personal history of colonic adenoma 09/14/2012    reports that he has never smoked. He has never used smokeless tobacco. He reports that he does not drink alcohol and does not use drugs.   Current Outpatient Medications on File Prior to Visit  Medication Sig Dispense Refill  . aspirin EC 81 MG tablet Take 81 mg by mouth daily.    . cyclobenzaprine (FLEXERIL) 10 MG tablet TAKE 1 TABLET BY MOUTH AT BEDTIME AS NEEDED FOR MUSCLE SPASMS 15 tablet 0  . diclofenac (VOLTAREN) 75 MG EC tablet Take 75 mg by mouth 2 (two) times  daily as needed.    . Dulaglutide (TRULICITY) 3 CV/8.9FY SOPN Inject 0.5 mLs (3 mg total) as directed once a week. 3 mL 11  . metoprolol succinate (TOPROL-XL) 50 MG 24 hr tablet TAKE ONE TABLET EVERY DAY WITH OR IMMEDIATELY FOLLOWING A MEAL 90 tablet 0  . Multiple Vitamins-Minerals (MULTIVITAMIN ADULTS 50+) TABS Take 1 tablet by mouth daily.    . OXYGEN Inhale 2 L/min into the lungs continuous.    . potassium chloride SA (KLOR-CON) 20 MEQ tablet TAKE ONE TABLET BY MOUTH EVERY DAY (Patient taking differently: Taking 1 1/2 tablet daily.) 90 tablet 1  . simvastatin (ZOCOR) 20 MG tablet TAKE ONE TABLET  AT BEDTIME 90 tablet 3  . triamcinolone cream (KENALOG) 0.1 % APPLY 1 APPLICATION TOPICALLY 2 (TWO) TIMES DAILY. AS NEEDED 453.6 g 0  . valsartan-hydrochlorothiazide (DIOVAN-HCT) 320-25 MG tablet TAKE ONE TABLET EVERY DAY 90 tablet 1  . venlafaxine XR (EFFEXOR-XR) 75 MG 24 hr capsule TAKE 3 CAPSULES EVERY DAY 270 capsule 1  . amLODipine (NORVASC) 5 MG tablet Take 1 tablet (5 mg total) by mouth daily.     No current facility-administered medications on file prior to visit.   Observations/Objective: Blood pressure (!) 144/82, pulse 88, temperature 98.3 F (36.8 C), temperature source Oral, height 5\' 7"  (1.702 m), weight (!) 523 lb (237.2 kg), SpO2 98 %.  Physical Exam Constitutional:      General: Vital signs are normal.     Appearance: He is well-developed and well-nourished. He is obese.  HENT:     Head: Normocephalic.     Right Ear: Hearing normal.     Left Ear: Hearing normal.     Nose: Nose normal.     Mouth/Throat:     Mouth: Oropharynx is clear and moist and mucous membranes are normal.  Neck:     Thyroid: No thyroid mass or thyromegaly.     Vascular: No carotid bruit.     Trachea: Trachea normal.  Cardiovascular:     Rate and Rhythm: Normal rate and regular rhythm.     Pulses: Normal pulses.     Heart sounds: Heart sounds not distant. No murmur heard. No friction rub. No gallop.      Comments: No peripheral edema Pulmonary:     Effort: Pulmonary effort is normal. No respiratory distress.     Breath sounds: Normal breath sounds.  Chest:     Chest wall: Tenderness present. No mass, lacerations, deformity, swelling, crepitus or edema. There is no dullness to percussion.  Breasts: Breasts are symmetrical.     Right: Normal. No axillary adenopathy or supraclavicular adenopathy.     Left: Normal.     Lymphadenopathy:     Upper Body:     Right upper body: No supraclavicular, axillary or pectoral adenopathy.  Skin:    General: Skin is warm, dry and intact.     Findings:  No rash.  Psychiatric:        Mood and Affect: Mood and affect normal.        Speech: Speech normal.        Behavior: Behavior normal.        Thought Content: Thought content normal.      Assessment and Plan   Morbid obesity with BMI of 70 and over, adult ([place]) Paperwork completed for monthly weight management visit. Counseled on dietary, exercise and psychological components needed for weight loss.  Changes planned:  Avoid skipping meals.  Increase duration of peddling.  Low blood potassium Chronic, Due to HCTZ. HAs been on 30 mEq for 1 months.. recheck level today.  Right-sided chest wall pain New, mild Most likely chest wall muscle strain vs less like shingles without rash. Start ice and gentle stretching, call if rash appears.  Can use tylenol as needed for pain.  Hypertension associated with diabetes ([facility]) Stable, chronic.  Continue current medication.  valsartan-hydrochlorothiazide (DIOVAN-HCT) 320-25 MG tablet TAKE ONE TABLET EVERY DAY   metoprolol succinate (TOPROL-XL) 50 MG 24 hr tablet    amLODipine (NORVASC) 5 MG tablet Take 1 tablet (5 mg total) by mouth daily.    Diastolic heart failure ([place]) .Chronic, reviewed recent Cardiology note. No changes in regimen made.  CVD clearance for upcoming bariatric surgery given by cardiologist.    Eliezer Lofts, MD

## 2020-07-10 NOTE — Assessment & Plan Note (Signed)
.  Chronic, reviewed recent Cardiology note. No changes in regimen made.  CVD clearance for upcoming bariatric surgery given by cardiologist.

## 2020-07-10 NOTE — Assessment & Plan Note (Signed)
Paperwork completed for monthly weight management visit. Counseled on dietary, exercise and psychological components needed for weight loss.  Changes planned:  Avoid skipping meals.  Increase duration of peddling.

## 2020-07-14 MED ORDER — POTASSIUM CHLORIDE CRYS ER 20 MEQ PO TBCR
EXTENDED_RELEASE_TABLET | ORAL | 1 refills | Status: DC
Start: 2020-07-14 — End: 2021-01-01

## 2020-07-15 ENCOUNTER — Encounter: Payer: 59 | Admitting: Internal Medicine

## 2020-07-20 ENCOUNTER — Other Ambulatory Visit: Payer: Self-pay

## 2020-07-20 ENCOUNTER — Encounter (HOSPITAL_COMMUNITY): Payer: Self-pay | Admitting: Internal Medicine

## 2020-07-21 ENCOUNTER — Encounter (HOSPITAL_COMMUNITY): Payer: Self-pay | Admitting: Internal Medicine

## 2020-07-23 ENCOUNTER — Other Ambulatory Visit (HOSPITAL_COMMUNITY): Payer: 59

## 2020-07-27 ENCOUNTER — Other Ambulatory Visit (HOSPITAL_COMMUNITY)
Admission: RE | Admit: 2020-07-27 | Discharge: 2020-07-27 | Disposition: A | Discharge status: Home / Self Care | Payer: 59 | Source: Ambulatory Visit | Attending: Internal Medicine | Admitting: Internal Medicine

## 2020-07-27 DIAGNOSIS — Z01812 Encounter for preprocedural laboratory examination: Secondary | ICD-10-CM | POA: Diagnosis present

## 2020-07-27 DIAGNOSIS — Z20822 Contact with and (suspected) exposure to covid-19: Secondary | ICD-10-CM | POA: Insufficient documentation

## 2020-07-27 LAB — SARS CORONAVIRUS 2 (TAT 6-24 HRS): SARS Coronavirus 2: NEGATIVE

## 2020-07-27 NOTE — Anesthesia Preprocedure Evaluation (Addendum)
Anesthesia Evaluation  Patient identified by MRN, date of birth, ID band Patient awake    Reviewed: Allergy & Precautions, NPO status , Patient's Chart, lab work & pertinent test results, reviewed documented beta blocker date and time   History of Anesthesia Complications (+) history of anesthetic complications  Airway Mallampati: III  TM Distance: >3 FB Neck ROM: Full    Dental  (+) Teeth Intact, Dental Advisory Given,    Pulmonary sleep apnea (2L [place] 24/7), Continuous Positive Airway Pressure Ventilation and Oxygen sleep apnea ,    Pulmonary exam normal breath sounds clear to auscultation       Cardiovascular hypertension, Pt. on home beta blockers and Pt. on medications +CHF (diastolic dysfunction)  Normal cardiovascular exam Rhythm:Regular Rate:Normal  Echo 10/2019: 1. Left ventricular ejection fraction, by estimation, is >55%. The left  ventricle has hyperdynamic function. Left ventricular endocardial border  not optimally defined to evaluate regional wall motion. There is severe  left ventricular hypertrophy. Left  ventricular diastolic function could not be evaluated.  2. Right ventricular systolic function was not well visualized. The right  ventricular size is not well visualized.  3. The mitral valve was not well visualized. No evidence of mitral valve  regurgitation.  4. The aortic valve was not well visualized. Aortic valve regurgitation  not well assessed.  5. Pulmonic valve regurgitation not well assessed.    Neuro/Psych PSYCHIATRIC DISORDERS Depression negative neurological ROS     GI/Hepatic Neg liver ROS,   Endo/Other  diabetes, Well Controlled, Type 2, Oral Hypoglycemic AgentsMorbid obesityBMI 33 a1c 6.2  Renal/GU negative Renal ROS  negative genitourinary   Musculoskeletal  (+) Arthritis , Osteoarthritis,    Abdominal (+) + obese,   Peds  Hematology negative hematology ROS (+)    Anesthesia Other Findings   Reproductive/Obstetrics negative OB ROS                          Anesthesia Physical Anesthesia Plan  ASA: IV  Anesthesia Plan: MAC   Post-op Pain Management:    Induction:   PONV Risk Score and Plan: 2 and Propofol infusion and TIVA  Airway Management Planned: Natural Airway, Simple Face Mask and LMA  Additional Equipment: None  Intra-op Plan:   Post-operative Plan:   Informed Consent: I have reviewed the patients History and Physical, chart, labs and discussed the procedure including the risks, benefits and alternatives for the proposed anesthesia with the patient or authorized representative who has indicated his/her understanding and acceptance.     Dental advisory given  Plan Discussed with: CRNA  Anesthesia Plan Comments: (Low threshold for placing LMA, very likely to obstruct easily with sedation)       Anesthesia Quick Evaluation

## 2020-07-28 ENCOUNTER — Ambulatory Visit (HOSPITAL_COMMUNITY)
Admission: RE | Admit: 2020-07-28 | Discharge: 2020-07-28 | Disposition: A | Discharge status: Home / Self Care | Payer: 59 | Attending: Internal Medicine | Admitting: Internal Medicine

## 2020-07-28 ENCOUNTER — Encounter (HOSPITAL_COMMUNITY)
Admission: RE | Disposition: A | Discharge status: Home / Self Care | Payer: Self-pay | Source: Home / Self Care | Attending: Internal Medicine

## 2020-07-28 ENCOUNTER — Other Ambulatory Visit: Payer: Self-pay

## 2020-07-28 ENCOUNTER — Ambulatory Visit (HOSPITAL_COMMUNITY): Payer: 59 | Admitting: Anesthesiology

## 2020-07-28 ENCOUNTER — Encounter (HOSPITAL_COMMUNITY): Payer: Self-pay | Admitting: Internal Medicine

## 2020-07-28 DIAGNOSIS — G4733 Obstructive sleep apnea (adult) (pediatric): Secondary | ICD-10-CM | POA: Insufficient documentation

## 2020-07-28 DIAGNOSIS — K5730 Diverticulosis of large intestine without perforation or abscess without bleeding: Secondary | ICD-10-CM | POA: Insufficient documentation

## 2020-07-28 DIAGNOSIS — Z86010 Personal history of colonic polyps: Secondary | ICD-10-CM | POA: Insufficient documentation

## 2020-07-28 DIAGNOSIS — Z1211 Encounter for screening for malignant neoplasm of colon: Secondary | ICD-10-CM | POA: Diagnosis present

## 2020-07-28 DIAGNOSIS — Z7982 Long term (current) use of aspirin: Secondary | ICD-10-CM | POA: Insufficient documentation

## 2020-07-28 DIAGNOSIS — Z6841 Body Mass Index (BMI) 40.0 and over, adult: Secondary | ICD-10-CM | POA: Diagnosis not present

## 2020-07-28 DIAGNOSIS — D123 Benign neoplasm of transverse colon: Secondary | ICD-10-CM | POA: Insufficient documentation

## 2020-07-28 DIAGNOSIS — Z79899 Other long term (current) drug therapy: Secondary | ICD-10-CM | POA: Diagnosis not present

## 2020-07-28 DIAGNOSIS — Z7984 Long term (current) use of oral hypoglycemic drugs: Secondary | ICD-10-CM | POA: Insufficient documentation

## 2020-07-28 HISTORY — PX: COLONOSCOPY WITH PROPOFOL: SHX5780

## 2020-07-28 HISTORY — DX: Personal history of urinary calculi: Z87.442

## 2020-07-28 HISTORY — DX: Prediabetes: R73.03

## 2020-07-28 HISTORY — DX: Dependence on supplemental oxygen: Z99.81

## 2020-07-28 HISTORY — PX: POLYPECTOMY: SHX5525

## 2020-07-28 SURGERY — COLONOSCOPY WITH PROPOFOL
Anesthesia: Monitor Anesthesia Care

## 2020-07-28 MED ORDER — KETAMINE HCL 10 MG/ML IJ SOLN
INTRAMUSCULAR | Status: AC
  Filled 2020-07-28: qty 1

## 2020-07-28 MED ORDER — MIDAZOLAM HCL 2 MG/2ML IJ SOLN
INTRAMUSCULAR | Status: DC | PRN
  Administered 2020-07-28: 2 mg via INTRAVENOUS

## 2020-07-28 MED ORDER — PROPOFOL 500 MG/50ML IV EMUL
INTRAVENOUS | Status: AC
  Filled 2020-07-28: qty 50

## 2020-07-28 MED ORDER — PROPOFOL 500 MG/50ML IV EMUL
INTRAVENOUS | Status: DC | PRN
  Administered 2020-07-28: 85 ug/kg/min via INTRAVENOUS

## 2020-07-28 MED ORDER — MIDAZOLAM HCL 2 MG/2ML IJ SOLN
INTRAMUSCULAR | Status: AC
  Filled 2020-07-28: qty 2

## 2020-07-28 MED ORDER — KETAMINE HCL 10 MG/ML IJ SOLN
INTRAMUSCULAR | Status: DC | PRN
  Administered 2020-07-28: 10 mg via INTRAVENOUS

## 2020-07-28 MED ORDER — PROPOFOL 500 MG/50ML IV EMUL: INTRAVENOUS | Status: DC | PRN

## 2020-07-28 MED ORDER — DEXMEDETOMIDINE (PRECEDEX) IN NS 20 MCG/5ML (4 MCG/ML) IV SYRINGE
PREFILLED_SYRINGE | INTRAVENOUS | Status: DC | PRN
  Administered 2020-07-28: 8 ug via INTRAVENOUS
  Administered 2020-07-28: 12 ug via INTRAVENOUS

## 2020-07-28 MED ORDER — LACTATED RINGERS IV SOLN: INTRAVENOUS | Status: DC

## 2020-07-28 MED ORDER — SODIUM CHLORIDE 0.9 % IV SOLN: INTRAVENOUS | Status: DC

## 2020-07-28 SURGICAL SUPPLY — 22 items

## 2020-07-28 NOTE — Transfer of Care (Signed)
Immediate Anesthesia Transfer of Care Note  Patient: Blake Garcia  Procedure(s) Performed: COLONOSCOPY WITH PROPOFOL (N/A )  Patient Location: PACU  Anesthesia Type:MAC  Level of Consciousness: awake, alert  and oriented  Airway & Oxygen Therapy: Patient Spontanous Breathing and Patient connected to face mask oxygen  Post-op Assessment: Report given to RN, Post -op Vital signs reviewed and stable and Patient moving all extremities X 4  Post vital signs: Reviewed and stable  Last Vitals:  Vitals Value Taken Time  BP    Temp    Pulse    Resp    SpO2      Last Pain:  Vitals:   07/28/20 0732  TempSrc: Oral  PainSc: 0-No pain         Complications: No complications documented.

## 2020-07-28 NOTE — Anesthesia Procedure Notes (Signed)
Procedure Name: MAC Date/Time: 07/28/2020 8:30 AM Performed by: Niel Hummer, CRNA Pre-anesthesia Checklist: Patient identified, Emergency Drugs available, Suction available and Patient being monitored Oxygen Delivery Method: Simple face mask

## 2020-07-28 NOTE — Anesthesia Postprocedure Evaluation (Signed)
Anesthesia Post Note  Patient: Blake Garcia  Procedure(s) Performed: COLONOSCOPY WITH PROPOFOL (N/A ) POLYPECTOMY     Patient location during evaluation: PACU Anesthesia Type: MAC Level of consciousness: awake and alert Pain management: pain level controlled Vital Signs Assessment: post-procedure vital signs reviewed and stable Respiratory status: spontaneous breathing, nonlabored ventilation and respiratory function stable Cardiovascular status: blood pressure returned to baseline and stable Postop Assessment: no apparent nausea or vomiting Anesthetic complications: no   No complications documented.  Last Vitals:  Vitals:   07/28/20 0920 07/28/20 0930  BP: (!) 158/69 (!) 168/66  Pulse: 86 86  Resp: 13 (!) 22  Temp:    SpO2: 100% 100%    Last Pain:  Vitals:   07/28/20 0930  TempSrc:   PainSc: 0-No pain                 Pervis Hocking

## 2020-07-28 NOTE — Discharge Instructions (Signed)
I found and removed one tiny polyp that looks benign.  You also have a condition called diverticulosis - common and not usually a problem. Please read the handout provided.  I will let you know pathology results and when to have another routine colonoscopy by mail and/or My Chart.  I appreciate the opportunity to care for you. Iva Boop, MD, FACG   YOU HAD AN ENDOSCOPIC PROCEDURE TODAY: Refer to the procedure report and other information in the discharge instructions given to you for any specific questions about what was found during the examination. If this information does not answer your questions, please call Dr. Marvell Fuller office at [phone] to clarify.   YOU SHOULD EXPECT: Some feelings of bloating in the abdomen. Passage of more gas than usual. Walking can help get rid of the air that was put into your GI tract during the procedure and reduce the bloating. If you had a lower endoscopy (such as a colonoscopy or flexible sigmoidoscopy) you may notice spotting of blood in your stool or on the toilet paper. Some abdominal soreness may be present for a day or two, also.  DIET: Your first meal following the procedure should be a light meal and then it is ok to progress to your normal diet. A half-sandwich or bowl of soup is an example of a good first meal. Heavy or fried foods are harder to digest and may make you feel nauseous or bloated. Drink plenty of fluids but you should avoid alcoholic beverages for 24 hours.   ACTIVITY: Your care partner should take you home directly after the procedure. You should plan to take it easy, moving slowly for the rest of the day. You can resume normal activity the day after the procedure however YOU SHOULD NOT DRIVE, use power tools, machinery or perform tasks that involve climbing or major physical exertion for 24 hours (because of the sedation medicines used during the test).   SYMPTOMS TO REPORT IMMEDIATELY: A gastroenterologist can be reached at  any hour. Please call [phone]  for any of the following symptoms:  Following lower endoscopy (colonoscopy, flexible sigmoidoscopy) Excessive amounts of blood in the stool  Significant tenderness, worsening of abdominal pains  Swelling of the abdomen that is new, acute  Fever of 100 or higher

## 2020-07-28 NOTE — Op Note (Signed)
[facility] Patient Name: Blake Garcia Procedure Date: 07/28/2020 MRN: [id number] Attending MD: Gatha Mayer , MD Date of Birth: [birth year] CSN: [id number] Age: 64 Admit Type: Outpatient Procedure:                Colonoscopy Indications:              Surveillance: Personal history of adenomatous                            polyps on last colonoscopy > 5 years ago, Last                            colonoscopy: 2009 Providers:                Gatha Mayer, MD, Benay Pillow, RN, Nelia Shi, RN, Tyna Jaksch Technician Referring MD:              Medicines:                Propofol per Anesthesia, Monitored Anesthesia Care Complications:            No immediate complications. Estimated Blood Loss:     Estimated blood loss was minimal. Procedure:                Pre-Anesthesia Assessment:                           - Prior to the procedure, a History and Physical                            was performed, and patient medications and                            allergies were reviewed. The patient's tolerance of                            previous anesthesia was also reviewed. The risks                            and benefits of the procedure and the sedation                            options and risks were discussed with the patient.                            All questions were answered, and informed consent                            was obtained. Prior Anticoagulants: The patient has                            taken no previous anticoagulant or antiplatelet  agents. ASA Grade Assessment: IV - A patient with                            severe systemic disease that is a constant threat                            to life. After reviewing the risks and benefits,                            the patient was deemed in satisfactory condition to                            undergo the procedure.                            After obtaining informed consent, the colonoscope                            was passed under direct vision. Throughout the                            procedure, the patient's blood pressure, pulse, and                            oxygen saturations were monitored continuously. The                            [id number]) Olympus colonoscope was                            introduced through the anus and advanced to the the                            cecum, identified by appendiceal orifice and                            ileocecal valve. The colonoscopy was performed                            without difficulty. The patient tolerated the                            procedure well. The quality of the bowel                            preparation was excellent. The ileocecal valve,                            appendiceal orifice, and rectum were photographed.                            The bowel preparation used was MoviPrep via split  dose instruction. Scope In: 8:39:12 AM Scope Out: 8:59:40 AM Scope Withdrawal Time: 0 hours 17 minutes 29 seconds  Total Procedure Duration: 0 hours 20 minutes 28 seconds  Findings:      The perianal and digital rectal examinations were normal. Pertinent       negatives include normal prostate (size, shape, and consistency).      A diminutive polyp was found in the transverse colon. The polyp was       sessile. The polyp was removed with a cold snare. Resection and       retrieval were complete. Verification of patient identification for the       specimen was done. Estimated blood loss was minimal.      Multiple diverticula were found in the sigmoid colon.      The exam was otherwise without abnormality on direct and retroflexion       views. Impression:               - One diminutive polyp in the transverse colon,                            removed with a cold snare. Resected and retrieved.                           -  Diverticulosis in the sigmoid colon.                           - The examination was otherwise normal on direct                            and retroflexion views.                           - Personal history of colonic polyps. 2009 1                            diminutive adenoma + 3 diminutive hyperplastic                            polyps Moderate Sedation:      Not Applicable - Patient had care per Anesthesia. Recommendation:           - Patient has a contact number available for                            emergencies. The signs and symptoms of potential                            delayed complications were discussed with the                            patient. Return to normal activities tomorrow.                            Written discharge instructions were provided to the  patient.                           - Resume previous diet.                           - Continue present medications.                           - Repeat colonoscopy is recommended. The                            colonoscopy date will be determined after pathology                            results from today's exam become available for                            review. Procedure Code(s):        --- Professional ---                           [id number], Colonoscopy, flexible; with removal of                            tumor(s), polyp(s), or other lesion(s) by snare                            technique Diagnosis Code(s):        --- Professional ---                           Z86.010, Personal history of colonic polyps                           K63.5, Polyp of colon                           K57.30, Diverticulosis of large intestine without                            perforation or abscess without bleeding CPT copyright 2019 American Medical Association. All rights reserved. The codes documented in this report are preliminary and upon coder review may  be revised to meet current compliance  requirements. Iva Boop, MD 07/28/2020 9:14:10 AM This report has been signed electronically. Number of Addenda: 0

## 2020-07-28 NOTE — Interval H&P Note (Signed)
History and Physical Interval Note:  07/28/2020 8:25 AM  Blake Garcia  has presented today for surgery, with the diagnosis of Hx of polyps.  The various methods of treatment have been discussed with the patient and family. After consideration of risks, benefits and other options for treatment, the patient has consented to  Procedure(s): COLONOSCOPY WITH PROPOFOL (N/A) as a surgical intervention.  The patient's history has been reviewed, patient examined, no change in status, stable for surgery.  I have reviewed the patient's chart and labs.  Questions were answered to the patient's satisfaction.     Stan Head

## 2020-07-29 LAB — SURGICAL PATHOLOGY

## 2020-07-30 ENCOUNTER — Encounter: Payer: Self-pay | Admitting: Internal Medicine

## 2020-07-31 ENCOUNTER — Encounter (HOSPITAL_COMMUNITY): Payer: Self-pay | Admitting: Internal Medicine

## 2020-08-07 ENCOUNTER — Ambulatory Visit: Payer: 59 | Admitting: Family Medicine

## 2020-08-14 ENCOUNTER — Other Ambulatory Visit: Payer: Self-pay

## 2020-08-14 ENCOUNTER — Ambulatory Visit: Payer: 59 | Admitting: Family Medicine

## 2020-08-14 DIAGNOSIS — Z6841 Body Mass Index (BMI) 40.0 and over, adult: Secondary | ICD-10-CM

## 2020-08-14 NOTE — Assessment & Plan Note (Signed)
He continues to be engaged and working hard of diet changes and increasing activity despite his limitations.  20 lb weight loss since 04/2020.  Cleared for surgery by cardiology and following one more weight managment OV with me.

## 2020-08-14 NOTE — Progress Notes (Signed)
Patient ID: Blake Garcia, male    DOB: 07/29/1956, 65 y.o.   MRN: [id number]  This visit was conducted in person.  BP (!) 160/90   Pulse 92   Temp (!) 97.4 F (36.3 C) (Temporal)   Ht 5\' 7"  (1.702 m)   Wt (!) 521 lb 8 oz (236.6 kg)   SpO2 96%   BMI 81.68 kg/m    CC: weight managment Subjective:   HPI: Blake Garcia is a 65 y.o. male presenting on 08/14/2020 for weight management   Visit #5.[name]. one more. Continued on Trulicity 3 mg weekly  Wt loss since last OV:2 Wt loss since start of regimen:20  Wt Readings from Last 3 Encounters:  08/14/20 (!) 521 lb 8 oz (236.6 kg)  07/28/20 (!) 522 lb (236.8 kg)  07/10/20 (!) 523 lb (237.2 kg)   Since last OV he has had a colonoscopy.   Has been able to increase foot cycling to  20 min. Keeping diet log.  No chest pressure, stable SOB on continuous oxygen.         Relevant past medical, surgical, family and social history reviewed and updated as indicated. Interim medical history since our last visit reviewed. Allergies and medications reviewed and updated. Outpatient Medications Prior to Visit  Medication Sig Dispense Refill  . amLODipine (NORVASC) 5 MG tablet Take 5 mg by mouth daily.    [name]. aspirin EC 81 MG tablet Take 81 mg by mouth daily.    . cyclobenzaprine (FLEXERIL) 10 MG tablet TAKE 1 TABLET BY MOUTH AT BEDTIME AS NEEDED FOR MUSCLE SPASMS (Patient taking differently: Take 10 mg by mouth at bedtime as needed for muscle spasms.) 15 tablet 0  . diclofenac (VOLTAREN) 75 MG EC tablet Take 75 mg by mouth 2 (two) times daily as needed for moderate pain.    . Dulaglutide (TRULICITY) 3 MG/0.5ML SOPN Inject 0.5 mLs (3 mg total) as directed once a week. 3 mL 11  . metoprolol succinate (TOPROL-XL) 50 MG 24 hr tablet TAKE ONE TABLET EVERY DAY WITH OR IMMEDIATELY FOLLOWING A MEAL (Patient taking differently: Take 50 mg by mouth daily.) 90 tablet 0  . Multiple Vitamins-Minerals (MULTIVITAMIN ADULTS 50+) TABS Take 1 tablet  by mouth daily.    . OXYGEN Inhale 2 L/min into the lungs continuous.    . potassium chloride SA (KLOR-CON) 20 MEQ tablet Taking 1 1/2 tablet daily. (Patient taking differently: Take 30 mEq by mouth daily. Taking 1 1/2 tablet daily.) 135 tablet 1  . simvastatin (ZOCOR) 20 MG tablet TAKE ONE TABLET AT BEDTIME (Patient taking differently: Take 20 mg by mouth at bedtime.) 90 tablet 3  . triamcinolone cream (KENALOG) 0.1 % APPLY 1 APPLICATION TOPICALLY 2 (TWO) TIMES DAILY. AS NEEDED (Patient taking differently: Apply 1 application topically 2 (two) times daily as needed (irritation). APPLY 1 APPLICATION TOPICALLY 2 (TWO) TIMES DAILY AS NEEDED) 453.6 g 0  . valsartan-hydrochlorothiazide (DIOVAN-HCT) 320-25 MG tablet TAKE ONE TABLET EVERY DAY (Patient taking differently: Take 1 tablet by mouth daily.) 90 tablet 1  . venlafaxine XR (EFFEXOR-XR) 75 MG 24 hr capsule TAKE 3 CAPSULES EVERY DAY (Patient taking differently: Take 225 mg by mouth daily with breakfast. TAKE 3 CAPSULES EVERY DAY) 270 capsule 1   No facility-administered medications prior to visit.     Per HPI unless specifically indicated in ROS section below Review of Systems  Constitutional: Negative for fatigue and fever.  HENT: Negative for ear pain.   Eyes: Negative  for pain.  Respiratory: Negative for cough and shortness of breath.   Cardiovascular: Negative for chest pain, palpitations and leg swelling.  Gastrointestinal: Negative for abdominal pain.  Genitourinary: Negative for dysuria.  Musculoskeletal: Negative for arthralgias.  Neurological: Negative for syncope, light-headedness and headaches.  Psychiatric/Behavioral: Negative for dysphoric mood.   Objective:  BP (!) 160/90   Pulse 92   Temp (!) 97.4 F (36.3 C) (Temporal)   Ht 5\' 7"  (1.702 m)   Wt (!) 521 lb 8 oz (236.6 kg)   SpO2 96%   BMI 81.68 kg/m   Wt Readings from Last 3 Encounters:  08/14/20 (!) 521 lb 8 oz (236.6 kg)  07/28/20 (!) 522 lb (236.8 kg)  07/10/20  (!) 523 lb (237.2 kg)      Physical Exam Constitutional:      General: Vital signs are normal.     Appearance: He is well-developed and well-nourished. He is obese.  HENT:     Head: Normocephalic.     Right Ear: Hearing normal.     Left Ear: Hearing normal.     Nose: Nose normal.     Mouth/Throat:     Mouth: Oropharynx is clear and moist and mucous membranes are normal.  Neck:     Thyroid: No thyroid mass or thyromegaly.     Vascular: No carotid bruit.     Trachea: Trachea normal.  Cardiovascular:     Rate and Rhythm: Normal rate and regular rhythm.     Pulses: Normal pulses.     Heart sounds: Heart sounds not distant. No murmur heard. No friction rub. No gallop.      Comments: No peripheral edema Pulmonary:     [place]: Pulmonary effort is normal. No respiratory distress.     Breath sounds: Normal breath sounds.  Skin:    General: Skin is warm, dry and intact.     Findings: No rash.  Psychiatric:        Mood and Affect: Mood and affect normal.        Speech: Speech normal.        Behavior: Behavior normal.        Thought Content: Thought content normal.       Results for orders placed or performed during the hospital encounter of 07/28/20  Surgical pathology  Result Value Ref Range   SURGICAL PATHOLOGY      SURGICAL PATHOLOGY CASE: [id number] PATIENT: Blake Garcia Surgical Pathology Report     Clinical History: Hx of polyps (jmc)     FINAL MICROSCOPIC DIAGNOSIS:  A. COLON, TRANSVERSE, POLYPECTOMY: - Tubular adenoma. - Negative for high grade dysplasia.   GROSS DESCRIPTION:  Received in formalin is a 1.5 x 0.9 x 0.3 cm polypoid portion of tan-pink mucosa.  The specimen is inked, sectioned and entirely submitted in 1 cassette.  [facility] 07/28/2020)   Final Diagnosis performed by Gillie Manners, MD.   Electronically signed 07/29/2020 Technical component performed at [facility], [place] [address], [place], [place] [address].   Professional component performed at [employer]. [facility], [place] [address] [place], [place] [address].  Immunohistochemistry Technical component (if applicable) was performed at [facility]. [address] [place], [place], [place] [address].   IMMUNOHISTOCHEMISTRY DISCLAIMER (if applic able): Some of these immunohistochemical stains may have been developed and the performance characteristics determine by [facility]. Some may not have been cleared or approved by the U.S. Food and Drug Administration. The FDA has determined  that such clearance or approval is not necessary. This test is used for clinical purposes. It should not be regarded as investigational or for research. This laboratory is certified under the [place] (CLIA-88) as qualified to perform high complexity clinical laboratory testing.  The controls stained appropriately.     This visit occurred during the SARS-CoV-2 public health emergency.  Safety protocols were in place, including screening questions prior to the visit, additional usage of staff PPE, and extensive cleaning of exam room while observing appropriate contact time as indicated for disinfecting solutions.   COVID 19 screen:  No recent travel or known exposure to COVID19 The patient denies respiratory symptoms of COVID 19 at this time. The importance of social distancing was discussed today.   Assessment and Plan Problem List Items Addressed This Visit    Morbid obesity with BMI of 70 and over, adult ([place]) - Primary (Chronic)    He continues to be engaged and working hard of diet changes and increasing activity despite his limitations.  20 lb weight loss since 04/2020.  Cleared for surgery by cardiology and following one more weight managment OV with me.            Eliezer Lofts, MD

## 2020-09-03 ENCOUNTER — Telehealth: Payer: Self-pay | Admitting: *Deleted

## 2020-09-03 NOTE — Telephone Encounter (Signed)
Received fax from [place] requesting PA for Trulicity.  PA completed on CoverMyMeds and sent for review.  Can take 72 hours for a decision.  Previous insurance did not cover and patient used a Trulicity coupon.  [employer] notified of this via fax given that I don't think the PA will be approved.

## 2020-09-04 ENCOUNTER — Ambulatory Visit: Payer: 59 | Admitting: Family Medicine

## 2020-09-08 ENCOUNTER — Ambulatory Visit (INDEPENDENT_AMBULATORY_CARE_PROVIDER_SITE_OTHER): Payer: 59 | Admitting: Family Medicine

## 2020-09-08 ENCOUNTER — Other Ambulatory Visit: Payer: Self-pay

## 2020-09-08 VITALS — BP 206/88 | HR 98 | Temp 97.0°F | Ht 67.0 in | Wt >= 6400 oz

## 2020-09-08 DIAGNOSIS — E1159 Type 2 diabetes mellitus with other circulatory complications: Secondary | ICD-10-CM

## 2020-09-08 DIAGNOSIS — I152 Hypertension secondary to endocrine disorders: Secondary | ICD-10-CM

## 2020-09-08 DIAGNOSIS — Z6841 Body Mass Index (BMI) 40.0 and over, adult: Secondary | ICD-10-CM

## 2020-09-08 NOTE — Patient Instructions (Addendum)
Set up yearly eye exam for diabetes and have the opthalmologist send [name] a copy of the evaluation for the chart. Keep up with the great work!

## 2020-09-08 NOTE — Assessment & Plan Note (Signed)
Well controlled previously and at home.. today likely elevated given frustration and exertion. Continue current meds.

## 2020-09-08 NOTE — Progress Notes (Signed)
Patient ID: Blake Garcia, male    DOB: 1955/09/05, 66 y.o.   MRN: [id number]  This visit was conducted in person.  Ht 5\' 7"  (1.702 m)   BMI 81.68 kg/m    CC:  Chief Complaint  Patient presents with  . Weight Management    Subjective:   HPI: Blake Garcia is a 65 y.o. male presenting on 09/08/2020 for Weight Management  Visit # 6  On Trulicity 3 mg weekly  Wt loss since last OV: 0 Wt loss since start of regimen: 8 lbs ( has gained 11 lbs in last month?) Wt Readings from Last 3 Encounters:  09/08/20 (!) 532 lb 4 oz (241.4 kg)  08/14/20 (!) 521 lb 8 oz (236.6 kg)  07/28/20 (!) 522 lb (236.8 kg)   He is scheduled for bariatric surgery on ? Should here within next week. Son and girlfriend will help him at home after the surgery. Getting tips from [facility].He is doing well overall.. working on foot pedaling and band exercises   No fried foods, no soft drinks.  Has ben drinking more water. At home BPs running  132/88 BP high today because her was having motorized chair issues.   06/2020 A1C 6 CMET normal except low potasssium, lipids no done since last 01/2020 LDL 73, HDl 61     Relevant past medical, surgical, family and social history reviewed and updated as indicated. Interim medical history since our last visit reviewed. Allergies and medications reviewed and updated. Outpatient Medications Prior to Visit  Medication Sig Dispense Refill  . amLODipine (NORVASC) 5 MG tablet Take 5 mg by mouth daily.    [name] aspirin EC 81 MG tablet Take 81 mg by mouth daily.    . cyclobenzaprine (FLEXERIL) 10 MG tablet TAKE 1 TABLET BY MOUTH AT BEDTIME AS NEEDED FOR MUSCLE SPASMS (Patient taking differently: Take 10 mg by mouth at bedtime as needed for muscle spasms.) 15 tablet 0  . diclofenac (VOLTAREN) 75 MG EC tablet Take 75 mg by mouth 2 (two) times daily as needed for moderate pain.    . Dulaglutide (TRULICITY) 3 TD/1.7OH SOPN Inject 0.5 mLs (3 mg total) as  directed once a week. 3 mL 11  . metoprolol succinate (TOPROL-XL) 50 MG 24 hr tablet TAKE ONE TABLET EVERY DAY WITH OR IMMEDIATELY FOLLOWING A MEAL (Patient taking differently: Take 50 mg by mouth daily.) 90 tablet 0  . Multiple Vitamins-Minerals (MULTIVITAMIN ADULTS 50+) TABS Take 1 tablet by mouth daily.    . OXYGEN Inhale 2 L/min into the lungs continuous.    . potassium chloride SA (KLOR-CON) 20 MEQ tablet Taking 1 1/2 tablet daily. (Patient taking differently: Take 30 mEq by mouth daily. Taking 1 1/2 tablet daily.) 135 tablet 1  . simvastatin (ZOCOR) 20 MG tablet TAKE ONE TABLET AT BEDTIME (Patient taking differently: Take 20 mg by mouth at bedtime.) 90 tablet 3  . triamcinolone cream (KENALOG) 0.1 % APPLY 1 APPLICATION TOPICALLY 2 (TWO) TIMES DAILY. AS NEEDED (Patient taking differently: Apply 1 application topically 2 (two) times daily as needed (irritation). APPLY 1 APPLICATION TOPICALLY 2 (TWO) TIMES DAILY AS NEEDED) 453.6 g 0  . valsartan-hydrochlorothiazide (DIOVAN-HCT) 320-25 MG tablet TAKE ONE TABLET EVERY DAY (Patient taking differently: Take 1 tablet by mouth daily.) 90 tablet 1  . venlafaxine XR (EFFEXOR-XR) 75 MG 24 hr capsule TAKE 3 CAPSULES EVERY DAY (Patient taking differently: Take 225 mg by mouth daily with breakfast. TAKE 3 CAPSULES EVERY  DAY) 270 capsule 1   No facility-administered medications prior to visit.     Per HPI unless specifically indicated in ROS section below Review of Systems  Constitutional: Negative for fatigue and fever.  HENT: Negative for ear pain.   Eyes: Negative for pain.  Respiratory: Negative for cough and shortness of breath.   Cardiovascular: Negative for chest pain, palpitations and leg swelling.  Gastrointestinal: Negative for abdominal pain.  Genitourinary: Negative for dysuria.  Musculoskeletal: Negative for arthralgias.  Neurological: Negative for syncope, light-headedness and headaches.  Psychiatric/Behavioral: Negative for dysphoric  mood.   Objective:  Ht 5\' 7"  (1.702 m)   BMI 81.68 kg/m   Wt Readings from Last 3 Encounters:  08/14/20 (!) 521 lb 8 oz (236.6 kg)  07/28/20 (!) 522 lb (236.8 kg)  07/10/20 (!) 523 lb (237.2 kg)      Physical Exam Constitutional:      Appearance: He is well-developed. He is obese.  HENT:     Head: Normocephalic.     Right Ear: Hearing normal.     Left Ear: Hearing normal.     Nose: Nose normal.  Neck:     Thyroid: No thyroid mass or thyromegaly.     Vascular: No carotid bruit.     Trachea: Trachea normal.  Cardiovascular:     Rate and Rhythm: Normal rate and regular rhythm.     Pulses: Normal pulses.     Heart sounds: Heart sounds not distant. No murmur heard. No friction rub. No gallop.      Comments: No peripheral edema Pulmonary:     [place]: Pulmonary effort is normal. No respiratory distress.     Breath sounds: Normal breath sounds.  Skin:    General: Skin is warm and dry.     Findings: No rash.  Psychiatric:        Speech: Speech normal.        Behavior: Behavior normal.        Thought Content: Thought content normal.       Results for orders placed or performed during the hospital encounter of 07/28/20  Surgical pathology  Result Value Ref Range   SURGICAL PATHOLOGY      SURGICAL PATHOLOGY CASE: [id number] PATIENT: Quinn Speigner Surgical Pathology Report     Clinical History: Hx of polyps (jmc)     FINAL MICROSCOPIC DIAGNOSIS:  A. COLON, TRANSVERSE, POLYPECTOMY: - Tubular adenoma. - Negative for high grade dysplasia.   GROSS DESCRIPTION:  Received in formalin is a 1.5 x 0.9 x 0.3 cm polypoid portion of tan-pink mucosa.  The specimen is inked, sectioned and entirely submitted in 1 cassette.  [facility] 07/28/2020)   Final Diagnosis performed by Gillie Manners, MD.   Electronically signed 07/29/2020 Technical component performed at [facility], [place] [address] [place], [place] [address].  Professional component  performed at [employer]. [facility], [place] [address], [place], [place] [address].  Immunohistochemistry Technical component (if applicable) was performed at [facility]. [address], [place], [place], [place] [address].   IMMUNOHISTOCHEMISTRY DISCLAIMER (if applic able): Some of these immunohistochemical stains may have been developed and the performance characteristics determine by [facility]. Some may not have been cleared or approved by the U.S. Food and Drug Administration. The FDA has determined that such clearance or approval is not necessary. This test is used for clinical purposes. It should not be regarded as investigational or for research. This laboratory is certified under the Clinical Laboratory  Improvement Amendments of 1988 (CLIA-88) as qualified to perform high complexity clinical laboratory testing.  The controls stained appropriately.     This visit occurred during the SARS-CoV-2 public health emergency.  Safety protocols were in place, including screening questions prior to the visit, additional usage of staff PPE, and extensive cleaning of exam room while observing appropriate contact time as indicated for disinfecting solutions.   COVID 19 screen:  No recent travel or known exposure to COVID19 The patient denies respiratory symptoms of COVID 19 at this time. The importance of social distancing was discussed today.   Assessment and Plan Problem List Items Addressed This Visit    Hypertension associated with diabetes ([place]) (Chronic)    Well controlled previously and at home.. today likely elevated given frustration and exertion. Continue current meds.      Morbid obesity with BMI of 70 and over, adult ([place]) - Primary (Chronic)    Ready and cleared for bariatric surgery. Encouraged exercise, weight loss, healthy eating habits.  See form completed and scanned for documentation.       Other Visit Diagnoses    BMI 70 and  over, adult ([place])             Eliezer Lofts, MD

## 2020-09-08 NOTE — Assessment & Plan Note (Addendum)
Ready and cleared for bariatric surgery. Encouraged exercise, weight loss, healthy eating habits.  See form completed and scanned for documentation.

## 2020-09-28 ENCOUNTER — Other Ambulatory Visit: Payer: Self-pay | Admitting: Family Medicine

## 2020-09-28 ENCOUNTER — Other Ambulatory Visit: Payer: Self-pay

## 2020-09-28 MED ORDER — AMLODIPINE BESYLATE 5 MG PO TABS: 5.0000 mg | ORAL_TABLET | Freq: Every day | ORAL | 1 refills | Status: DC

## 2020-09-28 MED ORDER — TRIAMCINOLONE ACETONIDE 0.1 % EX CREA: TOPICAL_CREAM | CUTANEOUS | 0 refills | Status: DC

## 2020-09-28 NOTE — Telephone Encounter (Signed)
Last office visit 09/08/2020 for weight management.  Last refilled 05/28/2019 for 453.6g with no refills. No future appointments with PCP.

## 2020-11-27 ENCOUNTER — Ambulatory Visit: Payer: 59 | Admitting: Family Medicine

## 2020-11-27 ENCOUNTER — Telehealth: Payer: Self-pay | Admitting: Family Medicine

## 2020-11-27 DIAGNOSIS — Z125 Encounter for screening for malignant neoplasm of prostate: Secondary | ICD-10-CM

## 2020-11-27 DIAGNOSIS — E118 Type 2 diabetes mellitus with unspecified complications: Secondary | ICD-10-CM

## 2020-11-27 NOTE — Telephone Encounter (Signed)
Wt Readings from Last 3 Encounters:  09/08/20 (!) 532 lb 4 oz (241.4 kg)  08/14/20 (!) 521 lb 8 oz (236.6 kg)  07/28/20 (!) 522 lb (236.8 kg)

## 2020-11-27 NOTE — Telephone Encounter (Signed)
-----   Message from Terri J Walsh sent at 11/20/2020 11:07 AM EDT ----- Regarding: Lab orders for Friday  5.6.22 Patient is scheduled for CPX labs, please order future labs, Thanks , Terri   

## 2020-11-27 NOTE — Telephone Encounter (Signed)
-----   Message from Ellamae Sia sent at 11/20/2020 11:07 AM EDT ----- Regarding: Lab orders for Friday  5.6.22 Patient is scheduled for CPX labs, please order future labs, Thanks , Karna Christmas

## 2020-12-04 ENCOUNTER — Other Ambulatory Visit (INDEPENDENT_AMBULATORY_CARE_PROVIDER_SITE_OTHER): Payer: 59

## 2020-12-04 ENCOUNTER — Other Ambulatory Visit: Payer: Self-pay

## 2020-12-04 DIAGNOSIS — E118 Type 2 diabetes mellitus with unspecified complications: Secondary | ICD-10-CM

## 2020-12-04 DIAGNOSIS — Z125 Encounter for screening for malignant neoplasm of prostate: Secondary | ICD-10-CM

## 2020-12-04 LAB — COMPREHENSIVE METABOLIC PANEL WITH GFR
ALT: 34 U/L (ref 0–53)
AST: 20 U/L (ref 0–37)
Albumin: 4.1 g/dL (ref 3.5–5.2)
Alkaline Phosphatase: 91 U/L (ref 39–117)
BUN: 15 mg/dL (ref 6–23)
CO2: 32 meq/L (ref 19–32)
Calcium: 9.7 mg/dL (ref 8.4–10.5)
Chloride: 99 meq/L (ref 96–112)
Creatinine, Ser: 0.81 mg/dL (ref 0.40–1.50)
GFR: 93.4 mL/min
Glucose, Bld: 109 mg/dL — ABNORMAL HIGH (ref 70–99)
Potassium: 3.9 meq/L (ref 3.5–5.1)
Sodium: 142 meq/L (ref 135–145)
Total Bilirubin: 0.5 mg/dL (ref 0.2–1.2)
Total Protein: 7 g/dL (ref 6.0–8.3)

## 2020-12-04 LAB — HEMOGLOBIN A1C: Hgb A1c MFr Bld: 5.7 % (ref 4.6–6.5)

## 2020-12-04 LAB — LIPID PANEL
Cholesterol: 140 mg/dL (ref 0–200)
HDL: 49.5 mg/dL
LDL Cholesterol: 72 mg/dL (ref 0–99)
NonHDL: 90.77
Total CHOL/HDL Ratio: 3
Triglycerides: 96 mg/dL (ref 0.0–149.0)
VLDL: 19.2 mg/dL (ref 0.0–40.0)

## 2020-12-04 LAB — PSA: PSA: 1.51 ng/mL (ref 0.10–4.00)

## 2020-12-04 NOTE — Progress Notes (Signed)
No critical labs need to be addressed urgently. We will discuss labs in detail at upcoming office visit.   

## 2020-12-08 ENCOUNTER — Telehealth: Payer: Self-pay

## 2020-12-08 NOTE — Telephone Encounter (Signed)
Patient is scheduled to come in for CPE on 12/11/20 but due to the weather on that day patient wanted to know if he could come on Thursday instead due to he is in a power wheelchair and it makes it more challenging in a bad weather to get in and out. Can he switch? There are openings on that day but Dr Diona Browner already has 5 CPEs. Patient is ok if not but thought to ask first.

## 2020-12-08 NOTE — Telephone Encounter (Signed)
Okay to switch?  

## 2020-12-08 NOTE — Telephone Encounter (Signed)
Appointment rescheduled to 12/10/2020 at 3:20 pm.  Elta Guadeloupe notified by telephone.

## 2020-12-10 ENCOUNTER — Ambulatory Visit (INDEPENDENT_AMBULATORY_CARE_PROVIDER_SITE_OTHER): Payer: 59 | Admitting: Cardiovascular Disease

## 2020-12-10 ENCOUNTER — Encounter: Payer: Self-pay | Admitting: Family Medicine

## 2020-12-10 ENCOUNTER — Ambulatory Visit (INDEPENDENT_AMBULATORY_CARE_PROVIDER_SITE_OTHER): Payer: 59 | Admitting: Family Medicine

## 2020-12-10 ENCOUNTER — Encounter: Payer: Self-pay | Admitting: *Deleted

## 2020-12-10 ENCOUNTER — Other Ambulatory Visit: Payer: Self-pay

## 2020-12-10 ENCOUNTER — Encounter: Payer: Self-pay | Admitting: Cardiovascular Disease

## 2020-12-10 VITALS — BP 150/100 | HR 87 | Temp 98.3°F | Ht 67.0 in | Wt >= 6400 oz

## 2020-12-10 VITALS — BP 140/84 | HR 93 | Ht 67.0 in | Wt >= 6400 oz

## 2020-12-10 DIAGNOSIS — E1159 Type 2 diabetes mellitus with other circulatory complications: Secondary | ICD-10-CM | POA: Diagnosis not present

## 2020-12-10 DIAGNOSIS — E785 Hyperlipidemia, unspecified: Secondary | ICD-10-CM | POA: Diagnosis not present

## 2020-12-10 DIAGNOSIS — I493 Ventricular premature depolarization: Secondary | ICD-10-CM | POA: Diagnosis not present

## 2020-12-10 DIAGNOSIS — I5032 Chronic diastolic (congestive) heart failure: Secondary | ICD-10-CM

## 2020-12-10 DIAGNOSIS — F3342 Major depressive disorder, recurrent, in full remission: Secondary | ICD-10-CM

## 2020-12-10 DIAGNOSIS — E118 Type 2 diabetes mellitus with unspecified complications: Secondary | ICD-10-CM | POA: Diagnosis not present

## 2020-12-10 DIAGNOSIS — I10 Essential (primary) hypertension: Secondary | ICD-10-CM

## 2020-12-10 DIAGNOSIS — Z0000 Encounter for general adult medical examination without abnormal findings: Secondary | ICD-10-CM

## 2020-12-10 DIAGNOSIS — I152 Hypertension secondary to endocrine disorders: Secondary | ICD-10-CM

## 2020-12-10 DIAGNOSIS — E662 Morbid (severe) obesity with alveolar hypoventilation: Secondary | ICD-10-CM

## 2020-12-10 DIAGNOSIS — E1169 Type 2 diabetes mellitus with other specified complication: Secondary | ICD-10-CM

## 2020-12-10 DIAGNOSIS — Z9989 Dependence on other enabling machines and devices: Secondary | ICD-10-CM

## 2020-12-10 DIAGNOSIS — G4733 Obstructive sleep apnea (adult) (pediatric): Secondary | ICD-10-CM | POA: Diagnosis not present

## 2020-12-10 DIAGNOSIS — Z6841 Body Mass Index (BMI) 40.0 and over, adult: Secondary | ICD-10-CM

## 2020-12-10 DIAGNOSIS — F332 Major depressive disorder, recurrent severe without psychotic features: Secondary | ICD-10-CM

## 2020-12-10 NOTE — Patient Instructions (Signed)
Medication Instructions:  Your physician has recommended you make the following change in your medication:   STOP Aspirin  Continue your other medications as prescribed.  *If you need a refill on your cardiac medications before your next appointment, please call your pharmacy*   Lab Work: None ordered If you have labs (blood work) drawn today and your tests are completely normal, you will receive your results only by: [name] MyChart Message (if you have MyChart) OR . A paper copy in the mail If you have any lab test that is abnormal or we need to change your treatment, we will call you to review the results.   Testing/Procedures: None ordered   Follow-Up: At [facility], you and your health needs are our priority.  As part of our continuing mission to provide you with exceptional heart care, we have created designated Provider Care Teams.  These Care Teams include your primary Cardiologist (physician) and Advanced Practice Providers (APPs -  Physician Assistants and Nurse Practitioners) who all work together to provide you with the care you need, when you need it.  We recommend signing up for the patient portal called "MyChart".  Sign up information is provided on this After Visit Summary.  MyChart is used to connect with patients for Virtual Visits (Telemedicine).  Patients are able to view lab/test results, encounter notes, upcoming appointments, etc.  Non-urgent messages can be sent to your provider as well.   To learn more about what you can do with MyChart, go to [email or web address].    Your next appointment:   Your physician wants you to follow-up in: 6 months You will receive a reminder letter in the mail two months in advance. If you don't receive a letter, please call our office to schedule the follow-up appointment.   The format for your next appointment:   In Person  Provider:   You may see Kathlyn Sacramento, MD or one of the following Advanced Practice Providers on your  designated Care Team:    Murray Hodgkins, NP  Christell Faith, PA-C  Marrianne Mood, PA-C  Cadence [place], [place]  Laurann Montana, NP    Other Instructions N/A

## 2020-12-10 NOTE — Assessment & Plan Note (Signed)
Stable, chronic.  Continue current medication.    

## 2020-12-10 NOTE — Progress Notes (Signed)
Cardiology Office Note   Date:  12/10/2020   ID:  Blake Garcia, DOB 1956/02/15, MRN [id number]  PCP:  Blake Sanders, MD  Cardiologist:   Blake Sacramento, MD   Chief Complaint  Patient presents with  . Other    6 month f/u c/o elevated BP. Meds reviewed verbally with pt.      History of Present Illness: Blake Garcia is a 65 y.o. male who presents for a follow-up visit regarding PVCs and chronic diastolic heart failure.   Previous echocardiogram 2016 and 2019 both showed normal LV systolic function.  He was seen in February 2021 for exertional chest pain and fatigue.  He had palpitations.  ZIO monitor showed an average heart rate of 99 bpm with 3 episodes of wide-complex tachycardia longest lasted 15 beats and 4 runs of SVT.  Occasional PVCs with a burden of 2.6%.  Echocardiogram in February of 2021 showed normal LV systolic function with severe left ventricular hypertrophy. He was seen by EP and was started on a beta-blocker.   He underwent gastric sleeve surgery in March of this year but reports having mild hypertension and thus he will be undergoing a second part in June.  He already lost 50 pounds and feels significantly better.  His antihypertensive medications were adjusted.  He denies any chest pain.  He reports improvement in shortness of breath.  He is trying to do more activities in the garden than before.   Past Medical History:  Diagnosis Date  . Basal cell carcinoma [id number]   Blake Garcia  . Cellulitis 02/09/2012   RLE  . Complication of anesthesia ~ 2000   "during OR for kidney stones; anesthesia RX made me code twice in one week"  . Depression   . Diastolic dysfunction    a. 11/2014 Echo: EF 50-55%, Gr 1 DD.  [name] History of blood transfusion 1958   "I was an Rh baby"  . History of kidney stones   . Hyperlipidemia   . Hypertension   . Kidney stones   . Midsternal chest pain   . Morbid obesity ([place])   . On home oxygen therapy   . OSA  (obstructive sleep apnea)    "wear CPAP"  . Personal history of colonic adenoma 09/14/2012  . Pre-diabetes     Past Surgical History:  Procedure Laterality Date  . CHOLECYSTECTOMY  1993  . COLONOSCOPY    . COLONOSCOPY WITH PROPOFOL N/A 07/28/2020   Procedure: COLONOSCOPY WITH PROPOFOL;  Surgeon: Blake Mayer, MD;  Location: [facility];  Service: Endoscopy;  Laterality: N/A;  . ESOPHAGOGASTRODUODENOSCOPY    . KNEE ARTHROSCOPY  ~ 2004   right  . LAPAROSCOPY     Surgical sleeve  . LITHOTRIPSY  2000  . POLYPECTOMY  07/28/2020   Procedure: POLYPECTOMY;  Surgeon: Blake Mayer, MD;  Location: [facility];  Service: Endoscopy;;     Current Outpatient Medications  Medication Sig Dispense Refill  . amLODipine (NORVASC) 2.5 MG tablet Take 2.5 mg by mouth daily.    [name] aspirin EC 81 MG tablet Take 81 mg by mouth daily.    . calcium gluconate 500 MG tablet Take 1 tablet by mouth 3 (three) times daily.    . metoprolol tartrate (LOPRESSOR) 25 MG tablet Take 25 mg by mouth 2 (two) times daily.    . Multiple Vitamins-Minerals (BARIATRIC MULTIVITAMINS/IRON) CAPS Take by mouth daily at 2 am.    . OXYGEN Inhale 2 L/min into the  lungs continuous.    . potassium chloride SA (KLOR-CON) 20 MEQ tablet Taking 1 1/2 tablet daily. 135 tablet 1  . simvastatin (ZOCOR) 20 MG tablet TAKE ONE TABLET AT BEDTIME 90 tablet 3  . triamcinolone (KENALOG) 0.1 % APPLY 1 APPLICATION TOPICALLY 2 (TWO) TIMES DAILY. AS NEEDED 453.6 g 0  . venlafaxine XR (EFFEXOR-XR) 75 MG 24 hr capsule TAKE 3 CAPSULES EVERY DAY 270 capsule 1   No current facility-administered medications for this visit.    Allergies:   Patient has no known allergies.    Social History:  The patient  reports that he has never smoked. He has never used smokeless tobacco. He reports that he does not drink alcohol and does not use drugs.   Family History:  The patient's family history includes ADD / ADHD in his daughter, son, and son; Cancer in his  maternal aunt and maternal uncle; Coronary artery disease in his brother and brother; Diabetes in his brother; Heart disease in his brother, brother, father, and mother; Hypertension in his mother; Stroke in his father.    ROS:  Please see the history of present illness.   Otherwise, review of systems are positive for none.   All other systems are reviewed and negative.    PHYSICAL EXAM: VS:  BP 140/84 (BP Location: Left Arm, Patient Position: Sitting, Cuff Size: Large)   Pulse 93   Ht 5\' 7"  (1.702 m)   Wt (!) 470 lb (213.2 kg)   SpO2 96%   BMI 73.61 kg/m  , BMI Body mass index is 73.61 kg/m. GEN: Well nourished, well developed, in no acute distress  HEENT: normal  Neck: no JVD, carotid bruits, or masses Cardiac: RRR; no murmurs, rubs, or gallops,no edema  Respiratory:  clear to auscultation bilaterally, normal work of breathing GI: soft, nontender, nondistended, + BS MS: no deformity or atrophy  Skin: warm and dry, no rash Neuro:  Strength and sensation are intact Psych: euthymic mood, full affect   EKG:  EKG is ordered today. The ekg ordered today demonstrates sinus rhythm with sinus arrhythmia, left axis deviation and nonspecific intraventricular block.   Recent Labs: 02/20/2020: Magnesium 2.0 12/04/2020: ALT 34; BUN 15; Creatinine, Ser 0.81; Potassium 3.9; Sodium 142    Lipid Panel    Component Value Date/Time   CHOL 140 12/04/2020 0837   TRIG 96.0 12/04/2020 0837   TRIG 130 08/10/2006 1022   HDL 49.50 12/04/2020 0837   CHOLHDL 3 12/04/2020 0837   VLDL 19.2 12/04/2020 0837   LDLCALC 72 12/04/2020 0837   LDLDIRECT 153.9 09/10/2007 1029      Wt Readings from Last 3 Encounters:  12/10/20 (!) 470 lb (213.2 kg)  09/08/20 (!) 532 lb 4 oz (241.4 kg)  08/14/20 (!) 521 lb 8 oz (236.6 kg)       No flowsheet data found.    ASSESSMENT AND PLAN:  1.  PVCs and short runs of SVT: Toprol was switched to metoprolol tartrate 25 mg twice daily given recent gastric sleeve  surgery and the need for short acting medications.  2.  Essential hypertension: He reports having hypotension during surgery with further adjustment of his medications and blood pressure seems to be reasonably controlled at the present time.  3.  Hyperlipidemia: Currently on simvastatin with most recent LDL of 72.  4. Obstructive sleep apnea on CPAP  5.  Preop cardiovascular evaluation for bariatric surgery: He is having the second part of surgery in June.  Given that there has  been no change in his symptoms, he can proceed with surgery at a reasonable cardiac risk without the need for further diagnostic cardiac testing.  I did asked him to discontinue aspirin 81 mg once daily.   Disposition:   FU with me in 6 months  Signed,  Blake Sacramento, MD  12/10/2020 12:13 PM    [place]

## 2020-12-10 NOTE — Assessment & Plan Note (Signed)
Able to decrease oxygen some with 50 lb weight loss.

## 2020-12-10 NOTE — Assessment & Plan Note (Signed)
Euvolemic on exam today. 

## 2020-12-10 NOTE — Progress Notes (Signed)
Patient ID: Blake Garcia, male    DOB: August 15, 1955, 65 y.o.   MRN: [id number]  This visit was conducted in person.  BP (!) 150/100   Pulse 87   Temp 98.3 F (36.8 C) (Temporal)   Ht 5\' 7"  (1.702 m)   Wt (!) 469 lb 8 oz (213 kg)   SpO2 98%   BMI 73.53 kg/m    CC:  Annual Wellness visit  Subjective:   HPI: Blake Garcia is a 65 y.o. male presenting on 12/10/2020 for No chief complaint on file.  S/P gastric sleeve... has second surgery on June 31  He has lost 50 lbs so far!  recent OV for PVC, HTN, OSA by cardiology. Dr. Fletcher Anon  Note reviewed form earlier today 12/10/2020  He is out doing yar work .. trying to stay active, trying to stand up. Using foot pedaler and stretch bands. He has been able to use oxygen less when at rest.  Uses when he is up on feet.    Wt Readings from Last 3 Encounters:  12/10/20 (!) 469 lb 8 oz (213 kg)  12/10/20 (!) 470 lb (213.2 kg)  09/08/20 (!) 532 lb 4 oz (241.4 kg)   Hypertension:    BP controlled at cardiologist earlier today on amlodipine , metoprolol and diovan... elevated in office here given attempted ambulation. BP Readings from Last 3 Encounters:  12/10/20 (!) 150/100  12/10/20 140/84  09/08/20 (!) 206/88  Using medication without problems or lightheadedness:  Chest pain with exertion: Edema: Short of breath: Average home BPs: Other issues:  Diabetes:  Great control on no medication. Lab Results  Component Value Date   HGBA1C 5.7 12/04/2020  Using medications without difficulties: Hypoglycemic episodes: none Hyperglycemic episodes: none Feet problems: no ulcers Blood Sugars averaging: FBS 109 eye exam within last year: due  Elevated Cholesterol:  LDL at goal on simvastatin 20 mg daily Lab Results  Component Value Date   CHOL 140 12/04/2020   HDL 49.50 12/04/2020   LDLCALC 72 12/04/2020   LDLDIRECT 153.9 09/10/2007   TRIG 96.0 12/04/2020   CHOLHDL 3 12/04/2020  Using medications without problems:  none Muscle aches:  none   On bariatric vitamin.. with iron, ca.  MDD   Stable control on venlafaxine 225 mg daily. He is somewhat more anxious alter in the day.  PHQ9; 7 GAD7: 4  Relevant past medical, surgical, family and social history reviewed and updated as indicated. Interim medical history since our last visit reviewed. Allergies and medications reviewed and updated. Outpatient Medications Prior to Visit  Medication Sig Dispense Refill  . amLODipine (NORVASC) 2.5 MG tablet Take 2.5 mg by mouth daily.    . Calcium Carb-Cholecalciferol (CALCIUM 500 + D PO)     . metoprolol tartrate (LOPRESSOR) 25 MG tablet Take 25 mg by mouth 2 (two) times daily.    . Multiple Vitamins-Minerals (BARIATRIC MULTIVITAMINS/IRON) CAPS Take by mouth daily at 2 am.    . OXYGEN Inhale 2 L/min into the lungs continuous.    . potassium chloride SA (KLOR-CON) 20 MEQ tablet Taking 1 1/2 tablet daily. 135 tablet 1  . simvastatin (ZOCOR) 20 MG tablet TAKE ONE TABLET AT BEDTIME 90 tablet 3  . triamcinolone (KENALOG) 0.1 % APPLY 1 APPLICATION TOPICALLY 2 (TWO) TIMES DAILY. AS NEEDED 453.6 g 0  . valsartan (DIOVAN) 320 MG tablet Take 320 mg by mouth daily.    [name] venlafaxine XR (EFFEXOR-XR) 75 MG 24 hr capsule TAKE 3 CAPSULES  EVERY DAY 270 capsule 1  . calcium gluconate 500 MG tablet Take 1 tablet by mouth 3 (three) times daily.     No facility-administered medications prior to visit.     Per HPI unless specifically indicated in ROS section below Review of Systems Objective:  BP (!) 150/100   Pulse 87   Temp 98.3 F (36.8 C) (Temporal)   Ht 5\' 7"  (1.702 m)   Wt (!) 469 lb 8 oz (213 kg)   SpO2 98%   BMI 73.53 kg/m   Wt Readings from Last 3 Encounters:  12/10/20 (!) 469 lb 8 oz (213 kg)  12/10/20 (!) 470 lb (213.2 kg)  09/08/20 (!) 532 lb 4 oz (241.4 kg)      Physical Exam    Results for orders placed or performed in visit on 12/04/20  PSA  Result Value Ref Range   PSA 1.51 0.10 - 4.00 ng/mL   Comprehensive metabolic panel  Result Value Ref Range   Sodium 142 135 - 145 mEq/L   Potassium 3.9 3.5 - 5.1 mEq/L   Chloride 99 96 - 112 mEq/L   CO2 32 19 - 32 mEq/L   Glucose, Bld 109 (H) 70 - 99 mg/dL   BUN 15 6 - 23 mg/dL   Creatinine, Ser 0.81 0.40 - 1.50 mg/dL   Total Bilirubin 0.5 0.2 - 1.2 mg/dL   Alkaline Phosphatase 91 39 - 117 U/L   AST 20 0 - 37 U/L   ALT 34 0 - 53 U/L   Total Protein 7.0 6.0 - 8.3 g/dL   Albumin 4.1 3.5 - 5.2 g/dL   GFR 93.40 >60.00 mL/min   Calcium 9.7 8.4 - 10.5 mg/dL  Lipid panel  Result Value Ref Range   Cholesterol 140 0 - 200 mg/dL   Triglycerides 96.0 0.0 - 149.0 mg/dL   HDL 49.50 >39.00 mg/dL   VLDL 19.2 0.0 - 40.0 mg/dL   LDL Cholesterol 72 0 - 99 mg/dL   Total CHOL/HDL Ratio 3    NonHDL 90.77   Hemoglobin A1c  Result Value Ref Range   Hgb A1c MFr Bld 5.7 4.6 - 6.5 %    This visit occurred during the SARS-CoV-2 public health emergency.  Safety protocols were in place, including screening questions prior to the visit, additional usage of staff PPE, and extensive cleaning of exam room while observing appropriate contact time as indicated for disinfecting solutions.   COVID 19 screen:  No recent travel or known exposure to COVID19 The patient denies respiratory symptoms of COVID 19 at this time. The importance of social distancing was discussed today.   Assessment and Plan The patient's preventative maintenance and recommended screening tests for an annual wellness exam were reviewed in full today. Brought up to date unless services declined.  Counselled on the importance of diet, exercise, and its role in overall health and mortality. The patient's FH and SH was reviewed, including their home life, tobacco status, and drug and alcohol status.   Vaccines: uptodate, considering 4th booster Prostate Cancer Screen:Stable, dad with pro cancer.  Lab Results  Component Value Date   PSA 1.51 12/04/2020   PSA 1.40 08/20/2019   PSA  2.06 08/08/2018        Colon Cancer Screen:colonoscopy  2021      Smoking Status:none ETOH/ drug HDQ:QIWL/NLGX Hep C:done HIV screen:done    Problem List Items Addressed This Visit    Controlled type 2 diabetes mellitus with complication, without long-term current use of  insulin (HCC) (Chronic)    Well controlled s/p gastric surgery.      Diastolic heart failure (HCC) (Chronic)    Euvolemic on exam today.      Hyperlipidemia associated with type 2 diabetes mellitus (HCC) (Chronic)    Stable, chronic.  Continue current medication.   Simvastatin 20 mg daily      Hypertension associated with diabetes (HCC) (Chronic)    Stable, chronic.  Continue current medication.         Hypoventilation associated with obesity ([place])    Able to decrease oxygen some with 50 lb weight loss.      Major depressive disorder, recurrent episode, severe (HCC) (Chronic)    Stable, chronic.  Continue current medication.    venlafaxine er 225 mg daily      Morbid obesity with BMI of 70 and over, adult [facility]) (Chronic)    Has second gastric surgery on June 31.       Other Visit Diagnoses    Routine general medical examination at a health care facility    -  Primary   MDD (major depressive disorder), recurrent, in full remission ([place])             Eliezer Lofts, MD

## 2020-12-10 NOTE — Patient Instructions (Addendum)
Okay to space out venlafaxine to twice a day. Set up yearly eye exam for diabetes and have the opthalmologist send [name] a copy of the evaluation for the chart.

## 2020-12-10 NOTE — Assessment & Plan Note (Signed)
Stable, chronic.  Continue current medication.    venlafaxine er 225 mg daily

## 2020-12-10 NOTE — Assessment & Plan Note (Signed)
Has second gastric surgery on June 31.

## 2020-12-10 NOTE — Assessment & Plan Note (Signed)
Well controlled s/p gastric surgery.

## 2020-12-10 NOTE — Assessment & Plan Note (Signed)
Stable, chronic.  Continue current medication.  Simvastatin 20 mg daily 

## 2020-12-11 ENCOUNTER — Encounter: Payer: 59 | Admitting: Family Medicine

## 2020-12-15 ENCOUNTER — Ambulatory Visit: Payer: 59 | Admitting: Cardiovascular Disease

## 2020-12-30 ENCOUNTER — Other Ambulatory Visit: Payer: Self-pay | Admitting: Family Medicine

## 2020-12-30 DIAGNOSIS — F3342 Major depressive disorder, recurrent, in full remission: Secondary | ICD-10-CM

## 2021-01-01 NOTE — Telephone Encounter (Signed)
Pharmacy requests refill on: Simvastatin 20 mg   LAST REFILL: 04/07/2020 (Q-90, R-3) LAST OV: 12/10/2020 NEXT OV: 06/15/2021 PHARMACY: [facility]   Pharmacy requests refill on: Venlafaxine XR 75 mg   LAST REFILL: 07/06/2020 (Q-270, R-1) LAST OV: 12/10/2020 NEXT OV: 06/15/2021 PHARMACY: [facility]   Pharmacy requests refill on: Potassium Chloride 20 mEq  LAST REFILL: 07/14/2020 (Q-135, R-1) LAST OV: 12/10/2020 NEXT OV: 06/15/2021 PHARMACY: [facility]    '

## 2021-02-16 ENCOUNTER — Other Ambulatory Visit: Payer: Self-pay | Admitting: Internal Medicine

## 2021-02-17 NOTE — Telephone Encounter (Signed)
Waiting for Dr. Tyrell Antonio approval to refill per telephone note on 02/16/21.

## 2021-02-18 MED ORDER — VALSARTAN 320 MG PO TABS: 320.0000 mg | ORAL_TABLET | Freq: Every day | ORAL | 0 refills | Status: DC

## 2021-02-18 MED ORDER — AMLODIPINE BESYLATE 2.5 MG PO TABS: 2.5000 mg | ORAL_TABLET | Freq: Every day | ORAL | 0 refills | Status: DC

## 2021-02-18 NOTE — Telephone Encounter (Signed)
Pending response patient wanting an update as he will be out of meds tomorrow.

## 2021-04-08 ENCOUNTER — Ambulatory Visit (INDEPENDENT_AMBULATORY_CARE_PROVIDER_SITE_OTHER): Payer: Medicare Other | Admitting: Cardiovascular Disease

## 2021-04-08 ENCOUNTER — Encounter: Payer: Self-pay | Admitting: Cardiovascular Disease

## 2021-04-08 ENCOUNTER — Other Ambulatory Visit: Payer: Self-pay

## 2021-04-08 VITALS — BP 160/90 | HR 77 | Ht 67.0 in | Wt >= 6400 oz

## 2021-04-08 DIAGNOSIS — E785 Hyperlipidemia, unspecified: Secondary | ICD-10-CM

## 2021-04-08 DIAGNOSIS — I493 Ventricular premature depolarization: Secondary | ICD-10-CM

## 2021-04-08 DIAGNOSIS — I10 Essential (primary) hypertension: Secondary | ICD-10-CM | POA: Diagnosis not present

## 2021-04-08 DIAGNOSIS — G4733 Obstructive sleep apnea (adult) (pediatric): Secondary | ICD-10-CM

## 2021-04-08 DIAGNOSIS — Z9989 Dependence on other enabling machines and devices: Secondary | ICD-10-CM

## 2021-04-08 MED ORDER — METOPROLOL TARTRATE 25 MG PO TABS: 25.0000 mg | ORAL_TABLET | Freq: Two times a day (BID) | ORAL | 3 refills | Status: DC

## 2021-04-08 MED ORDER — AMLODIPINE BESYLATE 2.5 MG PO TABS: 2.5000 mg | ORAL_TABLET | Freq: Every day | ORAL | 3 refills | Status: DC

## 2021-04-08 MED ORDER — VALSARTAN 320 MG PO TABS: 320.0000 mg | ORAL_TABLET | Freq: Every day | ORAL | 3 refills | Status: DC

## 2021-04-08 NOTE — Patient Instructions (Signed)
Medication Instructions:  Your physician recommends that you continue on your current medications as directed. Please refer to the Current Medication list given to you today.  Your blood pressure medications have been refilled for 90 day supply.  *If you need a refill on your cardiac medications before your next appointment, please call your pharmacy*   Lab Work: None ordered If you have labs (blood work) drawn today and your tests are completely normal, you will receive your results only by: [place] (if you have MyChart) OR A paper copy in the mail If you have any lab test that is abnormal or we need to change your treatment, we will call you to review the results.   Testing/Procedures: None ordered   Follow-Up: At [facility], you and your health needs are our priority.  As part of our continuing mission to provide you with exceptional heart care, we have created designated Provider Care Teams.  These Care Teams include your primary Cardiologist (physician) and Advanced Practice Providers (APPs -  Physician Assistants and Nurse Practitioners) who all work together to provide you with the care you need, when you need it.  We recommend signing up for the patient portal called "MyChart".  Sign up information is provided on this After Visit Summary.  MyChart is used to connect with patients for Virtual Visits (Telemedicine).  Patients are able to view lab/test results, encounter notes, upcoming appointments, etc.  Non-urgent messages can be sent to your provider as well.   To learn more about what you can do with MyChart, go to [email or web address].    Your next appointment:   6 month(s)  The format for your next appointment:   In Person  Provider:   You may see Kathlyn Sacramento, MD or one of the following Advanced Practice Providers on your designated Care Team:   Murray Hodgkins, NP Christell Faith, PA-C Marrianne Mood, PA-C Cadence Kathlen Mody, [place]   Other  Instructions N/A

## 2021-04-08 NOTE — Progress Notes (Signed)
Cardiology Office Note   Date:  04/08/2021   ID:  Blake Garcia, DOB 1956/02/11, MRN [id number]  PCP:  Jinny Sanders, MD  Cardiologist:   Kathlyn Sacramento, MD   Chief Complaint  Patient presents with   Other    F/u hospital f/u gastric bypass and reviewed meds. Meds reviewed verbally with pt.      History of Present Illness: Blake Garcia is a 65 y.o. male who presents for a follow-up visit regarding PVCs and chronic diastolic heart failure.   Previous echocardiogram 2016 and 2019 both showed normal LV systolic function.  He was seen in February 2021 for exertional chest pain and fatigue.  He had palpitations.  ZIO monitor showed an average heart rate of 99 bpm with 3 episodes of wide-complex tachycardia longest lasted 15 beats and 4 runs of SVT.  Occasional PVCs with a burden of 2.6%.  Echocardiogram in February of 2021 showed normal LV systolic function with severe left ventricular hypertrophy. He was seen by EP and was started on a beta-blocker.   He underwent gastric sleeve surgery in March of this year but had mild hypotension and thus the surgery was not completed.  He returned back for the second part in June and did well.   He has lost more than 100 pounds since surgery.  He is feeling significantly better.  His blood pressure is elevated today but he reports better readings at home.  He denies chest pain, dyspnea or palpitations. He is excited about his son's wedding in December.   Past Medical History:  Diagnosis Date   Basal cell carcinoma [id number]   [facility]   Cellulitis [id number]   RLE   Complication of anesthesia ~ 2000   "during OR for kidney stones; anesthesia RX made me code twice in one week"   Depression    Diastolic dysfunction    a. 11/2014 Echo: EF 50-55%, Gr 1 DD.   History of blood transfusion 1958   "I was an Rh baby"   History of kidney stones    Hyperlipidemia    Hypertension    Kidney stones    Midsternal chest pain    Morbid  obesity ([place])    On home oxygen therapy    OSA (obstructive sleep apnea)    "wear CPAP"   Personal history of colonic adenoma 09/14/2012   Pre-diabetes     Past Surgical History:  Procedure Laterality Date   CHOLECYSTECTOMY  1993   COLONOSCOPY     COLONOSCOPY WITH PROPOFOL N/A 07/28/2020   Procedure: COLONOSCOPY WITH PROPOFOL;  Surgeon: Gatha Mayer, MD;  Location: [facility];  Service: Endoscopy;  Laterality: N/A;   ESOPHAGOGASTRODUODENOSCOPY     KNEE ARTHROSCOPY  ~ 2004   right   LAPAROSCOPY     Surgical sleeve   LITHOTRIPSY  2000   POLYPECTOMY  07/28/2020   Procedure: POLYPECTOMY;  Surgeon: Gatha Mayer, MD;  Location: [facility];  Service: Endoscopy;;     Current Outpatient Medications  Medication Sig Dispense Refill   Calcium Carb-Cholecalciferol (CALCIUM 500 + D PO)      Multiple Vitamins-Minerals (BARIATRIC MULTIVITAMINS/IRON) CAPS Take by mouth daily at 2 am.     OXYGEN Inhale 2 L/min into the lungs continuous.     potassium chloride SA (KLOR-CON) 20 MEQ tablet TAKE ONE AND A HALF TABLETS BY MOUTH EVERY DAY 135 tablet 1   simvastatin (ZOCOR) 20 MG tablet TAKE ONE TABLET AT BEDTIME 90 tablet  3   triamcinolone (KENALOG) 0.1 % APPLY 1 APPLICATION TOPICALLY 2 (TWO) TIMES DAILY. AS NEEDED 453.6 g 0   venlafaxine XR (EFFEXOR-XR) 75 MG 24 hr capsule TAKE 3 CAPSULES EVERY DAY 270 capsule 1   amLODipine (NORVASC) 2.5 MG tablet Take 1 tablet (2.5 mg total) by mouth daily. 90 tablet 3   metoprolol tartrate (LOPRESSOR) 25 MG tablet Take 1 tablet (25 mg total) by mouth every 12 (twelve) hours. 180 tablet 3   valsartan (DIOVAN) 320 MG tablet Take 1 tablet (320 mg total) by mouth daily. 90 tablet 3   No current facility-administered medications for this visit.    Allergies:   Patient has no known allergies.    Social History:  The patient  reports that he has never smoked. He has never used smokeless tobacco. He reports that he does not drink alcohol and does not use  drugs.   Family History:  The patient's family history includes ADD / ADHD in his daughter, son, and son; Cancer in his maternal aunt and maternal uncle; Coronary artery disease in his brother and brother; Diabetes in his brother; Heart disease in his brother, brother, father, and mother; Hypertension in his mother; Stroke in his father.    ROS:  Please see the history of present illness.   Otherwise, review of systems are positive for none.   All other systems are reviewed and negative.    PHYSICAL EXAM: VS:  BP (!) 160/90 (BP Location: Left Arm, Patient Position: Sitting, Cuff Size: Large)   Pulse 77   Ht '5\' 7"'$  (1.702 m)   Wt (!) 418 lb (189.6 kg)   SpO2 96%   BMI 65.47 kg/m  , BMI Body mass index is 65.47 kg/m. GEN: Well nourished, well developed, in no acute distress  HEENT: normal  Neck: no JVD, carotid bruits, or masses Cardiac: RRR; no murmurs, rubs, or gallops,no edema  Respiratory:  clear to auscultation bilaterally, normal work of breathing GI: soft, nontender, nondistended, + BS MS: no deformity or atrophy  Skin: warm and dry, no rash Neuro:  Strength and sensation are intact Psych: euthymic mood, full affect   EKG:  EKG is ordered today. The ekg ordered today demonstrates normal sinus rhythm with sinus arrhythmia, left axis deviation and right bundle branch block.  Recent Labs: 12/04/2020: ALT 34; BUN 15; Creatinine, Ser 0.81; Potassium 3.9; Sodium 142    Lipid Panel    Component Value Date/Time   CHOL 140 12/04/2020 0837   TRIG 96.0 12/04/2020 0837   TRIG 130 08/10/2006 1022   HDL 49.50 12/04/2020 0837   CHOLHDL 3 12/04/2020 0837   VLDL 19.2 12/04/2020 0837   LDLCALC 72 12/04/2020 0837   LDLDIRECT 153.9 09/10/2007 1029      Wt Readings from Last 3 Encounters:  04/08/21 (!) 418 lb (189.6 kg)  12/10/20 (!) 469 lb 8 oz (213 kg)  12/10/20 (!) 470 lb (213.2 kg)       No flowsheet data found.    ASSESSMENT AND PLAN:  1.  PVCs and short runs of SVT:  Well-controlled with metoprolol.  2.  Essential hypertension: Blood pressure is mildly elevated today but reports better readings at home.  Given that he is continuously losing weight, I elected not to increase his antihypertensive medications.  3.  Hyperlipidemia: Currently on simvastatin with most recent LDL of 72.  4. Obstructive sleep apnea on CPAP    Disposition:   FU with me in 6 months  Signed,  Rogue Jury  Fletcher Anon, MD  04/08/2021 4:55 PM    [facility]

## 2021-04-19 ENCOUNTER — Other Ambulatory Visit: Payer: Self-pay | Admitting: Family Medicine

## 2021-05-18 ENCOUNTER — Encounter: Payer: Self-pay | Admitting: Primary Care

## 2021-06-04 ENCOUNTER — Other Ambulatory Visit: Payer: Self-pay | Admitting: Family Medicine

## 2021-06-04 ENCOUNTER — Telehealth: Payer: Self-pay | Admitting: Family Medicine

## 2021-06-04 DIAGNOSIS — E118 Type 2 diabetes mellitus with unspecified complications: Secondary | ICD-10-CM

## 2021-06-04 NOTE — Telephone Encounter (Signed)
Patient has a lab appt 06/08/2021, no orders are in.

## 2021-06-07 LAB — HM DIABETES FOOT EXAM

## 2021-06-08 ENCOUNTER — Other Ambulatory Visit: Payer: Self-pay

## 2021-06-08 ENCOUNTER — Other Ambulatory Visit: Payer: 59

## 2021-06-08 ENCOUNTER — Other Ambulatory Visit (INDEPENDENT_AMBULATORY_CARE_PROVIDER_SITE_OTHER): Payer: Medicare Other

## 2021-06-08 DIAGNOSIS — E118 Type 2 diabetes mellitus with unspecified complications: Secondary | ICD-10-CM

## 2021-06-08 LAB — COMPREHENSIVE METABOLIC PANEL
ALT: 15 U/L (ref 0–53)
AST: 14 U/L (ref 0–37)
Albumin: 4 g/dL (ref 3.5–5.2)
Alkaline Phosphatase: 98 U/L (ref 39–117)
BUN: 13 mg/dL (ref 6–23)
CO2: 30 mEq/L (ref 19–32)
Calcium: 9.1 mg/dL (ref 8.4–10.5)
Chloride: 102 mEq/L (ref 96–112)
Creatinine, Ser: 0.77 mg/dL (ref 0.40–1.50)
GFR: 94.5 mL/min (ref >60.00)
Glucose, Bld: 101 mg/dL — ABNORMAL HIGH (ref 70–99)
Potassium: 3.6 mEq/L (ref 3.5–5.1)
Sodium: 139 mEq/L (ref 135–145)
Total Bilirubin: 0.5 mg/dL (ref 0.2–1.2)
Total Protein: 6.7 g/dL (ref 6.0–8.3)

## 2021-06-08 LAB — LIPID PANEL
Cholesterol: 113 mg/dL (ref 0–200)
HDL: 41 mg/dL (ref >39.00)
LDL Cholesterol: 50 mg/dL (ref 0–99)
NonHDL: 71.55
Total CHOL/HDL Ratio: 3
Triglycerides: 107 mg/dL (ref 0.0–149.0)
VLDL: 21.4 mg/dL (ref 0.0–40.0)

## 2021-06-08 LAB — HEMOGLOBIN A1C: Hgb A1c MFr Bld: 5.7 % (ref 4.6–6.5)

## 2021-06-08 NOTE — Progress Notes (Signed)
No critical labs need to be addressed urgently. We will discuss labs in detail at upcoming office visit.   

## 2021-06-15 ENCOUNTER — Other Ambulatory Visit: Payer: Self-pay

## 2021-06-15 ENCOUNTER — Ambulatory Visit (INDEPENDENT_AMBULATORY_CARE_PROVIDER_SITE_OTHER): Payer: Medicare Other | Admitting: Family Medicine

## 2021-06-15 ENCOUNTER — Encounter: Payer: Self-pay | Admitting: Family Medicine

## 2021-06-15 VITALS — BP 136/85 | HR 81 | Temp 98.2°F | Ht 67.0 in | Wt 396.1 lb

## 2021-06-15 DIAGNOSIS — E1169 Type 2 diabetes mellitus with other specified complication: Secondary | ICD-10-CM

## 2021-06-15 DIAGNOSIS — Z23 Encounter for immunization: Secondary | ICD-10-CM | POA: Diagnosis not present

## 2021-06-15 DIAGNOSIS — E118 Type 2 diabetes mellitus with unspecified complications: Secondary | ICD-10-CM

## 2021-06-15 DIAGNOSIS — Z6841 Body Mass Index (BMI) 40.0 and over, adult: Secondary | ICD-10-CM

## 2021-06-15 DIAGNOSIS — E785 Hyperlipidemia, unspecified: Secondary | ICD-10-CM

## 2021-06-15 DIAGNOSIS — E662 Morbid (severe) obesity with alveolar hypoventilation: Secondary | ICD-10-CM

## 2021-06-15 DIAGNOSIS — E1159 Type 2 diabetes mellitus with other circulatory complications: Secondary | ICD-10-CM

## 2021-06-15 DIAGNOSIS — R21 Rash and other nonspecific skin eruption: Secondary | ICD-10-CM

## 2021-06-15 DIAGNOSIS — I152 Hypertension secondary to endocrine disorders: Secondary | ICD-10-CM

## 2021-06-15 MED ORDER — NYSTATIN-TRIAMCINOLONE 100000-0.1 UNIT/GM-% EX CREA: 1.0000 "application " | TOPICAL_CREAM | Freq: Two times a day (BID) | CUTANEOUS | 0 refills | Status: DC

## 2021-06-15 NOTE — Progress Notes (Signed)
[id number]

## 2021-06-15 NOTE — Progress Notes (Signed)
Patient ID: Blake Garcia, male    DOB: 05-08-1956, 65 y.o.   MRN: [id number]  This visit was conducted in person.  BP (!) 150/88   Pulse 81   Temp 98.2 F (36.8 C) (Temporal)   Ht 5\' 7"  (1.702 m)   Wt (!) 396 lb 2 oz (179.7 kg)   SpO2 96%   BMI 62.04 kg/m    CC:  Chief Complaint  Patient presents with   Diabetes    Subjective:   HPI: Blake Garcia is a 65 y.o. male presenting on 06/15/2021 for Diabetes    S/P bariatric surgery on 01/28/21  He has since lost 100 Lbs  Wt Readings from Last 3 Encounters:  06/15/21 (!) 396 lb 2 oz (179.7 kg)  04/08/21 (!) 418 lb (189.6 kg)  12/10/20 (!) 469 lb 8 oz (213 kg)   Body mass index is 62.04 kg/m.  He is walking, able to do yard work now. He no longer needs scooter.  His next goal is to return to gym. Plans silver sneakers, pool exercise.   No longer requiring oxygen!   Diabetes:   Excellent control with diet Lab Results  Component Value Date   HGBA1C 5.7 06/08/2021  Using medications without difficulties: Hypoglycemic episodes: Hyperglycemic episodes: Feet problems: none Blood Sugars averaging: eye exam within last year: he is scheduled tommorow in [facility]  Hypertension:   On metoprolol  25 BID, valsartan 320 mg daily and low dose amlodipine  BP Readings from Last 3 Encounters:  06/15/21 (!) 150/88  04/08/21 (!) 160/90  12/10/20 (!) 150/100  Using medication without problems or lightheadedness: none Chest pain with exertion:none Edema:none Short of breath: improving Average home BPs:  At home 136/85  Other issues:  Elevated Cholesterol:  LDL at goal on  20 mg daily simvastatin Lab Results  Component Value Date   CHOL 113 06/08/2021   HDL 41.00 06/08/2021   LDLCALC 50 06/08/2021   LDLDIRECT 153.9 09/10/2007   TRIG 107.0 06/08/2021   CHOLHDL 3 06/08/2021  Using medications without problems: none Muscle aches:  none Diet compliance: gastric surgery diet, pushing fluids. Exercise:  increasing Other complaints:  Has noted rash at site of healed laproscopic  sites... very itchy, no warmth, no pain.  Has been present x  2 weeks.  Has yeast under breasts bilaterally.. using Nystatin powder.      Relevant past medical, surgical, family and social history reviewed and updated as indicated. Interim medical history since our last visit reviewed. Allergies and medications reviewed and updated. Outpatient Medications Prior to Visit  Medication Sig Dispense Refill   amLODipine (NORVASC) 2.5 MG tablet Take 1 tablet (2.5 mg total) by mouth daily. 90 tablet 3   Calcium Carb-Cholecalciferol (CALCIUM 500 + D PO) Take 3 tablets by mouth in the morning and at bedtime.     Cholecalciferol (VITAMIN D3) 25 MCG (1000 UT) CAPS Take 1 capsule by mouth daily.     metoprolol tartrate (LOPRESSOR) 25 MG tablet Take 1 tablet (25 mg total) by mouth every 12 (twelve) hours. 180 tablet 3   Multiple Vitamins-Minerals (BARIATRIC MULTIVITAMINS/IRON) CAPS Take 1 capsule by mouth daily.     nystatin (MYCOSTATIN/NYSTOP) powder Apply topically 2 (two) times daily as needed.     OXYGEN Inhale 2 L/min into the lungs continuous.     potassium chloride SA (KLOR-CON) 20 MEQ tablet TAKE ONE AND A HALF TABLETS BY MOUTH EVERY DAY 135 tablet 1   simvastatin (ZOCOR) 20  MG tablet TAKE ONE TABLET AT BEDTIME 90 tablet 3   triamcinolone (KENALOG) 0.1 % APPLY 1 APPLICATION TOPICALLY 2 (TWO) TIMES DAILY. AS NEEDED 453.6 g 0   valsartan (DIOVAN) 320 MG tablet Take 1 tablet (320 mg total) by mouth daily. 90 tablet 3   venlafaxine XR (EFFEXOR-XR) 75 MG 24 hr capsule TAKE 3 CAPSULES EVERY DAY 270 capsule 1   No facility-administered medications prior to visit.     Per HPI unless specifically indicated in ROS section below Review of Systems  Constitutional:  Negative for fatigue and fever.  HENT:  Negative for ear pain.   Eyes:  Negative for pain.  Respiratory:  Negative for cough and shortness of breath.    Cardiovascular:  Negative for chest pain, palpitations and leg swelling.  Gastrointestinal:  Negative for abdominal pain.  Genitourinary:  Negative for dysuria.  Musculoskeletal:  Negative for arthralgias.  Skin:  Positive for rash.  Neurological:  Negative for syncope, light-headedness and headaches.  Psychiatric/Behavioral:  Negative for dysphoric mood.   Objective:  BP (!) 150/88   Pulse 81   Temp 98.2 F (36.8 C) (Temporal)   Ht 5\' 7"  (1.702 m)   Wt (!) 396 lb 2 oz (179.7 kg)   SpO2 96%   BMI 62.04 kg/m   Wt Readings from Last 3 Encounters:  06/15/21 (!) 396 lb 2 oz (179.7 kg)  04/08/21 (!) 418 lb (189.6 kg)  12/10/20 (!) 469 lb 8 oz (213 kg)      Physical Exam Constitutional:      Appearance: He is well-developed. He is obese.  HENT:     Head: Normocephalic.     Right Ear: Hearing normal.     Left Ear: Hearing normal.     Nose: Nose normal.  Neck:     Thyroid: No thyroid mass or thyromegaly.     Vascular: No carotid bruit.     Trachea: Trachea normal.  Cardiovascular:     Rate and Rhythm: Normal rate and regular rhythm.     Pulses: Normal pulses.     Heart sounds: Heart sounds not distant. No murmur heard.   No friction rub. No gallop.     Comments: No peripheral edema Pulmonary:     Effort: Pulmonary effort is normal. No respiratory distress.     Breath sounds: Normal breath sounds.  Skin:    General: Skin is warm and dry.     Findings: Rash present.  Psychiatric:        Speech: Speech normal.        Behavior: Behavior normal.        Thought Content: Thought content normal.       Diabetic foot exam: Normal inspection No skin breakdown No calluses  Normal DP pulses Normal sensation to light touch and monofilament Nails normal  Results for orders placed or performed in visit on 06/08/21  Comprehensive metabolic panel  Result Value Ref Range   Sodium 139 135 - 145 mEq/L   Potassium 3.6 3.5 - 5.1 mEq/L   Chloride 102 96 - 112 mEq/L   CO2 30 19 -  32 mEq/L   Glucose, Bld 101 (H) 70 - 99 mg/dL   BUN 13 6 - 23 mg/dL   Creatinine, Ser 0.77 0.40 - 1.50 mg/dL   Total Bilirubin 0.5 0.2 - 1.2 mg/dL   Alkaline Phosphatase 98 39 - 117 U/L   AST 14 0 - 37 U/L   ALT 15 0 - 53 U/L  Total Protein 6.7 6.0 - 8.3 g/dL   Albumin 4.0 3.5 - 5.2 g/dL   GFR 94.50 >60.00 mL/min   Calcium 9.1 8.4 - 10.5 mg/dL  Lipid panel  Result Value Ref Range   Cholesterol 113 0 - 200 mg/dL   Triglycerides 107.0 0.0 - 149.0 mg/dL   HDL 41.00 >39.00 mg/dL   VLDL 21.4 0.0 - 40.0 mg/dL   LDL Cholesterol 50 0 - 99 mg/dL   Total CHOL/HDL Ratio 3    NonHDL 71.55   Hemoglobin A1c  Result Value Ref Range   Hgb A1c MFr Bld 5.7 4.6 - 6.5 %    This visit occurred during the SARS-CoV-2 public health emergency.  Safety protocols were in place, including screening questions prior to the visit, additional usage of staff PPE, and extensive cleaning of exam room while observing appropriate contact time as indicated for disinfecting solutions.   COVID 19 screen:  No recent travel or known exposure to COVID19 The patient denies respiratory symptoms of COVID 19 at this time. The importance of social distancing was discussed today.   Assessment and Plan    Problem List Items Addressed This Visit     Controlled type 2 diabetes mellitus with complication, without long-term current use of insulin ([place]) - Primary (Chronic)     Diet controlled.      Hyperlipidemia associated with type 2 diabetes mellitus ([facility]) (Chronic)    Stable, chronic.  Continue current medication.    simvastatin 20 mg daily      Hypertension associated with diabetes ([place]) (Chronic)     At goal at home. Encouraged exercise, weight loss, healthy eating habits.       Morbid obesity with BMI of 70 and over, adult [facility]) (Chronic)    Improving s/p gastric surgery.      Hypoventilation associated with obesity ([place])    Resolving with weight loss. NO longer requiring oxygen except prn.      Skin  rash    Not clearly bacterial.. more likely allergic dermatitis vs candida.  treat with nystatin/triamconilone cream.. if redness spreading call.      Other Visit Diagnoses     Need for influenza vaccination       Relevant Orders   Flu Vaccine QUAD 6+ mos PF IM (Fluarix Quad PF) (Completed)        Eliezer Lofts, MD

## 2021-06-15 NOTE — Assessment & Plan Note (Signed)
Improving s/p gastric surgery.

## 2021-06-15 NOTE — Assessment & Plan Note (Signed)
At goal at home. Encouraged exercise, weight loss, healthy eating habits.

## 2021-06-15 NOTE — Assessment & Plan Note (Signed)
Diet controlled.  

## 2021-06-15 NOTE — Assessment & Plan Note (Signed)
Resolving with weight loss. NO longer requiring oxygen except prn.

## 2021-06-15 NOTE — Assessment & Plan Note (Signed)
Not clearly bacterial.. more likely allergic dermatitis vs candida.  treat with nystatin/triamconilone cream.. if redness spreading call.

## 2021-06-15 NOTE — Assessment & Plan Note (Signed)
Stable, chronic.  Continue current medication.    simvastatin 20 mg daily

## 2021-06-16 ENCOUNTER — Encounter: Payer: Self-pay | Admitting: Family Medicine

## 2021-06-16 LAB — HM DIABETES EYE EXAM

## 2021-06-30 ENCOUNTER — Encounter: Payer: Self-pay | Admitting: Cardiovascular Disease

## 2021-06-30 MED ORDER — AMLODIPINE BESYLATE 5 MG PO TABS: 5.0000 mg | ORAL_TABLET | Freq: Every day | ORAL | 1 refills | Status: DC

## 2021-06-30 NOTE — Telephone Encounter (Signed)
Called the patient after receiving his mychart message requesting a call.  Patient sts that his BP has been running high for about 1 week. BP readings included in the pt mychart message:  166/91, 167/82, 167/96, 143/91.  Pt weight is now 390#. (After bariatric surgery) HRs 70-77 bpm He checks his BP when he takes his meds in the morning and again around mid day. He has not noticed a big difference between those redaings.  He is taking  Diovan 320 mg daily Amlodipine 2.5 mg daily Metoprolol tartrate 25 mg bid.  Pt reports just a mild headache. He wonders is he needs to increase his Amlodipine.  Adv the patient that I will fwd the message to Dr. Fletcher Anon for his recommendation. Patient voiced appreciation.

## 2021-07-13 ENCOUNTER — Other Ambulatory Visit: Payer: Self-pay | Admitting: Family Medicine

## 2021-08-24 NOTE — Progress Notes (Signed)
[facility] [place], [place] [address]  Pulmonary Sleep Medicine   Office Visit Note  Patient Name: Blake Garcia DOB: 1956-06-21 MRN [id number]    Chief Complaint: Obstructive Sleep Apnea visit  Brief History:  Blake Garcia is seen today for initial consult for CPAP@ 14 cmH2O and is interested in getting a new unit as his modem is no longer transmitting data. The patient has a 20 year history of sleep apnea. Patient is using PAP nightly.  The patient feels rested after sleeping with PAP.  The patient reports benefiting from PAP use. Reported sleepiness is improved and the Epworth Sleepiness Score is 10 out of 24. The patient does take naps daily. The patient complains of the following: occasional mild headaches. The compliance download shows 88% compliance with an average use time of 6 hours 1 minute. The AHI is 2.2.  The patient doe snot complain of limb movements disrupting sleep. Patient's prior sleep study is 66 years old therefore will retest before getting new machine.   ROS  General: (-) fever, (-) chills, (-) night sweat Nose and Sinuses: (-) nasal stuffiness or itchiness, (-) postnasal drip, (-) nosebleeds, (-) sinus trouble. Mouth and Throat: (-) sore throat, (-) hoarseness. Neck: (-) swollen glands, (-) enlarged thyroid, (-) neck pain. Respiratory: - cough, - shortness of breath, - wheezing. Neurologic: - numbness, - tingling. Psychiatric: + anxiety, - depression   Current Medication: Outpatient Encounter Medications as of 08/25/2021  Medication Sig Note   amLODipine (NORVASC) 5 MG tablet Take 1 tablet (5 mg total) by mouth daily.    Calcium Carb-Cholecalciferol (CALCIUM 500 + D PO) Take 3 tablets by mouth in the morning and at bedtime.    Cholecalciferol (VITAMIN D3) 25 MCG (1000 UT) CAPS Take 1 capsule by mouth daily.    metoprolol tartrate (LOPRESSOR) 25 MG tablet Take 1 tablet (25 mg total) by mouth every 12 (twelve) hours.    Multiple  Vitamins-Minerals (BARIATRIC MULTIVITAMINS/IRON) CAPS Take 1 capsule by mouth daily.    nystatin (MYCOSTATIN/NYSTOP) powder Apply topically 2 (two) times daily as needed.    nystatin-triamcinolone (MYCOLOG II) cream Apply 1 application topically 2 (two) times daily.    OXYGEN Inhale 2 L/min into the lungs continuous. 04/08/2021: PRN   potassium chloride SA (KLOR-CON M) 20 MEQ tablet TAKE ONE AND A HALF TABLETS BY MOUTH EVERY DAY    simvastatin (ZOCOR) 20 MG tablet TAKE ONE TABLET AT BEDTIME    triamcinolone (KENALOG) 0.1 % APPLY 1 APPLICATION TOPICALLY 2 (TWO) TIMES DAILY. AS NEEDED    valsartan (DIOVAN) 320 MG tablet Take 1 tablet (320 mg total) by mouth daily.    venlafaxine XR (EFFEXOR-XR) 75 MG 24 hr capsule TAKE 3 CAPSULES EVERY DAY    No facility-administered encounter medications on file as of 08/25/2021.    Surgical History: Past Surgical History:  Procedure Laterality Date   CHOLECYSTECTOMY  1993   COLONOSCOPY     COLONOSCOPY WITH PROPOFOL N/A 07/28/2020   Procedure: COLONOSCOPY WITH PROPOFOL;  Surgeon: Gatha Mayer, MD;  Location: [facility];  Service: Endoscopy;  Laterality: N/A;   ESOPHAGOGASTRODUODENOSCOPY     KNEE ARTHROSCOPY  ~ 2004   right   LAPAROSCOPY     Surgical sleeve   LITHOTRIPSY  2000   POLYPECTOMY  07/28/2020   Procedure: POLYPECTOMY;  Surgeon: Gatha Mayer, MD;  Location: [facility];  Service: Endoscopy;;    Medical History: Past Medical History:  Diagnosis Date   Basal cell carcinoma  [id number]   [place]   Cellulitis 3/81/8299   RLE   Complication of anesthesia ~ 2000   "during OR for kidney stones; anesthesia RX made me code twice in one week"   Depression    Diastolic dysfunction    a. 11/2014 Echo: EF 50-55%, Gr 1 DD.   History of blood transfusion 1958   "I was an Rh baby"   History of kidney stones    Hyperlipidemia    Hypertension    Kidney stones    Midsternal chest pain    Morbid obesity (HCC)    On home oxygen  therapy    OSA (obstructive sleep apnea)    "wear CPAP"   Personal history of colonic adenoma 09/14/2012   Pre-diabetes     Family History: Non contributory to the present illness  Social History: Social History   Socioeconomic History   Marital status: Divorced    Spouse name: Not on file   Number of children: 3   Years of education: Not on file   Highest education level: Some college, no degree  Occupational History   Occupation: not employed  Tobacco Use   Smoking status: Never   Smokeless tobacco: Never  Vaping Use   Vaping Use: Never used  Substance and Sexual Activity   Alcohol use: No    Alcohol/week: 0.0 standard drinks    Comment: 02/09/12 "may have driink at party once/year"   Drug use: No   Sexual activity: Not Currently  Other Topics Concern   Not on file  Social History Narrative   Uses a scooter for ambulation   He is disabled   He is divorced and has 3 children   No alcohol never smoker no drug use no current tobacco   Social Determinants of Radio broadcast assistant Strain: Not on file  Food Insecurity: Not on file  Transportation Needs: Not on file  Physical Activity: Not on file  Stress: Not on file  Social Connections: Not on file  Intimate Partner Violence: Not on file    Vital Signs: Blood pressure (!) 163/84, pulse 91, resp. rate (!) 24, height 5\' 7"  (1.702 m), weight (!) 377 lb (171 kg), SpO2 95 %. Body mass index is 59.05 kg/m.    Examination: General Appearance: The patient is well-developed, well-nourished, and in no distress. Neck Circumference: 52 Skin: Gross inspection of skin unremarkable. Head: normocephalic, no gross deformities. Eyes: no gross deformities noted. ENT: ears appear grossly normal Neurologic: Alert and oriented. No involuntary movements.    EPWORTH SLEEPINESS SCALE:  Scale:  (0)= no chance of dozing; (1)= slight chance of dozing; (2)= moderate chance of dozing; (3)= high chance of dozing  Chance   Situtation    Sitting and reading: 3    Watching TV: 1    Sitting Inactive in public: 0    As a passenger in car: 0      Lying down to rest: 3    Sitting and talking: 1    Sitting quielty after lunch: 2    In a car, stopped in traffic: 0   TOTAL SCORE:   10 out of 24    SLEEP STUDIES:  PSG (08/2001) RDI 17.2/hr, REM RDI 28.2/hr, min SpO2 83%   CPAP COMPLIANCE DATA:  Date Range: 08/25/2020-08/24/2021  Average Daily Use: 6 hours 1 minute  Median Use: 5 hours 57 minutes  Compliance for > 4 Hours: 88%  AHI: 2.2 respiratory events per hour  Days Used:  363/365 days  Mask Leak: 52  95th Percentile Pressure: 14.6         LABS: Recent Results (from the past 2160 hour(s))  HM DIABETES FOOT EXAM     Status: None   Collection Time: 06/07/21 12:00 AM  Result Value Ref Range   HM Diabetic Foot Exam done   Comprehensive metabolic panel     Status: Abnormal   Collection Time: 06/08/21  8:38 AM  Result Value Ref Range   Sodium 139 135 - 145 mEq/L   Potassium 3.6 3.5 - 5.1 mEq/L   Chloride 102 96 - 112 mEq/L   CO2 30 19 - 32 mEq/L   Glucose, Bld 101 (H) 70 - 99 mg/dL   BUN 13 6 - 23 mg/dL   Creatinine, Ser 0.77 0.40 - 1.50 mg/dL   Total Bilirubin 0.5 0.2 - 1.2 mg/dL   Alkaline Phosphatase 98 39 - 117 U/L   AST 14 0 - 37 U/L   ALT 15 0 - 53 U/L   Total Protein 6.7 6.0 - 8.3 g/dL   Albumin 4.0 3.5 - 5.2 g/dL   GFR 94.50 >60.00 mL/min    Comment: Calculated using the CKD-EPI Creatinine Equation (2021)   Calcium 9.1 8.4 - 10.5 mg/dL  Lipid panel     Status: None   Collection Time: 06/08/21  8:38 AM  Result Value Ref Range   Cholesterol 113 0 - 200 mg/dL    Comment: ATP III Classification       Desirable:  < 200 mg/dL               Borderline High:  200 - 239 mg/dL          High:  > = 240 mg/dL   Triglycerides 107.0 0.0 - 149.0 mg/dL    Comment: Normal:  <150 mg/dLBorderline High:  150 - 199 mg/dL   HDL 41.00 >39.00 mg/dL   VLDL 21.4 0.0 - 40.0 mg/dL    LDL Cholesterol 50 0 - 99 mg/dL   Total CHOL/HDL Ratio 3     Comment:                Men          Women1/2 Average Risk     3.4          3.3Average Risk          5.0          4.42X Average Risk          9.6          7.13X Average Risk          15.0          11.0                       NonHDL 71.55     Comment: NOTE:  Non-HDL goal should be 30 mg/dL higher than patient's LDL goal (i.e. LDL goal of < 70 mg/dL, would have non-HDL goal of < 100 mg/dL)  Hemoglobin A1c     Status: None   Collection Time: 06/08/21  8:38 AM  Result Value Ref Range   Hgb A1c MFr Bld 5.7 4.6 - 6.5 %    Comment: Glycemic Control Guidelines for People with Diabetes:Non Diabetic:  <6%Goal of Therapy: <7%Additional Action Suggested:  >8%   HM DIABETES EYE EXAM     Status: None   Collection Time: 06/16/21 12:00 AM  Result Value Ref Range  HM Diabetic Eye Exam No Retinopathy No Retinopathy    Radiology: No results found.  No results found.  No results found.    Assessment and Plan: Patient Active Problem List   Diagnosis Date Noted   Skin rash 06/15/2021   Benign neoplasm of transverse colon    Hypoventilation associated with obesity ([place]) 01/31/2019   Controlled type 2 diabetes mellitus with complication, without long-term current use of insulin ([place]) 08/10/2018   Physical deconditioning 11/15/2016   Hypertension associated with diabetes ([place])    Family history of early CAD 11/28/2014   RBBB 11/28/2014   Pain of right lower leg, chronic 11/28/2014   Personal history of colonic adenoma 09/14/2012   Basal cell cancer 48/18/5631   Diastolic heart failure ([place]) 02/13/2012   Genital warts 02/24/2011   Obstructive sleep apnea 05/12/2009   Hyperlipidemia associated with type 2 diabetes mellitus ([place]) 49/70/2637   METABOLIC SYNDROME X 85/88/5027   Morbid obesity with BMI of 70 and over, adult ([place]) 06/21/2007   Major depressive disorder, recurrent episode, severe ([place]) 06/21/2007      The patient does  tolerate PAP and reports benefit from PAP use. The patient was reminded how to adjust mask fit and advised to change supplies regularly. The patient was also counselled on nightly use. The compliance is excellent. The AHI is 2.2. The patient's machine is past end of life and must be replaced. Will update his sleep studies prior to new machine due to them being 66 years old. Advised to stop current cpap use for 4 nights prior to new PSG.   1. Obstructive sleep apnea continue excellent compliance.  Will order updated PSG and hold cpap use for 4 nights prior to study. Will then have setup for new machine pending results and Follow up 30+ days after set up  2. CPAP use counseling CPAP couseling-Discussed importance of adequate CPAP use as well as proper care and cleaning techniques of machine and all supplies.  3. Essential hypertension Continue current medication and f/u with PCP.  4. Morbid obesity with BMI of 50.0-59.9, adult [facility]) Patient has had bariatric surgery and has lost significant weight over the last year and is working on continuing to lose more. Obesity Counseling: Had a lengthy discussion regarding patients BMI and weight issues. Patient was instructed on portion control as well as increased activity. Also discussed caloric restrictions with trying to maintain intake less than 2000 Kcal. Discussions were made in accordance with the 5As of weight management. Simple actions such as not eating late and if able to, taking a walk is suggested.    General Counseling: I have discussed the findings of the evaluation and examination with Jaylan.  I have also discussed any further diagnostic evaluation thatmay be needed or ordered today. Jachin verbalizes understanding of the findings of todays visit. We also reviewed his medications today and discussed drug interactions and side effects including but not limited excessive drowsiness and altered mental states. We also discussed that there is always a  risk not just to him but also people around him. he has been encouraged to call the office with any questions or concerns that should arise related to todays visit.  No orders of the defined types were placed in this encounter.       I have personally obtained a history, examined the patient, evaluated laboratory and imaging results, formulated the assessment and plan and placed orders.  This patient was seen by Drema Dallas, PA-C in collaboration with Dr. Lanice Schwab  Humphrey Rolls as a part of collaborative care agreement.  Allyne Gee, MD [facility] Diplomate ABMS Pulmonary Critical Care Medicine and Sleep Medicine

## 2021-08-25 ENCOUNTER — Ambulatory Visit (INDEPENDENT_AMBULATORY_CARE_PROVIDER_SITE_OTHER): Payer: Medicare Other | Admitting: Internal Medicine

## 2021-08-25 VITALS — BP 163/84 | HR 91 | Resp 24 | Ht 67.0 in | Wt 377.0 lb

## 2021-08-25 DIAGNOSIS — I10 Essential (primary) hypertension: Secondary | ICD-10-CM | POA: Diagnosis not present

## 2021-08-25 DIAGNOSIS — Z7189 Other specified counseling: Secondary | ICD-10-CM | POA: Diagnosis not present

## 2021-08-25 DIAGNOSIS — G4733 Obstructive sleep apnea (adult) (pediatric): Secondary | ICD-10-CM

## 2021-08-25 DIAGNOSIS — Z6841 Body Mass Index (BMI) 40.0 and over, adult: Secondary | ICD-10-CM

## 2021-08-25 DIAGNOSIS — K912 Postsurgical malabsorption, not elsewhere classified: Secondary | ICD-10-CM | POA: Diagnosis not present

## 2021-08-25 DIAGNOSIS — Z9884 Bariatric surgery status: Secondary | ICD-10-CM | POA: Diagnosis not present

## 2021-08-25 DIAGNOSIS — Z48815 Encounter for surgical aftercare following surgery on the digestive system: Secondary | ICD-10-CM | POA: Diagnosis not present

## 2021-08-25 DIAGNOSIS — Z713 Dietary counseling and surveillance: Secondary | ICD-10-CM | POA: Diagnosis not present

## 2021-08-25 NOTE — Patient Instructions (Signed)

## 2021-09-04 DIAGNOSIS — G4733 Obstructive sleep apnea (adult) (pediatric): Secondary | ICD-10-CM | POA: Diagnosis not present

## 2021-09-20 ENCOUNTER — Other Ambulatory Visit: Payer: Self-pay | Admitting: Family Medicine

## 2021-09-20 DIAGNOSIS — F3342 Major depressive disorder, recurrent, in full remission: Secondary | ICD-10-CM

## 2021-09-28 DIAGNOSIS — Z713 Dietary counseling and surveillance: Secondary | ICD-10-CM | POA: Diagnosis not present

## 2021-09-28 DIAGNOSIS — Z9884 Bariatric surgery status: Secondary | ICD-10-CM | POA: Diagnosis not present

## 2021-09-28 DIAGNOSIS — G4733 Obstructive sleep apnea (adult) (pediatric): Secondary | ICD-10-CM | POA: Diagnosis not present

## 2021-09-28 DIAGNOSIS — K912 Postsurgical malabsorption, not elsewhere classified: Secondary | ICD-10-CM | POA: Diagnosis not present

## 2021-10-12 ENCOUNTER — Encounter: Payer: Self-pay | Admitting: Cardiovascular Disease

## 2021-10-12 ENCOUNTER — Other Ambulatory Visit: Payer: Self-pay

## 2021-10-12 ENCOUNTER — Ambulatory Visit: Payer: Medicare Other | Admitting: Cardiovascular Disease

## 2021-10-12 VITALS — BP 120/78 | HR 77 | Ht 67.0 in | Wt 367.0 lb

## 2021-10-12 DIAGNOSIS — Z9989 Dependence on other enabling machines and devices: Secondary | ICD-10-CM | POA: Diagnosis not present

## 2021-10-12 DIAGNOSIS — I10 Essential (primary) hypertension: Secondary | ICD-10-CM | POA: Diagnosis not present

## 2021-10-12 DIAGNOSIS — E785 Hyperlipidemia, unspecified: Secondary | ICD-10-CM

## 2021-10-12 DIAGNOSIS — I493 Ventricular premature depolarization: Secondary | ICD-10-CM

## 2021-10-12 DIAGNOSIS — G4733 Obstructive sleep apnea (adult) (pediatric): Secondary | ICD-10-CM

## 2021-10-12 NOTE — Progress Notes (Signed)
?  ?Cardiology Office Note ? ? ?Date:  10/12/2021  ? ?ID:  Blake Garcia, DOB [birth year], MRN [id number] ? ?PCP:  Jinny Sanders, MD  ?Cardiologist:   Kathlyn Sacramento, MD  ? ?Chief Complaint  ?Patient presents with  ? Other  ?  6 month f/u no complaints today. Meds reviewed verbally with pt.  ? ? ?  ?History of Present Illness: ?Blake Garcia is a 66 y.o. male who presents for a follow-up visit regarding PVCs and chronic diastolic heart failure.   ?Previous echocardiogram 2016 and 2019 both showed normal LV systolic function.  He was seen in February 2021 for exertional chest pain and fatigue.  He had palpitations.  ZIO monitor showed an average heart rate of 99 bpm with 3 episodes of wide-complex tachycardia longest lasted 15 beats and 4 runs of SVT.  Occasional PVCs with a burden of 2.6%.  Echocardiogram in February of 2021 showed normal LV systolic function with severe left ventricular hypertrophy. ?He was seen by EP and was started on a beta-blocker.   ?He underwent gastric sleeve surgery in March of 2022 but had mild hypotension and thus the surgery was not completed.  He returned back for the second part in June and did well.   ?At his heaviest weight he was close to 550 pounds and he is down to 367 pounds today.  He feels significantly better and started going to the [facility] for exercise.  He was able to push mow the lawn recently.  No palpitations.  His blood pressure was elevated but we increased his amlodipine to 5 mg once daily and since then his blood pressure has been well controlled.  No chest pain or shortness of breath. ? ? ?Past Medical History:  ?Diagnosis Date  ? Basal cell carcinoma [id number]  ? [place]  ? Cellulitis 02/09/2012  ? RLE  ? Complication of anesthesia ~ 2000  ? "during OR for kidney stones; anesthesia RX made me code twice in one week"  ? Depression   ? Diastolic dysfunction   ? a. 11/2014 Echo: EF 50-55%, Gr 1 DD.  ? History of blood transfusion 1958  ? "I was an Rh  baby"  ? History of kidney stones   ? Hyperlipidemia   ? Hypertension   ? Kidney stones   ? Midsternal chest pain   ? Morbid obesity ([place])   ? On home oxygen therapy   ? OSA (obstructive sleep apnea)   ? "wear CPAP"  ? Personal history of colonic adenoma 09/14/2012  ? Pre-diabetes   ? ? ?Past Surgical History:  ?Procedure Laterality Date  ? CHOLECYSTECTOMY  1993  ? COLONOSCOPY    ? COLONOSCOPY WITH PROPOFOL N/A 07/28/2020  ? Procedure: COLONOSCOPY WITH PROPOFOL;  Surgeon: Gatha Mayer, MD;  Location: [facility];  Service: Endoscopy;  Laterality: N/A;  ? ESOPHAGOGASTRODUODENOSCOPY    ? KNEE ARTHROSCOPY  ~ 2004  ? right  ? LAPAROSCOPY    ? Surgical sleeve  ? LITHOTRIPSY  2000  ? POLYPECTOMY  07/28/2020  ? Procedure: POLYPECTOMY;  Surgeon: Gatha Mayer, MD;  Location: [facility];  Service: Endoscopy;;  ? ? ? ?Current Outpatient Medications  ?Medication Sig Dispense Refill  ? amLODipine (NORVASC) 5 MG tablet Take 1 tablet (5 mg total) by mouth daily. 90 tablet 1  ? Calcium Carb-Cholecalciferol (CALCIUM 500 + D PO) Take 3 tablets by mouth in the morning and at bedtime.    ? metoprolol tartrate (LOPRESSOR) 25  MG tablet Take 1 tablet (25 mg total) by mouth every 12 (twelve) hours. 180 tablet 3  ? Multiple Vitamins-Minerals (BARIATRIC MULTIVITAMINS/IRON) CAPS Take 1 capsule by mouth daily.    ? nystatin (MYCOSTATIN/NYSTOP) powder Apply topically 2 (two) times daily as needed.    ? nystatin-triamcinolone (MYCOLOG II) cream Apply 1 application topically 2 (two) times daily. 30 g 0  ? potassium chloride SA (KLOR-CON M) 20 MEQ tablet TAKE ONE AND A HALF TABLETS BY MOUTH EVERY DAY 135 tablet 1  ? simvastatin (ZOCOR) 20 MG tablet TAKE ONE TABLET AT BEDTIME 90 tablet 3  ? triamcinolone (KENALOG) 0.1 % APPLY 1 APPLICATION TOPICALLY 2 (TWO) TIMES DAILY. AS NEEDED 453.6 g 0  ? valsartan (DIOVAN) 320 MG tablet Take 1 tablet (320 mg total) by mouth daily. 90 tablet 3  ? venlafaxine XR (EFFEXOR-XR) 75 MG 24 hr capsule TAKE 3  CAPSULES EVERY DAY 270 capsule 1  ? ?No current facility-administered medications for this visit.  ? ? ?Allergies:   Patient has no known allergies.  ? ? ?Social History:  The patient  reports that he has never smoked. He has never used smokeless tobacco. He reports that he does not drink alcohol and does not use drugs.  ? ?Family History:  The patient's family history includes ADD / ADHD in his daughter, son, and son; Cancer in his maternal aunt and maternal uncle; Coronary artery disease in his brother and brother; Diabetes in his brother; Heart disease in his brother, brother, father, and mother; Hypertension in his mother; Stroke in his father.  ? ? ?ROS:  Please see the history of present illness.   Otherwise, review of systems are positive for none.   All other systems are reviewed and negative.  ? ? ?PHYSICAL EXAM: ?VS:  BP 120/78 (BP Location: Left Arm, Patient Position: Sitting, Cuff Size: Large)   Pulse 77   Ht '5\' 7"'$  (1.702 m)   Wt (!) 367 lb (166.5 kg)   SpO2 97%   BMI 57.48 kg/m?  , BMI Body mass index is 57.48 kg/m?. ?GEN: Well nourished, well developed, in no acute distress  ?HEENT: normal  ?Neck: no JVD, carotid bruits, or masses ?Cardiac: RRR; no murmurs, rubs, or gallops,no edema  ?Respiratory:  clear to auscultation bilaterally, normal work of breathing ?GI: soft, nontender, nondistended, + BS ?MS: no deformity or atrophy  ?Skin: warm and dry, no rash ?Neuro:  Strength and sensation are intact ?Psych: euthymic mood, full affect ? ? ?EKG:  EKG is ordered today. ?The ekg ordered today demonstrates sinus rhythm with 1 PVC and left axis deviation.  Right bundle branch block. ? ?Recent Labs: ?06/08/2021: ALT 15; BUN 13; Creatinine, Ser 0.77; Potassium 3.6; Sodium 139  ? ? ?Lipid Panel ?   ?Component Value Date/Time  ? CHOL 113 06/08/2021 0838  ? TRIG 107.0 06/08/2021 0838  ? TRIG 130 08/10/2006 1022  ? HDL 41.00 06/08/2021 0838  ? CHOLHDL 3 06/08/2021 0838  ? VLDL 21.4 06/08/2021 0838  ? [place] 50  06/08/2021 0838  ? LDLDIRECT 153.9 09/10/2007 1029  ? ?  ? ?Wt Readings from Last 3 Encounters:  ?10/12/21 (!) 367 lb (166.5 kg)  ?08/25/21 (!) 377 lb (171 kg)  ?06/15/21 (!) 396 lb 2 oz (179.7 kg)  ?  ? ? ? ?No flowsheet data found. ? ? ? ?ASSESSMENT AND PLAN: ? ?1.  PVCs and short runs of SVT: Well-controlled with metoprolol. ? ?2.  Essential hypertension: Blood pressure is well controlled on  amlodipine, metoprolol and valsartan. ? ?3.  Hyperlipidemia: I reviewed most recent lipid profile which showed an LDL of 50 and triglyceride of 130.  He continues to take simvastatin 20 mg daily with no side effects. ? ?4. Obstructive sleep apnea on CPAP ? ? ? ?Disposition:   FU with me in 6 months ? ?Signed, ? ?Kathlyn Sacramento, MD  ?10/12/2021 4:17 PM    ?[place] ?

## 2021-10-12 NOTE — Patient Instructions (Signed)

## 2021-10-26 DIAGNOSIS — G4733 Obstructive sleep apnea (adult) (pediatric): Secondary | ICD-10-CM | POA: Diagnosis not present

## 2021-11-09 DIAGNOSIS — H2513 Age-related nuclear cataract, bilateral: Secondary | ICD-10-CM | POA: Diagnosis not present

## 2021-11-09 DIAGNOSIS — H5213 Myopia, bilateral: Secondary | ICD-10-CM | POA: Diagnosis not present

## 2021-11-09 DIAGNOSIS — H52223 Regular astigmatism, bilateral: Secondary | ICD-10-CM | POA: Diagnosis not present

## 2021-11-09 DIAGNOSIS — H524 Presbyopia: Secondary | ICD-10-CM | POA: Diagnosis not present

## 2021-11-09 DIAGNOSIS — E119 Type 2 diabetes mellitus without complications: Secondary | ICD-10-CM | POA: Diagnosis not present

## 2021-11-09 DIAGNOSIS — H35033 Hypertensive retinopathy, bilateral: Secondary | ICD-10-CM | POA: Diagnosis not present

## 2021-11-09 DIAGNOSIS — Z7984 Long term (current) use of oral hypoglycemic drugs: Secondary | ICD-10-CM | POA: Diagnosis not present

## 2021-11-22 ENCOUNTER — Telehealth: Payer: Self-pay | Admitting: Family Medicine

## 2021-11-22 DIAGNOSIS — E118 Type 2 diabetes mellitus with unspecified complications: Secondary | ICD-10-CM

## 2021-11-22 DIAGNOSIS — Z125 Encounter for screening for malignant neoplasm of prostate: Secondary | ICD-10-CM

## 2021-11-22 NOTE — Telephone Encounter (Signed)
-----   Message from Velna Hatchet, RT sent at 11/22/2021  9:34 AM EDT ----- ?Regarding: Lab Wed 12/08/21 ?Patient is scheduled for cpx, please order future labs.  Thanks, Anda Kraft ? ? ?

## 2021-11-26 DIAGNOSIS — G4733 Obstructive sleep apnea (adult) (pediatric): Secondary | ICD-10-CM | POA: Diagnosis not present

## 2021-12-08 ENCOUNTER — Other Ambulatory Visit (INDEPENDENT_AMBULATORY_CARE_PROVIDER_SITE_OTHER): Payer: Medicare Other

## 2021-12-08 DIAGNOSIS — E118 Type 2 diabetes mellitus with unspecified complications: Secondary | ICD-10-CM | POA: Diagnosis not present

## 2021-12-08 DIAGNOSIS — Z125 Encounter for screening for malignant neoplasm of prostate: Secondary | ICD-10-CM

## 2021-12-08 LAB — LIPID PANEL
Cholesterol: 107 mg/dL (ref 0–200)
HDL: 40.4 mg/dL (ref >39.00)
LDL Cholesterol: 50 mg/dL (ref 0–99)
NonHDL: 66.81
Total CHOL/HDL Ratio: 3
Triglycerides: 82 mg/dL (ref 0.0–149.0)
VLDL: 16.4 mg/dL (ref 0.0–40.0)

## 2021-12-08 LAB — COMPREHENSIVE METABOLIC PANEL
ALT: 15 U/L (ref 0–53)
AST: 15 U/L (ref 0–37)
Albumin: 3.8 g/dL (ref 3.5–5.2)
Alkaline Phosphatase: 106 U/L (ref 39–117)
BUN: 13 mg/dL (ref 6–23)
CO2: 24 mEq/L (ref 19–32)
Calcium: 8.7 mg/dL (ref 8.4–10.5)
Chloride: 107 mEq/L (ref 96–112)
Creatinine, Ser: 0.86 mg/dL (ref 0.40–1.50)
GFR: 91.08 mL/min (ref >60.00)
Glucose, Bld: 97 mg/dL (ref 70–99)
Potassium: 3.3 mEq/L — ABNORMAL LOW (ref 3.5–5.1)
Sodium: 140 mEq/L (ref 135–145)
Total Bilirubin: 0.4 mg/dL (ref 0.2–1.2)
Total Protein: 6.8 g/dL (ref 6.0–8.3)

## 2021-12-08 LAB — HEMOGLOBIN A1C: Hgb A1c MFr Bld: 5.3 % (ref 4.6–6.5)

## 2021-12-08 LAB — PSA: PSA: 1.77 ng/mL (ref 0.10–4.00)

## 2021-12-09 NOTE — Progress Notes (Signed)
No critical labs need to be addressed urgently. We will discuss labs in detail at upcoming office visit.   

## 2021-12-17 ENCOUNTER — Ambulatory Visit (INDEPENDENT_AMBULATORY_CARE_PROVIDER_SITE_OTHER): Payer: Medicare Other | Admitting: Family Medicine

## 2021-12-17 ENCOUNTER — Encounter: Payer: Self-pay | Admitting: Family Medicine

## 2021-12-17 VITALS — BP 134/84 | HR 68 | Ht 68.0 in | Wt 354.1 lb

## 2021-12-17 DIAGNOSIS — Z23 Encounter for immunization: Secondary | ICD-10-CM

## 2021-12-17 DIAGNOSIS — E118 Type 2 diabetes mellitus with unspecified complications: Secondary | ICD-10-CM

## 2021-12-17 DIAGNOSIS — Z6841 Body Mass Index (BMI) 40.0 and over, adult: Secondary | ICD-10-CM | POA: Diagnosis not present

## 2021-12-17 DIAGNOSIS — E1169 Type 2 diabetes mellitus with other specified complication: Secondary | ICD-10-CM

## 2021-12-17 DIAGNOSIS — F332 Major depressive disorder, recurrent severe without psychotic features: Secondary | ICD-10-CM

## 2021-12-17 DIAGNOSIS — E785 Hyperlipidemia, unspecified: Secondary | ICD-10-CM | POA: Diagnosis not present

## 2021-12-17 DIAGNOSIS — Z0000 Encounter for general adult medical examination without abnormal findings: Secondary | ICD-10-CM | POA: Diagnosis not present

## 2021-12-17 DIAGNOSIS — F3342 Major depressive disorder, recurrent, in full remission: Secondary | ICD-10-CM

## 2021-12-17 DIAGNOSIS — E1159 Type 2 diabetes mellitus with other circulatory complications: Secondary | ICD-10-CM

## 2021-12-17 DIAGNOSIS — I152 Hypertension secondary to endocrine disorders: Secondary | ICD-10-CM | POA: Diagnosis not present

## 2021-12-17 NOTE — Progress Notes (Signed)
Patient ID: Blake Garcia, male    DOB: [birth year], 67 y.o.   MRN: [id number]  This visit was conducted in person.  BP 134/84   Pulse 68   Ht '5\' 8"'$  (1.727 m)   Wt (!) 354 lb 1.6 oz (160.6 kg)   SpO2 95%   BMI 53.84 kg/m    CC:  Chief Complaint  Patient presents with   medicare visit    Subjective:   HPI: Blake Garcia is a 66 y.o. male presenting on 12/17/2021 for welcome to  medicare visit  The patient presents for welcome to medicare wellness,  and review of chronic health problems. He/She also has the following acute concerns today: none   He has lost 200 lbs with bariatric surgery. Wt Readings from Last 3 Encounters:  12/17/21 (!) 354 lb 1.6 oz (160.6 kg)  10/12/21 (!) 367 lb (166.5 kg)  08/25/21 (!) 377 lb (171 kg)  Body mass index is 53.84 kg/m.  He has been able to mow his yard. Walking with cane.  No longer requiring oxygen.  He is going to the gym, walking.  Diabetes:   resolved s/p bariatric surgery, on no medication. Lab Results  Component Value Date   HGBA1C 5.3 12/08/2021  Using medications without difficulties: Hypoglycemic episodes: Hyperglycemic episodes: Feet problems:none Blood Sugars averaging: eye exam within last year:yes   Elevated Cholesterol:  well controlled LDL at goal on simvastatin 20 mg daily Lab Results  Component Value Date   CHOL 107 12/08/2021   HDL 40.40 12/08/2021   LDLCALC 50 12/08/2021   LDLDIRECT 153.9 09/10/2007   TRIG 82.0 12/08/2021   CHOLHDL 3 12/08/2021  Using medications without problems: none Muscle aches:  Diet compliance:yes Exercise:see above Other complaints:  Hypertension:   Well controlled  amlodipine 5 mg daily, metoprolol 25 mg daily, valsartan 320 daily  BP Readings from Last 3 Encounters:  12/17/21 134/84  10/12/21 120/78  08/25/21 (!) 163/84  Using medication without problems or lightheadedness:  Chest pain with exertion: Edema: Short of breath: Average home BPs: Other  issues:  MDD: well controlled on effexor 75 mg 3 tabs daily.[name] splits dose for absorption. [place] Visit from 12/17/2021 in [place] at [facility] Total Score 0       Relevant past medical, surgical, family and social history reviewed and updated as indicated. Interim medical history since our last visit reviewed. Allergies and medications reviewed and updated. Outpatient Medications Prior to Visit  Medication Sig Dispense Refill   amLODipine (NORVASC) 5 MG tablet Take 1 tablet (5 mg total) by mouth daily. 90 tablet 1   Calcium Carb-Cholecalciferol (CALCIUM 500 + D PO) Take 3 tablets by mouth in the morning and at bedtime.     metoprolol tartrate (LOPRESSOR) 25 MG tablet Take 1 tablet (25 mg total) by mouth every 12 (twelve) hours. 180 tablet 3   Multiple Vitamins-Minerals (BARIATRIC MULTIVITAMINS/IRON) CAPS Take 1 capsule by mouth daily.     nystatin (MYCOSTATIN/NYSTOP) powder Apply topically 2 (two) times daily as needed.     nystatin-triamcinolone (MYCOLOG II) cream Apply 1 application topically 2 (two) times daily. 30 g 0   potassium chloride SA (KLOR-CON M) 20 MEQ tablet TAKE ONE AND A HALF TABLETS BY MOUTH EVERY DAY 135 tablet 1   simvastatin (ZOCOR) 20 MG tablet TAKE ONE TABLET AT BEDTIME 90 tablet 3   triamcinolone (KENALOG) 0.1 % APPLY 1 APPLICATION TOPICALLY 2 (TWO) TIMES DAILY. AS NEEDED 453.6 g  0   valsartan (DIOVAN) 320 MG tablet Take 1 tablet (320 mg total) by mouth daily. 90 tablet 3   venlafaxine XR (EFFEXOR-XR) 75 MG 24 hr capsule TAKE 3 CAPSULES EVERY DAY 270 capsule 1   No facility-administered medications prior to visit.     Per HPI unless specifically indicated in ROS section below Review of Systems  Constitutional:  Negative for fatigue and fever.  HENT:  Negative for ear pain.   Eyes:  Negative for pain.  Respiratory:  Negative for cough and shortness of breath.   Cardiovascular:  Negative for chest pain, palpitations and leg  swelling.  Gastrointestinal:  Negative for abdominal pain.  Genitourinary:  Negative for dysuria.  Musculoskeletal:  Negative for arthralgias.  Neurological:  Negative for syncope, light-headedness and headaches.  Psychiatric/Behavioral:  Negative for dysphoric mood.   Objective:  BP 134/84   Pulse 68   Ht '5\' 8"'$  (1.727 m)   Wt (!) 354 lb 1.6 oz (160.6 kg)   SpO2 95%   BMI 53.84 kg/m   Wt Readings from Last 3 Encounters:  12/17/21 (!) 354 lb 1.6 oz (160.6 kg)  10/12/21 (!) 367 lb (166.5 kg)  08/25/21 (!) 377 lb (171 kg)      Physical Exam Constitutional:      General: He is not in acute distress.    Appearance: Normal appearance. He is well-developed. He is obese. He is not ill-appearing or toxic-appearing.  HENT:     Head: Normocephalic and atraumatic.     Right Ear: Hearing, tympanic membrane, ear canal and external ear normal.     Left Ear: Hearing, tympanic membrane, ear canal and external ear normal.     Nose: Nose normal.     Mouth/Throat:     Pharynx: Uvula midline.  Eyes:     General: Lids are normal. Lids are everted, no foreign bodies appreciated.     Conjunctiva/sclera: Conjunctivae normal.     Pupils: Pupils are equal, round, and reactive to light.  Neck:     Thyroid: No thyroid mass or thyromegaly.     Vascular: No carotid bruit.     Trachea: Trachea and phonation normal.  Cardiovascular:     Rate and Rhythm: Normal rate and regular rhythm.     Pulses: Normal pulses.     Heart sounds: S1 normal and S2 normal. No murmur heard.   No gallop.  Pulmonary:     Breath sounds: Normal breath sounds. No wheezing, rhonchi or rales.  Abdominal:     General: Bowel sounds are normal.     Palpations: Abdomen is soft.     Tenderness: There is no abdominal tenderness. There is no guarding or rebound.     Hernia: No hernia is present.  Musculoskeletal:     Cervical back: Normal range of motion and neck supple.  Lymphadenopathy:     Cervical: No cervical adenopathy.   Skin:    General: Skin is warm and dry.     Findings: No rash.  Neurological:     Mental Status: He is alert.     Cranial Nerves: No cranial nerve deficit.     Sensory: No sensory deficit.     Gait: Gait normal.     Deep Tendon Reflexes: Reflexes are normal and symmetric.  Psychiatric:        Speech: Speech normal.        Behavior: Behavior normal.        Judgment: Judgment normal.  Results for orders placed or performed in visit on 12/08/21  PSA  Result Value Ref Range   PSA 1.77 0.10 - 4.00 ng/mL  Comprehensive metabolic panel  Result Value Ref Range   Sodium 140 135 - 145 mEq/L   Potassium 3.3 (L) 3.5 - 5.1 mEq/L   Chloride 107 96 - 112 mEq/L   CO2 24 19 - 32 mEq/L   Glucose, Bld 97 70 - 99 mg/dL   BUN 13 6 - 23 mg/dL   Creatinine, Ser 0.86 0.40 - 1.50 mg/dL   Total Bilirubin 0.4 0.2 - 1.2 mg/dL   Alkaline Phosphatase 106 39 - 117 U/L   AST 15 0 - 37 U/L   ALT 15 0 - 53 U/L   Total Protein 6.8 6.0 - 8.3 g/dL   Albumin 3.8 3.5 - 5.2 g/dL   GFR 91.08 >60.00 mL/min   Calcium 8.7 8.4 - 10.5 mg/dL  Lipid panel  Result Value Ref Range   Cholesterol 107 0 - 200 mg/dL   Triglycerides 82.0 0.0 - 149.0 mg/dL   HDL 40.40 >39.00 mg/dL   VLDL 16.4 0.0 - 40.0 mg/dL   LDL Cholesterol 50 0 - 99 mg/dL   Total CHOL/HDL Ratio 3    NonHDL 66.81   Hemoglobin A1c  Result Value Ref Range   Hgb A1c MFr Bld 5.3 4.6 - 6.5 %     COVID 19 screen:  No recent travel or known exposure to COVID19 The patient denies respiratory symptoms of COVID 19 at this time. The importance of social distancing was discussed today.   Assessment and Plan   The patient's preventative maintenance and recommended screening tests for an annual wellness exam were reviewed in full today. Brought up to date unless services declined.  Counselled on the importance of diet, exercise, and its role in overall health and mortality. The patient's FH and SH was reviewed, including their home life, tobacco  status, and drug and alcohol status.   Vaccines:  uptodate shingrix, Td 2027, prevnar 20 given today. Has had bivalent booster Prostate Cancer Screen:  Stable, dad with pro cancer.  Lab Results  Component Value Date   PSA 1.77 12/08/2021   PSA 1.51 12/04/2020   PSA 1.40 08/20/2019  Colon Cancer Screen:  colonoscopy  07/2020      Smoking Status:none ETOH/ drug HQI:ONGE/XBMW  Hep C: done  HIV screen:   done  Problem List Items Addressed This Visit     Controlled type 2 diabetes mellitus with complication, without long-term current use of insulin ([place]) (Chronic)    .Chronic, resolved s/p bariatric surgery, on no medication.       Hyperlipidemia associated with type 2 diabetes mellitus (HCC) (Chronic)    Stable, chronic.  Continue current medication.   well controlled LDL at goal on simvastatin 20 mg daily       Hypertension associated with diabetes (HCC) (Chronic)    Stable, chronic.  Continue current medication.   amlodipine 5 mg daily, metoprolol 25 mg daily, valsartan 320 daily      Major depressive disorder, recurrent episode, severe (HCC) (Chronic)   BMI 50.0-59.9, adult (HCC)    Excellent improvement status post bariatric surgery.  He continues to increase his activity and watch his eating habits.  He recently had vitamin and lab evaluation given his bariatric surgery.       Other Visit Diagnoses     Welcome to Medicare preventive visit    -  Primary   MDD (major  depressive disorder), recurrent, in full remission ([place])       Need for pneumococcal vaccine       Relevant Orders   Pneumococcal conjugate vaccine 20-valent (Prevnar 20) (Completed)       Eliezer Lofts, MD

## 2021-12-17 NOTE — Assessment & Plan Note (Signed)
.  Chronic, resolved s/p bariatric surgery, on no medication.

## 2021-12-17 NOTE — Patient Instructions (Addendum)
Keep up the great work!

## 2021-12-17 NOTE — Assessment & Plan Note (Signed)
Stable, chronic.  Continue current medication.   well controlled LDL at goal on simvastatin 20 mg daily

## 2021-12-17 NOTE — Assessment & Plan Note (Signed)
Excellent improvement status post bariatric surgery.  He continues to increase his activity and watch his eating habits.  He recently had vitamin and lab evaluation given his bariatric surgery.

## 2021-12-17 NOTE — Assessment & Plan Note (Signed)
Stable, chronic.  Continue current medication.   amlodipine 5 mg daily, metoprolol 25 mg daily, valsartan 320 daily

## 2021-12-21 ENCOUNTER — Other Ambulatory Visit: Payer: Self-pay | Admitting: Family Medicine

## 2021-12-24 ENCOUNTER — Other Ambulatory Visit: Payer: Self-pay | Admitting: Family Medicine

## 2021-12-26 DIAGNOSIS — G4733 Obstructive sleep apnea (adult) (pediatric): Secondary | ICD-10-CM | POA: Diagnosis not present

## 2022-01-10 ENCOUNTER — Other Ambulatory Visit: Payer: Self-pay

## 2022-01-10 DIAGNOSIS — Z021 Encounter for pre-employment examination: Secondary | ICD-10-CM

## 2022-01-10 NOTE — Progress Notes (Signed)
Presents to [place] [facility] for on-site pre-employment drug screen.  [facility] Acct #:  [id number] [facility] Specimen #:  [id number]  Rapid drug screen results = Negative  AMD

## 2022-01-26 DIAGNOSIS — G4733 Obstructive sleep apnea (adult) (pediatric): Secondary | ICD-10-CM | POA: Diagnosis not present

## 2022-02-02 ENCOUNTER — Other Ambulatory Visit: Payer: Self-pay | Admitting: Cardiovascular Disease

## 2022-02-02 NOTE — Telephone Encounter (Signed)
Rx request sent to pharmacy.  

## 2022-02-03 ENCOUNTER — Ambulatory Visit (INDEPENDENT_AMBULATORY_CARE_PROVIDER_SITE_OTHER): Payer: Medicare Other | Admitting: Family Medicine

## 2022-02-03 ENCOUNTER — Encounter: Payer: Self-pay | Admitting: Family Medicine

## 2022-02-03 VITALS — BP 134/86 | HR 66 | Temp 98.3°F | Ht 68.0 in | Wt 333.1 lb

## 2022-02-03 DIAGNOSIS — Z7689 Persons encountering health services in other specified circumstances: Secondary | ICD-10-CM

## 2022-02-03 NOTE — Progress Notes (Signed)
Patient ID: BLEU MINERD, male    DOB: [birth year], 66 y.o.   MRN: [id number]  This visit was conducted in person.  BP 134/86   Pulse 66   Temp 98.3 F (36.8 C) (Oral)   Ht '5\' 8"'$  (1.727 m)   Wt (!) 333 lb 2 oz (151.1 kg)   SpO2 97%   BMI 50.65 kg/m    CC:  Chief Complaint  Patient presents with   Face to Face Appointment for [employer].    Subjective:   HPI: AMILIO ZEHNDER is a 66 y.o. male presenting on 02/03/2022 for Face to Face Appointment for [employer]. He presents today for mobility assessment for power wheelchair. Morbid obesity .[name] He is continuing to loose weight  following bariatric surgery and now is at  225 lbs weight loss! Wt Readings from Last 3 Encounters:  02/03/22 (!) 333 lb 2 oz (151.1 kg)  12/17/21 (!) 354 lb 1.6 oz (160.6 kg)  10/12/21 (!) 367 lb (166.5 kg)   Body mass index is 50.65 kg/m.  He is off DM medication and no longer has an oxygen requirement.  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  He continues to have  lower extremity weakness it is improved dramatically.  He is walking into the appointment today.  He is NOW ABLE  to do ADLs in the home such as cooking cleaning and toileting without an assistive device. He is able to use a cane or walker given if needed  He has a normal mental capacity and will be able to operate a power wheelchair safely and he has the willingness to use his power wheelchair in his home.  Relevant past medical, surgical, family and social history reviewed and updated as indicated. Interim medical history since our last visit reviewed. Allergies and medications reviewed and updated. Outpatient Medications Prior to Visit  Medication Sig Dispense Refill   amLODipine (NORVASC) 5 MG tablet TAKE 1 TABLET BY MOUTH ONCE DAILY 90 tablet 1   Calcium Carb-Cholecalciferol (CALCIUM 500 + D PO) Take 3 tablets by mouth in the morning and at bedtime.     metoprolol  tartrate (LOPRESSOR) 25 MG tablet Take 1 tablet (25 mg total) by mouth every 12 (twelve) hours. 180 tablet 3   Multiple Vitamins-Minerals (BARIATRIC MULTIVITAMINS/IRON) CAPS Take 1 capsule by mouth daily.     nystatin (MYCOSTATIN/NYSTOP) powder Apply topically 2 (two) times daily as needed.     nystatin-triamcinolone (MYCOLOG II) cream Apply 1 application topically 2 (two) times daily. 30 g 0   potassium chloride SA (KLOR-CON M) 20 MEQ tablet TAKE ONE AND A HALF TABLETS BY MOUTH EVERY DAY 135 tablet 1   simvastatin (ZOCOR) 20 MG tablet TAKE ONE TABLET AT BEDTIME 90 tablet 3   triamcinolone (KENALOG) 0.1 % APPLY 1 APPLICATION TOPICALLY 2 (TWO) TIMES DAILY. AS NEEDED 453.6 g 0   valsartan (DIOVAN) 320 MG tablet TAKE 1 TABLET BY MOUTH ONCE DAILY 90 tablet 3   venlafaxine XR (EFFEXOR-XR) 75 MG 24 hr capsule TAKE 3 CAPSULES EVERY DAY 270 capsule 1   No facility-administered medications prior to visit.     Per HPI unless specifically indicated in ROS section below Review of Systems  Constitutional:  Negative for fatigue.   Objective:  BP 134/86   Pulse 66   Temp 98.3 F (36.8 C) (Oral)   Ht '5\' 8"'$  (1.727 m)   Wt (!) 333 lb 2 oz (151.1  kg)   SpO2 97%   BMI 50.65 kg/m   Wt Readings from Last 3 Encounters:  02/03/22 (!) 333 lb 2 oz (151.1 kg)  12/17/21 (!) 354 lb 1.6 oz (160.6 kg)  10/12/21 (!) 367 lb (166.5 kg)      Physical Exam Constitutional:      Appearance: He is obese.  Neurological:     Mental Status: He is alert.     Cranial Nerves: Cranial nerves 2-12 are intact.     Sensory: Sensation is intact.     Motor: Motor function is intact.     Coordination: Coordination is intact.     Gait: Gait is intact.       Results for orders placed or performed in visit on 12/08/21  PSA  Result Value Ref Range   PSA 1.77 0.10 - 4.00 ng/mL  Comprehensive metabolic panel  Result Value Ref Range   Sodium 140 135 - 145 mEq/L   Potassium 3.3 (L) 3.5 - 5.1 mEq/L   Chloride 107 96 - 112  mEq/L   CO2 24 19 - 32 mEq/L   Glucose, Bld 97 70 - 99 mg/dL   BUN 13 6 - 23 mg/dL   Creatinine, Ser 0.86 0.40 - 1.50 mg/dL   Total Bilirubin 0.4 0.2 - 1.2 mg/dL   Alkaline Phosphatase 106 39 - 117 U/L   AST 15 0 - 37 U/L   ALT 15 0 - 53 U/L   Total Protein 6.8 6.0 - 8.3 g/dL   Albumin 3.8 3.5 - 5.2 g/dL   GFR 91.08 >60.00 mL/min   Calcium 8.7 8.4 - 10.5 mg/dL  Lipid panel  Result Value Ref Range   Cholesterol 107 0 - 200 mg/dL   Triglycerides 82.0 0.0 - 149.0 mg/dL   HDL 40.40 >39.00 mg/dL   VLDL 16.4 0.0 - 40.0 mg/dL   LDL Cholesterol 50 0 - 99 mg/dL   Total CHOL/HDL Ratio 3    NonHDL 66.81   Hemoglobin A1c  Result Value Ref Range   Hgb A1c MFr Bld 5.3 4.6 - 6.5 %     COVID 19 screen:  No recent travel or known exposure to COVID19 The patient denies respiratory symptoms of COVID 19 at this time. The importance of social distancing was discussed today.   Assessment and Plan Problem List Items Addressed This Visit     Encounter for power mobility device assessment - Primary    Following discussion with patient and physical evaluation, it is noted that he is able now to ambulate without the assistance of a power mobility device.  He can use a cane or walker for additional balance as well as he may need a manual wheelchair if needing to be up on his feet for an extended.  Given some continued leg muscle fatigue.  He is agreeable to this and is happy that he has lost such a significant weight and is gradually getting stronger and able to ambulate better.          Eliezer Lofts, MD

## 2022-02-09 DIAGNOSIS — G4733 Obstructive sleep apnea (adult) (pediatric): Secondary | ICD-10-CM | POA: Diagnosis not present

## 2022-02-25 DIAGNOSIS — G4733 Obstructive sleep apnea (adult) (pediatric): Secondary | ICD-10-CM | POA: Diagnosis not present

## 2022-03-09 ENCOUNTER — Ambulatory Visit: Payer: Medicare Other | Admitting: *Deleted

## 2022-03-09 DIAGNOSIS — Z0000 Encounter for general adult medical examination without abnormal findings: Secondary | ICD-10-CM

## 2022-03-09 NOTE — Patient Instructions (Signed)
Blake Garcia , Thank you for taking time to come for your Medicare Wellness Visit. I appreciate your ongoing commitment to your health goals. Please review the following plan we discussed and let me know if I can assist you in the future.    Screening recommendations/referrals: Colonoscopy: up to date Recommended yearly ophthalmology/optometry visit for glaucoma screening and checkup Recommended yearly dental visit for hygiene and checkup  Vaccinations: Influenza vaccine: up to date Pneumococcal vaccine: up to date Tdap vaccine: up to date Shingles vaccine: up to date    Advanced directives: Education provided  Conditions/risks identified:   Next appointment: 06-21-2022 @ 9:00  [place]  Preventive Care 65 Years and Older, Male Preventive care refers to lifestyle choices and visits with your health care provider that can promote health and wellness. What does preventive care include? A yearly physical exam. This is also called an annual well check. Dental exams once or twice a year. Routine eye exams. Ask your health care provider how often you should have your eyes checked. Personal lifestyle choices, including: Daily care of your teeth and gums. Regular physical activity. Eating a healthy diet. Avoiding tobacco and drug use. Limiting alcohol use. Practicing safe sex. Taking low doses of aspirin every day. Taking vitamin and mineral supplements as recommended by your health care provider. What happens during an annual well check? The services and screenings done by your health care provider during your annual well check will depend on your age, overall health, lifestyle risk factors, and family history of disease. Counseling  Your health care provider may ask you questions about your: Alcohol use. Tobacco use. Drug use. Emotional well-being. Home and relationship well-being. Sexual activity. Eating habits. History of falls. Memory and ability to understand  (cognition). Work and work Statistician. Screening  You may have the following tests or measurements: Height, weight, and BMI. Blood pressure. Lipid and cholesterol levels. These may be checked every 5 years, or more frequently if you are over 4 years old. Skin check. Lung cancer screening. You may have this screening every year starting at age 65 if you have a 30-pack-year history of smoking and currently smoke or have quit within the past 15 years. Fecal occult blood test (FOBT) of the stool. You may have this test every year starting at age 65. Flexible sigmoidoscopy or colonoscopy. You may have a sigmoidoscopy every 5 years or a colonoscopy every 10 years starting at age 65. Prostate cancer screening. Recommendations will vary depending on your family history and other risks. Hepatitis C blood test. Hepatitis B blood test. Sexually transmitted disease (STD) testing. Diabetes screening. This is done by checking your blood sugar (glucose) after you have not eaten for a while (fasting). You may have this done every 1-3 years. Abdominal aortic aneurysm (AAA) screening. You may need this if you are a current or former smoker. Osteoporosis. You may be screened starting at age 65 if you are at high risk. Talk with your health care provider about your test results, treatment options, and if necessary, the need for more tests. Vaccines  Your health care provider may recommend certain vaccines, such as: Influenza vaccine. This is recommended every year. Tetanus, diphtheria, and acellular pertussis (Tdap, Td) vaccine. You may need a Td booster every 10 years. Zoster vaccine. You may need this after age 65. Pneumococcal 13-valent conjugate (PCV13) vaccine. One dose is recommended after age 65. Pneumococcal polysaccharide (PPSV23) vaccine. One dose is recommended after age 65. Talk to your health care provider about which  screenings and vaccines you need and how often you need them. This  information is not intended to replace advice given to you by your health care provider. Make sure you discuss any questions you have with your health care provider. Document Released: 08/14/2015 Document Revised: 04/06/2016 Document Reviewed: 05/19/2015 Elsevier Interactive Patient Education  2017 [place] Prevention in the Home Falls can cause injuries. They can happen to people of all ages. There are many things you can do to make your home safe and to help prevent falls. What can I do on the outside of my home? Regularly fix the edges of walkways and driveways and fix any cracks. Remove anything that might make you trip as you walk through a door, such as a raised step or threshold. Trim any bushes or trees on the path to your home. Use bright outdoor lighting. Clear any walking paths of anything that might make someone trip, such as rocks or tools. Regularly check to see if handrails are loose or broken. Make sure that both sides of any steps have handrails. Any raised decks and porches should have guardrails on the edges. Have any leaves, snow, or ice cleared regularly. Use sand or salt on walking paths during winter. Clean up any spills in your garage right away. This includes oil or grease spills. What can I do in the bathroom? Use night lights. Install grab bars by the toilet and in the tub and shower. Do not use towel bars as grab bars. Use non-skid mats or decals in the tub or shower. If you need to sit down in the shower, use a plastic, non-slip stool. Keep the floor dry. Clean up any water that spills on the floor as soon as it happens. Remove soap buildup in the tub or shower regularly. Attach bath mats securely with double-sided non-slip rug tape. Do not have throw rugs and other things on the floor that can make you trip. What can I do in the bedroom? Use night lights. Make sure that you have a light by your bed that is easy to reach. Do not use any sheets or  blankets that are too big for your bed. They should not hang down onto the floor. Have a firm chair that has side arms. You can use this for support while you get dressed. Do not have throw rugs and other things on the floor that can make you trip. What can I do in the kitchen? Clean up any spills right away. Avoid walking on wet floors. Keep items that you use a lot in easy-to-reach places. If you need to reach something above you, use a strong step stool that has a grab bar. Keep electrical cords out of the way. Do not use floor polish or wax that makes floors slippery. If you must use wax, use non-skid floor wax. Do not have throw rugs and other things on the floor that can make you trip. What can I do with my stairs? Do not leave any items on the stairs. Make sure that there are handrails on both sides of the stairs and use them. Fix handrails that are broken or loose. Make sure that handrails are as long as the stairways. Check any carpeting to make sure that it is firmly attached to the stairs. Fix any carpet that is loose or worn. Avoid having throw rugs at the top or bottom of the stairs. If you do have throw rugs, attach them to the floor with carpet  tape. Make sure that you have a light switch at the top of the stairs and the bottom of the stairs. If you do not have them, ask someone to add them for you. What else can I do to help prevent falls? Wear shoes that: Do not have high heels. Have rubber bottoms. Are comfortable and fit you well. Are closed at the toe. Do not wear sandals. If you use a stepladder: Make sure that it is fully opened. Do not climb a closed stepladder. Make sure that both sides of the stepladder are locked into place. Ask someone to hold it for you, if possible. Clearly [place] and make sure that you can see: Any grab bars or handrails. First and last steps. Where the edge of each step is. Use tools that help you move around (mobility aids) if they are  needed. These include: Canes. Walkers. Scooters. Crutches. Turn on the lights when you go into a dark area. Replace any light bulbs as soon as they burn out. Set up your furniture so you have a clear path. Avoid moving your furniture around. If any of your floors are uneven, fix them. If there are any pets around you, be aware of where they are. Review your medicines with your doctor. Some medicines can make you feel dizzy. This can increase your chance of falling. Ask your doctor what other things that you can do to help prevent falls. This information is not intended to replace advice given to you by your health care provider. Make sure you discuss any questions you have with your health care provider. Document Released: 05/14/2009 Document Revised: 12/24/2015 Document Reviewed: 08/22/2014 Elsevier Interactive Patient Education  2017 [employer].

## 2022-03-09 NOTE — Progress Notes (Signed)
Subjective:   Blake Garcia is a 66 y.o. male who presents for an Initial Medicare Annual Wellness Visit.  I connected with  Blake Garcia on 03/09/22 by a telephone enabled telemedicine application and verified that I am speaking with the correct person using two identifiers.   I discussed the limitations of evaluation and management by telemedicine. The patient expressed understanding and agreed to proceed.  Patient location: home  Provider location: Tele-Health-home    Review of Systems           Objective:    There were no vitals filed for this visit. There is no height or weight on file to calculate BMI.     07/28/2020    7:26 AM 10/11/2016    1:12 PM 09/21/2016    1:13 PM 02/09/2012    9:05 PM  Advanced Directives  Does Patient Have a Medical Advance Directive? No   Patient does not have advance directive;Patient would not like information  Would patient like information on creating a medical advance directive? No - Patient declined        Information is confidential and restricted. Go to Review Flowsheets to unlock data.    Current Medications (verified) Outpatient Encounter Medications as of 03/09/2022  Medication Sig   amLODipine (NORVASC) 5 MG tablet TAKE 1 TABLET BY MOUTH ONCE DAILY   Calcium Carb-Cholecalciferol (CALCIUM 500 + D PO) Take 3 tablets by mouth in the morning and at bedtime.   metoprolol tartrate (LOPRESSOR) 25 MG tablet Take 1 tablet (25 mg total) by mouth every 12 (twelve) hours.   Multiple Vitamins-Minerals (BARIATRIC MULTIVITAMINS/IRON) CAPS Take 1 capsule by mouth daily.   nystatin (MYCOSTATIN/NYSTOP) powder Apply topically 2 (two) times daily as needed.   nystatin-triamcinolone (MYCOLOG II) cream Apply 1 application topically 2 (two) times daily.   potassium chloride SA (KLOR-CON M) 20 MEQ tablet TAKE ONE AND A HALF TABLETS BY MOUTH EVERY DAY   simvastatin (ZOCOR) 20 MG tablet TAKE ONE TABLET AT BEDTIME   triamcinolone (KENALOG) 0.1  % APPLY 1 APPLICATION TOPICALLY 2 (TWO) TIMES DAILY. AS NEEDED   valsartan (DIOVAN) 320 MG tablet TAKE 1 TABLET BY MOUTH ONCE DAILY   venlafaxine XR (EFFEXOR-XR) 75 MG 24 hr capsule TAKE 3 CAPSULES EVERY DAY   No facility-administered encounter medications on file as of 03/09/2022.    Allergies (verified) Patient has no known allergies.   History: Past Medical History:  Diagnosis Date   Basal cell carcinoma [id number]   [facility]   Cellulitis 4/49/6759   RLE   Complication of anesthesia ~ 2000   "during OR for kidney stones; anesthesia RX made me code twice in one week"   Depression    Diastolic dysfunction    a. 11/2014 Echo: EF 50-55%, Gr 1 DD.   History of blood transfusion 1958   "I was an Rh baby"   History of kidney stones    Hyperlipidemia    Hypertension    Kidney stones    Midsternal chest pain    Morbid obesity ([place])    On home oxygen therapy    OSA (obstructive sleep apnea)    "wear CPAP"   Personal history of colonic adenoma 09/14/2012   Pre-diabetes    Past Surgical History:  Procedure Laterality Date   CHOLECYSTECTOMY  1993   COLONOSCOPY     COLONOSCOPY WITH PROPOFOL N/A 07/28/2020   Procedure: COLONOSCOPY WITH PROPOFOL;  Surgeon: Gatha Mayer, MD;  Location: [facility]COPY;  Service:  Endoscopy;  Laterality: N/A;   ESOPHAGOGASTRODUODENOSCOPY     KNEE ARTHROSCOPY  ~ 2004   right   LAPAROSCOPY     Surgical sleeve   LITHOTRIPSY  2000   POLYPECTOMY  07/28/2020   Procedure: POLYPECTOMY;  Surgeon: Gatha Mayer, MD;  Location: [facility];  Service: Endoscopy;;   Family History  Problem Relation Age of Onset   Hypertension Mother    Heart disease Mother    Heart disease Father    Stroke Father    Heart disease Brother    Coronary artery disease Brother    Diabetes Brother    Cancer Maternal Aunt        breast   Cancer Maternal Uncle        lung   Heart disease Brother    Coronary artery disease Brother    ADD / ADHD Son    ADD / ADHD  Son    ADD / ADHD Daughter    Social History   Socioeconomic History   Marital status: Divorced    Spouse name: Not on file   Number of children: 3   Years of education: Not on file   Highest education level: Some college, no degree  Occupational History   Occupation: not employed  Tobacco Use   Smoking status: Never   Smokeless tobacco: Never  Vaping Use   Vaping Use: Never used  Substance and Sexual Activity   Alcohol use: No    Alcohol/week: 0.0 standard drinks of alcohol    Comment: 02/09/12 "may have driink at party once/year"   Drug use: No   Sexual activity: Not Currently  Other Topics Concern   Not on file  Social History Narrative   Uses a scooter for ambulation   He is disabled   He is divorced and has 3 children   No alcohol never smoker no drug use no current tobacco   Social Determinants of Health   Financial Resource Strain: Low Risk  (06/05/2017)   Overall Financial Resource Strain (CARDIA)    Difficulty of Paying Living Expenses: Not hard at all  Food Insecurity: No Food Insecurity (06/05/2017)   Hunger Vital Sign    Worried About Running Out of Food in the Last Year: Never true    [place] in the Last Year: Never true  Transportation Needs: No Transportation Needs (06/05/2017)   PRAPARE - Hydrologist (Medical): No    Lack of Transportation (Non-Medical): No  Physical Activity: Inactive (06/05/2017)   Exercise Vital Sign    Days of Exercise per Week: 0 days    Minutes of Exercise per Session: 0 min  Stress: No Stress Concern Present (06/05/2017)   [place]    Feeling of Stress : Only a little  Social Connections: Moderately Integrated (06/05/2017)   Social Connection and Isolation Panel [NHANES]    Frequency of Communication with Friends and Family: More than three times a week    Frequency of Social Gatherings with Friends and Family: More than three  times a week    Attends Religious Services: More than 4 times per year    Active Member of [employer] or Organizations: Yes    Attends Music therapist: More than 4 times per year    Marital Status: Divorced    Tobacco Counseling Counseling given: Not Answered   Clinical Intake:  Diabetic?  no         Activities of Daily Living    12/17/2021    9:38 AM  In your present state of health, do you have any difficulty performing the following activities:  Hearing? 0  Vision? 0  Difficulty concentrating or making decisions? 0  Walking or climbing stairs? 0  Dressing or bathing? 0  Doing errands, shopping? 0  Preparing Food and eating ? N  Using the Toilet? N  In the past six months, have you accidently leaked urine? N  Do you have problems with loss of bowel control? N  Managing your Medications? N  Managing your Finances? N  Housekeeping or managing your Housekeeping? N    Patient Care Team: Jinny Sanders, MD as PCP - General Wellington Hampshire, MD as PCP - Cardiology (Cardiology)  Indicate any recent Medical Services you may have received from other than [facility] providers in the past year (date may be approximate).     Assessment:   This is a routine wellness examination for [employer].  Hearing/Vision screen No results found.  Dietary issues and exercise activities discussed:     Goals Addressed   None    Depression Screen    12/17/2021    9:41 AM 06/15/2021    9:29 AM 12/10/2020    4:11 PM 07/10/2020   12:30 PM 08/23/2019    2:12 PM 05/21/2019    8:23 AM 08/16/2016   10:04 AM  PHQ 2/9 Scores  PHQ - 2 Score 0 0 '2 2 3 2 4  '$ PHQ- 9 Score  '2 7 8 13 5 15    '$ Fall Risk    12/17/2021    9:42 AM  Fall Risk   Falls in the past year? 0  Follow up Falls evaluation completed    FALL RISK PREVENTION PERTAINING TO THE HOME:  Any stairs in or around the home? No  If so, are there any without handrails? No  Home free of loose throw  rugs in walkways, pet beds, electrical cords, etc? Yes  Adequate lighting in your home to reduce risk of falls? Yes   ASSISTIVE DEVICES UTILIZED TO PREVENT FALLS:  Life alert? No  Use of a cane, walker or w/c? Yes  Grab bars in the bathroom? Yes  Shower chair or bench in shower? Yes  Elevated toilet seat or a handicapped toilet? Yes   TIMED UP AND GO:  Was the test performed? No .    Cognitive Function:        12/17/2021    9:40 AM  6CIT Screen  What Year? 0 points  What month? 0 points  What time? 0 points  Count back from 20 0 points  Months in reverse 0 points  Repeat phrase 2 points  Total Score 2 points    Immunizations Immunization History  Administered Date(s) Administered   Influenza Whole 06/01/2004, 05/02/2007, 03/24/2009   Influenza,inj,Quad PF,6+ Mos 10/21/2015, 08/16/2016, 08/10/2018, 08/23/2019, 04/16/2020, 06/15/2021   Influenza-Unspecified 07/01/2013   PFIZER(Purple Top)SARS-COV-2 Vaccination 10/22/2019, 11/12/2019, 07/01/2020   PNEUMOCOCCAL CONJUGATE-20 12/17/2021   Pfizer Covid-19 Vaccine Bivalent Booster 28yr & up 04/23/2021   Pneumococcal Polysaccharide-23 08/10/2018   Td 08/02/2003   Tdap 01/04/2016   Zoster Recombinat (Shingrix) 11/09/2016, 08/23/2019    TDAP status: Up to date  Flu Vaccine status: Up to date  Pneumococcal vaccine status: Up to date  Covid-19 vaccine status: Information provided on how to obtain vaccines.   Qualifies for Shingles Vaccine? No  Zostavax completed No   Shingrix Completed?: Yes  Screening Tests Health Maintenance  Topic Date Due   INFLUENZA VACCINE  03/01/2022   FOOT EXAM  06/07/2022   HEMOGLOBIN A1C  06/10/2022   OPHTHALMOLOGY EXAM  06/16/2022   TETANUS/TDAP  01/03/2026   COLONOSCOPY (Pts 45-29yr Insurance coverage will need to be confirmed)  07/29/2027   Pneumonia Vaccine 65 Years old  Completed   COVID-19 Vaccine  Completed   Hepatitis C Screening  Completed   HIV Screening  Completed    Zoster Vaccines- Shingrix  Completed   HPV VACCINES  Aged Out    Health Maintenance  Health Maintenance Due  Topic Date Due   INFLUENZA VACCINE  03/01/2022    Colorectal cancer screening: Type of screening: Colonoscopy. Completed 2021. Repeat every 10 years  Lung Cancer Screening: (Low Dose CT Chest recommended if Age 65-80years, 30 pack-year currently smoking OR have quit w/in 15years.) does not qualify.   Lung Cancer Screening Referral:   Additional Screening:  Hepatitis C Screening: does not qualify; Completed 2017  Vision Screening: Recommended annual ophthalmology exams for early detection of glaucoma and other disorders of the eye. Is the patient up to date with their annual eye exam?  Yes  Who is the provider or what is the name of the office in which the patient attends annual eye exams? nice If pt is not established with a provider, would they like to be referred to a provider to establish care? No .   Dental Screening: Recommended annual dental exams for proper oral hygiene  Community Resource Referral / Chronic Care Management: CRR required this visit?  No   CCM required this visit?  No      Plan:     I have personally reviewed and noted the following in the patient's chart:   Medical and social history Use of alcohol, tobacco or illicit drugs  Current medications and supplements including opioid prescriptions. Patient is not currently taking opioid prescriptions. Functional ability and status Nutritional status Physical activity Advanced directives List of other physicians Hospitalizations, surgeries, and ER visits in previous 12 months Vitals Screenings to include cognitive, depression, and falls Referrals and appointments  In addition, I have reviewed and discussed with patient certain preventive protocols, quality metrics, and best practice recommendations. A written personalized care plan for preventive services as well as general preventive  health recommendations were provided to patient.     JLeroy Kennedy LPN   80/0/1749  Nurse Notes:

## 2022-03-13 DIAGNOSIS — Z7689 Persons encountering health services in other specified circumstances: Secondary | ICD-10-CM | POA: Insufficient documentation

## 2022-03-13 NOTE — Assessment & Plan Note (Signed)
Following discussion with patient and physical evaluation, it is noted that he is able now to ambulate without the assistance of a power mobility device.  He can use a cane or walker for additional balance as well as he may need a manual wheelchair if needing to be up on his feet for an extended.  Given some continued leg muscle fatigue.  He is agreeable to this and is happy that he has lost such a significant weight and is gradually getting stronger and able to ambulate better.

## 2022-03-15 NOTE — Addendum Note (Signed)
Addended by: Suszanne Finch on: 03/15/2022 01:45 PM   Modules accepted: Level of Service

## 2022-03-15 NOTE — Progress Notes (Signed)
I connected with  Blake Garcia visit on  by a  enabled telemedicine application and verified that I am speaking with the correct person using two identifiers. 03-09-2022   I discussed the limitations of evaluation and management by telemedicine. The patient expressed understanding and agreed to proceed.  Patient location: home  Provider location: Tele-Health-home  Second note     Subjective:   Blake Garcia is a 66 y.o. male who presents for Medicare Annual/Subsequent preventive examination.  Review of Systems     Cardiac Risk Factors include: advanced age (>58mn, >>52women);male gender;family history of premature cardiovascular disease;obesity (BMI >30kg/m2);hypertension     Objective:    Today's Vitals   There is no height or weight on file to calculate BMI.     03/09/2022    8:34 AM 07/28/2020    7:26 AM 10/11/2016    1:12 PM 09/21/2016    1:13 PM 02/09/2012    9:05 PM  Advanced Directives  Does Patient Have a Medical Advance Directive? No No   Patient does not have advance directive;Patient would not like information  Would patient like information on creating a medical advance directive? No - Patient declined No - Patient declined        Information is confidential and restricted. Go to Review Flowsheets to unlock data.    Current Medications (verified) Outpatient Encounter Medications as of 03/09/2022  Medication Sig   amLODipine (NORVASC) 5 MG tablet TAKE 1 TABLET BY MOUTH ONCE DAILY   Calcium Carb-Cholecalciferol (CALCIUM 500 + D PO) Take 3 tablets by mouth in the morning and at bedtime.   metoprolol tartrate (LOPRESSOR) 25 MG tablet Take 1 tablet (25 mg total) by mouth every 12 (twelve) hours.   Multiple Vitamins-Minerals (BARIATRIC MULTIVITAMINS/IRON) CAPS Take 1 capsule by mouth daily.   nystatin (MYCOSTATIN/NYSTOP) powder Apply topically 2 (two) times daily as needed.   nystatin-triamcinolone (MYCOLOG II) cream Apply 1 application topically 2 (two) times  daily.   potassium chloride SA (KLOR-CON M) 20 MEQ tablet TAKE ONE AND A HALF TABLETS BY MOUTH EVERY DAY   simvastatin (ZOCOR) 20 MG tablet TAKE ONE TABLET AT BEDTIME   triamcinolone (KENALOG) 0.1 % APPLY 1 APPLICATION TOPICALLY 2 (TWO) TIMES DAILY. AS NEEDED   valsartan (DIOVAN) 320 MG tablet TAKE 1 TABLET BY MOUTH ONCE DAILY   venlafaxine XR (EFFEXOR-XR) 75 MG 24 hr capsule TAKE 3 CAPSULES EVERY DAY   No facility-administered encounter medications on file as of 03/09/2022.    Allergies (verified) Patient has no known allergies.   History: Past Medical History:  Diagnosis Date   Basal cell carcinoma [id number]  [facility]   Cellulitis 74/85/4627  RLE   Complication of anesthesia ~ 2000   "during OR for kidney stones; anesthesia RX made me code twice in one week"   Depression    Diastolic dysfunction    a. 11/2014 Echo: EF 50-55%, Gr 1 DD.   History of blood transfusion 1958   "I was an Rh baby"   History of kidney stones    Hyperlipidemia    Hypertension    Kidney stones    Midsternal chest pain    Morbid obesity ([place]    On home oxygen therapy    OSA (obstructive sleep apnea)    "wear CPAP"   Personal history of colonic adenoma 09/14/2012   Pre-diabetes    Past Surgical History:  Procedure Laterality Date   CHOLECYSTECTOMY  1993   COLONOSCOPY  COLONOSCOPY WITH PROPOFOL N/A 07/28/2020   Procedure: COLONOSCOPY WITH PROPOFOL;  Surgeon: Gatha Mayer, MD;  Location: [facility];  Service: Endoscopy;  Laterality: N/A;   ESOPHAGOGASTRODUODENOSCOPY     KNEE ARTHROSCOPY  ~ 2004   right   LAPAROSCOPY     Surgical sleeve   LITHOTRIPSY  2000   POLYPECTOMY  07/28/2020   Procedure: POLYPECTOMY;  Surgeon: Gatha Mayer, MD;  Location: [facility];  Service: Endoscopy;;   Family History  Problem Relation Age of Onset   Hypertension Mother    Heart disease Mother    Heart disease Father    Stroke Father    Heart disease Brother    Coronary artery disease  Brother    Diabetes Brother    Cancer Maternal Aunt        breast   Cancer Maternal Uncle        lung   Heart disease Brother    Coronary artery disease Brother    ADD / ADHD Son    ADD / ADHD Son    ADD / ADHD Daughter    Social History   Socioeconomic History   Marital status: Divorced    Spouse name: Not on file   Number of children: 3   Years of education: Not on file   Highest education level: Some college, no degree  Occupational History   Occupation: not employed  Tobacco Use   Smoking status: Never   Smokeless tobacco: Never  Vaping Use   Vaping Use: Never used  Substance and Sexual Activity   Alcohol use: No    Alcohol/week: 0.0 standard drinks of alcohol    Comment: 02/09/12 "may have driink at party once/year"   Drug use: No   Sexual activity: Not Currently  Other Topics Concern   Not on file  Social History Narrative   Uses a scooter for ambulation   He is disabled   He is divorced and has 3 children   No alcohol never smoker no drug use no current tobacco   Social Determinants of Health   Financial Resource Strain: Low Risk  (03/09/2022)   Overall Financial Resource Strain (CARDIA)    Difficulty of Paying Living Expenses: Not hard at all  Food Insecurity: No Food Insecurity (03/09/2022)   Hunger Vital Sign    Worried About Running Out of Food in the Last Year: Never true    [place] in the Last Year: Never true  Transportation Needs: No Transportation Needs (03/09/2022)   PRAPARE - Hydrologist (Medical): No    Lack of Transportation (Non-Medical): No  Physical Activity: Sufficiently Active (03/09/2022)   Exercise Vital Sign    Days of Exercise per Week: 4 days    Minutes of Exercise per Session: 60 min  Stress: No Stress Concern Present (03/09/2022)   [place]    Feeling of Stress : Not at all  Social Connections: Moderately Integrated (03/09/2022)    Social Connection and Isolation Panel [NHANES]    Frequency of Communication with Friends and Family: More than three times a week    Frequency of Social Gatherings with Friends and Family: Once a week    Attends Religious Services: More than 4 times per year    Active Member of [employer] or Organizations: Yes    Attends Music therapist: More than 4 times per year    Marital Status: Divorced  Tobacco Counseling Counseling given: Not Answered   Clinical Intake:  Pre-visit preparation completed: Yes  Pain : No/denies pain     Nutritional Risks: None Diabetes: No  How often do you need to have someone help you when you read instructions, pamphlets, or other written materials from your doctor or pharmacy?: 1 - Never  Diabetic?  no  Interpreter Needed?: No  Information entered by :: Leroy Kennedy LPN   Activities of Daily Living    03/09/2022    8:44 AM 12/17/2021    9:38 AM  In your present state of health, do you have any difficulty performing the following activities:  Hearing? 0 0  Vision? 0 0  Difficulty concentrating or making decisions? 0 0  Walking or climbing stairs? 0 0  Dressing or bathing? 0 0  Doing errands, shopping? 0 0  Preparing Food and eating ? N N  Using the Toilet? N N  In the past six months, have you accidently leaked urine? N N  Do you have problems with loss of bowel control? Y N  Managing your Medications? N N  Managing your Finances? N N  Housekeeping or managing your Housekeeping? N N    Patient Care Team: Jinny Sanders, MD as PCP - General Wellington Hampshire, MD as PCP - Cardiology (Cardiology)  Indicate any recent Medical Services you may have received from other than [facility] providers in the past year (date may be approximate).     Assessment:   This is a routine wellness examination for [employer].  Hearing/Vision screen Hearing Screening - Comments:: No trouble hearing Vision Screening - Comments:: Up to date [place]  Dietary  issues and exercise activities discussed:     Goals Addressed             This Visit's Progress    Patient Stated       Continue with weight loss        Depression Screen    03/09/2022    8:44 AM 12/17/2021    9:41 AM 06/15/2021    9:29 AM 12/10/2020    4:11 PM 07/10/2020   12:30 PM 08/23/2019    2:12 PM 05/21/2019    8:23 AM  PHQ 2/9 Scores  PHQ - 2 Score 0 0 0 '2 2 3 2  '$ PHQ- 9 Score   '2 7 8 13 5    '$ Fall Risk    03/15/2022    1:41 PM 12/17/2021    9:42 AM  [place] in the past year? 0 0  Number falls in past yr: 0   Injury with Fall? 0   Follow up Falls evaluation completed;Education provided;Falls prevention discussed Falls evaluation completed    FALL RISK PREVENTION PERTAINING TO THE HOME:  Any stairs in or around the home? No  If so, are there any without handrails? No  Home free of loose throw rugs in walkways, pet beds, electrical cords, etc? Yes  Adequate lighting in your home to reduce risk of falls? Yes   ASSISTIVE DEVICES UTILIZED TO PREVENT FALLS:  Life alert? No  Use of a cane, walker or w/c? Yes  Grab bars in the bathroom? Yes  Shower chair or bench in shower? Yes  Elevated toilet seat or a handicapped toilet? Yes   TIMED UP AND GO:  Was the test performed? No .    Cognitive Function:        03/09/2022    8:41 AM 12/17/2021  9:40 AM  6CIT Screen  What Year? 0 points 0 points  What month? 0 points 0 points  What time? 0 points 0 points  Count back from 20 0 points 0 points  Months in reverse 0 points 0 points  Repeat phrase 0 points 2 points  Total Score 0 points 2 points    Immunizations Immunization History  Administered Date(s) Administered   Influenza Whole 06/01/2004, 05/02/2007, 03/24/2009   Influenza,inj,Quad PF,6+ Mos 10/21/2015, 08/16/2016, 08/10/2018, 08/23/2019, 04/16/2020, 06/15/2021   Influenza-Unspecified 07/01/2013   PFIZER(Purple Top)SARS-COV-2 Vaccination 10/22/2019, 11/12/2019, 07/01/2020    PNEUMOCOCCAL CONJUGATE-20 12/17/2021   Pfizer Covid-19 Vaccine Bivalent Booster 41yr & up 04/23/2021   Pneumococcal Polysaccharide-23 08/10/2018   Td 08/02/2003   Tdap 01/04/2016   Zoster Recombinat (Shingrix) 11/09/2016, 08/23/2019    TDAP status: Up to date  Flu Vaccine status: Up to date  Pneumococcal vaccine status: Up to date  Covid-19 vaccine status: Information provided on how to obtain vaccines.   Qualifies for Shingles Vaccine? No   Zostavax completed No   Shingrix Completed?: Yes  Screening Tests Health Maintenance  Topic Date Due   COVID-19 Vaccine (5 - Pfizer risk series) 06/18/2021   INFLUENZA VACCINE  03/01/2022   FOOT EXAM  06/07/2022   HEMOGLOBIN A1C  06/10/2022   OPHTHALMOLOGY EXAM  06/16/2022   TETANUS/TDAP  01/03/2026   COLONOSCOPY (Pts 45-437yrInsurance coverage will need to be confirmed)  07/29/2027   Pneumonia Vaccine 6520Years old  Completed   Hepatitis C Screening  Completed   HIV Screening  Completed   Zoster Vaccines- Shingrix  Completed   HPV VACCINES  Aged Out    Health Maintenance  Health Maintenance Due  Topic Date Due   COVID-19 Vaccine (5 - Pfizer risk series) 06/18/2021   INFLUENZA VACCINE  03/01/2022    Colorectal cancer screening: Type of screening: Colonoscopy. Completed 2023. Repeat every 1 years  Lung Cancer Screening: (Low Dose CT Chest recommended if Age 65-80ears, 30 pack-year currently smoking OR have quit w/in 15years.) does not qualify.   Lung Cancer Screening Referral:   Additional Screening:  Hepatitis C Screening: does not qualify; Completed 2017  Vision Screening: Recommended annual ophthalmology exams for early detection of glaucoma and other disorders of the eye. Is the patient up to date with their annual eye exam?  Yes  Who is the provider or what is the name of the office in which the patient attends annual eye exams? [facility] If pt is not established with a provider, would they like to be referred to a  provider to establish care? No .   Dental Screening: Recommended annual dental exams for proper oral hygiene  Community Resource Referral / Chronic Care Management: CRR required this visit?  No   CCM required this visit?  No      Plan:     I have personally reviewed and noted the following in the patient's chart:   Medical and social history Use of alcohol, tobacco or illicit drugs  Current medications and supplements including opioid prescriptions. Patient is not currently taking opioid prescriptions. Functional ability and status Nutritional status Physical activity Advanced directives List of other physicians Hospitalizations, surgeries, and ER visits in previous 12 months Vitals Screenings to include cognitive, depression, and falls Referrals and appointments  In addition, I have reviewed and discussed with patient certain preventive protocols, quality metrics, and best practice recommendations. A written personalized care plan for preventive services as well as general preventive health recommendations were  provided to patient.     Leroy Kennedy, LPN   09/15/8725   Nurse Notes:

## 2022-03-17 ENCOUNTER — Telehealth: Payer: Self-pay | Admitting: Family Medicine

## 2022-03-17 NOTE — Telephone Encounter (Signed)
Left message for patient to call back and schedule Medicare Annual Wellness Visit (AWV).   Please offer to do virtually or by telephone.   AWVI Eligible as of 12/30/2021  Please schedule at anytime with [facility] schedule   45 minute appointent  If any questions, please contact me at [phone]

## 2022-03-22 ENCOUNTER — Other Ambulatory Visit: Payer: Self-pay | Admitting: Family Medicine

## 2022-03-22 ENCOUNTER — Other Ambulatory Visit: Payer: Self-pay | Admitting: Cardiovascular Disease

## 2022-03-28 DIAGNOSIS — G4733 Obstructive sleep apnea (adult) (pediatric): Secondary | ICD-10-CM | POA: Diagnosis not present

## 2022-04-10 ENCOUNTER — Other Ambulatory Visit: Payer: Self-pay | Admitting: Family Medicine

## 2022-04-10 DIAGNOSIS — F3342 Major depressive disorder, recurrent, in full remission: Secondary | ICD-10-CM

## 2022-04-19 ENCOUNTER — Encounter: Payer: Self-pay | Admitting: Cardiovascular Disease

## 2022-04-19 ENCOUNTER — Ambulatory Visit: Payer: Medicare Other | Attending: Cardiovascular Disease | Admitting: Cardiovascular Disease

## 2022-04-19 VITALS — BP 120/70 | HR 64 | Ht 67.0 in | Wt 320.1 lb

## 2022-04-19 DIAGNOSIS — I10 Essential (primary) hypertension: Secondary | ICD-10-CM | POA: Diagnosis not present

## 2022-04-19 DIAGNOSIS — Z9989 Dependence on other enabling machines and devices: Secondary | ICD-10-CM

## 2022-04-19 DIAGNOSIS — I493 Ventricular premature depolarization: Secondary | ICD-10-CM | POA: Diagnosis not present

## 2022-04-19 DIAGNOSIS — E785 Hyperlipidemia, unspecified: Secondary | ICD-10-CM

## 2022-04-19 DIAGNOSIS — G4733 Obstructive sleep apnea (adult) (pediatric): Secondary | ICD-10-CM

## 2022-04-19 NOTE — Patient Instructions (Signed)
Medication Instructions:  Your physician recommends that you continue on your current medications as directed. Please refer to the Current Medication list given to you today.  *If you need a refill on your cardiac medications before your next appointment, please call your pharmacy*   Lab Work: None ordered If you have labs (blood work) drawn today and your tests are completely normal, you will receive your results only by: MyChart Message (if you have MyChart) OR A paper copy in the mail If you have any lab test that is abnormal or we need to change your treatment, we will call you to review the results.   Testing/Procedures: None ordered   Follow-Up: At [facility], you and your health needs are our priority.  As part of our continuing mission to provide you with exceptional heart care, we have created designated Provider Care Teams.  These Care Teams include your primary Cardiologist (physician) and Advanced Practice Providers (APPs -  Physician Assistants and Nurse Practitioners) who all work together to provide you with the care you need, when you need it.  We recommend signing up for the patient portal called "MyChart".  Sign up information is provided on this After Visit Summary.  MyChart is used to connect with patients for Virtual Visits (Telemedicine).  Patients are able to view lab/test results, encounter notes, upcoming appointments, etc.  Non-urgent messages can be sent to your provider as well.   To learn more about what you can do with MyChart, go to [email or web address].    Your next appointment:   Your physician wants you to follow-up in: 1 year You will receive a reminder letter in the mail two months in advance. If you don't receive a letter, please call our office to schedule the follow-up appointment.   The format for your next appointment:   In Person  Provider:   You may see Muhammad Arida, MD or one of the following Advanced Practice Providers on  your designated Care Team:   Christopher Berge, NP Ryan Dunn, PA-C Cadence Furth, PA-C Sheri Hammock, NP    Other Instructions N/A  Important Information About Sugar       

## 2022-04-19 NOTE — Progress Notes (Signed)
Cardiology Office Note   Date:  04/19/2022   ID:  Blake Garcia, DOB 1956-03-27, MRN [id number]  PCP:  Jinny Sanders, MD  Cardiologist:   Kathlyn Sacramento, MD   Chief Complaint  Patient presents with   OTHER    6 month f/u no complaints today. Meds reviewed verbally with pt.      History of Present Illness: Blake Garcia is a 66 y.o. male who presents for a follow-up visit regarding PVCs and chronic diastolic heart failure.   Previous echocardiogram 2016 and 2019 both showed normal LV systolic function.  He was seen in February 2021 for exertional chest pain and fatigue.  He had palpitations.  ZIO monitor showed an average heart rate of 99 bpm with 3 episodes of wide-complex tachycardia longest lasted 15 beats and 4 runs of SVT.  Occasional PVCs with a burden of 2.6%.  Echocardiogram in February of 2021 showed normal LV systolic function with severe left ventricular hypertrophy. He was seen by EP and was started on a beta-blocker.   He underwent gastric sleeve surgery in March of 2022 but had mild hypotension and thus the surgery was not completed.  He returned back for the second part in June and did well.   At his heaviest weight he was close to 550 pounds and he is down to 367 pounds today.  He feels significantly better and started going to the [facility] for exercise.  He was able to push mow the lawn recently.    He has been doing well with no chest pain, shortness of breath or palpitations.  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   Past Medical History:  Diagnosis Date   Basal cell carcinoma [id number]   [facility]   Cellulitis 2/72/5366   RLE   Complication of anesthesia ~ 2000   "during OR for kidney stones; anesthesia RX made me code twice in one week"   Depression    Diastolic dysfunction    a. 11/2014 Echo: EF 50-55%, Gr 1 DD.   History of blood transfusion 1958   "I was an Rh baby"   History of kidney stones     Hyperlipidemia    Hypertension    Kidney stones    Midsternal chest pain    Morbid obesity ([place])    On home oxygen therapy    OSA (obstructive sleep apnea)    "wear CPAP"   Personal history of colonic adenoma 09/14/2012   Pre-diabetes     Past Surgical History:  Procedure Laterality Date   CHOLECYSTECTOMY  1993   COLONOSCOPY     COLONOSCOPY WITH PROPOFOL N/A 07/28/2020   Procedure: COLONOSCOPY WITH PROPOFOL;  Surgeon: Gatha Mayer, MD;  [facility]: [facility];  Service: Endoscopy;  Laterality: N/A;   ESOPHAGOGASTRODUODENOSCOPY     KNEE ARTHROSCOPY  ~ 2004   right   LAPAROSCOPY     Surgical sleeve   LITHOTRIPSY  2000   POLYPECTOMY  07/28/2020   Procedure: POLYPECTOMY;  Surgeon: Gatha Mayer, MD;  [facility]: [facility];  Service: Endoscopy;;     Current Outpatient Medications  Medication Sig Dispense Refill   amLODipine (NORVASC) 5 MG tablet TAKE 1 TABLET BY MOUTH ONCE DAILY 90 tablet 1   Calcium Carb-Cholecalciferol (CALCIUM 500 + D PO) Take 3 tablets by mouth in the morning and at bedtime.     metoprolol tartrate (LOPRESSOR) 25 MG tablet TAKE 1 TABLET BY MOUTH  EVERY 12 HOURS 180 tablet 3   Multiple Vitamins-Minerals (BARIATRIC MULTIVITAMINS/IRON) CAPS Take 1 capsule by mouth daily.     nystatin (MYCOSTATIN/NYSTOP) powder Apply topically 2 (two) times daily as needed.     nystatin-triamcinolone (MYCOLOG II) cream Apply 1 application topically 2 (two) times daily. 30 g 0   potassium chloride SA (KLOR-CON M) 20 MEQ tablet TAKE ONE AND A HALF TABLETS BY MOUTH EVERY DAY 135 tablet 1   simvastatin (ZOCOR) 20 MG tablet TAKE ONE TABLET AT BEDTIME 90 tablet 3   triamcinolone (KENALOG) 0.1 % APPLY 1 APPLICATION TOPICALLY 2 (TWO) TIMES DAILY. AS NEEDED 453.6 g 0   valsartan (DIOVAN) 320 MG tablet TAKE 1 TABLET BY MOUTH ONCE DAILY 90 tablet 3   venlafaxine XR (EFFEXOR-XR) 75 MG 24 hr capsule TAKE THREE CAPSULES DAILY 270 capsule 1   No current facility-administered medications  for this visit.    Allergies:   Patient has no known allergies.    Social History:  The patient  reports that he has never smoked. He has never used smokeless tobacco. He reports that he does not drink alcohol and does not use drugs.   Family History:  The patient's family history includes ADD / ADHD in his daughter, son, and son; Cancer in his maternal aunt and maternal uncle; Coronary artery disease in his brother and brother; Diabetes in his brother; Heart disease in his brother, brother, father, and mother; Hypertension in his mother; Stroke in his father.    ROS:  Please see the history of present illness.   Otherwise, review of systems are positive for none.   All other systems are reviewed and negative.    PHYSICAL EXAM: VS:  BP 120/70 (BP Location: Left Arm, Patient Position: Sitting, Cuff Size: Large)   Pulse 64   Ht '5\' 7"'$  (1.702 m)   Wt (!) 320 lb 2 oz (145.2 kg)   SpO2 98%   BMI 50.14 kg/m  , BMI Body mass index is 50.14 kg/m. GEN: Well nourished, well developed, in no acute distress  HEENT: normal  Neck: no JVD, carotid bruits, or masses Cardiac: RRR; no murmurs, rubs, or gallops,no edema  Respiratory:  clear to auscultation bilaterally, normal work of breathing GI: soft, nontender, nondistended, + BS MS: no deformity or atrophy  Skin: warm and dry, no rash Neuro:  Strength and sensation are intact Psych: euthymic mood, full affect   EKG:  EKG is ordered today. The ekg ordered today demonstrates normal sinus rhythm with PACs, nonspecific IVCD and left axis deviation.  Recent Labs: 12/08/2021: ALT 15; BUN 13; Creatinine, Ser 0.86; Potassium 3.3; Sodium 140    Lipid Panel    Component Value Date/Time   CHOL 107 12/08/2021 0749   TRIG 82.0 12/08/2021 0749   TRIG 130 08/10/2006 1022   HDL 40.40 12/08/2021 0749   CHOLHDL 3 12/08/2021 0749   VLDL 16.4 12/08/2021 0749   LDLCALC 50 12/08/2021 0749   LDLDIRECT 153.9 09/10/2007 1029      Wt Readings from Last  3 Encounters:  04/19/22 (!) 320 lb 2 oz (145.2 kg)  02/03/22 (!) 333 lb 2 oz (151.1 kg)  12/17/21 (!) 354 lb 1.6 oz (160.6 kg)           No data to display            ASSESSMENT AND PLAN:  1.  PVCs and short runs of SVT: Well-controlled with metoprolol.  No evidence of premature beats by exam or EKG.  2.  Essential hypertension: Blood pressure is well controlled on amlodipine, metoprolol and valsartan.  3.  Hyperlipidemia: I reviewed most recent lipid profile which showed an LDL of 50 and triglyceride of 130.  He continues to take simvastatin 20 mg daily with no side effects.  4. Obstructive sleep apnea on CPAP    Disposition:   FU with me in 12 months  Signed,  Kathlyn Sacramento, MD  04/19/2022 4:06 PM    [facility]

## 2022-04-26 ENCOUNTER — Telehealth (INDEPENDENT_AMBULATORY_CARE_PROVIDER_SITE_OTHER): Payer: Medicare Other | Admitting: Family Medicine

## 2022-04-26 ENCOUNTER — Encounter: Payer: Self-pay | Admitting: Family Medicine

## 2022-04-26 VITALS — BP 122/78 | HR 60 | Temp 101.0°F | Ht 67.0 in | Wt 320.0 lb

## 2022-04-26 DIAGNOSIS — U071 COVID-19: Secondary | ICD-10-CM | POA: Insufficient documentation

## 2022-04-26 MED ORDER — NIRMATRELVIR/RITONAVIR (PAXLOVID)TABLET: 3.0000 | ORAL_TABLET | Freq: Two times a day (BID) | ORAL | 0 refills | Status: AC

## 2022-04-26 NOTE — Assessment & Plan Note (Signed)
COVID19  infection. No clear sign of bacterial infection at this time. Mild to moderate symptoms on day 3 of illness. No SOB.  No red flags/need for ER visit or in-person exam at respiratory clinic at this time..    Pt high risk for COVID complications given   immunocompromised state,HTN,obesity and age.  Discussed options to treat COVID including  antivirals   GFR 90.  Reviewed interactions of paxlovid and his medicaitons with patient  In detail..he will hold or  adjust dose of medications as needed ( hold statin). No absolute contraindications to paxlovid noted. If SOB begins symptoms worsening.. have low threshold for in-person exam, if severe shortness of breath ER visit recommended.   Can monitor Oxygen saturation at home with home monitor if able to obtain.  Go to ER if O2 sat < 90% on room air.  Reviewed home care and provided information through [facility].  Recommended quarantine until test returns. If returns positive 5 days isolation recommended. Return to work day 6 and wear mask for 4 more days to complete 10 days. Provided info about prevention of spread of COVID 19.

## 2022-04-26 NOTE — Progress Notes (Signed)
VIRTUAL VISIT A virtual visit is felt to be most appropriate for this patient at this time.   I connected with the patient on 04/26/22 at 12:00 PM EDT by virtual telehealth platform and verified that I am speaking with the correct person using two identifiers.   I discussed the limitations, risks, security and privacy concerns of performing an evaluation and management service by  virtual telehealth platform and the availability of in person appointments. I also discussed with the patient that there may be a patient responsible charge related to this service. The patient expressed understanding and agreed to proceed.  Patient location: Home Provider Location: [facility] [place] Participants: Eliezer Lofts and [name]   Chief Complaint  Patient presents with   Covid Positive    Positive home test yesterday morning x 2    Generalized Body Aches   Fever   Cough    With yellow phlegm    History of Present Illness: 66 year old male with history of obesity, controlled/resolved s/p bariatric surgery type 2 diabetes, diastolic heart failure and hypertension who presents with COVID infection.  He reports initial symptoms noted  on 04/24/22 Positive COVID test x2 on September 25. He reports initial symptoms of fatigue, body aches... progressed to low grade fever, runny nose. Now with 101 F.  Productive cough.  No SOB, no wheeze.   He has been using tylenol for pain every 4-6 hours.  No history of chronic lung disease  COVID 19 screen No recent travel or exposure to COVID 19... girlfriend positive for COVID.  The importance of social distancing was discussed today.   Review of Systems  Constitutional:  Positive for chills, fever and malaise/fatigue.  HENT:  Positive for congestion. Negative for ear pain and sore throat.   Eyes:  Negative for pain and redness.  Respiratory:  Positive for cough. Negative for shortness of breath.   Cardiovascular:  Negative for chest pain,  palpitations and leg swelling.  Gastrointestinal:  Negative for abdominal pain, blood in stool, constipation, diarrhea, nausea and vomiting.  Genitourinary:  Negative for dysuria.  Musculoskeletal:  Positive for myalgias. Negative for falls.  Skin:  Negative for rash.  Neurological:  Negative for dizziness.  Psychiatric/Behavioral:  Negative for depression. The patient is not nervous/anxious.       Past Medical History:  Diagnosis Date   Basal cell carcinoma [id number]   [facility]   Cellulitis 03/11/9146   RLE   Complication of anesthesia ~ 2000   "during OR for kidney stones; anesthesia RX made me code twice in one week"   Depression    Diastolic dysfunction    a. 11/2014 Echo: EF 50-55%, Gr 1 DD.   History of blood transfusion 1958   "I was an Rh baby"   History of kidney stones    Hyperlipidemia    Hypertension    Kidney stones    Midsternal chest pain    Morbid obesity ([place])    On home oxygen therapy    OSA (obstructive sleep apnea)    "wear CPAP"   Personal history of colonic adenoma 09/14/2012   Pre-diabetes     reports that he has never smoked. He has never used smokeless tobacco. He reports that he does not drink alcohol and does not use drugs.   Current Outpatient Medications:    amLODipine (NORVASC) 5 MG tablet, TAKE 1 TABLET BY MOUTH ONCE DAILY, Disp: 90 tablet, Rfl: 1   Calcium Carb-Cholecalciferol (CALCIUM 500 + D PO), Take  3 tablets by mouth in the morning and at bedtime., Disp: , Rfl:    metoprolol tartrate (LOPRESSOR) 25 MG tablet, TAKE 1 TABLET BY MOUTH EVERY 12 HOURS, Disp: 180 tablet, Rfl: 3   Multiple Vitamins-Minerals (BARIATRIC MULTIVITAMINS/IRON) CAPS, Take 1 capsule by mouth daily., Disp: , Rfl:    nystatin (MYCOSTATIN/NYSTOP) powder, Apply topically 2 (two) times daily as needed., Disp: , Rfl:    nystatin-triamcinolone (MYCOLOG II) cream, Apply 1 application topically 2 (two) times daily., Disp: 30 g, Rfl: 0   potassium chloride SA (KLOR-CON  M) 20 MEQ tablet, TAKE ONE AND A HALF TABLETS BY MOUTH EVERY DAY, Disp: 135 tablet, Rfl: 1   simvastatin (ZOCOR) 20 MG tablet, TAKE ONE TABLET AT BEDTIME, Disp: 90 tablet, Rfl: 3   triamcinolone (KENALOG) 0.1 %, APPLY 1 APPLICATION TOPICALLY 2 (TWO) TIMES DAILY. AS NEEDED, Disp: 453.6 g, Rfl: 0   valsartan (DIOVAN) 320 MG tablet, TAKE 1 TABLET BY MOUTH ONCE DAILY, Disp: 90 tablet, Rfl: 3   venlafaxine XR (EFFEXOR-XR) 75 MG 24 hr capsule, TAKE THREE CAPSULES DAILY, Disp: 270 capsule, Rfl: 1   Observations/Objective: Blood pressure 122/78, pulse 60, temperature (!) 101 F (38.3 C), temperature source Oral, height '5\' 7"'$  (1.702 m), weight (!) 320 lb (145.2 kg), SpO2 98 %.  Wt Readings from Last 3 Encounters:  04/26/22 (!) 320 lb (145.2 kg)  04/19/22 (!) 320 lb 2 oz (145.2 kg)  02/03/22 (!) 333 lb 2 oz (151.1 kg)    Physical Exam  Physical Exam Constitutional:      General: The patient is not in acute distress. Pulmonary:     Effort: Pulmonary effort is normal. No respiratory distress.  Neurological:     Mental Status: The patient is alert and oriented to person, place, and time.  Psychiatric:        Mood and Affect: Mood normal.        Behavior: Behavior normal.   Assessment and Plan    Problem List Items Addressed This Visit     COVID-19 - Primary    COVID19  infection. No clear sign of bacterial infection at this time. Mild to moderate symptoms on day 3 of illness. No SOB.  No red flags/need for ER visit or in-person exam at respiratory clinic at this time..    Pt high risk for COVID complications given   immunocompromised state,HTN,obesity and age.  Discussed options to treat COVID including  antivirals   GFR 90.  Reviewed interactions of paxlovid and his medicaitons with patient  In detail..he will hold or  adjust dose of medications as needed ( hold statin). No absolute contraindications to paxlovid noted. If SOB begins symptoms worsening.. have low threshold for in-person  exam, if severe shortness of breath ER visit recommended.   Can monitor Oxygen saturation at home with home monitor if able to obtain.  Go to ER if O2 sat < 90% on room air.  Reviewed home care and provided information through [facility].  Recommended quarantine until test returns. If returns positive 5 days isolation recommended. Return to work day 6 and wear mask for 4 more days to complete 10 days. Provided info about prevention of spread of COVID 19.       Relevant Medications   nirmatrelvir/ritonavir EUA (PAXLOVID) 20 x 150 MG & 10 x '100MG'$  TABS    I discussed the assessment and treatment plan with the patient. The patient was provided an opportunity to ask questions and all were answered. The patient agreed  with the plan and demonstrated an understanding of the instructions.   The patient was advised to call back or seek an in-person evaluation if the symptoms worsen or if the condition fails to improve as anticipated.      Eliezer Lofts, MD

## 2022-04-28 DIAGNOSIS — G4733 Obstructive sleep apnea (adult) (pediatric): Secondary | ICD-10-CM | POA: Diagnosis not present

## 2022-05-02 ENCOUNTER — Other Ambulatory Visit: Payer: Self-pay | Admitting: Family Medicine

## 2022-05-02 NOTE — Telephone Encounter (Signed)
Last office visit (virtual) on 04/26/22 for Covid.  Last refilled 09/28/20 for 453.6 g with no refills.  Next Appt: 06/21/22 for DM/HTN

## 2022-05-03 NOTE — Telephone Encounter (Signed)
Spoke with [employer].  He states he uses it on his right leg from the scarring where he had cellulitis in the past.

## 2022-05-09 DIAGNOSIS — H52223 Regular astigmatism, bilateral: Secondary | ICD-10-CM | POA: Diagnosis not present

## 2022-05-09 DIAGNOSIS — H5213 Myopia, bilateral: Secondary | ICD-10-CM | POA: Diagnosis not present

## 2022-05-09 DIAGNOSIS — H35033 Hypertensive retinopathy, bilateral: Secondary | ICD-10-CM | POA: Diagnosis not present

## 2022-05-09 DIAGNOSIS — H524 Presbyopia: Secondary | ICD-10-CM | POA: Diagnosis not present

## 2022-05-09 DIAGNOSIS — E119 Type 2 diabetes mellitus without complications: Secondary | ICD-10-CM | POA: Diagnosis not present

## 2022-05-09 DIAGNOSIS — H2513 Age-related nuclear cataract, bilateral: Secondary | ICD-10-CM | POA: Diagnosis not present

## 2022-05-09 DIAGNOSIS — Z7984 Long term (current) use of oral hypoglycemic drugs: Secondary | ICD-10-CM | POA: Diagnosis not present

## 2022-05-09 LAB — HM DIABETES EYE EXAM

## 2022-05-10 ENCOUNTER — Encounter: Payer: Self-pay | Admitting: Family Medicine

## 2022-05-24 ENCOUNTER — Encounter: Payer: Self-pay | Admitting: Family Medicine

## 2022-05-24 DIAGNOSIS — E118 Type 2 diabetes mellitus with unspecified complications: Secondary | ICD-10-CM

## 2022-05-28 DIAGNOSIS — G4733 Obstructive sleep apnea (adult) (pediatric): Secondary | ICD-10-CM | POA: Diagnosis not present

## 2022-06-09 NOTE — Telephone Encounter (Signed)
-----   Message from Ellamae Sia sent at 06/01/2022 11:40 AM EDT ----- Regarding: Lab orders for Tuesday, 11.14.23 Lab orders for a 6 month follow up appt

## 2022-06-14 ENCOUNTER — Other Ambulatory Visit (INDEPENDENT_AMBULATORY_CARE_PROVIDER_SITE_OTHER): Payer: Medicare Other

## 2022-06-14 DIAGNOSIS — E118 Type 2 diabetes mellitus with unspecified complications: Secondary | ICD-10-CM

## 2022-06-14 LAB — COMPREHENSIVE METABOLIC PANEL
ALT: 20 U/L (ref 0–53)
AST: 20 U/L (ref 0–37)
Albumin: 3.8 g/dL (ref 3.5–5.2)
Alkaline Phosphatase: 100 U/L (ref 39–117)
BUN: 15 mg/dL (ref 6–23)
CO2: 31 mEq/L (ref 19–32)
Calcium: 8.9 mg/dL (ref 8.4–10.5)
Chloride: 104 mEq/L (ref 96–112)
Creatinine, Ser: 0.8 mg/dL (ref 0.40–1.50)
GFR: 92.75 mL/min (ref >60.00)
Glucose, Bld: 100 mg/dL — ABNORMAL HIGH (ref 70–99)
Potassium: 3.7 mEq/L (ref 3.5–5.1)
Sodium: 140 mEq/L (ref 135–145)
Total Bilirubin: 0.4 mg/dL (ref 0.2–1.2)
Total Protein: 6.8 g/dL (ref 6.0–8.3)

## 2022-06-14 LAB — LIPID PANEL
Cholesterol: 112 mg/dL (ref 0–200)
HDL: 45 mg/dL (ref >39.00)
LDL Cholesterol: 51 mg/dL (ref 0–99)
NonHDL: 67.04
Total CHOL/HDL Ratio: 2
Triglycerides: 78 mg/dL (ref 0.0–149.0)
VLDL: 15.6 mg/dL (ref 0.0–40.0)

## 2022-06-14 LAB — MICROALBUMIN / CREATININE URINE RATIO
Creatinine,U: 125.2 mg/dL
Microalb Creat Ratio: 1.9 mg/g (ref 0.0–30.0)
Microalb, Ur: 2.4 mg/dL — ABNORMAL HIGH (ref 0.0–1.9)

## 2022-06-14 LAB — HEMOGLOBIN A1C: Hgb A1c MFr Bld: 5.2 % (ref 4.6–6.5)

## 2022-06-14 NOTE — Progress Notes (Signed)
No critical labs need to be addressed urgently. We will discuss labs in detail at upcoming office visit.   

## 2022-06-21 ENCOUNTER — Ambulatory Visit: Payer: Medicare Other | Admitting: Family Medicine

## 2022-06-28 ENCOUNTER — Ambulatory Visit (INDEPENDENT_AMBULATORY_CARE_PROVIDER_SITE_OTHER): Payer: Medicare Other | Admitting: Family Medicine

## 2022-06-28 ENCOUNTER — Encounter: Payer: Self-pay | Admitting: Family Medicine

## 2022-06-28 VITALS — BP 140/80 | HR 62 | Temp 98.6°F | Ht 68.0 in | Wt 310.1 lb

## 2022-06-28 DIAGNOSIS — E118 Type 2 diabetes mellitus with unspecified complications: Secondary | ICD-10-CM | POA: Diagnosis not present

## 2022-06-28 DIAGNOSIS — I5032 Chronic diastolic (congestive) heart failure: Secondary | ICD-10-CM

## 2022-06-28 DIAGNOSIS — E1159 Type 2 diabetes mellitus with other circulatory complications: Secondary | ICD-10-CM | POA: Diagnosis not present

## 2022-06-28 DIAGNOSIS — I152 Hypertension secondary to endocrine disorders: Secondary | ICD-10-CM

## 2022-06-28 DIAGNOSIS — E785 Hyperlipidemia, unspecified: Secondary | ICD-10-CM

## 2022-06-28 DIAGNOSIS — E1169 Type 2 diabetes mellitus with other specified complication: Secondary | ICD-10-CM | POA: Diagnosis not present

## 2022-06-28 DIAGNOSIS — G4733 Obstructive sleep apnea (adult) (pediatric): Secondary | ICD-10-CM | POA: Diagnosis not present

## 2022-06-28 NOTE — Progress Notes (Signed)
Patient ID: Blake Garcia, male    DOB: 09/30/1955, 66 y.o.   MRN: [id number]  This visit was conducted in person.  BP (!) 140/80   Pulse 62   Temp 98.6 F (37 C) (Oral)   Ht '5\' 8"'$  (1.727 m)   Wt (!) 310 lb 2 oz (140.7 kg)   SpO2 96%   BMI 47.15 kg/m    CC:  Chief Complaint  Patient presents with   Follow-up    DM/HTN/CHOL    Subjective:   HPI: Blake Garcia is a 66 y.o. male presenting on 06/28/2022 for Follow-up (DM/HTN/CHOL)  Diabetes:  resolved s/p bariatric surgery Lab Results  Component Value Date   HGBA1C 5.2 06/14/2022  Using medications without difficulties: Hypoglycemic episodes: Hyperglycemic episodes: Feet problems: no ulcers Blood Sugars averaging: not checking eye exam within last year: yes  Hypertension:   Borderline control in office today on amlodipine 5 mg daily, at bedtime metoprolol  25 mg BID  BP Readings from Last 3 Encounters:  06/28/22 (!) 140/80  04/26/22 122/78  04/19/22 120/70  Using medication without problems or lightheadedness:  none Chest pain with exertion:none Edema:none Short of breath:none Average home BPs:  142/86, 132/86. Other issues:  Wt Readings from Last 3 Encounters:  06/28/22 (!) 310 lb 2 oz (140.7 kg)  04/26/22 (!) 320 lb (145.2 kg)  04/19/22 (!) 320 lb 2 oz (145.2 kg)  Body mass index is 47.15 kg/m.    Elevated Cholesterol: LDL at goal on simvastatin 20 mg daily, valsartan 320 mg daily  Lab Results  Component Value Date   CHOL 112 06/14/2022   HDL 45.00 06/14/2022   LDLCALC 51 06/14/2022   LDLDIRECT 153.9 09/10/2007   TRIG 78.0 06/14/2022   CHOLHDL 2 06/14/2022  Using medications without problems: Muscle aches:  Diet compliance: good Exercise: walking regularly. Other complaints:       Relevant past medical, surgical, family and social history reviewed and updated as indicated. Interim medical history since our last visit reviewed. Allergies and medications reviewed and  updated. Outpatient Medications Prior to Visit  Medication Sig Dispense Refill   amLODipine (NORVASC) 5 MG tablet TAKE 1 TABLET BY MOUTH ONCE DAILY 90 tablet 1   Calcium Carb-Cholecalciferol (CALCIUM 500 + D PO) Take 3 tablets by mouth in the morning and at bedtime.     metoprolol tartrate (LOPRESSOR) 25 MG tablet TAKE 1 TABLET BY MOUTH EVERY 12 HOURS 180 tablet 3   Multiple Vitamins-Minerals (BARIATRIC MULTIVITAMINS/IRON) CAPS Take 1 capsule by mouth daily.     nystatin (MYCOSTATIN/NYSTOP) powder Apply topically 2 (two) times daily as needed.     nystatin-triamcinolone (MYCOLOG II) cream Apply 1 application topically 2 (two) times daily. 30 g 0   potassium chloride SA (KLOR-CON M) 20 MEQ tablet TAKE ONE AND A HALF TABLETS BY MOUTH EVERY DAY 135 tablet 1   simvastatin (ZOCOR) 20 MG tablet TAKE ONE TABLET AT BEDTIME 90 tablet 3   triamcinolone cream (KENALOG) 0.1 % APPLY TO AFFECTED AREAS TWICE A DAY IF NEEDED 100 g 0   valsartan (DIOVAN) 320 MG tablet TAKE 1 TABLET BY MOUTH ONCE DAILY 90 tablet 3   venlafaxine XR (EFFEXOR-XR) 75 MG 24 hr capsule TAKE THREE CAPSULES DAILY 270 capsule 1   No facility-administered medications prior to visit.     Per HPI unless specifically indicated in ROS section below Review of Systems  Constitutional:  Negative for fatigue and fever.  HENT:  Negative for ear pain.  Eyes:  Negative for pain.  Respiratory:  Negative for cough and shortness of breath.   Cardiovascular:  Negative for chest pain, palpitations and leg swelling.  Gastrointestinal:  Negative for abdominal pain.  Genitourinary:  Negative for dysuria.  Musculoskeletal:  Negative for arthralgias.  Neurological:  Negative for syncope, light-headedness and headaches.  Psychiatric/Behavioral:  Negative for dysphoric mood.    Objective:  BP (!) 140/80   Pulse 62   Temp 98.6 F (37 C) (Oral)   Ht '5\' 8"'$  (1.727 m)   Wt (!) 310 lb 2 oz (140.7 kg)   SpO2 96%   BMI 47.15 kg/m   Wt Readings from  Last 3 Encounters:  06/28/22 (!) 310 lb 2 oz (140.7 kg)  04/26/22 (!) 320 lb (145.2 kg)  04/19/22 (!) 320 lb 2 oz (145.2 kg)      Physical Exam Constitutional:      Appearance: He is well-developed.  HENT:     Head: Normocephalic.     Right Ear: Hearing normal.     Left Ear: Hearing normal.     Nose: Nose normal.  Neck:     Thyroid: No thyroid mass or thyromegaly.     Vascular: No carotid bruit.     Trachea: Trachea normal.  Cardiovascular:     Rate and Rhythm: Normal rate and regular rhythm.     Pulses: Normal pulses.     Heart sounds: Heart sounds not distant. No murmur heard.    No friction rub. No gallop.     Comments: No peripheral edema Pulmonary:     Effort: Pulmonary effort is normal. No respiratory distress.     Breath sounds: Normal breath sounds.  Skin:    General: Skin is warm and dry.     Findings: No rash.  Psychiatric:        Speech: Speech normal.        Behavior: Behavior normal.        Thought Content: Thought content normal.     Diabetic foot exam: Normal inspection No skin breakdown No calluses  Normal DP pulses Normal sensation to light touch and monofilament Nails normal     Results for orders placed or performed in visit on 06/14/22  Microalbumin / creatinine urine ratio  Result Value Ref Range   Microalb, Ur 2.4 (H) 0.0 - 1.9 mg/dL   Creatinine,U 125.2 mg/dL   Microalb Creat Ratio 1.9 0.0 - 30.0 mg/g  Comprehensive metabolic panel  Result Value Ref Range   Sodium 140 135 - 145 mEq/L   Potassium 3.7 3.5 - 5.1 mEq/L   Chloride 104 96 - 112 mEq/L   CO2 31 19 - 32 mEq/L   Glucose, Bld 100 (H) 70 - 99 mg/dL   BUN 15 6 - 23 mg/dL   Creatinine, Ser 0.80 0.40 - 1.50 mg/dL   Total Bilirubin 0.4 0.2 - 1.2 mg/dL   Alkaline Phosphatase 100 39 - 117 U/L   AST 20 0 - 37 U/L   ALT 20 0 - 53 U/L   Total Protein 6.8 6.0 - 8.3 g/dL   Albumin 3.8 3.5 - 5.2 g/dL   GFR 92.75 >60.00 mL/min   Calcium 8.9 8.4 - 10.5 mg/dL  Lipid panel  Result  Value Ref Range   Cholesterol 112 0 - 200 mg/dL   Triglycerides 78.0 0.0 - 149.0 mg/dL   HDL 45.00 >39.00 mg/dL   VLDL 15.6 0.0 - 40.0 mg/dL   LDL Cholesterol 51 0 - 99 mg/dL  Total CHOL/HDL Ratio 2    NonHDL 67.04   Hemoglobin A1c  Result Value Ref Range   Hgb A1c MFr Bld 5.2 4.6 - 6.5 %     COVID 19 screen:  No recent travel or known exposure to COVID19 The patient denies respiratory symptoms of COVID 19 at this time. The importance of social distancing was discussed today.   Assessment and Plan    Problem List Items Addressed This Visit     Controlled type 2 diabetes mellitus with complication, without long-term current use of insulin ([place]) - Primary (Chronic)    Chronic, resolved status post bariatric surgery.      Diastolic heart failure ([facility]) (Chronic)    Chronic, euvolemic in office today.      Hyperlipidemia associated with type 2 diabetes mellitus ([facility]) (Chronic)    LDL at goal on simvastatin 20 mg daily,       Hypertension associated with diabetes ([place]) (Chronic)     Hypertension:   Borderline control in office today on amlodipine 5 mg daily, at bedtime metoprolol  25 mg BID, valsartan 320 mg daily         Eliezer Lofts, MD

## 2022-07-02 ENCOUNTER — Other Ambulatory Visit: Payer: Self-pay | Admitting: Family Medicine

## 2022-07-02 DIAGNOSIS — F3342 Major depressive disorder, recurrent, in full remission: Secondary | ICD-10-CM

## 2022-07-16 DIAGNOSIS — G4733 Obstructive sleep apnea (adult) (pediatric): Secondary | ICD-10-CM | POA: Diagnosis not present

## 2022-07-18 NOTE — Assessment & Plan Note (Signed)
LDL at goal on simvastatin 20 mg daily,

## 2022-07-18 NOTE — Assessment & Plan Note (Addendum)
  Hypertension:   Borderline control in office today on amlodipine 5 mg daily, at bedtime metoprolol  25 mg BID, valsartan 320 mg daily

## 2022-07-18 NOTE — Assessment & Plan Note (Signed)
Chronic, euvolemic in office today.

## 2022-07-18 NOTE — Assessment & Plan Note (Signed)
Chronic, resolved status post bariatric surgery.

## 2022-07-27 ENCOUNTER — Inpatient Hospital Stay
Admission: EM | Admit: 2022-07-27 | Discharge: 2022-07-29 | DRG: 308 | Disposition: A | Discharge status: Other Acute Inpatient Hospital | Payer: Medicare Other | Attending: Internal Medicine | Admitting: Internal Medicine

## 2022-07-27 ENCOUNTER — Inpatient Hospital Stay: Payer: Medicare Other

## 2022-07-27 ENCOUNTER — Emergency Department: Payer: Medicare Other

## 2022-07-27 ENCOUNTER — Encounter: Payer: Self-pay | Admitting: Cardiology

## 2022-07-27 ENCOUNTER — Telehealth: Payer: Self-pay | Admitting: Cardiovascular Disease

## 2022-07-27 ENCOUNTER — Ambulatory Visit: Payer: Medicare Other | Attending: Cardiology | Admitting: Cardiology

## 2022-07-27 ENCOUNTER — Other Ambulatory Visit: Payer: Self-pay

## 2022-07-27 ENCOUNTER — Encounter: Payer: Self-pay | Admitting: Internal Medicine

## 2022-07-27 VITALS — BP 198/88 | HR 57 | Ht 67.0 in | Wt 298.0 lb

## 2022-07-27 DIAGNOSIS — Z85828 Personal history of other malignant neoplasm of skin: Secondary | ICD-10-CM | POA: Diagnosis not present

## 2022-07-27 DIAGNOSIS — E785 Hyperlipidemia, unspecified: Secondary | ICD-10-CM | POA: Diagnosis not present

## 2022-07-27 DIAGNOSIS — Z9989 Dependence on other enabling machines and devices: Secondary | ICD-10-CM

## 2022-07-27 DIAGNOSIS — Z79899 Other long term (current) drug therapy: Secondary | ICD-10-CM | POA: Diagnosis not present

## 2022-07-27 DIAGNOSIS — I110 Hypertensive heart disease with heart failure: Secondary | ICD-10-CM | POA: Diagnosis not present

## 2022-07-27 DIAGNOSIS — R7989 Other specified abnormal findings of blood chemistry: Secondary | ICD-10-CM | POA: Diagnosis not present

## 2022-07-27 DIAGNOSIS — J811 Chronic pulmonary edema: Secondary | ICD-10-CM | POA: Diagnosis not present

## 2022-07-27 DIAGNOSIS — J449 Chronic obstructive pulmonary disease, unspecified: Secondary | ICD-10-CM | POA: Diagnosis not present

## 2022-07-27 DIAGNOSIS — I442 Atrioventricular block, complete: Secondary | ICD-10-CM

## 2022-07-27 DIAGNOSIS — R012 Other cardiac sounds: Secondary | ICD-10-CM | POA: Diagnosis not present

## 2022-07-27 DIAGNOSIS — G4733 Obstructive sleep apnea (adult) (pediatric): Secondary | ICD-10-CM | POA: Diagnosis not present

## 2022-07-27 DIAGNOSIS — I5030 Unspecified diastolic (congestive) heart failure: Secondary | ICD-10-CM | POA: Diagnosis present

## 2022-07-27 DIAGNOSIS — I471 Supraventricular tachycardia, unspecified: Secondary | ICD-10-CM | POA: Diagnosis not present

## 2022-07-27 DIAGNOSIS — I152 Hypertension secondary to endocrine disorders: Secondary | ICD-10-CM | POA: Diagnosis not present

## 2022-07-27 DIAGNOSIS — E876 Hypokalemia: Secondary | ICD-10-CM | POA: Diagnosis present

## 2022-07-27 DIAGNOSIS — E119 Type 2 diabetes mellitus without complications: Secondary | ICD-10-CM | POA: Diagnosis not present

## 2022-07-27 DIAGNOSIS — E8881 Metabolic syndrome: Secondary | ICD-10-CM | POA: Diagnosis present

## 2022-07-27 DIAGNOSIS — I4430 Unspecified atrioventricular block: Secondary | ICD-10-CM | POA: Diagnosis not present

## 2022-07-27 DIAGNOSIS — I10 Essential (primary) hypertension: Secondary | ICD-10-CM

## 2022-07-27 DIAGNOSIS — Z823 Family history of stroke: Secondary | ICD-10-CM

## 2022-07-27 DIAGNOSIS — Z6841 Body Mass Index (BMI) 40.0 and over, adult: Secondary | ICD-10-CM | POA: Diagnosis not present

## 2022-07-27 DIAGNOSIS — Z9049 Acquired absence of other specified parts of digestive tract: Secondary | ICD-10-CM | POA: Diagnosis not present

## 2022-07-27 DIAGNOSIS — I3139 Other pericardial effusion (noninflammatory): Secondary | ICD-10-CM | POA: Diagnosis not present

## 2022-07-27 DIAGNOSIS — R0602 Shortness of breath: Secondary | ICD-10-CM | POA: Diagnosis not present

## 2022-07-27 DIAGNOSIS — E1169 Type 2 diabetes mellitus with other specified complication: Secondary | ICD-10-CM | POA: Diagnosis not present

## 2022-07-27 DIAGNOSIS — I509 Heart failure, unspecified: Secondary | ICD-10-CM | POA: Diagnosis not present

## 2022-07-27 DIAGNOSIS — Z950 Presence of cardiac pacemaker: Secondary | ICD-10-CM | POA: Diagnosis not present

## 2022-07-27 DIAGNOSIS — R072 Precordial pain: Secondary | ICD-10-CM

## 2022-07-27 DIAGNOSIS — Z87442 Personal history of urinary calculi: Secondary | ICD-10-CM | POA: Diagnosis not present

## 2022-07-27 DIAGNOSIS — R079 Chest pain, unspecified: Secondary | ICD-10-CM | POA: Diagnosis not present

## 2022-07-27 DIAGNOSIS — I5033 Acute on chronic diastolic (congestive) heart failure: Secondary | ICD-10-CM | POA: Diagnosis not present

## 2022-07-27 DIAGNOSIS — R001 Bradycardia, unspecified: Secondary | ICD-10-CM | POA: Diagnosis not present

## 2022-07-27 DIAGNOSIS — R0609 Other forms of dyspnea: Secondary | ICD-10-CM | POA: Diagnosis not present

## 2022-07-27 DIAGNOSIS — Z9884 Bariatric surgery status: Secondary | ICD-10-CM

## 2022-07-27 DIAGNOSIS — F332 Major depressive disorder, recurrent severe without psychotic features: Secondary | ICD-10-CM | POA: Diagnosis present

## 2022-07-27 DIAGNOSIS — I160 Hypertensive urgency: Secondary | ICD-10-CM | POA: Diagnosis present

## 2022-07-27 DIAGNOSIS — J90 Pleural effusion, not elsewhere classified: Secondary | ICD-10-CM | POA: Diagnosis not present

## 2022-07-27 DIAGNOSIS — E66813 Obesity, class 3: Secondary | ICD-10-CM | POA: Diagnosis present

## 2022-07-27 DIAGNOSIS — I493 Ventricular premature depolarization: Secondary | ICD-10-CM | POA: Diagnosis not present

## 2022-07-27 DIAGNOSIS — Z833 Family history of diabetes mellitus: Secondary | ICD-10-CM | POA: Diagnosis not present

## 2022-07-27 DIAGNOSIS — Z8249 Family history of ischemic heart disease and other diseases of the circulatory system: Secondary | ICD-10-CM | POA: Diagnosis not present

## 2022-07-27 DIAGNOSIS — J9811 Atelectasis: Secondary | ICD-10-CM | POA: Diagnosis not present

## 2022-07-27 DIAGNOSIS — E1159 Type 2 diabetes mellitus with other circulatory complications: Secondary | ICD-10-CM | POA: Diagnosis present

## 2022-07-27 HISTORY — DX: Atrioventricular block, complete: I44.2

## 2022-07-27 LAB — CBC
HCT: 44.4 % (ref 39.0–52.0)
Hemoglobin: 14.7 g/dL (ref 13.0–17.0)
MCH: 30 pg (ref 26.0–34.0)
MCHC: 33.1 g/dL (ref 30.0–36.0)
MCV: 90.6 fL (ref 80.0–100.0)
Platelets: 270 10*3/uL (ref 150–400)
RBC: 4.9 MIL/uL (ref 4.22–5.81)
RDW: 14.1 % (ref 11.5–15.5)
WBC: 7.5 10*3/uL (ref 4.0–10.5)
nRBC: 0 % (ref 0.0–0.2)

## 2022-07-27 LAB — BASIC METABOLIC PANEL
Anion gap: 7 (ref 5–15)
BUN: 14 mg/dL (ref 8–23)
CO2: 28 mmol/L (ref 22–32)
Calcium: 8.9 mg/dL (ref 8.9–10.3)
Chloride: 105 mmol/L (ref 98–111)
Creatinine, Ser: 0.84 mg/dL (ref 0.61–1.24)
GFR, Estimated: >60 mL/min (ref >60)
Glucose, Bld: 110 mg/dL — ABNORMAL HIGH (ref 70–99)
Potassium: 3.4 mmol/L — ABNORMAL LOW (ref 3.5–5.1)
Sodium: 140 mmol/L (ref 135–145)

## 2022-07-27 LAB — D-DIMER, QUANTITATIVE: D-Dimer, Quant: 0.84 ug/mL-FEU — ABNORMAL HIGH (ref 0.00–0.50)

## 2022-07-27 LAB — BRAIN NATRIURETIC PEPTIDE: B Natriuretic Peptide: 650.3 pg/mL — ABNORMAL HIGH (ref 0.0–100.0)

## 2022-07-27 LAB — TROPONIN I (HIGH SENSITIVITY)
Troponin I (High Sensitivity): 13 ng/L (ref <18)
Troponin I (High Sensitivity): 15 ng/L (ref <18)

## 2022-07-27 LAB — MAGNESIUM: Magnesium: 2 mg/dL (ref 1.7–2.4)

## 2022-07-27 MED ORDER — HEPARIN SODIUM (PORCINE) 5000 UNIT/ML IJ SOLN
5000.0000 [IU] | Freq: Three times a day (TID) | INTRAMUSCULAR | Status: DC
  Administered 2022-07-27 – 2022-07-29 (×5): 5000 [IU] via SUBCUTANEOUS
  Filled 2022-07-27 (×5): qty 1

## 2022-07-27 MED ORDER — ACETAMINOPHEN 325 MG PO TABS
650.0000 mg | ORAL_TABLET | Freq: Four times a day (QID) | ORAL | Status: DC | PRN
  Administered 2022-07-28 – 2022-07-29 (×2): 650 mg via ORAL
  Filled 2022-07-27 (×2): qty 2

## 2022-07-27 MED ORDER — NYSTATIN 100000 UNIT/GM EX POWD: Freq: Two times a day (BID) | CUTANEOUS | Status: DC | PRN

## 2022-07-27 MED ORDER — POTASSIUM CHLORIDE CRYS ER 20 MEQ PO TBCR
20.0000 meq | EXTENDED_RELEASE_TABLET | Freq: Once | ORAL | Status: AC
  Administered 2022-07-27: 20 meq via ORAL
  Filled 2022-07-27: qty 1

## 2022-07-27 MED ORDER — ONDANSETRON HCL 4 MG PO TABS: 4.0000 mg | ORAL_TABLET | Freq: Four times a day (QID) | ORAL | Status: DC | PRN

## 2022-07-27 MED ORDER — SENNOSIDES-DOCUSATE SODIUM 8.6-50 MG PO TABS: 1.0000 | ORAL_TABLET | Freq: Every evening | ORAL | Status: DC | PRN

## 2022-07-27 MED ORDER — IOHEXOL 350 MG/ML SOLN
100.0000 mL | Freq: Once | INTRAVENOUS | Status: AC | PRN
  Administered 2022-07-27: 100 mL via INTRAVENOUS

## 2022-07-27 MED ORDER — HYDRALAZINE HCL 20 MG/ML IJ SOLN
5.0000 mg | Freq: Three times a day (TID) | INTRAMUSCULAR | Status: DC | PRN
  Administered 2022-07-28: 5 mg via INTRAVENOUS
  Filled 2022-07-27: qty 1

## 2022-07-27 MED ORDER — VENLAFAXINE HCL ER 75 MG PO CP24: 75.0000 mg | ORAL_CAPSULE | Freq: Every day | ORAL | Status: DC

## 2022-07-27 MED ORDER — ACETAMINOPHEN 650 MG RE SUPP: 650.0000 mg | Freq: Four times a day (QID) | RECTAL | Status: DC | PRN

## 2022-07-27 MED ORDER — ONDANSETRON HCL 4 MG/2ML IJ SOLN: 4.0000 mg | Freq: Four times a day (QID) | INTRAMUSCULAR | Status: DC | PRN

## 2022-07-27 MED ORDER — SIMVASTATIN 20 MG PO TABS
20.0000 mg | ORAL_TABLET | Freq: Every day | ORAL | Status: DC
  Administered 2022-07-27 – 2022-07-28 (×2): 20 mg via ORAL
  Filled 2022-07-27 (×2): qty 2

## 2022-07-27 MED ORDER — FUROSEMIDE 10 MG/ML IJ SOLN
40.0000 mg | Freq: Once | INTRAMUSCULAR | Status: AC
  Administered 2022-07-27: 40 mg via INTRAVENOUS
  Filled 2022-07-27: qty 4

## 2022-07-27 NOTE — Assessment & Plan Note (Signed)
-   CPAP nightly ordered 

## 2022-07-27 NOTE — ED Notes (Signed)
Pt oxygen between 88-93%. Pt placed on 2L [place] at this time. MD made aware.

## 2022-07-27 NOTE — Hospital Course (Signed)
Mr. Blake Garcia is a 66 year old male with history of heart failure preserved ejection fraction, COPD, morbid obesity, who presents emergency department for chief concerns of shortness of breath.  Patient called his cardiology office stating that he has been short of breath for the last 3-5 days.  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  Initial vitals in the ED showed temperature of 98.8, respiration rate of 15, heart rate of 57, blood pressure 198/88, currently improved to 171/84, SpO2 of 91% on room air.  Serum sodium is 140, potassium 2.5, chloride 105, bicarb 28, BUN of 14, serum creatinine of 0.84, EGFR greater than 60, nonfasting blood glucose 110, WBC 7.5, hemoglobin 14.7, platelets of 270.  High sensitive troponin was 13.  ED treatment: Furosemide 40 mg IV one-time dose.

## 2022-07-27 NOTE — ED Notes (Signed)
Assisted pt in using the urinal. Pt connected to zoll at this time. Call bell in reach.

## 2022-07-27 NOTE — Progress Notes (Signed)
Patient taken to the ED per provider request for 2 & 3 degree heart block with chest pain, shortness of breath, hypertensive urgency. SBP was 206 & 198 here in our office.   Called and spoke with Nira Conn RN Charge nurse to update her on patient and reason we are bringing him down.   Patient transported alert, oriented, and no acute distress. Triage nurse aware.

## 2022-07-27 NOTE — Progress Notes (Signed)
Cardiology Clinic Note   Patient Name: Blake Garcia Date of Encounter: 07/27/2022  Primary Care Provider:  Jinny Sanders, MD Primary Cardiologist:  Kathlyn Sacramento, MD  Patient Profile    66 year old male with a past medical history of hypertension, hyperlipidemia, obstructive sleep apnea on CPAP, diabetes, obesity, chronic diastolic heart failure, who is here today being seen as work for increased shortness of breath over the last 3-5 days.  Past Medical History    Past Medical History:  Diagnosis Date   Basal cell carcinoma [id number]   [facility]   Cellulitis 02/01/5008   RLE   Complication of anesthesia ~ 2000   "during OR for kidney stones; anesthesia RX made me code twice in one week"   Depression    Diastolic dysfunction    a. 11/2014 Echo: EF 50-55%, Gr 1 DD.   History of blood transfusion 1958   "I was an Rh baby"   History of kidney stones    Hyperlipidemia    Hypertension    Kidney stones    Midsternal chest pain    Morbid obesity ([place])    On home oxygen therapy    OSA (obstructive sleep apnea)    "wear CPAP"   Personal history of colonic adenoma 09/14/2012   Pre-diabetes    Past Surgical History:  Procedure Laterality Date   CHOLECYSTECTOMY  1993   COLONOSCOPY     COLONOSCOPY WITH PROPOFOL N/A 07/28/2020   Procedure: COLONOSCOPY WITH PROPOFOL;  Surgeon: Gatha Mayer, MD;  Location: [facility];  Service: Endoscopy;  Laterality: N/A;   ESOPHAGOGASTRODUODENOSCOPY     KNEE ARTHROSCOPY  ~ 2004   right   LAPAROSCOPY     Surgical sleeve   LITHOTRIPSY  2000   POLYPECTOMY  07/28/2020   Procedure: POLYPECTOMY;  Surgeon: Gatha Mayer, MD;  Location: [facility];  Service: Endoscopy;;    Allergies  No Known Allergies  History of Present Illness    Blake Garcia is a 66 year old male with previously mentioned past medical history of hypertension, hyperlipidemia, obstructive sleep apnea on CPAP, type 2 diabetes, obesity status  post gastric sleeve surgery, chronic diastolic heart failure, and longstanding history of PVCs.  Last echocardiogram was completed in 11/19/2019 which revealed an LVEF of 55%, left ventricle with hyperdynamic function, there was severe left ventricular hypertrophy, without valvular abnormalities noted.  He also wore a ZIO monitor after being seen in February 2021 for exertional chest pain and fatigue with palpitations.  Monitor showed an average heart rate 99 bpm with 3 episodes of wide-complex tachycardia with the longest lasting 15 beats and 4 runs of SVT.  Occasional PVCs with a burden of 2.6%.  He was evaluated by EP and started on beta-blocker therapy.  He underwent gastric sleeve surgery March 2022 had mild hypotension and thus surgery was not completed.  He returned back to the second part in June 2022 and did well.  At his heaviest he weighed 550 pounds on his last visit was 320.2 pounds.  He was last seen in the office 04/09/2022 by Dr. Fletcher Anon.  At that time he had been doing well without chest pain shortness of breath or palpitations.  He continues to work for the city of [employer].  There were no medication changes that were made and no additional testing that was needed at that time.  He called into the triage line today stating that he has been having increasing shortness of breath over the last  3 to 5 days.  He had swelling and chest pain.  His blood pressure been increased and at home he was running 170/90 with heart rates in the 40s.  He returned to clinic today with complaints of shortness of breath with rest and on exertion that he has noted over the last 3-5 days. He also noted an increase in his blood pressure.  He states that he has noticed that he has had chest tightness with associated shortness of breath and elevated blood pressure that was concerning for stroke or heart attack.  He does continue to monitor his pressure and heart rate at home.  He also noted that the last heart rate that  he had gone within the 40s were typically runs in the 60s which caused some concern for him as well.  He had denied any dizziness, lightheadedness, syncope or near syncopal episodes.  He states that he was having to use his motorized chair more frequently than normal.  Home Medications    Current Outpatient Medications  Medication Sig Dispense Refill   amLODipine (NORVASC) 5 MG tablet TAKE 1 TABLET BY MOUTH ONCE DAILY 90 tablet 1   Calcium Carb-Cholecalciferol (CALCIUM 500 + D PO) Take 3 tablets by mouth in the morning and at bedtime.     metoprolol tartrate (LOPRESSOR) 25 MG tablet TAKE 1 TABLET BY MOUTH EVERY 12 HOURS 180 tablet 3   Multiple Vitamins-Minerals (BARIATRIC MULTIVITAMINS/IRON) CAPS Take 1 capsule by mouth daily.     nystatin (MYCOSTATIN/NYSTOP) powder Apply topically 2 (two) times daily as needed.     nystatin-triamcinolone (MYCOLOG II) cream Apply 1 application topically 2 (two) times daily. 30 g 0   potassium chloride SA (KLOR-CON M) 20 MEQ tablet TAKE ONE AND A HALF TABLETS BY MOUTH EVERY DAY 135 tablet 1   simvastatin (ZOCOR) 20 MG tablet TAKE ONE TABLET AT BEDTIME 90 tablet 3   triamcinolone cream (KENALOG) 0.1 % APPLY TO AFFECTED AREAS TWICE A DAY IF NEEDED 100 g 0   valsartan (DIOVAN) 320 MG tablet TAKE 1 TABLET BY MOUTH ONCE DAILY 90 tablet 3   venlafaxine XR (EFFEXOR-XR) 75 MG 24 hr capsule TAKE THREE CAPSULES DAILY 270 capsule 1   No current facility-administered medications for this visit.     Family History    Family History  Problem Relation Age of Onset   Hypertension Mother    Heart disease Mother    Heart disease Father    Stroke Father    Heart disease Brother    Coronary artery disease Brother    Diabetes Brother    Cancer Maternal Aunt        breast   Cancer Maternal Uncle        lung   Heart disease Brother    Coronary artery disease Brother    ADD / ADHD Son    ADD / ADHD Son    ADD / ADHD Daughter    He indicated that his mother is  deceased. He indicated that his father is deceased. He indicated that only one of his two brothers is alive. He indicated that the status of his daughter is unknown. He indicated that the status of his maternal aunt is unknown. He indicated that the status of his maternal uncle is unknown.  Social History    Social History   Socioeconomic History   Marital status: Divorced    Spouse name: Not on file   Number of children: 3   Years of education: Not  on file   Highest education level: Some college, no degree  Occupational History   Occupation: not employed  Tobacco Use   Smoking status: Never   Smokeless tobacco: Never  Vaping Use   Vaping Use: Never used  Substance and Sexual Activity   Alcohol use: No    Alcohol/week: 0.0 standard drinks of alcohol    Comment: 02/09/12 "may have driink at party once/year"   Drug use: No   Sexual activity: Not Currently  Other Topics Concern   Not on file  Social History Narrative   Uses a scooter for ambulation   He is disabled   He is divorced and has 3 children   No alcohol never smoker no drug use no current tobacco   Social Determinants of Health   Financial Resource Strain: Low Risk  (03/09/2022)   Overall Financial Resource Strain (CARDIA)    Difficulty of Paying Living Expenses: Not hard at all  Food Insecurity: No Food Insecurity (03/09/2022)   Hunger Vital Sign    Worried About Running Out of Food in the Last Year: Never true    [place] in the Last Year: Never true  Transportation Needs: No Transportation Needs (03/09/2022)   PRAPARE - Hydrologist (Medical): No    Lack of Transportation (Non-Medical): No  Physical Activity: Sufficiently Active (03/09/2022)   Exercise Vital Sign    Days of Exercise per Week: 4 days    Minutes of Exercise per Session: 60 min  Stress: No Stress Concern Present (03/09/2022)   [place]    Feeling of  Stress : Not at all  Social Connections: Moderately Integrated (03/09/2022)   Social Connection and Isolation Panel [NHANES]    Frequency of Communication with Friends and Family: More than three times a week    Frequency of Social Gatherings with Friends and Family: Once a week    Attends Religious Services: More than 4 times per year    Active Member of [employer] or Organizations: Yes    Attends Music therapist: More than 4 times per year    Marital Status: Divorced  Intimate Partner Violence: Not At Risk (03/09/2022)   Humiliation, Afraid, Rape, and Kick questionnaire    Fear of Current or Ex-Partner: No    Emotionally Abused: No    Physically Abused: No    Sexually Abused: No     Review of Systems    General:  No chills, fever, night sweats or weight changes.  Endorses fatigue Cardiovascular: Endorses chest pain, dyspnea on exertion, edema, but denies orthopnea, palpitations, paroxysmal nocturnal dyspnea. Dermatological: No rash, lesions/masses Respiratory: No cough, endorses dyspnea Urologic: No hematuria, dysuria Abdominal:   No nausea, vomiting, diarrhea, bright red blood per rectum, melena, or hematemesis Neurologic:  No visual changes, wkns, changes in mental status.  Endorses generalized weakness All other systems reviewed and are otherwise negative except as noted above.   Physical Exam    VS:  BP (!) 206/94 (BP Location: Right Arm, Patient Position: Sitting, Cuff Size: Large)   Pulse (!) 57   Ht '5\' 7"'$  (1.702 m)   Wt 298 lb (135.2 kg)   SpO2 93%   BMI 46.67 kg/m  , BMI Body mass index is 46.67 kg/m.    Vitals:   07/27/22 1509 07/27/22 1515  BP: (!) 206/94 (!) 198/88     GEN: Well nourished, well developed, in no acute distress. HEENT:  normal.  Glasses on Neck: Supple, no JVD, carotid bruits, or masses. Cardiac: Irregularly irregular, no murmurs, rubs, or gallops. No clubbing, cyanosis, trace edema.  Radials 2+/PT 2+ and equal bilaterally.  Respiratory:   Respirations regular and unlabored, clear with diminished bases to auscultation bilaterally.  Upper airway noise that clears with coughing.  Respirations are unlabored at rest on room air GI: Soft, nontender, nondistended, obese, BS + x 4. MS: no deformity or atrophy. Skin: warm and dry, no rash. Neuro:  Strength and sensation are intact. Psych: Normal affect.  Accessory Clinical Findings    ECG personally reviewed by me today-second-degree AV block Mobitz type I with transient complete heart block, chronic right bundle branch block, and PVCs- No acute changes  Lab Results  Component Value Date   WBC 9.2 09/27/2019   HGB 15.5 09/27/2019   HCT 48.7 09/27/2019   MCV 93.7 09/27/2019   PLT 232 09/27/2019   Lab Results  Component Value Date   CREATININE 0.80 06/14/2022   BUN 15 06/14/2022   NA 140 06/14/2022   K 3.7 06/14/2022   CL 104 06/14/2022   CO2 31 06/14/2022   Lab Results  Component Value Date   ALT 20 06/14/2022   AST 20 06/14/2022   ALKPHOS 100 06/14/2022   BILITOT 0.4 06/14/2022   Lab Results  Component Value Date   CHOL 112 06/14/2022   HDL 45.00 06/14/2022   LDLCALC 51 06/14/2022   LDLDIRECT 153.9 09/10/2007   TRIG 78.0 06/14/2022   CHOLHDL 2 06/14/2022    Lab Results  Component Value Date   HGBA1C 5.2 06/14/2022    Assessment & Plan   1.  Abnormal EKG with second-degree AV block (Mobitz type I and type II) and transient complete heart block noted on EKG that was completed in clinic today, that he is symptomatic to with increasing shortness of breath, chest tightness, and elevated blood pressures.  Advised to discontinue metoprolol to allow for washout.  Advised on holding all AV nodal blocking agents.  It is reasonable in the emergency department and during admission for the placement of pacing pads on or at the bedside.  If continues with high-grade AV block will likely require pacemaker placement after washout of metoprolol.  Of note to Dr. Caryl Comes from EP  is at [facility] tomorrow and should be available for consult if needed.  Associated symptoms of chest tightness and shortness of breath can continue to be evaluated in the emergency department with CXR, serial troponins, BNP, and D-dimer.  2.  Hypertensive urgency blood pressure 206/94.  Recheck of 198/88.  Patient states he has been taking his pressure at home continues to monitor and is noted an uptick in his pressure over the last several days when his symptoms started.  Rate typically runs 128/88 on his current medication regimen he has been running upwards of 737 systolic at home.  Previous home medications of amlodipine 5 mg daily and valsartan 320 mg daily to be continued.  Advised against the use of any AV nodal blocking agents due to high-grade AV block.  3.  Hyperlipidemia with most recent LDL of 50 and triglyceride of 130.  He continues to take simvastatin 20 mg daily without side effects or issues.  4.  Obstructive sleep apnea who is compliant with CPAP.  5.  Disposition patient was taken to the emergency department for further evaluation, workup, and admission to the hospital symptomatic high-grade AV block, chest tightness, worsening shortness of breath.  Ayde Record,  NP 07/27/2022, 4:25 PM

## 2022-07-27 NOTE — ED Notes (Signed)
Placed external catheter at request of Pt's RN. Brought pt blankets. Pt has call bell within reach. No other needs at this time.

## 2022-07-27 NOTE — ED Triage Notes (Signed)
C/O SOB and HTN x 3-5 days.  Referred to ED from heart center

## 2022-07-27 NOTE — ED Provider Notes (Signed)
[facility] Provider Note    Event Date/Time   First MD Initiated Contact with Patient 07/27/22 1655     (approximate)   History   Chief Complaint Bradycardia   HPI  Blake Garcia is a 66 y.o. male with past medical history of hypertension, hyperlipidemia, diabetes, and CHF who presents to the ED complaining of shortness of breath.  Patient reports that he has had 3 to 5 days of increasing difficulty breathing, particularly with exertion but also present at rest.  He will have some tightness in his chest when he exerts himself, also reports a dry cough particularly in the mornings.  He denies any fevers and does not have any chest pain currently.  He has not noticed any pain or swelling in his legs.  He does state he has been feeling lightheaded at times.  He went to see his cardiologist earlier today, was found to be in intermittent complete heart block and referred to the ED for further evaluation.     Physical Exam   Triage Vital Signs: ED Triage Vitals  Enc Vitals Group     BP 07/27/22 1642 (!) 187/81     Pulse Rate 07/27/22 1642 (!) 50     Resp 07/27/22 1642 18     Temp 07/27/22 1642 98.8 F (37.1 C)     Temp Source 07/27/22 1642 Oral     SpO2 07/27/22 1642 93 %     Weight 07/27/22 1620 297 lb 9.9 oz (135 kg)     Height 07/27/22 1620 '5\' 7"'$  (1.702 m)     Head Circumference --      Peak Flow --      Pain Score 07/27/22 1620 0     Pain Loc --      Pain Edu? --      Excl. in [place]? --     Most recent vital signs: Vitals:   07/27/22 1700 07/27/22 1732  BP: (!) 189/79   Pulse: (!) 46 (!) 47  Resp: 15 16  Temp:    SpO2: 91% 90%    Constitutional: Alert and oriented. Eyes: Conjunctivae are normal. Head: Atraumatic. Nose: No congestion/rhinnorhea. Mouth/Throat: Mucous membranes are moist.  Cardiovascular: Bradycardic, regular rhythm. Grossly normal heart sounds.  2+ radial pulses bilaterally. Respiratory: Normal respiratory effort.  No  retractions. Lungs with crackles to bilateral bases. Gastrointestinal: Soft and nontender. No distention. Musculoskeletal: No lower extremity tenderness nor edema.  Neurologic:  Normal speech and language. No gross focal neurologic deficits are appreciated.    ED Results / Procedures / Treatments   Labs (all labs ordered are listed, but only abnormal results are displayed) Labs Reviewed  BASIC METABOLIC PANEL - Abnormal; Notable for the following components:      Result Value   Potassium 3.4 (*)    Glucose, Bld 110 (*)    All other components within normal limits  CBC  BRAIN NATRIURETIC PEPTIDE  MAGNESIUM  BASIC METABOLIC PANEL  CBC  TROPONIN I (HIGH SENSITIVITY)  TROPONIN I (HIGH SENSITIVITY)     EKG  ED ECG REPORT I, Blake Divine, the attending physician, personally viewed and interpreted this ECG.   Date: 07/27/2022  EKG Time: 16:34  Rate: 48  Rhythm: Complete heart block  Axis: LAD  Intervals: RBBB  ST&T Change: None  RADIOLOGY Chest x-ray reviewed and interpreted by me consistent with pulmonary edema, no focal infiltrate noted.  PROCEDURES:  Critical Care performed: Yes, see critical care procedure note(s)  .  Critical Care  Performed by: Blake Divine, MD Authorized by: Blake Divine, MD   Critical care provider statement:    Critical care time (minutes):  30   Critical care time was exclusive of:  Separately billable procedures and treating other patients and teaching time   Critical care was necessary to treat or prevent imminent or life-threatening deterioration of the following conditions:  Cardiac failure   Critical care was time spent personally by me on the following activities:  Development of treatment plan with patient or surrogate, discussions with consultants, evaluation of patient's response to treatment, examination of patient, ordering and review of laboratory studies, ordering and review of radiographic studies, ordering and performing  treatments and interventions, pulse oximetry, re-evaluation of patient's condition and review of old charts   I assumed direction of critical care for this patient from another provider in my specialty: no     Care discussed with: admitting provider      MEDICATIONS ORDERED IN ED: Medications  furosemide (LASIX) injection 40 mg (has no administration in time range)  acetaminophen (TYLENOL) tablet 650 mg (has no administration in time range)    Or  acetaminophen (TYLENOL) suppository 650 mg (has no administration in time range)  ondansetron (ZOFRAN) tablet 4 mg (has no administration in time range)    Or  ondansetron (ZOFRAN) injection 4 mg (has no administration in time range)  heparin injection 5,000 Units (has no administration in time range)  senna-docusate (Senokot-S) tablet 1 tablet (has no administration in time range)  potassium chloride SA (KLOR-CON M) CR tablet 20 mEq (has no administration in time range)     IMPRESSION / MDM / ASSESSMENT AND PLAN / ED COURSE  I reviewed the triage vital signs and the nursing notes.                              66 y.o. male with past medical history of hypertension, hyperlipidemia, diabetes, and CHF who presents to the ED complaining of increasing dyspnea on exertion over the past 3 to 5 days with tightness in his chest on exertion.  Patient's presentation is most consistent with acute presentation with potential threat to life or bodily function.  Differential diagnosis includes, but is not limited to, arrhythmia, ACS, PE, pneumonia, CHF, COPD, electrolyte abnormality, medication effect.  Patient nontoxic-appearing and in no acute distress, vital signs remarkable for bradycardia but patient with stable blood pressure.  Initial EKG concerning for complete heart block but no pacing needed at this time given patient remains hemodynamically stable.  He does take metoprolol, was seen by cardiology earlier today who recommended washout of this  medication and will plan for pacemaker placement if heart rate does not improve.  Labs are unremarkable with no significant anemia, leukocytosis, electrolyte abnormality, or AKI.  Troponin within normal limits and symptoms seem less concerning for ACS.  Patient does have pulmonary edema on chest x-ray, likely has CHF exacerbation as well and we will diurese with IV Lasix.  He is not in any respiratory distress and maintaining oxygen saturations on room air.  Case discussed with hospitalist for admission.      FINAL CLINICAL IMPRESSION(S) / ED DIAGNOSES   Final diagnoses:  Complete heart block (HCC)  Acute on chronic diastolic congestive heart failure ([place])     Rx / DC Orders   ED Discharge Orders     None        Note:  This  document was prepared using Systems analyst and may include unintentional dictation errors.   Blake Divine, MD 07/27/22 1754

## 2022-07-27 NOTE — Assessment & Plan Note (Signed)
-   This complicates overall care and prognosis.  

## 2022-07-27 NOTE — Patient Instructions (Signed)
Follow-Up: At [facility], you and your health needs are our priority.  As part of our continuing mission to provide you with exceptional heart care, we have created designated Provider Care Teams.  These Care Teams include your primary Cardiologist (physician) and Advanced Practice Providers (APPs -  Physician Assistants and Nurse Practitioners) who all work together to provide you with the care you need, when you need it.  We recommend signing up for the patient portal called "MyChart".  Sign up information is provided on this After Visit Summary.  MyChart is used to connect with patients for Virtual Visits (Telemedicine).  Patients are able to view lab/test results, encounter notes, upcoming appointments, etc.  Non-urgent messages can be sent to your provider as well.   To learn more about what you can do with MyChart, go to [email or web address].    Your next appointment:    Patient instructed to go to ED follow up to be determined post ED visit.       Important Information About Sugar

## 2022-07-27 NOTE — Assessment & Plan Note (Addendum)
-   Patient takes amlodipine 5 mg daily, metoprolol tartrate 25 mg twice daily, valsartan 320 mg p.o. daily - Hydralazine 5 mg IV every 8 hours as needed for SBP greater than 180, 3 days ordered.  Pressure is elevated due to acute heart failure.  Diuresing.

## 2022-07-27 NOTE — ED Notes (Addendum)
Pt to ED from cardiologist office. Pt reports feeling off with increased SOB x3-5days. Pt states BP has been increased and noticed HR has been in the 40s with home monitoring. Pt EKG showing possible 3rd degree heart block per EDP. Pt A&Ox4. Pt placed on zoll pads at this time and monitor.

## 2022-07-27 NOTE — ED Notes (Signed)
Pt going monitored to CT with RN.

## 2022-07-27 NOTE — Assessment & Plan Note (Signed)
Simvastatin 20 mg nightly resumed 

## 2022-07-27 NOTE — Assessment & Plan Note (Addendum)
Cardiology following. Metoprolol to washout.  Home metoprolol held.  CTA ruled out PE.  Curiously, dose has not been changed anytime recently.  Thyroid function studies ordered.  It suspected that likely patient will need permanent pacemaker.

## 2022-07-27 NOTE — H&P (Addendum)
History and Physical   [name] EXH:[id number] DOB: [birth year] DOA: 07/27/2022  PCP: Jinny Sanders, MD  Outpatient Specialists: [facility] cardiology Patient coming from: Shortness of breath  I have personally briefly reviewed patient's old medical records in [place].  Chief Concern: shortness of breath  HPI: Mr. Blake Garcia is a 66 year old male with history of heart failure preserved ejection fraction, COPD, morbid obesity, who presents emergency department for chief concerns of shortness of breath.  Patient called his cardiology office stating that he has been short of breath for the last 3-5 days.  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  Initial vitals in the ED showed temperature of 98.8, respiration rate of 15, heart rate of 57, blood pressure 198/88, currently improved to 171/84, SpO2 of 91% on room air.  Serum sodium is 140, potassium 2.5, chloride 105, bicarb 28, BUN of 14, serum creatinine of 0.84, EGFR greater than 60, nonfasting blood glucose 110, WBC 7.5, hemoglobin 14.7, platelets of 270.  High sensitive troponin was 13.  ED treatment: Furosemide 40 mg IV one-time dose. --------------------------------------- At bedside, he is able to tell me his name, age, current location, current calendar year.  He endorses chest pain, tightness in his chest with exertion and with rest. He reports the chest pain has been ongoing for the last 48 hours.  He denies arm numbness and jaw discomfort.  He has been having shortness of breath for 3-5 days. He denies fever, known sick contacts, abdominal pain, dysuria, hematuria, diarrhea.  He denies trauma to his person.  He reports that since 2021 he has lost over 200 pounds in an effort to try to lose weight.  Social history: He lives with a significant other, [name]. He denies  tobacco, etoh, recreational drug. He is retired and formerly worked as a Occupational psychologist for [employer].  ROS: Constitutional: no weight change, no fever ENT/Mouth: no sore throat, no rhinorrhea Eyes: no eye pain, no vision changes Cardiovascular: + chest pain, + dyspnea,  no edema, no palpitations Respiratory: + cough, + sputum, no wheezing Gastrointestinal: no nausea, no vomiting, no diarrhea, no constipation Genitourinary: no urinary incontinence, no dysuria, no hematuria Musculoskeletal: no arthralgias, no myalgias Skin: no skin lesions, no pruritus, Neuro: + weakness, no loss of consciousness, no syncope Psych: no anxiety, no depression, no decrease appetite Heme/Lymph: no bruising, no bleeding  ED Course: Discussed with emergency medicine provider, patient requiring hospitalization for chief concerns of complete heart block.  Assessment/Plan  Principal Problem:   Complete heart block (HCC) Active Problems:   Hyperlipidemia associated with type 2 diabetes mellitus (HCC)   METABOLIC SYNDROME X   Obesity, Class III, BMI 40-49.9 (morbid obesity) ([place])   Major depressive disorder, recurrent episode, severe (HCC)   Obstructive sleep apnea   Diastolic heart failure ([place])   Hypertension associated with diabetes ([place])   Assessment and Plan:  * Complete heart block ([place]) - Hold home metoprolol twice daily; patient reports that his most recent dose was a.m. on day of admission - Avoid AV nodal blocking agents - Per [facility] outpatient note, Dr. Caryl Comes, EP will be at [facility] and should be available for consultation if needed - Complete echo ordered - Continue to follow high sensitive troponin - Check D-dimer - Continue pads in place - Admit to PCU, inpatient  Hypertension associated with diabetes ([place]) - Patient takes amlodipine 5  mg daily, metoprolol tartrate 25 mg twice daily, valsartan 320 mg p.o. daily - Hydralazine 5 mg IV every 8 hours as needed for SBP greater than 180, 3 days  ordered  Obstructive sleep apnea - CPAP nightly ordered  Major depressive disorder, recurrent episode, severe (HCC) - Home venlafaxine 75 mg daily, not resumed on admission due to postmarketing report adverse affect  Obesity, Class III, BMI 40-49.9 (morbid obesity) ([place]) - This complicates overall care and prognosis.   Hyperlipidemia associated with type 2 diabetes mellitus (HCC) - Simvastatin 20 mg nightly resumed  QT prolongation-avoid scheduled QT prolongation medications  Increased urination from IV furosemide-given patient's short of breath, condom catheter order placed to reduce exertion and risk of cauti  Chart reviewed.   DVT prophylaxis: Heparin 5000 units subcutaneous every 8 hours Code Status: Full code Diet: Heart healthy Family Communication: No Disposition Plan: Pending course Consults called: none  at this time, a.m. team to consult cardiology if indicated Admission status: Progressive cardiac, inpatient  Past Medical History:  Diagnosis Date   Basal cell carcinoma [id number]    [facility]   Cellulitis 7/61/6073   RLE   Complication of anesthesia ~ 2000   "during OR for kidney stones; anesthesia RX made me code twice in one week"   Depression    Diastolic dysfunction    a. 11/2014 Echo: EF 50-55%, Gr 1 DD.   History of blood transfusion 1958   "I was an Rh baby"   History of kidney stones    Hyperlipidemia    Hypertension    Kidney stones    Midsternal chest pain    Morbid obesity ([place])    On home oxygen therapy    OSA (obstructive sleep apnea)    "wear CPAP"   Personal history of colonic adenoma 09/14/2012   Pre-diabetes    Past Surgical History:  Procedure Laterality Date   CHOLECYSTECTOMY  1993   COLONOSCOPY     COLONOSCOPY WITH PROPOFOL N/A 07/28/2020   Procedure: COLONOSCOPY WITH PROPOFOL;  Surgeon: Gatha Mayer, MD;  Location: [facility] ENDOSCOPY;  Service: Endoscopy;  Laterality: N/A;   ESOPHAGOGASTRODUODENOSCOPY     KNEE ARTHROSCOPY   ~ 2004   right   LAPAROSCOPY     Surgical sleeve   LITHOTRIPSY  2000   POLYPECTOMY  07/28/2020   Procedure: POLYPECTOMY;  Surgeon: Gatha Mayer, MD;  Location: [facility] ENDOSCOPY;  Service: Endoscopy;;   Social History:  reports that he has never smoked. He has never used smokeless tobacco. He reports that he does not drink alcohol and does not use drugs.  No Known Allergies Family History  Problem Relation Age of Onset   Hypertension Mother    Heart disease Mother    Heart disease Father    Stroke Father    Heart disease Brother    Coronary artery disease Brother    Diabetes Brother    Cancer Maternal Aunt        breast   Cancer Maternal Uncle        lung   Heart disease Brother    Coronary artery disease Brother    ADD / ADHD Son    ADD / ADHD Son    ADD / ADHD Daughter    Family history: Family history reviewed and not pertinent  Prior to Admission medications   Medication Sig Start Date End Date Taking? Authorizing Provider  amLODipine (NORVASC) 5 MG tablet TAKE 1 TABLET BY MOUTH ONCE DAILY 02/02/22   Kathlyn Sacramento  A, MD  Calcium Carb-Cholecalciferol (CALCIUM 500 + D PO) Take 3 tablets by mouth in the morning and at bedtime. 11/03/20   [provider]  metoprolol tartrate (LOPRESSOR) 25 MG tablet TAKE 1 TABLET BY MOUTH EVERY 12 HOURS 03/22/22   Wellington Hampshire, MD  Multiple Vitamins-Minerals (BARIATRIC MULTIVITAMINS/IRON) CAPS Take 1 capsule by mouth daily.    [provider]  nystatin (MYCOSTATIN/NYSTOP) powder Apply topically 2 (two) times daily as needed. 05/18/21   [provider]  nystatin-triamcinolone (MYCOLOG II) cream Apply 1 application topically 2 (two) times daily. 06/15/21   Bedsole, Siara Gorder E, MD  potassium chloride SA (KLOR-CON M) 20 MEQ tablet TAKE ONE AND A HALF TABLETS BY MOUTH EVERY DAY 03/22/22   Bedsole, Gracelyn Coventry E, MD  simvastatin (ZOCOR) 20 MG tablet TAKE ONE TABLET AT BEDTIME 12/21/21   Bedsole, Kinzlie Harney E, MD  triamcinolone cream (KENALOG)  0.1 % APPLY TO AFFECTED AREAS TWICE A DAY IF NEEDED 05/03/22   Bedsole, Charlies Rayburn E, MD  valsartan (DIOVAN) 320 MG tablet TAKE 1 TABLET BY MOUTH ONCE DAILY 02/02/22   Wellington Hampshire, MD  venlafaxine XR (EFFEXOR-XR) 75 MG 24 hr capsule TAKE THREE CAPSULES DAILY 04/11/22   Jinny Sanders, MD   Physical Exam: Vitals:   07/27/22 1800 07/27/22 1815 07/27/22 1830 07/27/22 1900  BP: (!) 195/81  (!) 175/81 (!) 181/78  Pulse: (!) 46 (!) 44 (!) 45 (!) 45  Resp: (!) 21 (!) 21 (!) 25 (!) 28  Temp:      TempSrc:      SpO2: 91% 92% 95% 93%  Weight:      Height:       Constitutional: appears age-appropriate, NAD, calm, comfortable Eyes: PERRL, lids and conjunctivae normal ENMT: Mucous membranes are moist. Posterior pharynx clear of any exudate or lesions. Age-appropriate dentition. Hearing appropriate Neck: normal, supple, no masses, no thyromegaly Respiratory: clear to auscultation bilaterally, no wheezing, no crackles. Normal respiratory effort. No accessory muscle use.  Cardiovascular: Regular rate and rhythm, no murmurs / rubs / gallops. No extremity edema. 2+ pedal pulses. No carotid bruits.  Abdomen: Morbidly obese,  no tenderness, no masses palpated, no hepatosplenomegaly. Bowel sounds positive.  Musculoskeletal: no clubbing / cyanosis. No joint deformity upper and lower extremities. Good ROM, no contractures, no atrophy. Normal muscle tone.  Skin: no rashes, lesions, ulcers. No induration Neurologic: Sensation intact. Strength 5/5 in all 4.  Psychiatric: Normal judgment and insight. Alert and oriented x 3. Normal mood.   EKG: independently reviewed, showing heart block with rate of 48, QTc 523  Chest x-ray on Admission: I personally reviewed and I agree with radiologist reading as below.  DG Chest 2 View  Result Date: 07/27/2022 CLINICAL DATA:  Bradycardia, chest pain EXAM: CHEST - 2 VIEW COMPARISON:  02/12/2012 FINDINGS: Normal cardiac silhouette. Interval increase in venous congestion and  interstitial edema pattern. Small bilateral pleural effusion. No focal consolidation. IMPRESSION: Increasing interstitial edema and effusions. Electronically Signed   By: Suzy Bouchard M.D.   On: 07/27/2022 16:51    Labs on Admission: I have personally reviewed following labs CBC: Recent Labs  Lab 07/27/22 1642  WBC 7.5  HGB 14.7  HCT 44.4  MCV 90.6  PLT 161   Basic Metabolic Panel: Recent Labs  Lab 07/27/22 1642  NA 140  K 3.4*  CL 105  CO2 28  GLUCOSE 110*  BUN 14  CREATININE 0.84  CALCIUM 8.9   GFR: Estimated Creatinine Clearance: 116.2 mL/min (by C-G  formula based on SCr of 0.84 mg/dL).  Urine analysis:    Component Value Date/Time   COLORURINE AMBER (A) 02/09/2012 1851   APPEARANCEUR HAZY (A) 02/09/2012 1851   LABSPEC 1.027 02/09/2012 1851   PHURINE 5.0 02/09/2012 1851   GLUCOSEU NEGATIVE 02/09/2012 1851   HGBUR NEGATIVE 02/09/2012 1851   BILIRUBINUR neg 10/11/2013 1130   KETONESUR 15 (A) 02/09/2012 1851   PROTEINUR trace 10/11/2013 1130   PROTEINUR 30 (A) 02/09/2012 1851   UROBILINOGEN 0.2 10/11/2013 1130   UROBILINOGEN 4.0 (H) 02/09/2012 1851   NITRITE neg 10/11/2013 1130   NITRITE POSITIVE (A) 02/09/2012 1851   LEUKOCYTESUR Trace 10/11/2013 1130   CRITICAL CARE Performed by: Dr. Tobie Poet  Total critical care time: 35 minutes  Critical care time was exclusive of separately billable procedures and treating other patients.  Critical care was necessary to treat or prevent imminent or life-threatening deterioration.  Critical care was time spent personally by me on the following activities: development of treatment plan with patient and/or surrogate as well as nursing, discussions with consultants, evaluation of patient's response to treatment, examination of patient, obtaining history from patient or surrogate, ordering and performing treatments and interventions, ordering and review of laboratory studies, ordering and review of radiographic studies, pulse  oximetry and re-evaluation of patient's condition.  This document was prepared using Dragon Voice Recognition software and may include unintentional dictation errors.  Dr. Tobie Poet [facility]  If 7PM-7AM, please contact overnight-coverage provider If 7AM-7PM, please contact day coverage provider [email or web address]  07/27/2022, 7:29 PM

## 2022-07-27 NOTE — Telephone Encounter (Signed)
The patient called in with complaints of shortness of breath when ambulating and at rest. He stated that this has been going on for 3-5 days. He denies swelling and chest pain. His blood pressure has also been increasing as well. After medications, his blood pressures have been in the 170/90's with heart rates in the 40's. He was not audibly short of breath while on the phone.   Medications: Valsartan 320 mg Metoprolol Tartrate 25 mg twice daily Amlodipine 5 mg, takes in the evening  Appointment made for today at 3:10.

## 2022-07-27 NOTE — Assessment & Plan Note (Addendum)
-   Home venlafaxine 75 mg daily, not resumed on admission due to postmarketing report adverse affect

## 2022-07-27 NOTE — Telephone Encounter (Signed)
Pt c/o Shortness Of Breath: STAT if SOB developed within the last 24 hours or pt is noticeably SOB on the phone  1. Are you currently SOB (can you hear that pt is SOB on the phone)? No   2. How long have you been experiencing SOB? 2 to 3 days  3. Are you SOB when sitting or when up moving around? Both   4. Are you currently experiencing any other symptoms? BP is 170/98; 40; lightheadedness, headache

## 2022-07-28 ENCOUNTER — Inpatient Hospital Stay (HOSPITAL_COMMUNITY)
Admit: 2022-07-28 | Discharge: 2022-07-28 | Disposition: A | Discharge status: Home / Self Care | Payer: Medicare Other | Attending: Internal Medicine | Admitting: Internal Medicine

## 2022-07-28 DIAGNOSIS — E876 Hypokalemia: Secondary | ICD-10-CM | POA: Diagnosis present

## 2022-07-28 DIAGNOSIS — I442 Atrioventricular block, complete: Secondary | ICD-10-CM | POA: Diagnosis not present

## 2022-07-28 DIAGNOSIS — I10 Essential (primary) hypertension: Secondary | ICD-10-CM

## 2022-07-28 DIAGNOSIS — R0609 Other forms of dyspnea: Secondary | ICD-10-CM

## 2022-07-28 DIAGNOSIS — I5033 Acute on chronic diastolic (congestive) heart failure: Secondary | ICD-10-CM | POA: Diagnosis not present

## 2022-07-28 DIAGNOSIS — G4733 Obstructive sleep apnea (adult) (pediatric): Secondary | ICD-10-CM

## 2022-07-28 LAB — CBC
HCT: 39.9 % (ref 39.0–52.0)
Hemoglobin: 13.1 g/dL (ref 13.0–17.0)
MCH: 29.8 pg (ref 26.0–34.0)
MCHC: 32.8 g/dL (ref 30.0–36.0)
MCV: 90.9 fL (ref 80.0–100.0)
Platelets: 226 10*3/uL (ref 150–400)
RBC: 4.39 MIL/uL (ref 4.22–5.81)
RDW: 13.9 % (ref 11.5–15.5)
WBC: 7.2 10*3/uL (ref 4.0–10.5)
nRBC: 0 % (ref 0.0–0.2)

## 2022-07-28 LAB — BASIC METABOLIC PANEL
Anion gap: 8 (ref 5–15)
Anion gap: 7 (ref 5–15)
BUN: 12 mg/dL (ref 8–23)
BUN: 14 mg/dL (ref 8–23)
CO2: 27 mmol/L (ref 22–32)
CO2: 29 mmol/L (ref 22–32)
Calcium: 8.4 mg/dL — ABNORMAL LOW (ref 8.9–10.3)
Calcium: 8.6 mg/dL — ABNORMAL LOW (ref 8.9–10.3)
Chloride: 104 mmol/L (ref 98–111)
Chloride: 101 mmol/L (ref 98–111)
Creatinine, Ser: 0.7 mg/dL (ref 0.61–1.24)
Creatinine, Ser: 0.83 mg/dL (ref 0.61–1.24)
GFR, Estimated: >60 mL/min (ref >60)
GFR, Estimated: >60 mL/min (ref >60)
Glucose, Bld: 100 mg/dL — ABNORMAL HIGH (ref 70–99)
Glucose, Bld: 116 mg/dL — ABNORMAL HIGH (ref 70–99)
Potassium: 3.1 mmol/L — ABNORMAL LOW (ref 3.5–5.1)
Potassium: 3.6 mmol/L (ref 3.5–5.1)
Sodium: 139 mmol/L (ref 135–145)
Sodium: 137 mmol/L (ref 135–145)

## 2022-07-28 LAB — ECHOCARDIOGRAM COMPLETE
AR max vel: 2.26 cm2
AV Area VTI: 1.92 cm2
AV Area mean vel: 1.97 cm2
AV Mean grad: 7 mmHg
AV Peak grad: 12.7 mmHg
Ao pk vel: 1.78 m/s
Area-P 1/2: 4.26 cm2
Height: 67 in
MV M vel: 5.3 m/s
MV Peak grad: 112.4 mmHg
MV VTI: 1.64 cm2
S' Lateral: 3.6 cm
Weight: 4761.94 oz

## 2022-07-28 LAB — MAGNESIUM: Magnesium: 2 mg/dL (ref 1.7–2.4)

## 2022-07-28 MED ORDER — AMLODIPINE BESYLATE 5 MG PO TABS
5.0000 mg | ORAL_TABLET | Freq: Every day | ORAL | Status: DC
  Administered 2022-07-28: 5 mg via ORAL
  Filled 2022-07-28: qty 1

## 2022-07-28 MED ORDER — FUROSEMIDE 10 MG/ML IJ SOLN
40.0000 mg | Freq: Once | INTRAMUSCULAR | Status: AC
  Administered 2022-07-28: 40 mg via INTRAVENOUS
  Filled 2022-07-28: qty 4

## 2022-07-28 MED ORDER — FUROSEMIDE 10 MG/ML IJ SOLN
40.0000 mg | Freq: Every day | INTRAMUSCULAR | Status: DC
  Administered 2022-07-28: 40 mg via INTRAVENOUS
  Filled 2022-07-28 (×2): qty 4

## 2022-07-28 MED ORDER — IRBESARTAN 150 MG PO TABS
300.0000 mg | ORAL_TABLET | Freq: Every day | ORAL | Status: DC
  Administered 2022-07-28: 300 mg via ORAL
  Filled 2022-07-28: qty 2

## 2022-07-28 MED ORDER — POTASSIUM CHLORIDE CRYS ER 20 MEQ PO TBCR
40.0000 meq | EXTENDED_RELEASE_TABLET | Freq: Once | ORAL | Status: DC
  Filled 2022-07-28: qty 2

## 2022-07-28 MED ORDER — POTASSIUM CHLORIDE CRYS ER 20 MEQ PO TBCR
80.0000 meq | EXTENDED_RELEASE_TABLET | Freq: Once | ORAL | Status: AC
  Administered 2022-07-28: 80 meq via ORAL
  Filled 2022-07-28: qty 4

## 2022-07-28 MED ORDER — HYDRALAZINE HCL 25 MG PO TABS
25.0000 mg | ORAL_TABLET | Freq: Three times a day (TID) | ORAL | Status: DC
  Administered 2022-07-28 – 2022-07-29 (×3): 25 mg via ORAL
  Filled 2022-07-28 (×3): qty 1

## 2022-07-28 MED ORDER — POTASSIUM CHLORIDE 10 MEQ/100ML IV SOLN
10.0000 meq | INTRAVENOUS | Status: AC
  Administered 2022-07-28 – 2022-07-29 (×3): 10 meq via INTRAVENOUS
  Filled 2022-07-28 (×2): qty 100

## 2022-07-28 NOTE — Assessment & Plan Note (Signed)
Confirmed with echocardiogram.  In acute heart failure due to exacerbation from AV block.  Diuresing.

## 2022-07-28 NOTE — Consult Note (Signed)
Cardiology Consultation   Patient ID: Blake Garcia MRN: [id number]; DOB: July 22, 1956  Admit date: 07/27/2022 Date of Consult: 07/28/2022  PCP:  Jinny Sanders, MD   [place] [facility] Cardiologist:  Kathlyn Sacramento, MD   {   Patient Profile:   Blake Garcia is a 66 y.o. male with a hx of HTN, HLD, OSA on CPAP, DM2, chronic diastolic heart failure who is being seen 07/28/2022 for the evaluation of shortness of breath at the request of Dr. [place] Pink.  History of Present Illness:   [name] had an echo in 10/2019 which showed LVEF 55%, LV with hyperdynamic function, severe LVH, no valvular abnormalities noted. Heart monitor in 09/2019 showed average heart rate of 99bpm, 3 episodes of WCT with the longest lasting 15 beats and 4 runs of SVT, occasional PVCs with 2.6% burden. He was seen by EP and started on BB therapy. He underwent gastric sleeve surgery March 2022 but had mild hypertension and thus the surgery was not completed. He returned back in June 2022 and did well. At his heaviest he weight 550lbs, he was 320.2 lbs on his last visit.   On 07/27/22 he called into the triage line for shortness of breath, lower leg edema and elevated blood pressures. He was seen in the clinic and BP was severely elevated. He reports chest tightness with shortness of breath. He noted heart rates into the 40s at home. EKG showed second degree AV block type I and II and transient complete heart block. He reported taking metoprolol that am. He was sent to the ER for further work-up.   In the ER BP 187/81, pulse 50bpm, afebrile, 93%O2. Labs showed potassium 3.4. WBC 7.5, Hgb 14.7. HS trop 13>15. Mag 2. BNP 650. D-dimer 0.84. EKG showed SB, 48bpm with second degree AV block type I and II. CXR showed increasing interstitial edema and effusions. Chest CTA showed no PE. The patient was given IV lasix and admitted for further work-up.    Past Medical History:  Diagnosis Date   Basal cell  carcinoma [id number]   [facility]   Cellulitis 11/08/7351   RLE   Complication of anesthesia ~ 2000   "during OR for kidney stones; anesthesia RX made me code twice in one week"   Depression    Diastolic dysfunction    a. 11/2014 Echo: EF 50-55%, Gr 1 DD.   History of blood transfusion 1958   "I was an Rh baby"   History of kidney stones    Hyperlipidemia    Hypertension    Kidney stones    Midsternal chest pain    Morbid obesity ([place])    On home oxygen therapy    OSA (obstructive sleep apnea)    "wear CPAP"   Personal history of colonic adenoma 09/14/2012   Pre-diabetes     Past Surgical History:  Procedure Laterality Date   CHOLECYSTECTOMY  1993   COLONOSCOPY     COLONOSCOPY WITH PROPOFOL N/A 07/28/2020   Procedure: COLONOSCOPY WITH PROPOFOL;  Surgeon: Gatha Mayer, MD;  Location: [facility]COPY;  Service: Endoscopy;  Laterality: N/A;   ESOPHAGOGASTRODUODENOSCOPY     KNEE ARTHROSCOPY  ~ 2004   right   LAPAROSCOPY     Surgical sleeve   LITHOTRIPSY  2000   POLYPECTOMY  07/28/2020   Procedure: POLYPECTOMY;  Surgeon: Gatha Mayer, MD;  Location: [facility];  Service: Endoscopy;;     Home Medications:  Prior to Admission medications  Medication Sig Start Date End Date Taking? Authorizing Provider  amLODipine (NORVASC) 5 MG tablet TAKE 1 TABLET BY MOUTH ONCE DAILY 02/02/22  Yes Wellington Hampshire, MD  Calcium Carb-Cholecalciferol (CALCIUM 500 + D PO) Take 3 tablets by mouth in the morning and at bedtime. 11/03/20  Yes [provider]  metoprolol tartrate (LOPRESSOR) 25 MG tablet TAKE 1 TABLET BY MOUTH EVERY 12 HOURS 03/22/22  Yes Wellington Hampshire, MD  Multiple Vitamins-Minerals (BARIATRIC MULTIVITAMINS/IRON) CAPS Take 1 capsule by mouth daily.   Yes [provider]  potassium chloride SA (KLOR-CON M) 20 MEQ tablet TAKE ONE AND A HALF TABLETS BY MOUTH EVERY DAY 03/22/22  Yes Bedsole, Amy E, MD  simvastatin (ZOCOR) 20 MG tablet TAKE ONE TABLET AT  BEDTIME 12/21/21  Yes Bedsole, Amy E, MD  valsartan (DIOVAN) 320 MG tablet TAKE 1 TABLET BY MOUTH ONCE DAILY 02/02/22  Yes Wellington Hampshire, MD  venlafaxine XR (EFFEXOR-XR) 75 MG 24 hr capsule TAKE THREE CAPSULES DAILY 04/11/22  Yes Bedsole, Amy E, MD  nystatin (MYCOSTATIN/NYSTOP) powder Apply topically 2 (two) times daily as needed. 05/18/21   [provider]  nystatin-triamcinolone (MYCOLOG II) cream Apply 1 application topically 2 (two) times daily. 06/15/21   Bedsole, Amy E, MD  triamcinolone cream (KENALOG) 0.1 % APPLY TO AFFECTED AREAS TWICE A DAY IF NEEDED 05/03/22   Bedsole, Amy E, MD    Inpatient Medications: Scheduled Meds:  amLODipine  5 mg Oral Daily   heparin  5,000 Units Subcutaneous Q8H   irbesartan  300 mg Oral Daily   simvastatin  20 mg Oral QHS   Continuous Infusions:  PRN Meds: acetaminophen **OR** acetaminophen, hydrALAZINE, nystatin, ondansetron **OR** ondansetron (ZOFRAN) IV, senna-docusate  Allergies:   No Known Allergies  Social History:   Social History   Socioeconomic History   Marital status: Divorced    Spouse name: Not on file   Number of children: 3   Years of education: Not on file   Highest education level: Some college, no degree  Occupational History   Occupation: not employed  Tobacco Use   Smoking status: Never   Smokeless tobacco: Never  Vaping Use   Vaping Use: Never used  Substance and Sexual Activity   Alcohol use: No    Alcohol/week: 0.0 standard drinks of alcohol    Comment: 02/09/12 "may have driink at party once/year"   Drug use: No   Sexual activity: Not Currently  Other Topics Concern   Not on file  Social History Narrative   Uses a scooter for ambulation   He is disabled   He is divorced and has 3 children   No alcohol never smoker no drug use no current tobacco   Social Determinants of Health   Financial Resource Strain: Low Risk  (03/09/2022)   Overall Financial Resource Strain (CARDIA)    Difficulty of Paying  Living Expenses: Not hard at all  Food Insecurity: No Food Insecurity (03/09/2022)   Hunger Vital Sign    Worried About Running Out of Food in the Last Year: Never true    Ran Out of Food in the Last Year: Never true  Transportation Needs: No Transportation Needs (03/09/2022)   PRAPARE - Hydrologist (Medical): No    Lack of Transportation (Non-Medical): No  Physical Activity: Sufficiently Active (03/09/2022)   Exercise Vital Sign    Days of Exercise per Week: 4 days    Minutes of Exercise per Session: 60 min  Stress: No Stress Concern Present (03/09/2022)   [place]    Feeling of Stress : Not at all  Social Connections: Moderately Integrated (03/09/2022)   Social Connection and Isolation Panel [NHANES]    Frequency of Communication with Friends and Family: More than three times a week    Frequency of Social Gatherings with Friends and Family: Once a week    Attends Religious Services: More than 4 times per year    Active Member of [employer] or Organizations: Yes    Attends Music therapist: More than 4 times per year    Marital Status: Divorced  Intimate Partner Violence: Not At Risk (03/09/2022)   Humiliation, Afraid, Rape, and Kick questionnaire    Fear of Current or Ex-Partner: No    Emotionally Abused: No    Physically Abused: No    Sexually Abused: No    Family History:    Family History  Problem Relation Age of Onset   Hypertension Mother    Heart disease Mother    Heart disease Father    Stroke Father    Heart disease Brother    Coronary artery disease Brother    Diabetes Brother    Cancer Maternal Aunt        breast   Cancer Maternal Uncle        lung   Heart disease Brother    Coronary artery disease Brother    ADD / ADHD Son    ADD / ADHD Son    ADD / ADHD Daughter      ROS:  Please see the history of present illness.   All other ROS reviewed and negative.      Physical Exam/Data:   Vitals:   07/28/22 0400 07/28/22 0530 07/28/22 0600 07/28/22 0630  BP: (!) 172/99 (!) 174/82 (!) 177/88 (!) 177/84  Pulse: (!) 43 (!) 47 (!) 42 (!) 44  Resp: (!) 21 (!) 23 (!) 23 20  Temp: 98.3 F (36.8 C) 98.6 F (37 C)    TempSrc:      SpO2: 95% 96% 95% 94%  Weight:      Height:        Intake/Output Summary (Last 24 hours) at 07/28/2022 0834 Last data filed at 07/28/2022 0552 Gross per 24 hour  Intake --  Output 3900 ml  Net -3900 ml      07/27/2022    4:20 PM 07/27/2022    3:09 PM 06/28/2022   10:36 AM  Last 3 Weights  Weight (lbs) 297 lb 9.9 oz 298 lb 310 lb 2 oz  Weight (kg) 135 kg 135.172 kg 140.672 kg     Body mass index is 46.61 kg/m.  General:  Well nourished, well developed, in no acute distress HEENT: normal Neck: no JVD Vascular: No carotid bruits; Distal pulses 2+ bilaterally Cardiac:  normal S1, S2; bradycardic; no murmur  Lungs:  diminished at bases  Abd: soft, nontender, no hepatomegaly  Ext: trace lower edema Musculoskeletal:  No deformities, BUE and BLE strength normal and equal Skin: warm and dry  Neuro:  CNs 2-12 intact, no focal abnormalities noted Psych:  Normal affect   EKG:  The EKG was personally reviewed and demonstrates:  SB 48bpm, second degree type I and II, PVCs, RBBB, LAD Telemetry:  Telemetry was personally reviewed and demonstrates:  SB HR upper 40s and 50s appears to be second degree type I and II heart block, PVCs  Relevant CV Studies:  Echo 10/2019  1. Left ventricular ejection fraction, by estimation, is >55%. The left  ventricle has hyperdynamic function. Left ventricular endocardial border  not optimally defined to evaluate regional wall motion. There is severe  left ventricular hypertrophy. Left  ventricular diastolic function could not be evaluated.   2. Right ventricular systolic function was not well visualized. The right  ventricular size is not well visualized.   3. The mitral valve was  not well visualized. No evidence of mitral valve  regurgitation.   4. The aortic valve was not well visualized. Aortic valve regurgitation  not well assessed.   5. Pulmonic valve regurgitation not well assessed.   Laboratory Data:  High Sensitivity Troponin:   Recent Labs  Lab 07/27/22 1642 07/27/22 1848  TROPONINIHS 13 15     Chemistry Recent Labs  Lab 07/27/22 1642 07/27/22 1848 07/28/22 0551  NA 140  --  139  K 3.4*  --  3.1*  CL 105  --  104  CO2 28  --  27  GLUCOSE 110*  --  100*  BUN 14  --  12  CREATININE 0.84  --  0.70  CALCIUM 8.9  --  8.4*  MG  --  2.0  --   GFRNONAA >60  --  >60  ANIONGAP 7  --  8    No results for input(s): "PROT", "ALBUMIN", "AST", "ALT", "ALKPHOS", "BILITOT" in the last 168 hours. Lipids No results for input(s): "CHOL", "TRIG", "HDL", "LABVLDL", "LDLCALC", "CHOLHDL" in the last 168 hours.  Hematology Recent Labs  Lab 07/27/22 1642 07/28/22 0551  WBC 7.5 7.2  RBC 4.90 4.39  HGB 14.7 13.1  HCT 44.4 39.9  MCV 90.6 90.9  MCH 30.0 29.8  MCHC 33.1 32.8  RDW 14.1 13.9  PLT 270 226   Thyroid No results for input(s): "TSH", "FREET4" in the last 168 hours.  BNP Recent Labs  Lab 07/27/22 1848  BNP 650.3*    DDimer  Recent Labs  Lab 07/27/22 1926  DDIMER 0.84*     Radiology/Studies:  CT Angio Chest Pulmonary Embolism (PE) W or WO Contrast  Result Date: 07/27/2022 CLINICAL DATA:  Positive D-dimer, bradycardia, chest pain EXAM: CT ANGIOGRAPHY CHEST WITH CONTRAST TECHNIQUE: Multidetector CT imaging of the chest was performed using the standard protocol during bolus administration of intravenous contrast. Multiplanar CT image reconstructions and MIPs were obtained to evaluate the vascular anatomy. RADIATION DOSE REDUCTION: This exam was performed according to the departmental dose-optimization program which includes automated exposure control, adjustment of the mA and/or kV according to patient size and/or use of iterative  reconstruction technique. CONTRAST:  189m OMNIPAQUE IOHEXOL 350 MG/ML SOLN COMPARISON:  07/27/2022 FINDINGS: Cardiovascular: This is a technically adequate evaluation of the pulmonary vasculature. Evaluation of the upper lung zones is limited due to respiratory motion. No filling defects or pulmonary emboli. The heart is unremarkable without pericardial effusion. No evidence of thoracic aortic aneurysm or dissection. Atherosclerosis of the aorta and coronary vasculature, greatest in the LAD distribution. Mediastinum/Nodes: Thyroid, trachea, and esophagus are unremarkable. Borderline enlarged mediastinal lymph nodes are nonspecific, largest measuring 12 mm in the pretracheal region. Lungs/Pleura: Small bilateral pleural effusions, volume estimated less than 1 L each. Dependent bilateral lower lobe atelectasis. Central vascular congestion. No airspace disease or pneumothorax. The central airways are patent. Upper Abdomen: No acute abnormality. Musculoskeletal: No acute or destructive bony lesions. Reconstructed images demonstrate no additional findings. Review of the MIP images confirms the above findings. IMPRESSION: 1. No evidence of  pulmonary embolus. 2. Central vascular congestion and bilateral pleural effusions. 3. Aortic Atherosclerosis (ICD10-I70.0). Coronary artery atherosclerosis. 4. Borderline enlarged mediastinal lymph nodes, likely reactive. Electronically Signed   By: Randa Ngo M.D.   On: 07/27/2022 21:00   DG Chest 2 View  Result Date: 07/27/2022 CLINICAL DATA:  Bradycardia, chest pain EXAM: CHEST - 2 VIEW COMPARISON:  02/12/2012 FINDINGS: Normal cardiac silhouette. Interval increase in venous congestion and interstitial edema pattern. Small bilateral pleural effusion. No focal consolidation. IMPRESSION: Increasing interstitial edema and effusions. Electronically Signed   By: Suzy Bouchard M.D.   On: 07/27/2022 16:51     Assessment and Plan:   High grade heart block - patient was sent  from the office 12/27 for second degree type I and II, with transient third degree heart block, complaining of SOB and chest pain - TSH and MAG wnl - replete K - ddimer elevated>Chest CTA negative for PE - last dose of metoprolol 12/27 in the am - plan for BB washout x 48 hours - still bradycardic with HR upper 40s with second degree type I and II - patient reports dizziness and lightheadedness - pacer pads in place - echo ordered - EP made aware of consult  Hypertensive Urgency - BP 206/94 in the office 12/27 - restart amlodipine and Valsartan - metoprolol held as above  Acute on chronic diastolic heart failure - BNP 600s - CXR showed increasing interstitial edema and effusions - IV lasix '40mg'$  daily - volume status difficult to assess given body habitus - he is requiring 4-5L O2, he is not on O2 at home - echo ordered - Echo from 2021 showed LVEF>55% - Net -3.9L - monitor daily weights, kidney function and I/Os with diuresis  HLD - LDL 50 and TG 130 - continue simvastatin '20mg'$  daily  OSA - continue with CPAP   For questions or updates, please contact [place] Please consult [email or web address] for contact info under    Signed, Sipriano Fendley Ninfa Meeker, PA-C  07/28/2022 8:34 AM

## 2022-07-28 NOTE — Progress Notes (Signed)
[facility] Progress Note  Patient: Blake Garcia    TKW:[id number]  DOA: 07/27/2022    Date of Service: the patient was seen and examined on 07/28/2022  Brief hospital course: 66 year old male with past medical history of diastolic CHF, COPD and morbid obesity who first presented to his cardiologist on 12/27 with complaints of shortness of breath times several days.  EKG revealed AV nodal block/complete heart block and patient sent to emergency department.  Emergency room, potassium of 2.5 and systolic blood pressure almost at 200.  Heart rate in the 40s.  Cardiology consulted and patient admitted to hospitalist service.    Workup reveals acute heart failure.  Beta-blocker held and patient closely monitored.   Assessment and Plan: * Complete heart block [facility]) Cardiology following. Metoprolol to washout.  Home metoprolol held.  CTA ruled out PE.  Curiously, dose has not been changed anytime recently.  Thyroid function studies ordered.  It suspected that likely patient will need permanent pacemaker.  Hypertension associated with diabetes ([place]) - Patient takes amlodipine 5 mg daily, metoprolol tartrate 25 mg twice daily, valsartan 320 mg p.o. daily - Hydralazine 5 mg IV every 8 hours as needed for SBP greater than 180, 3 days ordered.  Pressure is elevated due to acute heart failure.  Diuresing.  Acute on chronic diastolic (congestive) heart failure (HCC) Confirmed with echocardiogram.  In acute heart failure due to exacerbation from AV block.  Diuresing.  Obstructive sleep apnea - CPAP nightly ordered  Major depressive disorder, recurrent episode, severe ([facility]) - Home venlafaxine 75 mg daily, not resumed on admission due to postmarketing report adverse affect  Hyperlipidemia associated with type 2 diabetes mellitus (HCC) - Simvastatin 20 mg nightly resumed  Obesity, Class III, BMI 40-49.9 (morbid obesity) ([place]) - This complicates overall care and prognosis.  Although, 2 years  ago, patient closer to 500 pounds and has lost significant amount of weight.       Body mass index is 46.61 kg/m.        Consultants: Cardiology  Procedures: Echocardiogram noting preserved ejection fraction, mild to moderate mitral regurg and grade 2 diastolic dysfunction  Antimicrobials: None  Code Status: Full code   Subjective: Patient complains of mild shortness of breath with much improved from earlier  Objective: Noted bradycardia, elevated blood pressures Vitals:   07/28/22 1615 07/28/22 1726  BP:  (!) 186/95  Pulse: (!) 49 (!) 48  Resp: (!) 24 (!) 27  Temp:    SpO2: 93% 95%    Intake/Output Summary (Last 24 hours) at 07/28/2022 1902 Last data filed at 07/28/2022 1143 Gross per 24 hour  Intake --  Output 5000 ml  Net -5000 ml   Filed Weights   07/27/22 1620  Weight: 135 kg   Body mass index is 46.61 kg/m.  Exam:  General: Alert and oriented x 3, no acute distress HEENT: Normal cephalic, atraumatic, mucous membranes are moist Cardiovascular: Regular rhythm, bradycardic Respiratory: Decreased breath sounds throughout secondary to body habitus Abdomen: Soft, obese, nontender, positive bowel sounds Musculoskeletal: No clubbing or cyanosis, 1+ pitting edema from the knees down bilaterally Skin: No skin breaks, tears or lesions Psychiatry: Appropriate, no evidence of psychoses Neurology: No focal deficits  Data Reviewed: Potassium of 3.1  Disposition:  Status is: Inpatient Remains inpatient appropriate because:  -Diuresing -Beta-blocker washout -Determination for pacemaker    Anticipated discharge date: 12/31  Family Communication: Declined for me to call any family at this time DVT Prophylaxis: heparin injection 5,000 Units Start:  07/27/22 2200 Place TED hose Start: 07/27/22 1751    Author: Annita Brod ,MD 07/28/2022 7:02 PM  To reach On-call, see care teams to locate the attending and reach out via  [email or web address]. Between 7PM-7AM, please contact night-coverage If you still have difficulty reaching the attending provider, please page the [facility] (Director on Call) for [facility] on amion for assistance.

## 2022-07-28 NOTE — Progress Notes (Signed)
       CROSS COVER NOTE  NAME: Blake Garcia MRN: [id number] DOB : 1955/09/26    HPI/Events of Note   Potassium level of 3.1 reported from nurse  Assessment and  Interventions   Assessment:  Plan: 40 meq oral + 3 10 meq  runs IV (goal K level above 4 Last  mag 12/27 at goal of 2.0, level added to blood in lab

## 2022-07-28 NOTE — Consult Note (Signed)
ELECTROPHYSIOLOGY CONSULT NOTE  Patient ID: Blake Garcia, MRN: [id number], DOB/AGE: February 21, 1956 66 y.o. Admit date: 07/27/2022 Date of Consult: 07/28/2022  Primary Physician: Jinny Sanders, MD Primary Cardiologist: [name] is a 66 y.o. male who is being seen today for the evaluation of dyspnea  ahe heart block going into at the request of MA.   Chief Complaint: dyspnea and heart block   HPI [name] is a 66 y.o. male admitted 12/27 following evaluation for increasing shortness of breath over 3-5 days and found to be in heart block.  He underwent weight loss surgery about a year ago and has lost half of his 550 pounds.  Until this event had been able to work with no longer using oxygen.  Has a history of right bundle branch block.  Hypertension treated with metoprolol amongst others History diastolic dysfunction.   Event recorder 3/21 possibly 3% PVCs some nonsustained ventricular tachycardia Personally reviewed    DATE TEST EF   5/16 Echo   LVH 14.39m  4/21 Echo   >55 % LVH 16/15 mm             Date Cr K Hgb  12/23 0.84 3.1<,3.4 13.1<,14.7         D dimer elevated >> CTA neg  Past Medical History:  Diagnosis Date   Basal cell carcinoma [id number]  [facility]   Cellulitis 70/24/0973  RLE   Complication of anesthesia ~ 2000   "during OR for kidney stones; anesthesia RX made me code twice in one week"   Depression    Diastolic dysfunction    a. 11/2014 Echo: EF 50-55%, Gr 1 DD.   History of blood transfusion 1958   "I was an Rh baby"   History of kidney stones    Hyperlipidemia    Hypertension    Kidney stones    Midsternal chest pain    Morbid obesity ([place]    On home oxygen therapy    OSA (obstructive sleep apnea)    "wear CPAP"   Personal history of colonic adenoma 09/14/2012   Pre-diabetes        Surgical History:  Past Surgical History:  Procedure Laterality Date   CHOLECYSTECTOMY  1993   COLONOSCOPY      COLONOSCOPY WITH PROPOFOL N/A 07/28/2020   Procedure: COLONOSCOPY WITH PROPOFOL;  Surgeon: GGatha Mayer MD;  Location: [facility];  Service: Endoscopy;  Laterality: N/A;   ESOPHAGOGASTRODUODENOSCOPY     KNEE ARTHROSCOPY  ~ 2004   right   LAPAROSCOPY     Surgical sleeve   LITHOTRIPSY  2000   POLYPECTOMY  07/28/2020   Procedure: POLYPECTOMY;  Surgeon: GGatha Mayer MD;  Location: [facility];  Service: Endoscopy;;     Home Meds: Prior to Admission medications   Medication Sig Start Date End Date Taking? Authorizing Provider  amLODipine (NORVASC) 5 MG tablet TAKE 1 TABLET BY MOUTH ONCE DAILY 02/02/22  Yes AWellington Hampshire MD  Calcium Carb-Cholecalciferol (CALCIUM 500 + D PO) Take 3 tablets by mouth in the morning and at bedtime. 11/03/20  Yes [provider]  metoprolol tartrate (LOPRESSOR) 25 MG tablet TAKE 1 TABLET BY MOUTH EVERY 12 HOURS 03/22/22  Yes AWellington Hampshire MD  Multiple Vitamins-Minerals (BARIATRIC MULTIVITAMINS/IRON) CAPS Take 1 capsule by mouth daily.   Yes [provider]  potassium chloride SA (KLOR-CON M) 20 MEQ tablet TAKE ONE AND A HALF TABLETS BY MOUTH EVERY  DAY 03/22/22  Yes Bedsole, Amy E, MD  simvastatin (ZOCOR) 20 MG tablet TAKE ONE TABLET AT BEDTIME 12/21/21  Yes Bedsole, Amy E, MD  valsartan (DIOVAN) 320 MG tablet TAKE 1 TABLET BY MOUTH ONCE DAILY 02/02/22  Yes Wellington Hampshire, MD  venlafaxine XR (EFFEXOR-XR) 75 MG 24 hr capsule TAKE THREE CAPSULES DAILY 04/11/22  Yes Bedsole, Amy E, MD  nystatin (MYCOSTATIN/NYSTOP) powder Apply topically 2 (two) times daily as needed. 05/18/21   [provider]  nystatin-triamcinolone (MYCOLOG II) cream Apply 1 application topically 2 (two) times daily. 06/15/21   Bedsole, Amy E, MD  triamcinolone cream (KENALOG) 0.1 % APPLY TO AFFECTED AREAS TWICE A DAY IF NEEDED 05/03/22   Bedsole, Amy E, MD    Inpatient Medications:   amLODipine  5 mg Oral Daily   furosemide  40 mg Intravenous Daily   heparin   5,000 Units Subcutaneous Q8H   irbesartan  300 mg Oral Daily   simvastatin  20 mg Oral QHS      Allergies: No Known Allergies  Social History   Socioeconomic History   Marital status: Divorced    Spouse name: Not on file   Number of children: 3   Years of education: Not on file   Highest education level: Some college, no degree  Occupational History   Occupation: not employed  Tobacco Use   Smoking status: Never   Smokeless tobacco: Never  Vaping Use   Vaping Use: Never used  Substance and Sexual Activity   Alcohol use: No    Alcohol/week: 0.0 standard drinks of alcohol    Comment: 02/09/12 "may have driink at party once/year"   Drug use: No   Sexual activity: Not Currently  Other Topics Concern   Not on file  Social History Narrative   Uses a scooter for ambulation   He is disabled   He is divorced and has 3 children   No alcohol never smoker no drug use no current tobacco   Social Determinants of Health   Financial Resource Strain: Low Risk  (03/09/2022)   Overall Financial Resource Strain (CARDIA)    Difficulty of Paying Living Expenses: Not hard at all  Food Insecurity: No Food Insecurity (03/09/2022)   Hunger Vital Sign    Worried About Running Out of Food in the Last Year: Never true    Ran Out of Food in the Last Year: Never true  Transportation Needs: No Transportation Needs (03/09/2022)   PRAPARE - Hydrologist (Medical): No    Lack of Transportation (Non-Medical): No  Physical Activity: Sufficiently Active (03/09/2022)   Exercise Vital Sign    Days of Exercise per Week: 4 days    Minutes of Exercise per Session: 60 min  Stress: No Stress Concern Present (03/09/2022)   [place]    Feeling of Stress : Not at all  Social Connections: Moderately Integrated (03/09/2022)   Social Connection and Isolation Panel [NHANES]    Frequency of Communication with Friends and Family:  More than three times a week    Frequency of Social Gatherings with Friends and Family: Once a week    Attends Religious Services: More than 4 times per year    Active Member of [employer] or Organizations: Yes    Attends Archivist Meetings: More than 4 times per year    Marital Status: Divorced  Intimate Partner Violence: Not At Risk (03/09/2022)   Humiliation,  Afraid, Rape, and Kick questionnaire    Fear of Current or Ex-Partner: No    Emotionally Abused: No    Physically Abused: No    Sexually Abused: No     Family History  Problem Relation Age of Onset   Hypertension Mother    Heart disease Mother    Heart disease Father    Stroke Father    Heart disease Brother    Coronary artery disease Brother    Diabetes Brother    Cancer Maternal Aunt        breast   Cancer Maternal Uncle        lung   Heart disease Brother    Coronary artery disease Brother    ADD / ADHD Son    ADD / ADHD Son    ADD / ADHD Daughter      ROS:  Please see the history of present illness.     All other systems reviewed and negative.    Physical Exam: Blood pressure (!) 165/85, pulse (!) 46, temperature 98.5 F (36.9 C), temperature source Oral, resp. rate 16, height '5\' 7"'$  (1.702 m), weight 135 kg, SpO2 93 %. General: Well developed, Morbidly obese male in no acute distress. Head: Normocephalic, atraumatic, sclera non-icteric, no xanthomas, nares are without discharge. EENT: normal Lymph Nodes:  none Back: without scoliosis/kyphosis, no CVA tendersness Neck: Negative for carotid bruits. JVD not elevated. Lungs: Clear bilaterally to auscultation without wheezes, rales, or rhonchi. Breathing is unlabored. Heart: RRR with S1 S2. No murmur , rubs, or gallops appreciated. Abdomen: Soft, non-tender, non-distended with normoactive bowel sounds. No hepatomegaly. No rebound/guarding. No obvious abdominal masses. Msk:  Strength and tone appear normal for age. Extremities: No clubbing or cyanosis. No   edema.  Distal pedal pulses are 2+ and equal bilaterally. Skin: Warm and Dry Neuro: Alert and oriented X 3. CN III-XII intact Grossly normal sensory and motor function . Psych:  Responds to questions appropriately with a normal affect.      Labs: Cardiac Enzymes No results for input(s): "CKTOTAL", "CKMB", "TROPONINI" in the last 72 hours. CBC Lab Results  Component Value Date   WBC 7.2 07/28/2022   HGB 13.1 07/28/2022   HCT 39.9 07/28/2022   MCV 90.9 07/28/2022   PLT 226 07/28/2022   PROTIME: No results for input(s): "LABPROT", "INR" in the last 72 hours. Chemistry  Recent Labs  Lab 07/28/22 0551  NA 139  K 3.1*  CL 104  CO2 27  BUN 12  CREATININE 0.70  CALCIUM 8.4*  GLUCOSE 100*   Lipids Lab Results  Component Value Date   CHOL 112 06/14/2022   HDL 45.00 06/14/2022   LDLCALC 51 06/14/2022   TRIG 78.0 06/14/2022   BNP No results found for: "PROBNP" Thyroid Function Tests: No results for input(s): "TSH", "T4TOTAL", "T3FREE", "THYROIDAB" in the last 72 hours.  Invalid input(s): "FREET3"         EKG: Sinus at 100 bpm with occasionally conducted beats.  QRS of the complete heart block beats has a same morphology as the conducted beats and his baseline right bundle branch block is detected 9/23  Telemetry reviewed.  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

## 2022-07-28 NOTE — Progress Notes (Signed)
Patient seen for cpap. States doing fine nasal cannula for now. Plan is for pending transfer for pacemaker so he will just continue with nasal cannula. Will have rn to call if changes his mind

## 2022-07-28 NOTE — ED Notes (Signed)
Sharion Settler, NP notified that the pts potassium is 3.1

## 2022-07-28 NOTE — Progress Notes (Signed)
*  PRELIMINARY RESULTS* Echocardiogram 2D Echocardiogram has been performed.  Blake Garcia 07/28/2022, 11:11 AM

## 2022-07-29 ENCOUNTER — Observation Stay (HOSPITAL_COMMUNITY): Payer: Medicare Other

## 2022-07-29 ENCOUNTER — Encounter (HOSPITAL_COMMUNITY): Payer: Self-pay | Admitting: Internal Medicine

## 2022-07-29 ENCOUNTER — Observation Stay (HOSPITAL_COMMUNITY)
Admission: RE | Admit: 2022-07-29 | Discharge: 2022-07-30 | Disposition: A | Discharge status: Home / Self Care | Payer: Medicare Other | Attending: Internal Medicine | Admitting: Internal Medicine

## 2022-07-29 ENCOUNTER — Encounter (HOSPITAL_COMMUNITY)
Admission: RE | Disposition: A | Discharge status: Home / Self Care | Payer: Self-pay | Source: Home / Self Care | Attending: Internal Medicine

## 2022-07-29 ENCOUNTER — Observation Stay (HOSPITAL_BASED_OUTPATIENT_CLINIC_OR_DEPARTMENT_OTHER): Payer: Medicare Other

## 2022-07-29 ENCOUNTER — Other Ambulatory Visit: Payer: Self-pay

## 2022-07-29 DIAGNOSIS — J90 Pleural effusion, not elsewhere classified: Secondary | ICD-10-CM | POA: Diagnosis not present

## 2022-07-29 DIAGNOSIS — G4733 Obstructive sleep apnea (adult) (pediatric): Secondary | ICD-10-CM | POA: Insufficient documentation

## 2022-07-29 DIAGNOSIS — J811 Chronic pulmonary edema: Secondary | ICD-10-CM | POA: Diagnosis not present

## 2022-07-29 DIAGNOSIS — I3139 Other pericardial effusion (noninflammatory): Secondary | ICD-10-CM | POA: Diagnosis not present

## 2022-07-29 DIAGNOSIS — I442 Atrioventricular block, complete: Principal | ICD-10-CM | POA: Insufficient documentation

## 2022-07-29 DIAGNOSIS — Z85828 Personal history of other malignant neoplasm of skin: Secondary | ICD-10-CM | POA: Insufficient documentation

## 2022-07-29 DIAGNOSIS — Z79899 Other long term (current) drug therapy: Secondary | ICD-10-CM | POA: Diagnosis not present

## 2022-07-29 DIAGNOSIS — I110 Hypertensive heart disease with heart failure: Secondary | ICD-10-CM | POA: Diagnosis not present

## 2022-07-29 DIAGNOSIS — I5033 Acute on chronic diastolic (congestive) heart failure: Secondary | ICD-10-CM | POA: Diagnosis not present

## 2022-07-29 DIAGNOSIS — Z950 Presence of cardiac pacemaker: Secondary | ICD-10-CM | POA: Diagnosis not present

## 2022-07-29 DIAGNOSIS — I493 Ventricular premature depolarization: Secondary | ICD-10-CM | POA: Diagnosis not present

## 2022-07-29 DIAGNOSIS — I10 Essential (primary) hypertension: Secondary | ICD-10-CM | POA: Diagnosis not present

## 2022-07-29 DIAGNOSIS — E119 Type 2 diabetes mellitus without complications: Secondary | ICD-10-CM | POA: Insufficient documentation

## 2022-07-29 DIAGNOSIS — R012 Other cardiac sounds: Secondary | ICD-10-CM

## 2022-07-29 HISTORY — PX: PACEMAKER IMPLANT: EP1218

## 2022-07-29 LAB — BASIC METABOLIC PANEL
Anion gap: 10 (ref 5–15)
BUN: 12 mg/dL (ref 8–23)
CO2: 24 mmol/L (ref 22–32)
Calcium: 8.7 mg/dL — ABNORMAL LOW (ref 8.9–10.3)
Chloride: 104 mmol/L (ref 98–111)
Creatinine, Ser: 0.59 mg/dL — ABNORMAL LOW (ref 0.61–1.24)
GFR, Estimated: >60 mL/min (ref >60)
Glucose, Bld: 96 mg/dL (ref 70–99)
Potassium: 3.5 mmol/L (ref 3.5–5.1)
Sodium: 138 mmol/L (ref 135–145)

## 2022-07-29 LAB — ECHOCARDIOGRAM COMPLETE

## 2022-07-29 LAB — ECHOCARDIOGRAM LIMITED

## 2022-07-29 SURGERY — PACEMAKER IMPLANT
Anesthesia: LOCAL

## 2022-07-29 MED ORDER — CEFAZOLIN IN SODIUM CHLORIDE 3-0.9 GM/100ML-% IV SOLN
3.0000 g | INTRAVENOUS | Status: AC
  Administered 2022-07-29: 3 g via INTRAVENOUS
  Filled 2022-07-29: qty 100

## 2022-07-29 MED ORDER — MIDAZOLAM HCL 5 MG/5ML IJ SOLN
INTRAMUSCULAR | Status: AC
  Filled 2022-07-29: qty 5

## 2022-07-29 MED ORDER — FENTANYL CITRATE (PF) 100 MCG/2ML IJ SOLN
INTRAMUSCULAR | Status: DC | PRN
  Administered 2022-07-29 (×2): 12.5 ug via INTRAVENOUS

## 2022-07-29 MED ORDER — SODIUM CHLORIDE 0.9% FLUSH: 3.0000 mL | INTRAVENOUS | Status: DC | PRN

## 2022-07-29 MED ORDER — PERFLUTREN LIPID MICROSPHERE
1.0000 mL | INTRAVENOUS | Status: AC | PRN
  Administered 2022-07-29: 3 mL via INTRAVENOUS

## 2022-07-29 MED ORDER — SODIUM CHLORIDE 0.9 % IV SOLN: 80.0000 mg | INTRAVENOUS | Status: DC

## 2022-07-29 MED ORDER — SODIUM CHLORIDE 0.9 % IV SOLN: INTRAVENOUS | Status: DC

## 2022-07-29 MED ORDER — CEFAZOLIN IN SODIUM CHLORIDE 3-0.9 GM/100ML-% IV SOLN: 3.0000 g | INTRAVENOUS | Status: DC

## 2022-07-29 MED ORDER — MIDAZOLAM HCL 5 MG/5ML IJ SOLN
INTRAMUSCULAR | Status: DC | PRN
  Administered 2022-07-29 (×2): 1 mg via INTRAVENOUS

## 2022-07-29 MED ORDER — CHLORHEXIDINE GLUCONATE 4 % EX LIQD: 60.0000 mL | Freq: Once | CUTANEOUS | Status: DC

## 2022-07-29 MED ORDER — VANCOMYCIN HCL 1000 MG IV SOLR: Status: DC | PRN

## 2022-07-29 MED ORDER — LIDOCAINE HCL (PF) 1 % IJ SOLN
INTRAMUSCULAR | Status: DC | PRN
  Administered 2022-07-29: 60 mL

## 2022-07-29 MED ORDER — ONDANSETRON HCL 4 MG/2ML IJ SOLN: 4.0000 mg | Freq: Four times a day (QID) | INTRAMUSCULAR | Status: DC | PRN

## 2022-07-29 MED ORDER — SODIUM CHLORIDE 0.9 % IV SOLN
INTRAVENOUS | Status: AC
  Filled 2022-07-29: qty 2

## 2022-07-29 MED ORDER — SODIUM CHLORIDE 0.9 % IV SOLN
INTRAVENOUS | Status: DC | PRN
  Administered 2022-07-29 (×2): 80 mg

## 2022-07-29 MED ORDER — ACETAMINOPHEN 325 MG PO TABS
325.0000 mg | ORAL_TABLET | ORAL | Status: DC | PRN
  Administered 2022-07-29 – 2022-07-30 (×2): 650 mg via ORAL
  Filled 2022-07-29 (×2): qty 2

## 2022-07-29 MED ORDER — ONDANSETRON HCL 4 MG/2ML IJ SOLN
INTRAMUSCULAR | Status: AC
  Filled 2022-07-29: qty 2

## 2022-07-29 MED ORDER — HEPARIN (PORCINE) IN NACL 1000-0.9 UT/500ML-% IV SOLN
INTRAVENOUS | Status: DC | PRN
  Administered 2022-07-29: 500 mL

## 2022-07-29 MED ORDER — FENTANYL CITRATE (PF) 100 MCG/2ML IJ SOLN
INTRAMUSCULAR | Status: AC
  Filled 2022-07-29: qty 2

## 2022-07-29 MED ORDER — CEFAZOLIN SODIUM-DEXTROSE 1-4 GM/50ML-% IV SOLN
1.0000 g | Freq: Four times a day (QID) | INTRAVENOUS | Status: AC
  Administered 2022-07-29 – 2022-07-30 (×3): 1 g via INTRAVENOUS
  Filled 2022-07-29 (×5): qty 50

## 2022-07-29 MED ORDER — SODIUM CHLORIDE 0.9% FLUSH: 3.0000 mL | Freq: Two times a day (BID) | INTRAVENOUS | Status: DC

## 2022-07-29 MED ORDER — ONDANSETRON HCL 4 MG/2ML IJ SOLN
INTRAMUSCULAR | Status: DC | PRN
  Administered 2022-07-29 (×2): 2 mg via INTRAVENOUS

## 2022-07-29 MED ORDER — ACETAMINOPHEN 325 MG PO TABS: 650.0000 mg | ORAL_TABLET | ORAL | Status: DC | PRN

## 2022-07-29 MED ORDER — SODIUM CHLORIDE 0.9 % IV SOLN: 250.0000 mL | INTRAVENOUS | Status: DC

## 2022-07-29 SURGICAL SUPPLY — 12 items
CABLE SURGICAL S-101-97-12 (CABLE) ×2 IMPLANT
HELIX LOCKING TOOL (MISCELLANEOUS) ×1
LEAD ULTIPACE 52 LPA1231/52 (Lead) IMPLANT
LEAD ULTIPACE 65 LPA1231/65 (Lead) IMPLANT
PACEMAKER ASSURITY DR-RF (Pacemaker) IMPLANT
PAD DEFIB RADIO PHYSIO CONN (PAD) ×2 IMPLANT
SHEATH 7FR PRELUDE SNAP 13 (SHEATH) IMPLANT
SHEATH 9FR PRELUDE SNAP 13 (SHEATH) IMPLANT
SLITTER AGILIS HISPRO (INSTRUMENTS) IMPLANT
TOOL HELIX LOCKING (MISCELLANEOUS) IMPLANT
TRAY PACEMAKER INSERTION (PACKS) ×2 IMPLANT
WIRE HI TORQ VERSACORE-J 145CM (WIRE) IMPLANT

## 2022-07-29 NOTE — Progress Notes (Signed)
  Echocardiogram 2D Echocardiogram has been performed.  Darlina Sicilian M 07/29/2022, 1:58 PM

## 2022-07-29 NOTE — H&P (Addendum)
Cardiology Admission History and Physical   Patient ID: Blake Garcia MRN: [id number]; DOB: 22-Sep-1955   Admission date: 07/29/2022  PCP:  Jinny Sanders, MD   [facility] Cardiologist:  Kathlyn Sacramento, MD   {   Chief Complaint:  symptomatic bradycardia  Patient Profile:   Blake Garcia is a 66 y.o. male with PMHx of HTN, obesity, OSA, DM, diastolic HF who is being seen 07/29/2022 for the evaluation of CHB.  History of Present Illness:   Mr. Blake Garcia reached out withc/o progressive SOB, weakness, he was seen in the office with elevated BP and bradycardia, EKG reported Mobitz I and II block > to the ER   He was admitted 07/27/22 to [facility], his home metoprolol held. Labs noted  ddimer elevated>Chest CTA negative for PE  Mild hypokalemia BP very high HRs remained in the 40's Last dose of metoprolol reported 07/27/22 morning. No CP, neg HS trops  This morning with ongoig heart block despite BB washout, he remained with heart block and transferred to [facility] for PPM  LABS reviewed   Past Medical History:  Diagnosis Date   Basal cell carcinoma [id number]   [facility]   Cellulitis 8/46/9629   RLE   Complication of anesthesia ~ 2000   "during OR for kidney stones; anesthesia RX made me code twice in one week"   Depression    Diastolic dysfunction    a. 11/2014 Echo: EF 50-55%, Gr 1 DD.   History of blood transfusion 1958   "I was an Rh baby"   History of kidney stones    Hyperlipidemia    Hypertension    Kidney stones    Midsternal chest pain    Morbid obesity ([place])    On home oxygen therapy    OSA (obstructive sleep apnea)    "wear CPAP"   Personal history of colonic adenoma 09/14/2012   Pre-diabetes     Past Surgical History:  Procedure Laterality Date   CHOLECYSTECTOMY  1993   COLONOSCOPY     COLONOSCOPY WITH PROPOFOL N/A 07/28/2020   Procedure: COLONOSCOPY WITH PROPOFOL;  Surgeon: Gatha Mayer, MD;  Location: [facility]  END[facility];  Service: Endoscopy;  Laterality: N/A;   ESOPHAGOGASTRODUODENOSCOPY     KNEE ARTHROSCOPY  ~ 2004   right   LAPAROSCOPY     Surgical sleeve   LITHOTRIPSY  2000   POLYPECTOMY  07/28/2020   Procedure: POLYPECTOMY;  Surgeon: Gatha Mayer, MD;  Location: [facility];  Service: Endoscopy;;     Medications Prior to Admission: Prior to Admission medications   Medication Sig Start Date End Date Taking? Authorizing Provider  amLODipine (NORVASC) 5 MG tablet TAKE 1 TABLET BY MOUTH ONCE DAILY 02/02/22   Wellington Hampshire, MD  Calcium Carb-Cholecalciferol (CALCIUM 500 + D PO) Take 3 tablets by mouth in the morning and at bedtime. 11/03/20   [provider]  Multiple Vitamins-Minerals (BARIATRIC MULTIVITAMINS/IRON) CAPS Take 1 capsule by mouth daily.    [provider]  nystatin (MYCOSTATIN/NYSTOP) powder Apply topically 2 (two) times daily as needed. 05/18/21   [provider]  nystatin-triamcinolone (MYCOLOG II) cream Apply 1 application topically 2 (two) times daily. 06/15/21   Bedsole, Amy E, MD  potassium chloride SA (KLOR-CON M) 20 MEQ tablet TAKE ONE AND A HALF TABLETS BY MOUTH EVERY DAY 03/22/22   Bedsole, Amy E, MD  simvastatin (ZOCOR) 20 MG tablet TAKE ONE TABLET AT BEDTIME 12/21/21   Jinny Sanders, MD  triamcinolone cream (KENALOG) 0.1 % APPLY TO AFFECTED AREAS TWICE A DAY IF NEEDED 05/03/22   Bedsole, Amy E, MD  valsartan (DIOVAN) 320 MG tablet TAKE 1 TABLET BY MOUTH ONCE DAILY 02/02/22   Wellington Hampshire, MD  venlafaxine XR (EFFEXOR-XR) 75 MG 24 hr capsule TAKE THREE CAPSULES DAILY 04/11/22   Jinny Sanders, MD     Allergies:   No Known Allergies  Social History:   Social History   Socioeconomic History   Marital status: Divorced    Spouse name: Not on file   Number of children: 3   Years of education: Not on file   Highest education level: Some college, no degree  Occupational History   Occupation: not employed  Tobacco Use   Smoking status:  Never   Smokeless tobacco: Never  Vaping Use   Vaping Use: Never used  Substance and Sexual Activity   Alcohol use: No    Alcohol/week: 0.0 standard drinks of alcohol    Comment: 02/09/12 "may have driink at party once/year"   Drug use: No   Sexual activity: Not Currently  Other Topics Concern   Not on file  Social History Narrative   Uses a scooter for ambulation   He is disabled   He is divorced and has 3 children   No alcohol never smoker no drug use no current tobacco   Social Determinants of Health   Financial Resource Strain: Low Risk  (03/09/2022)   Overall Financial Resource Strain (CARDIA)    Difficulty of Paying Living Expenses: Not hard at all  Food Insecurity: No Food Insecurity (03/09/2022)   Hunger Vital Sign    Worried About Running Out of Food in the Last Year: Never true    [place] in the Last Year: Never true  Transportation Needs: No Transportation Needs (03/09/2022)   PRAPARE - Hydrologist (Medical): No    Lack of Transportation (Non-Medical): No  Physical Activity: Sufficiently Active (03/09/2022)   Exercise Vital Sign    Days of Exercise per Week: 4 days    Minutes of Exercise per Session: 60 min  Stress: No Stress Concern Present (03/09/2022)   [place]    Feeling of Stress : Not at all  Social Connections: Moderately Integrated (03/09/2022)   Social Connection and Isolation Panel [NHANES]    Frequency of Communication with Friends and Family: More than three times a week    Frequency of Social Gatherings with Friends and Family: Once a week    Attends Religious Services: More than 4 times per year    Active Member of [employer] or Organizations: Yes    Attends Archivist Meetings: More than 4 times per year    Marital Status: Divorced  Intimate Partner Violence: Not At Risk (03/09/2022)   Humiliation, Afraid, Rape, and Kick questionnaire    Fear of  Current or Ex-Partner: No    Emotionally Abused: No    Physically Abused: No    Sexually Abused: No    Family History:   The patient's family history includes ADD / ADHD in his daughter, son, and son; Cancer in his maternal aunt and maternal uncle; Coronary artery disease in his brother and brother; Diabetes in his brother; Heart disease in his brother, brother, father, and mother; Hypertension in his mother; Stroke in his father.    ROS:  Please see the history of present illness.  All other  ROS reviewed and negative.     Physical Exam/Data:  There were no vitals filed for this visit. No intake or output data in the 24 hours ending 07/29/22 1040    07/27/2022    4:20 PM 07/27/2022    3:09 PM 06/28/2022   10:36 AM  Last 3 Weights  Weight (lbs) 297 lb 9.9 oz 298 lb 310 lb 2 oz  Weight (kg) 135 kg 135.172 kg 140.672 kg     There is no height or weight on file to calculate BMI.  General:  Well nourished, well developed, in no acute distress HEENT: normal Neck: neck is obese, no appreciable JVD Vascular: No carotid bruits; Distal pulses 2+ bilaterally   Cardiac:  RRR; no murmurs, gallops or rubs, bradycardic Lungs:  slightly diminished at the bases, no wheezing, rhonchi or rales  Abd: soft, nontender Ext: no edema Musculoskeletal:  No deformities, BUE and BLE strength normal and equal Skin: warm and dry  Neuro:  CNs 2-12 intact, no focal abnormalities noted Psych:  Normal affect    EKG:  The ECG that was done 07/27/22 was personally reviewed and demonstrates CHB 48bpm, RBBB  Relevant CV Studies:  1. Left ventricular ejection fraction, by estimation, is 55 to 60%. The  left ventricle has normal function. The left ventricle has no regional  wall motion abnormalities. The left ventricular internal cavity size was  mildly dilated. There is moderate  left ventricular hypertrophy. Left ventricular diastolic parameters are  consistent with Grade II diastolic dysfunction  (pseudonormalization).   2. Right ventricular systolic function is normal. The right ventricular  size is normal. There is normal pulmonary artery systolic pressure.   3. Left atrial size was moderately dilated.   4. Right atrial size was mildly dilated.   5. The mitral valve is normal in structure. Mild to moderate mitral valve  regurgitation. No evidence of mitral stenosis.   6. The aortic valve is normal in structure. Aortic valve regurgitation is  not visualized. Aortic valve sclerosis is present, with no evidence of  aortic valve stenosis.   7. The inferior vena cava is dilated in size with >50% respiratory  variability, suggesting right atrial pressure of 8 mmHg.   Laboratory Data:  High Sensitivity Troponin:   Recent Labs  Lab 07/27/22 1642 07/27/22 1848  TROPONINIHS 13 15      Chemistry Recent Labs  Lab 07/27/22 1848 07/28/22 0551 07/28/22 2048 07/29/22 0458  NA  --    < > 137 138  K  --    < > 3.6 3.5  CL  --    < > 101 104  CO2  --    < > 29 24  GLUCOSE  --    < > 116* 96  BUN  --    < > 14 12  CREATININE  --    < > 0.83 0.59*  CALCIUM  --    < > 8.6* 8.7*  MG 2.0  --  2.0  --   GFRNONAA  --    < > >60 >60  ANIONGAP  --    < > 7 10   < > = values in this interval not displayed.    No results for input(s): "PROT", "ALBUMIN", "AST", "ALT", "ALKPHOS", "BILITOT" in the last 168 hours. Lipids No results for input(s): "CHOL", "TRIG", "HDL", "LABVLDL", "LDLCALC", "CHOLHDL" in the last 168 hours. Hematology Recent Labs  Lab 07/27/22 1642 07/28/22 0551  WBC 7.5 7.2  RBC 4.90 4.39  HGB 14.7 13.1  HCT 44.4 39.9  MCV 90.6 90.9  MCH 30.0 29.8  MCHC 33.1 32.8  RDW 14.1 13.9  PLT 270 226   Thyroid No results for input(s): "TSH", "FREET4" in the last 168 hours. BNP Recent Labs  Lab 07/27/22 1848  BNP 650.3*    DDimer  Recent Labs  Lab 07/27/22 1926  DDIMER 0.84*     Radiology/Studies:    Assessment and Plan:   Symptomatic bradycardia Advanced  heart block Continues despite holding his BB Transferred to [facility] for PPM Dr. Lovena Le discussed procedure, he is agreeable to proceed   Acute/chronic HF (diastolic) Improved, denies ongoing SOB Likely exacerbated by bradycardia/heart block  HTN PVCs Resume BB post pacer   Risk Assessment/Risk Scores:    For questions or updates, please contact [place] Please consult [email or web address] for contact info under     Signed, Baldwin Jamaica, PA-C  07/29/2022 10:40 AM    EP Attending  Patient seen and examined. Agree with above. The patient has presistent heart block despite stopping his AV nodal blocking drugs 2 days ago. He will proceed with DDD PM insertion. I have discussed the indications/risks/benefits/goals/expectations and he wishes to proceed.  Carleene Overlie Natavia Sublette,MD

## 2022-07-29 NOTE — Progress Notes (Addendum)
Rounding Note    Patient Name: Blake Garcia Date of Encounter: 07/29/2022  [place] Cardiologist: Kathlyn Sacramento, MD   Subjective   He reports improvement in shortness of breath.  He continues to be in high-grade AV block mostly with 2-1 AV block and intermittent A-V dissociation.  He does have frequent PVCs as well.  Inpatient Medications    Scheduled Meds:  amLODipine  5 mg Oral Daily   furosemide  40 mg Intravenous Daily   heparin  5,000 Units Subcutaneous Q8H   hydrALAZINE  25 mg Oral Q8H   irbesartan  300 mg Oral Daily   potassium chloride  40 mEq Oral Once   simvastatin  20 mg Oral QHS   Continuous Infusions:  PRN Meds: acetaminophen **OR** acetaminophen, hydrALAZINE, nystatin, ondansetron **OR** ondansetron (ZOFRAN) IV, senna-docusate   Vital Signs    Vitals:   07/28/22 2100 07/29/22 0145 07/29/22 0425 07/29/22 0731  BP: (!) 188/90 (!) 157/74 (!) 170/66 (!) 147/61  Pulse: (!) 49 (!) 47 (!) 45 (!) 49  Resp: '17 16 14 18  '$ Temp:  98.2 F (36.8 C) 97.6 F (36.4 C) 98 F (36.7 C)  TempSrc:  Oral Oral   SpO2: 94% 100% 98% 97%  Weight:      Height:        Intake/Output Summary (Last 24 hours) at 07/29/2022 0815 Last data filed at 07/29/2022 0425 Gross per 24 hour  Intake 300 ml  Output 2150 ml  Net -1850 ml      07/27/2022    4:20 PM 07/27/2022    3:09 PM 06/28/2022   10:36 AM  Last 3 Weights  Weight (lbs) 297 lb 9.9 oz 298 lb 310 lb 2 oz  Weight (kg) 135 kg 135.172 kg 140.672 kg      Telemetry    2-1 AV block and intermittent A-V dissociation.  Frequent PVCs- Personally Reviewed  ECG     - Personally Reviewed  Physical Exam   GEN: No acute distress.   Neck: No JVD Cardiac: RRR, no murmurs, rubs, or gallops.  Respiratory: Clear to auscultation bilaterally. GI: Soft, nontender, non-distended  MS: No edema; No deformity. Neuro:  Nonfocal  Psych: Normal affect   Labs    High Sensitivity Troponin:   Recent Labs   Lab 07/27/22 1642 07/27/22 1848  TROPONINIHS 13 15     Chemistry Recent Labs  Lab 07/27/22 1848 07/28/22 0551 07/28/22 2048 07/29/22 0458  NA  --  139 137 138  K  --  3.1* 3.6 3.5  CL  --  104 101 104  CO2  --  '27 29 24  '$ GLUCOSE  --  100* 116* 96  BUN  --  '12 14 12  '$ CREATININE  --  0.70 0.83 0.59*  CALCIUM  --  8.4* 8.6* 8.7*  MG 2.0  --  2.0  --   GFRNONAA  --  >60 >60 >60  ANIONGAP  --  '8 7 10    '$ Lipids No results for input(s): "CHOL", "TRIG", "HDL", "LABVLDL", "LDLCALC", "CHOLHDL" in the last 168 hours.  Hematology Recent Labs  Lab 07/27/22 1642 07/28/22 0551  WBC 7.5 7.2  RBC 4.90 4.39  HGB 14.7 13.1  HCT 44.4 39.9  MCV 90.6 90.9  MCH 30.0 29.8  MCHC 33.1 32.8  RDW 14.1 13.9  PLT 270 226   Thyroid No results for input(s): "TSH", "FREET4" in the last 168 hours.  BNP Recent Labs  Lab 07/27/22 1848  BNP 650.3*    DDimer  Recent Labs  Lab 07/27/22 1926  DDIMER 0.84*     Radiology    ECHOCARDIOGRAM COMPLETE  Result Date: 07/28/2022    ECHOCARDIOGRAM REPORT   Patient Name:   Blake Garcia Date of Exam: 07/28/2022 Medical Rec #:  [id number]         Height:       67.0 in Accession #:    [id number]        Weight:       297.6 lb Date of Birth:  [birth year]          BSA:          2.394 m Patient Age:    66 years          BP:           181/90 mmHg Patient Gender: M                 HR:           46 bpm. Exam Location:  [facility] Procedure: 2D Echo, Cardiac Doppler and Color Doppler Indications:     Dyspnea, Heart Block  History:         Patient has prior history of Echocardiogram examinations, most                  recent 11/19/2019. CHF, Arrythmias:RBBB and Bradycardia; Risk                  Factors:Hypertension and Diabetes.  Sonographer:     Wenda Low Referring Phys:  [id number] AMY N [facility] Diagnosing Phys: Kathlyn Sacramento MD  Sonographer Comments: Patient is obese. IMPRESSIONS  1. Left ventricular ejection fraction, by estimation, is 55 to 60%. The left ventricle  has normal function. The left ventricle has no regional wall motion abnormalities. The left ventricular internal cavity size was mildly dilated. There is moderate left ventricular hypertrophy. Left ventricular diastolic parameters are consistent with Grade II diastolic dysfunction (pseudonormalization).  2. Right ventricular systolic function is normal. The right ventricular size is normal. There is normal pulmonary artery systolic pressure.  3. Left atrial size was moderately dilated.  4. Right atrial size was mildly dilated.  5. The mitral valve is normal in structure. Mild to moderate mitral valve regurgitation. No evidence of mitral stenosis.  6. The aortic valve is normal in structure. Aortic valve regurgitation is not visualized. Aortic valve sclerosis is present, with no evidence of aortic valve stenosis.  7. The inferior vena cava is dilated in size with >50% respiratory variability, suggesting right atrial pressure of 8 mmHg. FINDINGS  Left Ventricle: Left ventricular ejection fraction, by estimation, is 55 to 60%. The left ventricle has normal function. The left ventricle has no regional wall motion abnormalities. The left ventricular internal cavity size was mildly dilated. There is  moderate left ventricular hypertrophy. Left ventricular diastolic parameters are consistent with Grade II diastolic dysfunction (pseudonormalization). Right Ventricle: The right ventricular size is normal. No increase in right ventricular wall thickness. Right ventricular systolic function is normal. There is normal pulmonary artery systolic pressure. The tricuspid regurgitant velocity is 2.60 m/s, and  with an assumed right atrial pressure of 3 mmHg, the estimated right ventricular systolic pressure is 76.2 mmHg. Left Atrium: Left atrial size was moderately dilated. Right Atrium: Right atrial size was mildly dilated. Pericardium: There is no evidence of pericardial effusion. Mitral Valve: The mitral valve is normal in  structure. Mild to moderate mitral valve regurgitation.  No evidence of mitral valve stenosis. MV peak gradient, 10.2 mmHg. The mean mitral valve gradient is 5.0 mmHg. Tricuspid Valve: The tricuspid valve is normal in structure. Tricuspid valve regurgitation is trivial. No evidence of tricuspid stenosis. Aortic Valve: The aortic valve is normal in structure. Aortic valve regurgitation is not visualized. Aortic valve sclerosis is present, with no evidence of aortic valve stenosis. Aortic valve mean gradient measures 7.0 mmHg. Aortic valve peak gradient measures 12.7 mmHg. Aortic valve area, by VTI measures 1.92 cm. Pulmonic Valve: The pulmonic valve was normal in structure. Pulmonic valve regurgitation is not visualized. No evidence of pulmonic stenosis. Aorta: The aortic root is normal in size and structure. Venous: The inferior vena cava is dilated in size with greater than 50% respiratory variability, suggesting right atrial pressure of 8 mmHg. IAS/Shunts: No atrial level shunt detected by color flow Doppler.  LEFT VENTRICLE PLAX 2D LVIDd:         5.80 cm   Diastology LVIDs:         3.60 cm   LV e' medial:    5.98 cm/s LV PW:         1.60 cm   LV E/e' medial:  21.2 LV IVS:        1.50 cm   LV e' lateral:   10.20 cm/s LVOT diam:     2.00 cm   LV E/e' lateral: 12.5 LV SV:         80 LV SV Index:   34 LVOT Area:     3.14 cm  RIGHT VENTRICLE RV Basal diam:  3.90 cm RV Mid diam:    3.20 cm RV S prime:     15.40 cm/s TAPSE (M-mode): 3.3 cm LEFT ATRIUM              Index        RIGHT ATRIUM           Index LA diam:        5.10 cm  2.13 cm/m   RA Area:     20.40 cm LA Vol (A2C):   95.1 ml  39.72 ml/m  RA Volume:   55.70 ml  23.26 ml/m LA Vol (A4C):   100.0 ml 41.76 ml/m LA Biplane Vol: 98.6 ml  41.18 ml/m  AORTIC VALVE                     PULMONIC VALVE AV Area (Vmax):    2.26 cm      PV Vmax:       0.89 m/s AV Area (Vmean):   1.97 cm      PV Peak grad:  3.2 mmHg AV Area (VTI):     1.92 cm AV Vmax:            178.00 cm/s AV Vmean:          119.000 cm/s AV VTI:            0.419 m AV Peak Grad:      12.7 mmHg AV Mean Grad:      7.0 mmHg LVOT Vmax:         128.00 cm/s LVOT Vmean:        74.600 cm/s LVOT VTI:          0.256 m LVOT/AV VTI ratio: 0.61  AORTA Ao Root diam: 3.30 cm Ao Asc diam:  3.40 cm MITRAL VALVE  TRICUSPID VALVE MV Area (PHT): 4.26 cm     TR Peak grad:   27.0 mmHg MV Area VTI:   1.64 cm     TR Vmax:        260.00 cm/s MV Peak grad:  10.2 mmHg MV Mean grad:  5.0 mmHg     SHUNTS MV Vmax:       1.60 m/s     Systemic VTI:  0.26 m MV Vmean:      103.0 cm/s   Systemic Diam: 2.00 cm MV Decel Time: 178 msec MR Peak grad: 112.4 mmHg MR Mean grad: 66.0 mmHg MR Vmax:      530.00 cm/s MR Vmean:     377.0 cm/s MV E velocity: 127.00 cm/s MV A velocity: 162.00 cm/s MV E/A ratio:  0.78 Kathlyn Sacramento MD Electronically signed by Kathlyn Sacramento MD Signature Date/Time: 07/28/2022/11:51:05 AM    Final    CT Angio Chest Pulmonary Embolism (PE) W or WO Contrast  Result Date: 07/27/2022 CLINICAL DATA:  Positive D-dimer, bradycardia, chest pain EXAM: CT ANGIOGRAPHY CHEST WITH CONTRAST TECHNIQUE: Multidetector CT imaging of the chest was performed using the standard protocol during bolus administration of intravenous contrast. Multiplanar CT image reconstructions and MIPs were obtained to evaluate the vascular anatomy. RADIATION DOSE REDUCTION: This exam was performed according to the departmental dose-optimization program which includes automated exposure control, adjustment of the mA and/or kV according to patient size and/or use of iterative reconstruction technique. CONTRAST:  120m OMNIPAQUE IOHEXOL 350 MG/ML SOLN COMPARISON:  07/27/2022 FINDINGS: Cardiovascular: This is a technically adequate evaluation of the pulmonary vasculature. Evaluation of the upper lung zones is limited due to respiratory motion. No filling defects or pulmonary emboli. The heart is unremarkable without pericardial effusion. No  evidence of thoracic aortic aneurysm or dissection. Atherosclerosis of the aorta and coronary vasculature, greatest in the LAD distribution. Mediastinum/Nodes: Thyroid, trachea, and esophagus are unremarkable. Borderline enlarged mediastinal lymph nodes are nonspecific, largest measuring 12 mm in the pretracheal region. Lungs/Pleura: Small bilateral pleural effusions, volume estimated less than 1 L each. Dependent bilateral lower lobe atelectasis. Central vascular congestion. No airspace disease or pneumothorax. The central airways are patent. Upper Abdomen: No acute abnormality. Musculoskeletal: No acute or destructive bony lesions. Reconstructed images demonstrate no additional findings. Review of the MIP images confirms the above findings. IMPRESSION: 1. No evidence of pulmonary embolus. 2. Central vascular congestion and bilateral pleural effusions. 3. Aortic Atherosclerosis (ICD10-I70.0). Coronary artery atherosclerosis. 4. Borderline enlarged mediastinal lymph nodes, likely reactive. Electronically Signed   By: MRanda NgoM.D.   On: 07/27/2022 21:00   DG Chest 2 View  Result Date: 07/27/2022 CLINICAL DATA:  Bradycardia, chest pain EXAM: CHEST - 2 VIEW COMPARISON:  02/12/2012 FINDINGS: Normal cardiac silhouette. Interval increase in venous congestion and interstitial edema pattern. Small bilateral pleural effusion. No focal consolidation. IMPRESSION: Increasing interstitial edema and effusions. Electronically Signed   By: SSuzy BouchardM.D.   On: 07/27/2022 16:51    Cardiac Studies   I personally reviewed his echocardiogram done yesterday which showed normal LV systolic function with grade 2 diastolic dysfunction, moderately dilated left atrium and mild to moderate mitral regurgitation.  Patient Profile     66y.o. male  with known history of essential hypertension, chronic diastolic heart failure, PVCs and morbid obesity status post weight loss surgery with 200 pounds of weight loss since  then who presented with increased shortness of breath and bradycardia and was noted to be in high-grade AV block.  Assessment & Plan    1.  Symptomatic high-grade AV block: This is persistent in spite of being off metoprolol for 48 hours now.  Recommend transfer to [facility] for a permanent pacemaker placement.  He is NPO.  He was seen by Dr. Caryl Comes yesterday.  2.  Acute on chronic diastolic heart failure, likely exacerbated by high-grade AV block.  He is currently getting IV furosemide 40 mg daily and reports improvement in shortness of breath.  3.  Uncontrolled hypertension: Metoprolol tartrate has been on hold.  Continue home dose of amlodipine and valsartan.  Blood pressure improved with addition of small dose hydralazine.  4.  Frequent PVCs: Suspect that he will likely require going back on a beta-blocker once he has a pacemaker in place.   Total encounter time more than 50 minutes. Greater than 50% was spent in counseling and coordination of care with the patient.   For questions or updates, please contact [place] Please consult [email or web address] for contact info under        Signed, Kathlyn Sacramento, MD  07/29/2022, 8:15 AM

## 2022-07-29 NOTE — Discharge Summary (Signed)
Physician Discharge Summary   Patient: Blake Garcia MRN: [id number] DOB: 06-01-1956  Admit date:     07/27/2022  Discharge date: 07/29/22  Discharge Physician: Fritzi Mandes   PCP: Jinny Sanders, MD   Recommendations at discharge:    F/u Cardiology after discharge from [facility] F/u PCP in 1-2 weeks  Discharge Diagnoses: Principal Problem:   Complete heart block [facility]) Active Problems:   Hypertension associated with diabetes ([place])   Hypokalemia   Acute on chronic diastolic (congestive) heart failure (HCC)   Obstructive sleep apnea   METABOLIC SYNDROME X   Major depressive disorder, recurrent episode, severe (HCC)   Hyperlipidemia associated with type 2 diabetes mellitus (HCC)   Obesity, Class III, BMI 40-49.9 (morbid obesity) [facility])   Hospital Course: 66 year old male with past medical history of diastolic CHF, COPD and morbid obesity who first presented to his cardiologist on 12/27 with complaints of shortness of breath times several days.  EKG revealed AV nodal block/complete heart block and patient sent to emergency department.  Emergency room, potassium of 2.5 and systolic blood pressure almost at 200.  Heart rate in the 40s.  Cardiology consulted and patient admitted to hospitalist service.    Workup reveals acute heart failure.  Beta-blocker held and patient closely monitored.  Assessment and Plan:  Complete heart block [facility]) [facility] following. Home metoprolol held.  CTA ruled out PE.  D/w Dr Arida--pt is transferring to [facility] for PM placement with Dr Lovena Le   Hypertension associated with diabetes ([place]) - cont amlodipine,Diovan --d/ced BB   Acute on chronic diastolic (congestive) heart failure ([place]) Confirmed with echocardiogram.  In acute heart failure due to exacerbation from AV block --pt stable at present. Wean to RA --does not use oxygen at home   Obstructive sleep apnea - CPAP nightly ordered   Major depressive disorder, recurrent episode, severe  ([place]) - on venlafaxine  Hyperlipidemia associated with type 2 diabetes mellitus (HCC) - Simvastatin 20 mg nightly resumed   Obesity, Class III, BMI 40-49.9 (morbid obesity) ([place]) - This complicates overall care and prognosis.  Although, 2 years ago, patient closer to 500 pounds and has lost significant amount of weight.      d/c to [facility] for PM placement. Patient aware       Body mass index is 46.61 kg/m.            Consultants: [facility] cardiology Disposition: [place] Diet recommendation:  Cardiac diet DISCHARGE MEDICATION: Allergies as of 07/29/2022   No Known Allergies      Medication List     STOP taking these medications    metoprolol tartrate 25 MG tablet Commonly known as: LOPRESSOR       TAKE these medications    amLODipine 5 MG tablet Commonly known as: NORVASC TAKE 1 TABLET BY MOUTH ONCE DAILY   Bariatric Multivitamins/Iron Caps Take 1 capsule by mouth daily.   CALCIUM 500 + D PO Take 3 tablets by mouth in the morning and at bedtime.   nystatin powder Commonly known as: MYCOSTATIN/NYSTOP Apply topically 2 (two) times daily as needed.   nystatin-triamcinolone cream Commonly known as: MYCOLOG II Apply 1 application topically 2 (two) times daily.   potassium chloride SA 20 MEQ tablet Commonly known as: KLOR-CON M TAKE ONE AND A HALF TABLETS BY MOUTH EVERY DAY   simvastatin 20 MG tablet Commonly known as: ZOCOR TAKE ONE TABLET AT BEDTIME   triamcinolone cream 0.1 % Commonly known as: KENALOG APPLY TO AFFECTED AREAS TWICE  A DAY IF NEEDED   valsartan 320 MG tablet Commonly known as: DIOVAN TAKE 1 TABLET BY MOUTH ONCE DAILY   venlafaxine XR 75 MG 24 hr capsule Commonly known as: EFFEXOR-XR TAKE THREE CAPSULES DAILY        Discharge Exam: Filed Weights   07/27/22 1620  Weight: 135 kg     Condition at discharge: fair  The results of significant diagnostics from this hospitalization (including imaging, microbiology, ancillary and  laboratory) are listed below for reference.   Imaging Studies: ECHOCARDIOGRAM COMPLETE  Result Date: 07/28/2022    ECHOCARDIOGRAM REPORT   Patient Name:   Blake Garcia Date of Exam: 07/28/2022 Medical Rec #:  [id number]         Height:       67.0 in Accession #:    [id number]        Weight:       297.6 lb Date of Birth:  01/16/1956          BSA:          2.394 m Patient Age:    12 years          BP:           181/90 mmHg Patient Gender: M                 HR:           46 bpm. Exam Location:  [facility] Procedure: 2D Echo, Cardiac Doppler and Color Doppler Indications:     Dyspnea, Heart Block  History:         Patient has prior history of Echocardiogram examinations, most                  recent 11/19/2019. CHF, Arrythmias:RBBB and Bradycardia; Risk                  Factors:Hypertension and Diabetes.  Sonographer:     Wenda Low Referring Phys:  [id number] AMY N COX Diagnosing Phys: Kathlyn Sacramento MD  Sonographer Comments: Patient is obese. IMPRESSIONS  1. Left ventricular ejection fraction, by estimation, is 55 to 60%. The left ventricle has normal function. The left ventricle has no regional wall motion abnormalities. The left ventricular internal cavity size was mildly dilated. There is moderate left ventricular hypertrophy. Left ventricular diastolic parameters are consistent with Grade II diastolic dysfunction (pseudonormalization).  2. Right ventricular systolic function is normal. The right ventricular size is normal. There is normal pulmonary artery systolic pressure.  3. Left atrial size was moderately dilated.  4. Right atrial size was mildly dilated.  5. The mitral valve is normal in structure. Mild to moderate mitral valve regurgitation. No evidence of mitral stenosis.  6. The aortic valve is normal in structure. Aortic valve regurgitation is not visualized. Aortic valve sclerosis is present, with no evidence of aortic valve stenosis.  7. The inferior vena cava is dilated in size with >50%  respiratory variability, suggesting right atrial pressure of 8 mmHg. FINDINGS  Left Ventricle: Left ventricular ejection fraction, by estimation, is 55 to 60%. The left ventricle has normal function. The left ventricle has no regional wall motion abnormalities. The left ventricular internal cavity size was mildly dilated. There is  moderate left ventricular hypertrophy. Left ventricular diastolic parameters are consistent with Grade II diastolic dysfunction (pseudonormalization). Right Ventricle: The right ventricular size is normal. No increase in right ventricular wall thickness. Right ventricular systolic function is normal. There is normal pulmonary artery systolic pressure. The  tricuspid regurgitant velocity is 2.60 m/s, and  with an assumed right atrial pressure of 3 mmHg, the estimated right ventricular systolic pressure is 87.5 mmHg. Left Atrium: Left atrial size was moderately dilated. Right Atrium: Right atrial size was mildly dilated. Pericardium: There is no evidence of pericardial effusion. Mitral Valve: The mitral valve is normal in structure. Mild to moderate mitral valve regurgitation. No evidence of mitral valve stenosis. MV peak gradient, 10.2 mmHg. The mean mitral valve gradient is 5.0 mmHg. Tricuspid Valve: The tricuspid valve is normal in structure. Tricuspid valve regurgitation is trivial. No evidence of tricuspid stenosis. Aortic Valve: The aortic valve is normal in structure. Aortic valve regurgitation is not visualized. Aortic valve sclerosis is present, with no evidence of aortic valve stenosis. Aortic valve mean gradient measures 7.0 mmHg. Aortic valve peak gradient measures 12.7 mmHg. Aortic valve area, by VTI measures 1.92 cm. Pulmonic Valve: The pulmonic valve was normal in structure. Pulmonic valve regurgitation is not visualized. No evidence of pulmonic stenosis. Aorta: The aortic root is normal in size and structure. Venous: The inferior vena cava is dilated in size with greater  than 50% respiratory variability, suggesting right atrial pressure of 8 mmHg. IAS/Shunts: No atrial level shunt detected by color flow Doppler.  LEFT VENTRICLE PLAX 2D LVIDd:         5.80 cm   Diastology LVIDs:         3.60 cm   LV e' medial:    5.98 cm/s LV PW:         1.60 cm   LV E/e' medial:  21.2 LV IVS:        1.50 cm   LV e' lateral:   10.20 cm/s LVOT diam:     2.00 cm   LV E/e' lateral: 12.5 LV SV:         80 LV SV Index:   34 LVOT Area:     3.14 cm  RIGHT VENTRICLE RV Basal diam:  3.90 cm RV Mid diam:    3.20 cm RV S prime:     15.40 cm/s TAPSE (M-mode): 3.3 cm LEFT ATRIUM              Index        RIGHT ATRIUM           Index LA diam:        5.10 cm  2.13 cm/m   RA Area:     20.40 cm LA Vol (A2C):   95.1 ml  39.72 ml/m  RA Volume:   55.70 ml  23.26 ml/m LA Vol (A4C):   100.0 ml 41.76 ml/m LA Biplane Vol: 98.6 ml  41.18 ml/m  AORTIC VALVE                     PULMONIC VALVE AV Area (Vmax):    2.26 cm      PV Vmax:       0.89 m/s AV Area (Vmean):   1.97 cm      PV Peak grad:  3.2 mmHg AV Area (VTI):     1.92 cm AV Vmax:           178.00 cm/s AV Vmean:          119.000 cm/s AV VTI:            0.419 m AV Peak Grad:      12.7 mmHg AV Mean Grad:      7.0 mmHg LVOT Vmax:  128.00 cm/s LVOT Vmean:        74.600 cm/s LVOT VTI:          0.256 m LVOT/AV VTI ratio: 0.61  AORTA Ao Root diam: 3.30 cm Ao Asc diam:  3.40 cm MITRAL VALVE                TRICUSPID VALVE MV Area (PHT): 4.26 cm     TR Peak grad:   27.0 mmHg MV Area VTI:   1.64 cm     TR Vmax:        260.00 cm/s MV Peak grad:  10.2 mmHg MV Mean grad:  5.0 mmHg     SHUNTS MV Vmax:       1.60 m/s     Systemic VTI:  0.26 m MV Vmean:      103.0 cm/s   Systemic Diam: 2.00 cm MV Decel Time: 178 msec MR Peak grad: 112.4 mmHg MR Mean grad: 66.0 mmHg MR Vmax:      530.00 cm/s MR Vmean:     377.0 cm/s MV E velocity: 127.00 cm/s MV A velocity: 162.00 cm/s MV E/A ratio:  0.78 Kathlyn Sacramento MD Electronically signed by Kathlyn Sacramento MD Signature Date/Time:  07/28/2022/11:51:05 AM    Final    CT Angio Chest Pulmonary Embolism (PE) W or WO Contrast  Result Date: 07/27/2022 CLINICAL DATA:  Positive D-dimer, bradycardia, chest pain EXAM: CT ANGIOGRAPHY CHEST WITH CONTRAST TECHNIQUE: Multidetector CT imaging of the chest was performed using the standard protocol during bolus administration of intravenous contrast. Multiplanar CT image reconstructions and MIPs were obtained to evaluate the vascular anatomy. RADIATION DOSE REDUCTION: This exam was performed according to the departmental dose-optimization program which includes automated exposure control, adjustment of the mA and/or kV according to patient size and/or use of iterative reconstruction technique. CONTRAST:  188m OMNIPAQUE IOHEXOL 350 MG/ML SOLN COMPARISON:  07/27/2022 FINDINGS: Cardiovascular: This is a technically adequate evaluation of the pulmonary vasculature. Evaluation of the upper lung zones is limited due to respiratory motion. No filling defects or pulmonary emboli. The heart is unremarkable without pericardial effusion. No evidence of thoracic aortic aneurysm or dissection. Atherosclerosis of the aorta and coronary vasculature, greatest in the LAD distribution. Mediastinum/Nodes: Thyroid, trachea, and esophagus are unremarkable. Borderline enlarged mediastinal lymph nodes are nonspecific, largest measuring 12 mm in the pretracheal region. Lungs/Pleura: Small bilateral pleural effusions, volume estimated less than 1 L each. Dependent bilateral lower lobe atelectasis. Central vascular congestion. No airspace disease or pneumothorax. The central airways are patent. Upper Abdomen: No acute abnormality. Musculoskeletal: No acute or destructive bony lesions. Reconstructed images demonstrate no additional findings. Review of the MIP images confirms the above findings. IMPRESSION: 1. No evidence of pulmonary embolus. 2. Central vascular congestion and bilateral pleural effusions. 3. Aortic Atherosclerosis  (ICD10-I70.0). Coronary artery atherosclerosis. 4. Borderline enlarged mediastinal lymph nodes, likely reactive. Electronically Signed   By: MRanda NgoM.D.   On: 07/27/2022 21:00   DG Chest 2 View  Result Date: 07/27/2022 CLINICAL DATA:  Bradycardia, chest pain EXAM: CHEST - 2 VIEW COMPARISON:  02/12/2012 FINDINGS: Normal cardiac silhouette. Interval increase in venous congestion and interstitial edema pattern. Small bilateral pleural effusion. No focal consolidation. IMPRESSION: Increasing interstitial edema and effusions. Electronically Signed   By: SSuzy BouchardM.D.   On: 07/27/2022 16:51    Microbiology: Results for orders placed or performed during the hospital encounter of 07/27/20  SARS CORONAVIRUS 2 (TAT 6-24 HRS) Nasopharyngeal Nasopharyngeal Swab     Status:  None   Collection Time: 07/27/20 10:26 AM   Specimen: Nasopharyngeal Swab  Result Value Ref Range Status   SARS Coronavirus 2 NEGATIVE NEGATIVE Final    Comment: (NOTE) SARS-CoV-2 target nucleic acids are NOT DETECTED.  The SARS-CoV-2 RNA is generally detectable in upper and lower respiratory specimens during the acute phase of infection. Negative results do not preclude SARS-CoV-2 infection, do not rule out co-infections with other pathogens, and should not be used as the sole basis for treatment or other patient management decisions. Negative results must be combined with clinical observations, patient history, and epidemiological information. The expected result is Negative.  Fact Sheet for Patients: SugarRoll.be  Fact Sheet for Healthcare Providers: https://www.woods-mathews.com/  This test is not yet approved or cleared by the [place] FDA and  has been authorized for detection and/or diagnosis of SARS-CoV-2 by FDA under an Emergency Use Authorization (EUA). This EUA will remain  in effect (meaning this test can be used) for the duration of the COVID-19  declaration under Se ction 564(b)(1) of the Act, 21 U.S.C. section 360bbb-3(b)(1), unless the authorization is terminated or revoked sooner.  Performed at [facility], [place] [address], [place], [place] [address]     Labs: CBC: Recent Labs  Lab 07/27/22 1642 07/28/22 0551  WBC 7.5 7.2  HGB 14.7 13.1  HCT 44.4 39.9  MCV 90.6 90.9  PLT 270 709   Basic Metabolic Panel: Recent Labs  Lab 07/27/22 1642 07/27/22 1848 07/28/22 0551 07/28/22 2048 07/29/22 0458  NA 140  --  139 137 138  K 3.4*  --  3.1* 3.6 3.5  CL 105  --  104 101 104  CO2 28  --  '27 29 24  '$ GLUCOSE 110*  --  100* 116* 96  BUN 14  --  '12 14 12  '$ CREATININE 0.84  --  0.70 0.83 0.59*  CALCIUM 8.9  --  8.4* 8.6* 8.7*  MG  --  2.0  --  2.0  --     Discharge time spent: greater than 30 minutes.  Signed: Fritzi Mandes, MD [facility] 07/29/2022

## 2022-07-29 NOTE — Interval H&P Note (Signed)
History and Physical Interval Note:  07/29/2022 10:56 AM  Blake Garcia  has presented today for surgery, with the diagnosis of bradycardia.  The various methods of treatment have been discussed with the patient and family. After consideration of risks, benefits and other options for treatment, the patient has consented to  Procedure(s): PACEMAKER IMPLANT (N/A) as a surgical intervention.  The patient's history has been reviewed, patient examined, no change in status, stable for surgery.  I have reviewed the patient's chart and labs.  Questions were answered to the patient's satisfaction.     Cristopher Peru

## 2022-07-29 NOTE — Discharge Instructions (Addendum)
     Supplemental Discharge Instructions for  Pacemaker/Defibrillator Patients   Activity No heavy lifting or vigorous activity with your left/right arm for 6 to 8 weeks.  Do not raise your left/right arm above your head for one week.  Gradually raise your affected arm as drawn below.              08/03/22                      08/04/22                      08/05/22                    08/06/22 __  NO DRIVING for  1 week   ; you may begin driving on   02/02/09  .  WOUND CARE Keep the wound area clean and dry.  Do not get this area wet , no showers for one week; you may shower on  08/06/22   . The tape/steri-strips on your wound will fall off; do not pull them off.  No bandage is needed on the site.  DO  NOT apply any creams, oils, or ointments to the wound area. If you notice any drainage or discharge from the wound, any swelling or bruising at the site, or you develop a fever > 101? F after you are discharged home, call the office at once.  Special Instructions You are still able to use cellular telephones; use the ear opposite the side where you have your pacemaker/defibrillator.  Avoid carrying your cellular phone near your device. When traveling through airports, show security personnel your identification card to avoid being screened in the metal detectors.  Ask the security personnel to use the hand wand. Avoid arc welding equipment, MRI testing (magnetic resonance imaging), TENS units (transcutaneous nerve stimulators).  Call the office for questions about other devices. Avoid electrical appliances that are in poor condition or are not properly grounded. Microwave ovens are safe to be near or to operate.

## 2022-07-29 NOTE — Care Management Important Message (Signed)
Important Message  Patient Details  Name: Blake Garcia MRN: [id number] Date of Birth: 01-02-1956   Medicare Important Message Given:  N/A - LOS <3 / Initial given by admissions     Dannette Barbara 07/29/2022, 9:26 AM

## 2022-07-29 NOTE — TOC CM/SW Note (Signed)
  Transition of Care [facility]) Screening Note   Patient Details  Name: Blake Garcia Date of Birth: 06/26/1956   Transition of Care [facility]) CM/SW Contact:    Gerilyn Pilgrim, LCSW Phone Number: 07/29/2022, 9:17 AM    Transition of Care Department [facility]) has reviewed patient and no TOC needs have been identified at this time. We will continue to monitor patient advancement through interdisciplinary progression rounds. If new patient transition needs arise, please place a TOC consult.

## 2022-07-30 DIAGNOSIS — I110 Hypertensive heart disease with heart failure: Secondary | ICD-10-CM | POA: Diagnosis not present

## 2022-07-30 DIAGNOSIS — I3139 Other pericardial effusion (noninflammatory): Secondary | ICD-10-CM | POA: Diagnosis not present

## 2022-07-30 DIAGNOSIS — I442 Atrioventricular block, complete: Secondary | ICD-10-CM | POA: Diagnosis not present

## 2022-07-30 DIAGNOSIS — Z85828 Personal history of other malignant neoplasm of skin: Secondary | ICD-10-CM | POA: Diagnosis not present

## 2022-07-30 DIAGNOSIS — G4733 Obstructive sleep apnea (adult) (pediatric): Secondary | ICD-10-CM | POA: Diagnosis not present

## 2022-07-30 DIAGNOSIS — I5033 Acute on chronic diastolic (congestive) heart failure: Secondary | ICD-10-CM | POA: Diagnosis not present

## 2022-07-30 DIAGNOSIS — Z950 Presence of cardiac pacemaker: Secondary | ICD-10-CM | POA: Diagnosis not present

## 2022-07-30 DIAGNOSIS — Z79899 Other long term (current) drug therapy: Secondary | ICD-10-CM | POA: Diagnosis not present

## 2022-07-30 DIAGNOSIS — E119 Type 2 diabetes mellitus without complications: Secondary | ICD-10-CM | POA: Diagnosis not present

## 2022-07-30 LAB — THYROID PANEL WITH TSH
Free Thyroxine Index: 2.5 (ref 1.2–4.9)
T3 Uptake Ratio: 30 % (ref 24–39)
T4, Total: 8.4 ug/dL (ref 4.5–12.0)
TSH: 3.5 u[IU]/mL (ref 0.450–4.500)

## 2022-07-30 NOTE — Discharge Summary (Signed)
ELECTROPHYSIOLOGY PROCEDURE DISCHARGE SUMMARY    Patient ID: Blake Garcia,  MRN: [id number], DOB/AGE: 03-03-1956 66 y.o.  Admit date: 07/29/2022 Discharge date: 07/30/2022  Primary Care Physician: Jinny Sanders, MD  Primary Cardiologist: Kathlyn Sacramento, MD  Electrophysiologist: Dr. Caryl Comes   Primary Discharge Diagnosis:  Complete Heart Block status post pacemaker implantation this admission  Secondary Discharge Diagnosis:  HTN Diastolic HF  No Known Allergies   Procedures This Admission:  1.  Implantation of a Abbott DDD PM Post-procedural complications included transient hypotension    2.  CXR on 07/30/2022 demonstrated no pneumothorax status post device implantation.       Brief HPI: Blake Garcia is a 66 y.o. male was admitted for complete heart block and electrophysiology team asked to see for consideration of PPM implantation.  Past medical history includes HTN, obesity, OSA, DM and diastolic HF.  The patient has had sinus node dysfunction without reversible causes identified.  Risks, benefits, and alternatives to PPM implantation were reviewed with the patient who wished to proceed.  PE On exam today he is a pleasant well appearing 66 yo man with no hematoma, clear lungs and CV reveals a RRR and tele demonstrates NSR with appropriate pacing.  Hospital Course:  The patient was admitted and underwent implantation of a [employer]. Jude dual chamber PPM with details as outlined above.  He was monitored on telemetry overnight which demonstrated appropriate pacing.  Left chest was without hematoma or ecchymosis.  The device was interrogated and found to be functioning normally.  CXR was obtained and demonstrated no pneumothorax status post device implantation.  Wound care, arm mobility, and restrictions were reviewed with the patient.  The patient was examined and considered stable for discharge to home.    Anticoagulation resumption This patient is not on  anticoagulation    Physical Exam: Vitals:   07/29/22 1948 07/29/22 2318 07/30/22 0327 07/30/22 0804  BP: (!) 151/90 (!) 148/88 (!) 160/88 (!) 168/94  Pulse: 100 91 81 86  Resp: '20 19 16 20  '$ Temp: 98.7 F (37.1 C) 97.8 F (36.6 C) 98.4 F (36.9 C) 98.2 F (36.8 C)  TempSrc: Oral Oral Oral Oral  SpO2: 91% 95% 97% 96%   Physical Exam per MD  Labs:   Lab Results  Component Value Date   WBC 7.2 07/28/2022   HGB 13.1 07/28/2022   HCT 39.9 07/28/2022   MCV 90.9 07/28/2022   PLT 226 07/28/2022    Recent Labs  Lab 07/29/22 0458  NA 138  K 3.5  CL 104  CO2 24  BUN 12  CREATININE 0.59*  CALCIUM 8.7*  GLUCOSE 96    Discharge Medications:  Allergies as of 07/30/2022   No Known Allergies      Medication List     TAKE these medications    amLODipine 5 MG tablet Commonly known as: NORVASC TAKE 1 TABLET BY MOUTH ONCE DAILY   Bariatric Multivitamins/Iron Caps Take 1 capsule by mouth daily.   CALCIUM 500 + D PO Take 3 tablets by mouth every evening.   metoprolol tartrate 25 MG tablet Commonly known as: LOPRESSOR Take 25 mg by mouth 2 (two) times daily.   nystatin powder Commonly known as: MYCOSTATIN/NYSTOP Apply 1 Application topically 2 (two) times daily as needed (for dryness).   nystatin-triamcinolone cream Commonly known as: MYCOLOG II Apply 1 application topically 2 (two) times daily.   potassium chloride SA 20 MEQ tablet Commonly known as: KLOR-CON M TAKE ONE  AND A HALF TABLETS BY MOUTH EVERY DAY What changed: See the new instructions.   simvastatin 20 MG tablet Commonly known as: ZOCOR TAKE ONE TABLET AT BEDTIME What changed: when to take this   triamcinolone cream 0.1 % Commonly known as: KENALOG APPLY TO AFFECTED AREAS TWICE A DAY IF NEEDED What changed:  how much to take how to take this when to take this reasons to take this additional instructions   valsartan 320 MG tablet Commonly known as: DIOVAN TAKE 1 TABLET BY MOUTH ONCE  DAILY   venlafaxine XR 75 MG 24 hr capsule Commonly known as: EFFEXOR-XR TAKE THREE CAPSULES DAILY What changed:  how much to take how to take this when to take this additional instructions        Disposition:  Discharge Instructions     Call MD for:  redness, tenderness, or signs of infection (pain, swelling, redness, odor or green/yellow discharge around incision site)   Complete by: As directed    Diet - low sodium heart healthy   Complete by: As directed    Increase activity slowly   Complete by: As directed        Follow-up [place] Follow up on 08/11/2022.   Specialty: Cardiology Why: at 11:20am for your wound check in the office Contact information: [address], [address] [place]        Evans Lance, MD Follow up on 11/09/2022.   Specialty: Cardiology Why: at 3:45pm for your follow up appt Contact information: [address] [place] [address] [phone]                 Duration of Discharge Encounter: Greater than 30 minutes including physician time.  Signed, Reino Bellis, NP  07/30/2022 10:08 AM   EP Attending  Patient seen and examined. See my addition to exam above. His CXR looks good and his PPM Interrogation under my direction demonstrates normal DDD PM function. Ok to DC home with usual followup as outlined above.  Carleene Overlie Shawnita Krizek,MD

## 2022-08-05 ENCOUNTER — Other Ambulatory Visit: Payer: Self-pay | Admitting: Cardiovascular Disease

## 2022-08-11 ENCOUNTER — Ambulatory Visit: Payer: Medicare Other | Attending: Cardiology

## 2022-08-11 DIAGNOSIS — I442 Atrioventricular block, complete: Secondary | ICD-10-CM | POA: Diagnosis not present

## 2022-08-11 LAB — CUP PACEART INCLINIC DEVICE CHECK
Battery Remaining Longevity: 69 mo
Battery Voltage: 3.02 V
Brady Statistic RA Percent Paced: 18 %
Brady Statistic RV Percent Paced: 97 %
Date Time Interrogation Session: 20240111122508
Implantable Lead Connection Status: 753985
Implantable Lead Connection Status: 753985
Implantable Lead Implant Date: 20231229
Implantable Lead Implant Date: 20231229
Implantable Lead Location: 753859
Implantable Lead Location: 753860
Implantable Pulse Generator Implant Date: 20231229
Lead Channel Impedance Value: 475 Ohm
Lead Channel Impedance Value: 475 Ohm
Lead Channel Pacing Threshold Amplitude: 0.75 V
Lead Channel Pacing Threshold Amplitude: 0.75 V
Lead Channel Pacing Threshold Amplitude: 1 V
Lead Channel Pacing Threshold Amplitude: 1 V
Lead Channel Pacing Threshold Pulse Width: 0.5 ms
Lead Channel Pacing Threshold Pulse Width: 0.5 ms
Lead Channel Pacing Threshold Pulse Width: 0.5 ms
Lead Channel Pacing Threshold Pulse Width: 0.5 ms
Lead Channel Sensing Intrinsic Amplitude: 3.2 mV
Lead Channel Sensing Intrinsic Amplitude: 8 mV
Lead Channel Setting Pacing Amplitude: 3.5 V
Lead Channel Setting Pacing Amplitude: 3.5 V
Lead Channel Setting Pacing Pulse Width: 0.5 ms
Lead Channel Setting Sensing Sensitivity: 2 mV
Pulse Gen Model: 2272
Pulse Gen Serial Number: 8139537

## 2022-08-11 NOTE — Progress Notes (Signed)
Wound check appointment. Steri-strips removed. Wound without redness.  Slight soft edema over incision.   No bruising noted.  Pt is not on ASA or blood thinner.  Incision edges approximated, wound well healed. Normal device function. Thresholds, sensing, and impedances consistent with implant measurements. Device programmed at 3.5V/auto capture programmed on for extra safety margin until 3 month visit. Histogram distribution appropriate for patient and level of activity. AMS episode recorded lasting 1 minute and 44 seconds.  No high ventricular rates noted. Patient educated about wound care, arm mobility, lifting restrictions. ROV in 3 months with implanting physician.

## 2022-08-11 NOTE — Patient Instructions (Signed)

## 2022-09-29 ENCOUNTER — Other Ambulatory Visit: Payer: Self-pay | Admitting: *Deleted

## 2022-09-29 DIAGNOSIS — F3342 Major depressive disorder, recurrent, in full remission: Secondary | ICD-10-CM

## 2022-09-29 MED ORDER — VENLAFAXINE HCL ER 75 MG PO CP24: ORAL_CAPSULE | ORAL | 0 refills | Status: DC

## 2022-09-29 NOTE — Telephone Encounter (Signed)
Spoke to pt, scheduled cpe for 12/20/22

## 2022-09-29 NOTE — Telephone Encounter (Signed)
Please call and schedule Medicare Wellness with nurse and CPE with fasting labs prior with Dr. Diona Browner after 12/18/2022.  Refill for Venlafaxine ER 75 mg printed and faxed to [facility] at [phone].

## 2022-10-28 ENCOUNTER — Ambulatory Visit (INDEPENDENT_AMBULATORY_CARE_PROVIDER_SITE_OTHER): Payer: Medicare Other

## 2022-10-28 DIAGNOSIS — I442 Atrioventricular block, complete: Secondary | ICD-10-CM | POA: Diagnosis not present

## 2022-10-28 LAB — CUP PACEART REMOTE DEVICE CHECK
Battery Remaining Longevity: 64 mo
Battery Remaining Percentage: 95.5 %
Battery Voltage: 2.98 V
Brady Statistic AP VP Percent: 15 %
Brady Statistic AP VS Percent: 1 %
Brady Statistic AS VP Percent: 81 %
Brady Statistic AS VS Percent: 2.1 %
Brady Statistic RA Percent Paced: 13 %
Brady Statistic RV Percent Paced: 96 %
Date Time Interrogation Session: 20240329020013
Implantable Lead Connection Status: 753985
Implantable Lead Connection Status: 753985
Implantable Lead Implant Date: 20231229
Implantable Lead Implant Date: 20231229
Implantable Lead Location: 753859
Implantable Lead Location: 753860
Implantable Pulse Generator Implant Date: 20231229
Lead Channel Impedance Value: 480 Ohm
Lead Channel Impedance Value: 530 Ohm
Lead Channel Pacing Threshold Amplitude: 0.75 V
Lead Channel Pacing Threshold Amplitude: 1 V
Lead Channel Pacing Threshold Pulse Width: 0.5 ms
Lead Channel Pacing Threshold Pulse Width: 0.5 ms
Lead Channel Sensing Intrinsic Amplitude: 12 mV
Lead Channel Sensing Intrinsic Amplitude: 2.7 mV
Lead Channel Setting Pacing Amplitude: 3.5 V
Lead Channel Setting Pacing Amplitude: 3.5 V
Lead Channel Setting Pacing Pulse Width: 0.5 ms
Lead Channel Setting Sensing Sensitivity: 2 mV
Pulse Gen Model: 2272
Pulse Gen Serial Number: 8139537

## 2022-11-09 ENCOUNTER — Ambulatory Visit: Payer: Medicare Other | Attending: Internal Medicine | Admitting: Internal Medicine

## 2022-11-09 VITALS — BP 148/96 | HR 77 | Resp 20 | Wt 294.0 lb

## 2022-11-09 DIAGNOSIS — I442 Atrioventricular block, complete: Secondary | ICD-10-CM | POA: Diagnosis not present

## 2022-11-09 DIAGNOSIS — Z950 Presence of cardiac pacemaker: Secondary | ICD-10-CM

## 2022-11-09 DIAGNOSIS — I493 Ventricular premature depolarization: Secondary | ICD-10-CM

## 2022-11-09 DIAGNOSIS — G4733 Obstructive sleep apnea (adult) (pediatric): Secondary | ICD-10-CM | POA: Diagnosis not present

## 2022-11-09 HISTORY — DX: Presence of cardiac pacemaker: Z95.0

## 2022-11-09 NOTE — Progress Notes (Signed)
HPI Blake Garcia returns today for followup. He is a pleasant 67 yo man with a h/o high grade AV block, HTN, morbid obesity who underwent insertion of  a St. Jude DDD PM about 3 months ago. In the interim he notes he feels well. He chronic edema is much improved. No chest pain, sob, or syncope.  No Known Allergies   Current Outpatient Medications  Medication Sig Dispense Refill   amLODipine (NORVASC) 5 MG tablet TAKE 1 TABLET BY MOUTH ONCE DAILY 90 tablet 0   Calcium Carb-Cholecalciferol (CALCIUM 500 + D PO) Take 3 tablets by mouth every evening.     metoprolol tartrate (LOPRESSOR) 25 MG tablet Take 25 mg by mouth 2 (two) times daily.     Multiple Vitamins-Minerals (BARIATRIC MULTIVITAMINS/IRON) CAPS Take 1 capsule by mouth daily.     nystatin (MYCOSTATIN/NYSTOP) powder Apply 1 Application topically 2 (two) times daily as needed (for dryness).     nystatin-triamcinolone (MYCOLOG II) cream Apply 1 application topically 2 (two) times daily. 30 g 0   potassium chloride SA (KLOR-CON M) 20 MEQ tablet TAKE ONE AND A HALF TABLETS BY MOUTH EVERY DAY (Patient taking differently: 30 mEq daily. TAKE ONE AND A HALF TABLETS BY MOUTH EVERY DAY) 135 tablet 1   simvastatin (ZOCOR) 20 MG tablet TAKE ONE TABLET AT BEDTIME (Patient taking differently: Take 20 mg by mouth every evening.) 90 tablet 3   triamcinolone cream (KENALOG) 0.1 % APPLY TO AFFECTED AREAS TWICE A DAY IF NEEDED (Patient taking differently: Apply 1 Application topically daily as needed (For rash).) 100 g 0   valsartan (DIOVAN) 320 MG tablet TAKE 1 TABLET BY MOUTH ONCE DAILY (Patient taking differently: Take 320 mg by mouth daily.) 90 tablet 3   venlafaxine XR (EFFEXOR-XR) 75 MG 24 hr capsule TAKE THREE CAPSULES DAILY 270 capsule 0   No current facility-administered medications for this visit.     Past Medical History:  Diagnosis Date   Basal cell carcinoma [id number]   [facility]   Cellulitis 02/09/2012   RLE    Complication of anesthesia ~ 2000   "during OR for kidney stones; anesthesia RX made me code twice in one week"   Depression    Diastolic dysfunction    a. 11/2014 Echo: EF 50-55%, Gr 1 DD.   History of blood transfusion 1958   "I was an Rh baby"   History of kidney stones    Hyperlipidemia    Hypertension    Kidney stones    Midsternal chest pain    Morbid obesity (HCC)    On home oxygen therapy    OSA (obstructive sleep apnea)    "wear CPAP"   Personal history of colonic adenoma 09/14/2012   Pre-diabetes     ROS:   All systems reviewed and negative except as noted in the HPI.   Past Surgical History:  Procedure Laterality Date   CHOLECYSTECTOMY  1993   COLONOSCOPY     COLONOSCOPY WITH PROPOFOL N/A 07/28/2020   Procedure: COLONOSCOPY WITH PROPOFOL;  Surgeon: Iva BoopGessner, Carl E, MD;  Location: [facility];  Service: Endoscopy;  Laterality: N/A;   ESOPHAGOGASTRODUODENOSCOPY     KNEE ARTHROSCOPY  ~ 2004   right   LAPAROSCOPY     Surgical sleeve   LITHOTRIPSY  2000   PACEMAKER IMPLANT N/A 07/29/2022   Procedure: PACEMAKER IMPLANT;  Surgeon: Marinus Mawaylor, Kinzie Wickes W, MD;  Location: [facility];  Service: Cardiovascular;  Laterality: N/A;  POLYPECTOMY  07/28/2020   Procedure: POLYPECTOMY;  Surgeon: Iva Boop, MD;  Location: [facility];  Service: Endoscopy;;     Family History  Problem Relation Age of Onset   Hypertension Mother    Heart disease Mother    Heart disease Father    Stroke Father    Heart disease Brother    Coronary artery disease Brother    Diabetes Brother    Cancer Maternal Aunt        breast   Cancer Maternal Uncle        lung   Heart disease Brother    Coronary artery disease Brother    ADD / ADHD Son    ADD / ADHD Son    ADD / ADHD Daughter      Social History   Socioeconomic History   Marital status: Divorced    Spouse name: Not on file   Number of children: 3   Years of education: Not on file   Highest education level: Some  college, no degree  Occupational History   Occupation: not employed  Tobacco Use   Smoking status: Never   Smokeless tobacco: Never  Vaping Use   Vaping Use: Never used  Substance and Sexual Activity   Alcohol use: No    Alcohol/week: 0.0 standard drinks of alcohol    Comment: 02/09/12 "may have driink at party once/year"   Drug use: No   Sexual activity: Not Currently  Other Topics Concern   Not on file  Social History Narrative   Uses a scooter for ambulation   He is disabled   He is divorced and has 3 children   No alcohol never smoker no drug use no current tobacco   Social Determinants of Health   Financial Resource Strain: Low Risk  (03/09/2022)   Overall Financial Resource Strain (CARDIA)    Difficulty of Paying Living Expenses: Not hard at all  Food Insecurity: No Food Insecurity (03/09/2022)   Hunger Vital Sign    Worried About Running Out of Food in the Last Year: Never true    Ran Out of Food in the Last Year: Never true  Transportation Needs: No Transportation Needs (03/09/2022)   PRAPARE - Administrator, Civil Service (Medical): No    Lack of Transportation (Non-Medical): No  Physical Activity: Sufficiently Active (03/09/2022)   Exercise Vital Sign    Days of Exercise per Week: 4 days    Minutes of Exercise per Session: 60 min  Stress: No Stress Concern Present (03/09/2022)   [employer] of Occupational Health - Occupational Stress Questionnaire    Feeling of Stress : Not at all  Social Connections: Moderately Integrated (03/09/2022)   Social Connection and Isolation Panel [NHANES]    Frequency of Communication with Friends and Family: More than three times a week    Frequency of Social Gatherings with Friends and Family: Once a week    Attends Religious Services: More than 4 times per year    Active Member of [employer] or Organizations: Yes    Attends Engineer, structural: More than 4 times per year    Marital Status: Divorced  Intimate  Partner Violence: Not At Risk (03/09/2022)   Humiliation, Afraid, Rape, and Kick questionnaire    Fear of Current or Ex-Partner: No    Emotionally Abused: No    Physically Abused: No    Sexually Abused: No     BP (!) 148/96 (BP Location: Left Arm)  Pulse 77   Resp 20   Wt 294 lb 0.2 oz (133.4 kg)   SpO2 96%   BMI 46.05 kg/m   Physical Exam:  Well appearing NAD HEENT: Unremarkable Neck:  No JVD, no thyromegally Lymphatics:  No adenopathy Back:  No CVA tenderness Lungs:  Clear with no wheezes HEART:  Regular rate rhythm, no murmurs, no rubs, no clicks Abd:  soft, positive bowel sounds, no organomegally, no rebound, no guarding Ext:  2 plus pulses, no edema, no cyanosis, no clubbing Skin:  No rashes no nodules Neuro:  CN II through XII intact, motor grossly intact  EKG - nsr with ventricular pacing  DEVICE  Normal device function.  See PaceArt for details.   Assess/Plan: Symptomatic high grade AV block - he is asymptomatic s/p PPM insertion. PPM - his St. Jude DDD PM is working normally. His outputs were turned down today to maximize device longevity. Obesity - he continues to lose weight. HTN - his bp is well controlled.   Sharlot Gowda Lorren Rossetti,MD

## 2022-11-09 NOTE — Patient Instructions (Signed)
Medication Instructions:  Your physician recommends that you continue on your current medications as directed. Please refer to the Current Medication list given to you today.  *If you need a refill on your cardiac medications before your next appointment, please call your pharmacy*  Lab Work: None ordered.  If you have labs (blood work) drawn today and your tests are completely normal, you will receive your results only by: MyChart Message (if you have MyChart) OR A paper copy in the mail If you have any lab test that is abnormal or we need to change your treatment, we will call you to review the results.  Testing/Procedures: None ordered.  Follow-Up: At [facility], you and your health needs are our priority.  As part of our continuing mission to provide you with exceptional heart care, we have created designated Provider Care Teams.  These Care Teams include your primary Cardiologist (physician) and Advanced Practice Providers (APPs -  Physician Assistants and Nurse Practitioners) who all work together to provide you with the care you need, when you need it.   Your next appointment:   1 year(s)  The format for your next appointment:   In Person  Provider:   Lewayne Bunting, MD{or one of the following Advanced Practice Providers on your designated Care Team:   Francis Dowse, [place] Casimiro Needle "Mardelle Matte" Lanna Poche, [place]  Remote monitoring is used to monitor your Pacemaker from home. This monitoring reduces the number of office visits required to check your device to one time per year. It allows [name] to keep an eye on the functioning of your device to ensure it is working properly. You are scheduled for a device check from home on 01/27/23. You may send your transmission at any time that day. If you have a wireless device, the transmission will be sent automatically. After your physician reviews your transmission, you will receive a postcard with your next transmission date.

## 2022-11-10 ENCOUNTER — Other Ambulatory Visit: Payer: Self-pay | Admitting: Cardiology

## 2022-11-15 DIAGNOSIS — H5213 Myopia, bilateral: Secondary | ICD-10-CM | POA: Diagnosis not present

## 2022-11-15 DIAGNOSIS — H52223 Regular astigmatism, bilateral: Secondary | ICD-10-CM | POA: Diagnosis not present

## 2022-11-15 DIAGNOSIS — Z7984 Long term (current) use of oral hypoglycemic drugs: Secondary | ICD-10-CM | POA: Diagnosis not present

## 2022-11-15 DIAGNOSIS — H2513 Age-related nuclear cataract, bilateral: Secondary | ICD-10-CM | POA: Diagnosis not present

## 2022-11-15 DIAGNOSIS — E119 Type 2 diabetes mellitus without complications: Secondary | ICD-10-CM | POA: Diagnosis not present

## 2022-11-15 DIAGNOSIS — H524 Presbyopia: Secondary | ICD-10-CM | POA: Diagnosis not present

## 2022-11-15 DIAGNOSIS — H35033 Hypertensive retinopathy, bilateral: Secondary | ICD-10-CM | POA: Diagnosis not present

## 2022-11-15 LAB — HM DIABETES EYE EXAM

## 2022-11-15 NOTE — Addendum Note (Signed)
Addended by: Anselm Pancoast on: 11/15/2022 09:10 AM   Modules accepted: Orders

## 2022-11-30 NOTE — Progress Notes (Signed)
Remote pacemaker transmission.   

## 2022-12-07 ENCOUNTER — Telehealth: Payer: Self-pay | Admitting: Family Medicine

## 2022-12-07 DIAGNOSIS — Z125 Encounter for screening for malignant neoplasm of prostate: Secondary | ICD-10-CM

## 2022-12-07 DIAGNOSIS — Z9884 Bariatric surgery status: Secondary | ICD-10-CM

## 2022-12-07 DIAGNOSIS — E118 Type 2 diabetes mellitus with unspecified complications: Secondary | ICD-10-CM

## 2022-12-07 NOTE — Telephone Encounter (Signed)
-----   Message from Alvina Chou sent at 12/05/2022 12:59 PM EDT ----- Regarding: Lab orders for Tuesday, 5.14.24 Patient is scheduled for CPX labs, please order future labs, Thanks , Camelia Eng

## 2022-12-13 ENCOUNTER — Other Ambulatory Visit (INDEPENDENT_AMBULATORY_CARE_PROVIDER_SITE_OTHER): Payer: Medicare Other

## 2022-12-13 DIAGNOSIS — Z9884 Bariatric surgery status: Secondary | ICD-10-CM

## 2022-12-13 DIAGNOSIS — Z125 Encounter for screening for malignant neoplasm of prostate: Secondary | ICD-10-CM | POA: Diagnosis not present

## 2022-12-13 DIAGNOSIS — E118 Type 2 diabetes mellitus with unspecified complications: Secondary | ICD-10-CM | POA: Diagnosis not present

## 2022-12-13 LAB — CBC WITH DIFFERENTIAL/PLATELET
Basophils Absolute: 0.1 10*3/uL (ref 0.0–0.1)
Basophils Relative: 1.1 % (ref 0.0–3.0)
Eosinophils Absolute: 0.3 10*3/uL (ref 0.0–0.7)
Eosinophils Relative: 4.3 % (ref 0.0–5.0)
HCT: 40.6 % (ref 39.0–52.0)
Hemoglobin: 13.5 g/dL (ref 13.0–17.0)
Lymphocytes Relative: 30.6 % (ref 12.0–46.0)
Lymphs Abs: 2.1 10*3/uL (ref 0.7–4.0)
MCHC: 33.3 g/dL (ref 30.0–36.0)
MCV: 90.6 fl (ref 78.0–100.0)
Monocytes Absolute: 0.6 10*3/uL (ref 0.1–1.0)
Monocytes Relative: 9 % (ref 3.0–12.0)
Neutro Abs: 3.7 10*3/uL (ref 1.4–7.7)
Neutrophils Relative %: 55 % (ref 43.0–77.0)
Platelets: 208 10*3/uL (ref 150.0–400.0)
RBC: 4.48 Mil/uL (ref 4.22–5.81)
RDW: 15.3 % (ref 11.5–15.5)
WBC: 6.8 10*3/uL (ref 4.0–10.5)

## 2022-12-13 LAB — HEMOGLOBIN A1C: Hgb A1c MFr Bld: 5.2 % (ref 4.6–6.5)

## 2022-12-13 LAB — LIPID PANEL
Cholesterol: 118 mg/dL (ref 0–200)
HDL: 47.3 mg/dL (ref >39.00)
LDL Cholesterol: 52 mg/dL (ref 0–99)
NonHDL: 70.25
Total CHOL/HDL Ratio: 2
Triglycerides: 91 mg/dL (ref 0.0–149.0)
VLDL: 18.2 mg/dL (ref 0.0–40.0)

## 2022-12-13 LAB — COMPREHENSIVE METABOLIC PANEL
ALT: 19 U/L (ref 0–53)
AST: 22 U/L (ref 0–37)
Albumin: 3.7 g/dL (ref 3.5–5.2)
Alkaline Phosphatase: 92 U/L (ref 39–117)
BUN: 12 mg/dL (ref 6–23)
CO2: 29 mEq/L (ref 19–32)
Calcium: 8.8 mg/dL (ref 8.4–10.5)
Chloride: 104 mEq/L (ref 96–112)
Creatinine, Ser: 0.82 mg/dL (ref 0.40–1.50)
GFR: 91.74 mL/min (ref >60.00)
Glucose, Bld: 96 mg/dL (ref 70–99)
Potassium: 3.6 mEq/L (ref 3.5–5.1)
Sodium: 141 mEq/L (ref 135–145)
Total Bilirubin: 0.4 mg/dL (ref 0.2–1.2)
Total Protein: 6.7 g/dL (ref 6.0–8.3)

## 2022-12-13 LAB — VITAMIN D 25 HYDROXY (VIT D DEFICIENCY, FRACTURES): VITD: 25.43 ng/mL — ABNORMAL LOW (ref 30.00–100.00)

## 2022-12-13 LAB — VITAMIN B12: Vitamin B-12: 1401 pg/mL — ABNORMAL HIGH (ref 211–911)

## 2022-12-13 LAB — PSA, MEDICARE: PSA: 2.15 ng/ml (ref 0.10–4.00)

## 2022-12-13 NOTE — Progress Notes (Signed)
No critical labs need to be addressed urgently. We will discuss labs in detail at upcoming office visit.   

## 2022-12-20 ENCOUNTER — Encounter: Payer: Self-pay | Admitting: Family Medicine

## 2022-12-20 ENCOUNTER — Ambulatory Visit (INDEPENDENT_AMBULATORY_CARE_PROVIDER_SITE_OTHER): Payer: Medicare Other | Admitting: Family Medicine

## 2022-12-20 VITALS — BP 130/66 | HR 76 | Temp 98.1°F | Ht 67.25 in | Wt 288.1 lb

## 2022-12-20 DIAGNOSIS — I152 Hypertension secondary to endocrine disorders: Secondary | ICD-10-CM | POA: Diagnosis not present

## 2022-12-20 DIAGNOSIS — E1169 Type 2 diabetes mellitus with other specified complication: Secondary | ICD-10-CM | POA: Diagnosis not present

## 2022-12-20 DIAGNOSIS — E785 Hyperlipidemia, unspecified: Secondary | ICD-10-CM

## 2022-12-20 DIAGNOSIS — E559 Vitamin D deficiency, unspecified: Secondary | ICD-10-CM

## 2022-12-20 DIAGNOSIS — F332 Major depressive disorder, recurrent severe without psychotic features: Secondary | ICD-10-CM | POA: Diagnosis not present

## 2022-12-20 DIAGNOSIS — I5033 Acute on chronic diastolic (congestive) heart failure: Secondary | ICD-10-CM | POA: Diagnosis not present

## 2022-12-20 DIAGNOSIS — I442 Atrioventricular block, complete: Secondary | ICD-10-CM | POA: Diagnosis not present

## 2022-12-20 DIAGNOSIS — E1159 Type 2 diabetes mellitus with other circulatory complications: Secondary | ICD-10-CM

## 2022-12-20 DIAGNOSIS — Z0000 Encounter for general adult medical examination without abnormal findings: Secondary | ICD-10-CM | POA: Diagnosis not present

## 2022-12-20 DIAGNOSIS — E118 Type 2 diabetes mellitus with unspecified complications: Secondary | ICD-10-CM

## 2022-12-20 DIAGNOSIS — R972 Elevated prostate specific antigen [PSA]: Secondary | ICD-10-CM

## 2022-12-20 HISTORY — DX: Vitamin D deficiency, unspecified: E55.9

## 2022-12-20 LAB — HM DIABETES FOOT EXAM

## 2022-12-20 MED ORDER — VITAMIN D3 1.25 MG (50000 UT) PO CAPS: 1.0000 | ORAL_CAPSULE | ORAL | 0 refills | Status: DC

## 2022-12-20 NOTE — Assessment & Plan Note (Signed)
Encouraged exercise, weight loss, healthy eating habits. Status post bariatric surgery.

## 2022-12-20 NOTE — Assessment & Plan Note (Signed)
Chronic, asymptomatic since pacer placed. Followed by cardiology Dr. Ladona Ridgel

## 2022-12-20 NOTE — Assessment & Plan Note (Signed)
Chronic, resolved status post bariatric surgery. 

## 2022-12-20 NOTE — Assessment & Plan Note (Signed)
Followed by cardiology 

## 2022-12-20 NOTE — Assessment & Plan Note (Signed)
Stable, chronic.  Continue current medication.   effexor 75 mg 3 tabs daily.[name] splits dose for absorption.

## 2022-12-20 NOTE — Assessment & Plan Note (Signed)
Stable, chronic.  Continue current medication.   amlodipine 5 mg daily, metoprolol 25 mg daily, valsartan 320 daily 

## 2022-12-20 NOTE — Assessment & Plan Note (Signed)
Replete with Vit D 3 50000 IU weekly.

## 2022-12-20 NOTE — Assessment & Plan Note (Signed)
Stable, chronic.  Continue current medication.   well controlled LDL at goal on simvastatin 20 mg daily  

## 2022-12-20 NOTE — Patient Instructions (Signed)
Keep up working on healthy eating and regular activity.

## 2022-12-20 NOTE — Progress Notes (Signed)
Patient ID: Blake Garcia, male    DOB: [birth year], 67 y.o.   MRN: [id number]  This visit was conducted in person.  BP 130/66 (BP Location: Left Arm, Patient Position: Sitting, Cuff Size: Large)   Pulse 76   Temp 98.1 F (36.7 C) (Temporal)   Ht 5' 7.25" (1.708 m)   Wt 288 lb 2 oz (130.7 kg)   SpO2 96%   BMI 44.79 kg/m    CC:  Chief Complaint  Patient presents with   Medicare Wellness    Subjective:   HPI: Blake Garcia is a 67 y.o. male presenting on 12/20/2022 for welcome to  Medicare Wellness  The patient presents for  medicare wellness,  and review of chronic health problems. He/She also has the following acute concerns today: none  I have personally reviewed the Medicare Annual Wellness questionnaire and have noted 1. The patient's medical and social history 2. Their use of alcohol, tobacco or illicit drugs 3. Their current medications and supplements 4. The patient's functional ability including ADL's, fall risks, home safety risks and hearing or visual             impairment. 5. Diet and physical activities 6. Evidence for depression or mood disorders 7.         Updated provider list Cognitive evaluation was performed and recorded on pt medicare questionnaire form. The patients weight, height, BMI and visual acuity have been recorded in the chart   I have made referrals, counseling and provided education to the patient based review of the above and I have provided the pt with a written personalized care plan for preventive services.   Documentation of this information was scanned into the electronic record under the media tab.   Advance directives and end of life planning reviewed in detail with patient and documented in EMR. Patient given handout on advance care directives if needed. HCPOA and living will updated if needed. No falls in last 12 months.  Reviewed hospital admission in December 27 to 29 and 2023 for complete heart block, hypokalemia and acute  on chronic diastolic heart failure Pacemaker implanted by Dr. Ladona Ridgel Reviewed last office visit note from November 09, 2022 Dr. Ladona Ridgel   He has lost > 200 lbs with bariatric surgery. Wt Readings from Last 3 Encounters:  12/20/22 288 lb 2 oz (130.7 kg)  11/09/22 294 lb 0.2 oz (133.4 kg)  07/27/22 297 lb 9.9 oz (135 kg)  Body mass index is 44.79 kg/m.  He has been able to mow his yard. Walking with cane.  No longer requiring oxygen.  He is going to the gym, walking.  Diabetes:   resolved s/p bariatric surgery, on no medication. Lab Results  Component Value Date   HGBA1C 5.2 12/13/2022  Using medications without difficulties: Hypoglycemic episodes: Hyperglycemic episodes: Feet problems:none Blood Sugars averaging: eye exam within last year:yes   Elevated Cholesterol:  well controlled LDL at goal on simvastatin 20 mg daily Lab Results  Component Value Date   CHOL 118 12/13/2022   HDL 47.30 12/13/2022   LDLCALC 52 12/13/2022   LDLDIRECT 153.9 09/10/2007   TRIG 91.0 12/13/2022   CHOLHDL 2 12/13/2022  Using medications without problems: none Muscle aches:  Diet compliance:yes Exercise:see above Other complaints:  Hypertension:   Well controlled  amlodipine 5 mg daily, metoprolol 25 mg daily, valsartan 320 daily  BP Readings from Last 3 Encounters:  12/20/22 130/66  11/09/22 (!) 148/96  07/30/22 (!) 168/94  Using medication  without problems or lightheadedness:  Chest pain with exertion: Edema: Short of breath: Average home BPs: Other issues:  MDD: well controlled on effexor 75 mg 3 tabs daily.[name] splits dose for absorption. Flowsheet Row Office Visit from [facility]/20/2022 [facility]  PHQ-2 Total Score 2       Vit D low.. taking bariatric med.  Relevant past medical, surgical, family and social history reviewed and updated as indicated. Interim medical history since our last visit reviewed. Allergies and medications reviewed and  updated. Outpatient Medications Prior to Visit  Medication Sig Dispense Refill   amLODipine (NORVASC) 5 MG tablet TAKE 1 TABLET BY MOUTH ONCE DAILY 90 tablet 0   Calcium Carb-Cholecalciferol (CALCIUM 500 + D PO) Take 3 tablets by mouth every evening.     metoprolol tartrate (LOPRESSOR) 25 MG tablet Take 25 mg by mouth 2 (two) times daily.     Multiple Vitamins-Minerals (BARIATRIC MULTIVITAMINS/IRON) CAPS Take 1 capsule by mouth daily.     nystatin (MYCOSTATIN/NYSTOP) powder Apply 1 Application topically 2 (two) times daily as needed (for dryness).     nystatin-triamcinolone (MYCOLOG II) cream Apply 1 Application topically 2 (two) times daily as needed.     potassium chloride SA (KLOR-CON M) 20 MEQ tablet TAKE ONE AND A HALF TABLETS BY MOUTH EVERY DAY 135 tablet 1   simvastatin (ZOCOR) 20 MG tablet TAKE ONE TABLET AT BEDTIME 90 tablet 3   triamcinolone cream (KENALOG) 0.1 % APPLY TO AFFECTED AREAS TWICE A DAY IF NEEDED 100 g 0   valsartan (DIOVAN) 320 MG tablet TAKE 1 TABLET BY MOUTH ONCE DAILY 90 tablet 3   venlafaxine XR (EFFEXOR-XR) 75 MG 24 hr capsule TAKE THREE CAPSULES DAILY 270 capsule 0   nystatin-triamcinolone (MYCOLOG II) cream Apply 1 application topically 2 (two) times daily. (Patient taking differently: Apply 1 application  topically 2 (two) times daily as needed.) 30 g 0   No facility-administered medications prior to visit.     Per HPI unless specifically indicated in ROS section below Review of Systems  Constitutional:  Negative for fatigue and fever.  HENT:  Negative for ear pain.   Eyes:  Negative for pain.  Respiratory:  Negative for cough and shortness of breath.   Cardiovascular:  Negative for chest pain, palpitations and leg swelling.  Gastrointestinal:  Negative for abdominal pain.  Genitourinary:  Negative for dysuria.  Musculoskeletal:  Negative for arthralgias.  Neurological:  Negative for syncope, light-headedness and headaches.  Psychiatric/Behavioral:   Negative for dysphoric mood.    Objective:  BP 130/66 (BP Location: Left Arm, Patient Position: Sitting, Cuff Size: Large)   Pulse 76   Temp 98.1 F (36.7 C) (Temporal)   Ht 5' 7.25" (1.708 m)   Wt 288 lb 2 oz (130.7 kg)   SpO2 96%   BMI 44.79 kg/m   Wt Readings from Last 3 Encounters:  12/20/22 288 lb 2 oz (130.7 kg)  11/09/22 294 lb 0.2 oz (133.4 kg)  07/27/22 297 lb 9.9 oz (135 kg)      Physical Exam Constitutional:      General: He is not in acute distress.    Appearance: Normal appearance. He is well-developed. He is obese. He is not ill-appearing or toxic-appearing.  HENT:     Head: Normocephalic and atraumatic.     Right Ear: Hearing, tympanic membrane, ear canal and external ear normal.     Left Ear: Hearing, tympanic membrane, ear canal and external ear normal.  Nose: Nose normal.     Mouth/Throat:     Pharynx: Uvula midline.  Eyes:     General: Lids are normal. Lids are everted, no foreign bodies appreciated.     Conjunctiva/sclera: Conjunctivae normal.     Pupils: Pupils are equal, round, and reactive to light.  Neck:     Thyroid: No thyroid mass or thyromegaly.     Vascular: No carotid bruit.     Trachea: Trachea and phonation normal.  Cardiovascular:     Rate and Rhythm: Normal rate and regular rhythm.     Pulses: Normal pulses.     Heart sounds: S1 normal and S2 normal. No murmur heard.    No gallop.  Pulmonary:     Breath sounds: Normal breath sounds. No wheezing, rhonchi or rales.  Abdominal:     General: Bowel sounds are normal.     Palpations: Abdomen is soft.     Tenderness: There is no abdominal tenderness. There is no guarding or rebound.     Hernia: No hernia is present.  Musculoskeletal:     Cervical back: Normal range of motion and neck supple.  Lymphadenopathy:     Cervical: No cervical adenopathy.  Skin:    General: Skin is warm and dry.     Findings: No rash.  Neurological:     Mental Status: He is alert.     Cranial Nerves: No  cranial nerve deficit.     Sensory: No sensory deficit.     Gait: Gait normal.     Deep Tendon Reflexes: Reflexes are normal and symmetric.  Psychiatric:        Speech: Speech normal.        Behavior: Behavior normal.        Judgment: Judgment normal.      Diabetic foot exam: Normal inspection No skin breakdown No calluses  Normal DP pulses Normal sensation to light touch and monofilament Nails normal  Results for orders placed or performed in visit on 12/20/22  HM DIABETES FOOT EXAM  Result Value Ref Range   HM Diabetic Foot Exam done      COVID 19 screen:  No recent travel or known exposure to COVID19 The patient denies respiratory symptoms of COVID 19 at this time. The importance of social distancing was discussed today.   Assessment and Plan   The patient's preventative maintenance and recommended screening tests for an annual wellness exam were reviewed in full today. Brought up to date unless services declined.  Counselled on the importance of diet, exercise, and its role in overall health and mortality. The patient's FH and SH was reviewed, including their home life, tobacco status, and drug and alcohol status.   Vaccines:  uptodate shingrix, Td 2027, prevnar 20 given today. Has had bivalent booster Prostate Cancer Screen:  Stable, dad with pro cancer.  Lab Results  Component Value Date   PSA 2.15 12/13/2022   PSA 1.77 12/08/2021   PSA 1.51 12/04/2020  Colon Cancer Screen:  colonoscopy  07/2020 recall in 7 years      Smoking Status:none ETOH/ drug AOZ:HYQM/VHQI  Hep C: done  HIV screen:   done  Problem List Items Addressed This Visit     Acute on chronic diastolic (congestive) heart failure (HCC)    Followed by cardiology      Complete heart block (HCC)    Chronic, asymptomatic since pacer placed. Followed by cardiology Dr. Ladona Ridgel       Controlled type 2 diabetes mellitus  with complication, without long-term current use of insulin (HCC) (Chronic)     Chronic, resolved status post bariatric surgery.      Relevant Orders   Hemoglobin A1c   Lipid panel   Comprehensive metabolic panel   Elevated PSA   Relevant Orders   PSA, total and free   Hyperlipidemia associated with type 2 diabetes mellitus (HCC) (Chronic)    Stable, chronic.  Continue current medication.   well controlled LDL at goal on simvastatin 20 mg daily      Hypertension associated with diabetes (HCC) (Chronic)    Stable, chronic.  Continue current medication.   amlodipine 5 mg daily, metoprolol 25 mg daily, valsartan 320 daily      Major depressive disorder, recurrent episode, severe (HCC) (Chronic)    Stable, chronic.  Continue current medication.   effexor 75 mg 3 tabs daily.[name] splits dose for absorption.      Obesity, Class III, BMI 40-49.9 (morbid obesity) (HCC)     Encouraged exercise, weight loss, healthy eating habits. Status post bariatric surgery.      Vitamin D deficiency    Replete with Vit D 3 50000 IU weekly.      Other Visit Diagnoses     Medicare annual wellness visit, subsequent    -  Primary      Kerby Nora, MD

## 2022-12-29 ENCOUNTER — Other Ambulatory Visit: Payer: Self-pay | Admitting: Family Medicine

## 2022-12-29 DIAGNOSIS — F3342 Major depressive disorder, recurrent, in full remission: Secondary | ICD-10-CM

## 2023-01-27 ENCOUNTER — Ambulatory Visit (INDEPENDENT_AMBULATORY_CARE_PROVIDER_SITE_OTHER): Payer: Medicare Other

## 2023-01-27 DIAGNOSIS — I442 Atrioventricular block, complete: Secondary | ICD-10-CM

## 2023-01-27 LAB — CUP PACEART REMOTE DEVICE CHECK
Battery Remaining Longevity: 61 mo
Battery Remaining Percentage: 95 %
Battery Voltage: 2.98 V
Brady Statistic AP VP Percent: 31 %
Brady Statistic AP VS Percent: 1 %
Brady Statistic AS VP Percent: 65 %
Brady Statistic AS VS Percent: 1.8 %
Brady Statistic RA Percent Paced: 29 %
Brady Statistic RV Percent Paced: 96 %
Date Time Interrogation Session: 20240628020013
Implantable Lead Connection Status: 753985
Implantable Lead Connection Status: 753985
Implantable Lead Implant Date: 20231229
Implantable Lead Implant Date: 20231229
Implantable Lead Location: 753859
Implantable Lead Location: 753860
Implantable Pulse Generator Implant Date: 20231229
Lead Channel Impedance Value: 480 Ohm
Lead Channel Impedance Value: 530 Ohm
Lead Channel Pacing Threshold Amplitude: 0.75 V
Lead Channel Pacing Threshold Amplitude: 1 V
Lead Channel Pacing Threshold Pulse Width: 0.5 ms
Lead Channel Pacing Threshold Pulse Width: 0.5 ms
Lead Channel Sensing Intrinsic Amplitude: 12 mV
Lead Channel Sensing Intrinsic Amplitude: 3.5 mV
Lead Channel Setting Pacing Amplitude: 3.5 V
Lead Channel Setting Pacing Amplitude: 3.5 V
Lead Channel Setting Pacing Pulse Width: 0.5 ms
Lead Channel Setting Sensing Sensitivity: 2 mV
Pulse Gen Model: 2272
Pulse Gen Serial Number: 8139537

## 2023-02-06 ENCOUNTER — Other Ambulatory Visit: Payer: Self-pay | Admitting: Cardiovascular Disease

## 2023-02-06 ENCOUNTER — Other Ambulatory Visit: Payer: Self-pay | Admitting: Cardiology

## 2023-02-06 ENCOUNTER — Other Ambulatory Visit: Payer: Self-pay | Admitting: Family Medicine

## 2023-02-06 NOTE — Telephone Encounter (Signed)
Please schedule F/U appointment for further refills. Thank you! 

## 2023-02-09 NOTE — Progress Notes (Signed)
Remote pacemaker transmission.   

## 2023-03-14 ENCOUNTER — Other Ambulatory Visit: Payer: Self-pay

## 2023-03-14 ENCOUNTER — Other Ambulatory Visit: Payer: Self-pay | Admitting: *Deleted

## 2023-03-14 DIAGNOSIS — F3342 Major depressive disorder, recurrent, in full remission: Secondary | ICD-10-CM

## 2023-03-14 MED ORDER — POTASSIUM CHLORIDE CRYS ER 20 MEQ PO TBCR: EXTENDED_RELEASE_TABLET | ORAL | 3 refills | Status: DC

## 2023-03-14 MED ORDER — SIMVASTATIN 20 MG PO TABS: 20.0000 mg | ORAL_TABLET | Freq: Every day | ORAL | 3 refills | Status: DC

## 2023-03-14 MED ORDER — VENLAFAXINE HCL ER 75 MG PO CP24
ORAL_CAPSULE | ORAL | 3 refills | Status: DC
Start: 2023-03-14 — End: 2024-04-09

## 2023-03-14 MED ORDER — AMLODIPINE BESYLATE 5 MG PO TABS: 5.0000 mg | ORAL_TABLET | Freq: Every day | ORAL | 0 refills | Status: DC

## 2023-03-16 ENCOUNTER — Other Ambulatory Visit: Payer: Self-pay

## 2023-03-16 MED ORDER — METOPROLOL TARTRATE 25 MG PO TABS: 25.0000 mg | ORAL_TABLET | Freq: Two times a day (BID) | ORAL | 0 refills | Status: DC

## 2023-03-16 MED ORDER — VALSARTAN 320 MG PO TABS: 320.0000 mg | ORAL_TABLET | Freq: Every day | ORAL | 0 refills | Status: DC

## 2023-03-16 NOTE — Telephone Encounter (Signed)
Requested Prescriptions   Signed Prescriptions Disp Refills   metoprolol tartrate (LOPRESSOR) 25 MG tablet 180 tablet 0    Sig: Take 1 tablet (25 mg total) by mouth 2 (two) times daily.    Authorizing Provider: Lorine Bears A    Ordering User: Feliberto Harts L   valsartan (DIOVAN) 320 MG tablet 90 tablet 0    Sig: Take 1 tablet (320 mg total) by mouth daily.    Authorizing Provider: Lorine Bears A    Ordering User: Guerry Minors

## 2023-03-28 ENCOUNTER — Encounter: Payer: Self-pay | Admitting: Cardiology

## 2023-03-28 ENCOUNTER — Telehealth: Payer: Self-pay | Admitting: Cardiovascular Disease

## 2023-03-28 ENCOUNTER — Ambulatory Visit: Payer: Medicare Other | Attending: Cardiology | Admitting: Cardiology

## 2023-03-28 VITALS — BP 160/90 | HR 66 | Ht 67.25 in | Wt 290.6 lb

## 2023-03-28 DIAGNOSIS — I442 Atrioventricular block, complete: Secondary | ICD-10-CM

## 2023-03-28 DIAGNOSIS — E1169 Type 2 diabetes mellitus with other specified complication: Secondary | ICD-10-CM

## 2023-03-28 DIAGNOSIS — E785 Hyperlipidemia, unspecified: Secondary | ICD-10-CM

## 2023-03-28 DIAGNOSIS — E1159 Type 2 diabetes mellitus with other circulatory complications: Secondary | ICD-10-CM | POA: Diagnosis not present

## 2023-03-28 DIAGNOSIS — I5033 Acute on chronic diastolic (congestive) heart failure: Secondary | ICD-10-CM

## 2023-03-28 DIAGNOSIS — I10 Essential (primary) hypertension: Secondary | ICD-10-CM | POA: Diagnosis not present

## 2023-03-28 DIAGNOSIS — I152 Hypertension secondary to endocrine disorders: Secondary | ICD-10-CM | POA: Diagnosis not present

## 2023-03-28 MED ORDER — POTASSIUM CHLORIDE CRYS ER 20 MEQ PO TBCR: 20.0000 meq | EXTENDED_RELEASE_TABLET | Freq: Every day | ORAL | 3 refills | Status: DC

## 2023-03-28 MED ORDER — SPIRONOLACTONE 25 MG PO TABS: 25.0000 mg | ORAL_TABLET | Freq: Every day | ORAL | 3 refills | Status: DC

## 2023-03-28 MED ORDER — CARVEDILOL 6.25 MG PO TABS: 6.2500 mg | ORAL_TABLET | Freq: Two times a day (BID) | ORAL | 3 refills | Status: DC

## 2023-03-28 MED ORDER — VALSARTAN 320 MG PO TABS: 320.0000 mg | ORAL_TABLET | Freq: Every day | ORAL | 3 refills | Status: DC

## 2023-03-28 MED ORDER — POTASSIUM CHLORIDE CRYS ER 20 MEQ PO TBCR: 20.0000 meq | EXTENDED_RELEASE_TABLET | Freq: Every day | ORAL | Status: DC

## 2023-03-28 NOTE — Telephone Encounter (Signed)
Pt c/o medication issue:  1. Name of Medication: potassium chloride SA (KLOR-CON M) 20 MEQ tablet   2. How are you currently taking this medication (dosage and times per day)? Take 1 tablet (20 mEq total) by mouth daily. TAKE ONE AND ONE-HALF TABLETS DAILY   3. Are you having a reaction (difficulty breathing--STAT)? No  4. What is your medication issue? Amy from [facility] would like clarification on the instructions for this medication.

## 2023-03-28 NOTE — Progress Notes (Signed)
Cardiology Office Note:   Date:  03/28/2023  ID:  Blake Garcia, DOB 1956-06-11, MRN [id number] PCP: Excell Seltzer, MD  [facility] Providers Cardiologist:  Lorine Bears, MD    History of Present Illness:   Blake Garcia is a 67 y.o. male with history of hypertension, hyperlipidemia, obstructive sleep apnea on CPAP, type 2 diabetes, chronic diastolic heart failure. Patient presents today for follow up of elevated BP with headache.   Patient initially established with cardiology in 2016 for evaluation of midsternal chest discomfort.  Body habitus made ischemic evaluation challenging, but patient had resolution of his discomfort.  An echocardiogram in May 2016 showed normal LV function.  Patient continued to do well until 2021 when he reported return of previous we felt to be surgical chest pain.  Pain felt both at rest and with exertion and associated with shortness of breath.  At that time he was also noted with frequent PVCs on EKG, and a 2-week ZIO monitor was ordered.  This 2021 monitor showed normal sinus rhythm with 3 episodes of wide-complex tachycardia, up to 15 beats.  Echocardiogram showed LVEF greater than 55% with severe left ventricular hypertrophy.  Seen by Dr. Graciela Husbands in 2021 for evaluation of his ectopy, and was started on beta-blocker.  Patient had good response to metoprolol with home heart rates in the 80s.  In 2022, patient was able to undergo gastric sleeve surgery and had rapid loss of weight with improvement in symptoms.   In December 2023, patient called cardio allergy office triage line to report increased shortness of breath over 3 to 5-day.  He had also noted chest pain and swelling.  An EKG at that time noted secondary AV block with Mobitz type I and Mobitz type II with transient complete heart block he was taken off of metoprolol for washout for monitoring, and evaluation of pacemaker.  At the same, patient also with evidence of acute on chronic diastolic heart  failure.  He received IV diuresis with improvement in shortness of breath.  TTE at that time showed LVEF 60 to 65% with no regional wall motion abnormality.  Mildly elevated pulmonary artery systolic pressure.  Patient continued to be in high-grade AV block despite holding beta-blocker and as a result was transferred to [facility] for pacemaker placement.  On 07/29/2022, patient underwent successful implantation of [facility] Jude dual-chamber pacemaker.   Patient last seen in follow-up in April by Dr. Ladona Ridgel in EP clinic.  At the time patient reported feeling well with improvement in lower extremity edema.  He denied chest pain, shortness of breath.  Patient presents today for acute evaluation of elevated blood pressure.  He reports that about 2 weeks ago he began to have headaches, prompting him to check his blood pressure.  He found that his blood pressure was persistently in the 170/90 range.  A review of historical office visits and patient's blood pressure log, shows that prior to the last few weeks his blood pressure was more consistently in the 130s to 140s over 80s.  Aside from headaches, patient denies symptoms. He has not had chest pain, shortness of breath, lower extremity edema, fatigue, palpitations, melena, hematuria, hemoptysis, diaphoresis, weakness, presyncope, syncope, orthopnea, and PND.   He has been compliant with all of his medications without side effects.  Continues to be very pleased with how he feels following his pacemaker placement in December 2023.  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  Enjoys this  immensely and is pleased with how much his general health has improved with weight loss following bariatric surgery a few years ago.  Studies Reviewed:    EKG:          Risk Assessment/Calculations:     HYPERTENSION CONTROL Vitals:   03/28/23 1409 03/28/23 1441  BP: (!) 148/76 (!) 160/90    The patient's blood pressure is elevated above target  today.  In order to address the patient's elevated BP: A current anti-hypertensive medication was adjusted today.; A new medication was prescribed today.; Follow up with general cardiology has been recommended.           Physical Exam:   VS:  BP (!) 160/90 (BP Location: Right Arm, Patient Position: Sitting)   Pulse 66   Ht 5' 7.25" (1.708 m)   Wt 290 lb 9.6 oz (131.8 kg)   SpO2 96%   BMI 45.18 kg/m    Wt Readings from Last 3 Encounters:  03/28/23 290 lb 9.6 oz (131.8 kg)  12/20/22 288 lb 2 oz (130.7 kg)  11/09/22 294 lb 0.2 oz (133.4 kg)     Physical Exam Vitals reviewed.  Constitutional:      Appearance: Normal appearance.  HENT:     Head: Normocephalic.  Eyes:     Pupils: Pupils are equal, round, and reactive to light.  Cardiovascular:     Rate and Rhythm: Normal rate and regular rhythm.     Pulses: Normal pulses.     Heart sounds: Normal heart sounds.  Pulmonary:     Effort: Pulmonary effort is normal.     Breath sounds: Normal breath sounds.  Abdominal:     General: Abdomen is flat.     Palpations: Abdomen is soft.  Musculoskeletal:     Right lower leg: No edema.     Left lower leg: No edema.  Skin:    General: Skin is warm and dry.     Capillary Refill: Capillary refill takes less than 2 seconds.  Neurological:     General: No focal deficit present.     Mental Status: He is alert and oriented to person, place, and time.  Psychiatric:        Mood and Affect: Mood normal.        Behavior: Behavior normal.        Thought Content: Thought content normal.        Judgment: Judgment normal.      ASSESSMENT AND PLAN:   Complete heart block (HCC) Patient's status post implantation of [employer] Jude dual-chamber pacemaker in December 2023 with Dr. Ladona Ridgel.  Most recent device interrogation reviewed, stable.  Patient with regular rate/rhythm on physical exam today.  Hypertension associated with diabetes (HCC) Patient's primary reason for visit today is acute  elevation of blood pressure.  He began to have headaches a couple of weeks ago and noted increase in blood pressure, 170s over 90s.  Prior to the last few weeks, blood pressure was more consistently in the 130s to 140s over 80s.  Patient has been taking all of his medications and denies high salt diet.  He does have a new grandchild, and admits that there is some additional family/domestic stress related to one of his other son's marriage.  Blood pressure in clinic today as high as 160/90.  Outside labs reviewed today, patient's creatinine 0.820 as of 12/13/2022.  Potassium 3.6 on the same day.  Switch from metoprolol to tartrate 25 mg twice daily to carvedilol 6.25 mg twice  daily. Start spironolactone 25 mg.  Patient with history of persistently low potassium.  Will decrease his potassium supplementation to 20 mEq daily and check a basic metabolic panel this week. Continue amlodipine 5 mg Continue valsartan 320 mg Keep follow-up with Charlsie Quest, NP in September to assess response to above changes.   Acute on chronic diastolic (congestive) heart failure (HCC) Last TTE 07/28/22 showed left ventricular ejection fraction, by estimation, is 55 to 60%. Left ventricle with normal function. The left ventricle had no regional wall motion abnormalities. The left ventricular internal cavity size was mildly dilated. There was moderate left ventricular hypertrophy. Left ventricular diastolic parameters consistent with Grade II diastolic dysfunction.   Patient euvolemic on physical exam today.  No exertional dyspnea/intolerance Continue valsartan 320 mg, amlodipine 5 mg Switch from metoprolol to carvedilol as above. Start spironolactone 25 mg as above (with adjusted potassium supplementation)  Hyperlipidemia associated with type 2 diabetes mellitus (HCC) Patient is LDL excellent at 52 per 12/13/2022 labs.  Continue simvastatin 20 mg daily.           Signed, Perlie Gold, PA-C

## 2023-03-28 NOTE — Patient Instructions (Signed)
Medication Instructions:  Your physician has recommended you make the following change in your medication:  STOP: metoprolol START: carvedilol (Coreg) 6.25 mg by mouth twice daily  START: spironolactone (Aldactone)  25 mg by mouth once daily DECREASE: Potassium Chloride to 20 mEq once daily  *If you need a refill on your cardiac medications before your next appointment, please call your pharmacy*   Lab Work: IN 1-2 WEEKS: BMP  If you have labs (blood work) drawn today and your tests are completely normal, you will receive your results only by: MyChart Message (if you have MyChart) OR A paper copy in the mail If you have any lab test that is abnormal or we need to change your treatment, we will call you to review the results.   Testing/Procedures: NONE   Follow-Up:As scheduled At [facility], you and your health needs are our priority.  As part of our continuing mission to provide you with exceptional heart care, we have created designated Provider Care Teams.  These Care Teams include your primary Cardiologist (physician) and Advanced Practice Providers (APPs -  Physician Assistants and Nurse Practitioners) who all work together to provide you with the care you need, when you need it.

## 2023-03-28 NOTE — Assessment & Plan Note (Signed)
Patient's status post implantation of [employer] Jude dual-chamber pacemaker in December 2023 with Dr. Ladona Ridgel.  Most recent device interrogation reviewed, stable.  Patient with regular rate/rhythm on physical exam today.

## 2023-03-28 NOTE — Assessment & Plan Note (Signed)
Patient is LDL excellent at 52 per 12/13/2022 labs.  Continue simvastatin 20 mg daily.

## 2023-03-28 NOTE — Assessment & Plan Note (Signed)
Last TTE 07/28/22 showed left ventricular ejection fraction, by estimation, is 55 to 60%. Left ventricle with normal function. The left ventricle had no regional wall motion abnormalities. The left ventricular internal cavity size was mildly dilated. There was moderate left ventricular hypertrophy. Left ventricular diastolic parameters consistent with Grade II diastolic dysfunction.   Patient euvolemic on physical exam today.  No exertional dyspnea/intolerance Continue valsartan 320 mg, amlodipine 5 mg Switch from metoprolol to carvedilol as above. Start spironolactone 25 mg as above (with adjusted potassium supplementation)

## 2023-03-28 NOTE — Telephone Encounter (Signed)
Called [facility] pharmacy and clarified med order for pt from Dr Perlie Gold. Corrected "sig" written order in pt's chart.

## 2023-03-28 NOTE — Assessment & Plan Note (Addendum)
Patient's primary reason for visit today is acute elevation of blood pressure.  He began to have headaches a couple of weeks ago and noted increase in blood pressure, 170s over 90s.  Prior to the last few weeks, blood pressure was more consistently in the 130s to 140s over 80s.  Patient has been taking all of his medications and denies high salt diet.  He does have a new grandchild, and admits that there is some additional family/domestic stress related to one of his other son's marriage.  Blood pressure in clinic today as high as 160/90.  Outside labs reviewed today, patient's creatinine 0.820 as of 12/13/2022.  Potassium 3.6 on the same day.  Switch from metoprolol to tartrate 25 mg twice daily to carvedilol 6.25 mg twice daily. Start spironolactone 25 mg.  Patient with history of persistently low potassium.  Will decrease his potassium supplementation to 20 mEq daily and check a basic metabolic panel this week. Continue amlodipine 5 mg Continue valsartan 320 mg Keep follow-up with Charlsie Quest, NP in September to assess response to above changes.

## 2023-03-30 DIAGNOSIS — I10 Essential (primary) hypertension: Secondary | ICD-10-CM | POA: Diagnosis not present

## 2023-03-30 LAB — BASIC METABOLIC PANEL
BUN/Creatinine Ratio: 13 (ref 10–24)
BUN: 11 mg/dL (ref 8–27)
CO2: 27 mmol/L (ref 20–29)
Calcium: 8.8 mg/dL (ref 8.6–10.2)
Chloride: 104 mmol/L (ref 96–106)
Creatinine, Ser: 0.82 mg/dL (ref 0.76–1.27)
Glucose: 100 mg/dL — ABNORMAL HIGH (ref 70–99)
Potassium: 3.7 mmol/L (ref 3.5–5.2)
Sodium: 143 mmol/L (ref 134–144)
eGFR: 97 mL/min/{1.73_m2} (ref >59)

## 2023-04-05 MED ORDER — AMLODIPINE BESYLATE 5 MG PO TABS: 10.0000 mg | ORAL_TABLET | Freq: Every day | ORAL | Status: DC

## 2023-04-05 MED ORDER — CARVEDILOL 6.25 MG PO TABS: 12.5000 mg | ORAL_TABLET | Freq: Two times a day (BID) | ORAL | Status: DC

## 2023-04-05 NOTE — Telephone Encounter (Signed)
Perlie Gold, PA-C     BP is better but still elevated. I recommend increasing Amlodipine to 10mg  and increasing Coreg to 12.5mg  BID. If patient prefers slower adjustment, would start with Amlodipine 10mg . He has upcoming follow up on 9/16 and could have further adjustment of medications at that time.  Perlie Gold, PA-C

## 2023-04-17 ENCOUNTER — Ambulatory Visit: Payer: Medicare Other | Admitting: Cardiology

## 2023-04-21 ENCOUNTER — Encounter: Payer: Self-pay | Admitting: Cardiology

## 2023-04-21 ENCOUNTER — Ambulatory Visit: Payer: Medicare Other | Attending: Cardiology | Admitting: Cardiology

## 2023-04-21 VITALS — BP 144/82 | HR 65 | Ht 67.0 in | Wt 289.2 lb

## 2023-04-21 DIAGNOSIS — I442 Atrioventricular block, complete: Secondary | ICD-10-CM | POA: Diagnosis not present

## 2023-04-21 DIAGNOSIS — E785 Hyperlipidemia, unspecified: Secondary | ICD-10-CM | POA: Diagnosis not present

## 2023-04-21 DIAGNOSIS — E66813 Obesity, class 3: Secondary | ICD-10-CM

## 2023-04-21 DIAGNOSIS — E1159 Type 2 diabetes mellitus with other circulatory complications: Secondary | ICD-10-CM

## 2023-04-21 DIAGNOSIS — E1169 Type 2 diabetes mellitus with other specified complication: Secondary | ICD-10-CM | POA: Diagnosis not present

## 2023-04-21 DIAGNOSIS — I5032 Chronic diastolic (congestive) heart failure: Secondary | ICD-10-CM | POA: Diagnosis not present

## 2023-04-21 DIAGNOSIS — I152 Hypertension secondary to endocrine disorders: Secondary | ICD-10-CM | POA: Diagnosis not present

## 2023-04-21 DIAGNOSIS — Z950 Presence of cardiac pacemaker: Secondary | ICD-10-CM

## 2023-04-21 MED ORDER — CARVEDILOL 12.5 MG PO TABS: 12.5000 mg | ORAL_TABLET | Freq: Two times a day (BID) | ORAL | Status: DC

## 2023-04-21 NOTE — Patient Instructions (Signed)
Medication Instructions:  Your physician has recommended you make the following change in your medication:   Increase - carvedilol (COREG) 12.5 MG tablet - Take 1 tablet (12.5 mg total) by mouth 2 (two) times daily with a meal.  *If you need a refill on your cardiac medications before your next appointment, please call your pharmacy*  Lab Work: -None ordered  Testing/Procedures: -None ordered  Follow-Up: At [facility], you and your health needs are our priority.  As part of our continuing mission to provide you with exceptional heart care, we have created designated Provider Care Teams.  These Care Teams include your primary Cardiologist (physician) and Advanced Practice Providers (APPs -  Physician Assistants and Nurse Practitioners) who all work together to provide you with the care you need, when you need it.  Your next appointment:   4 week(s)  Provider:   Charlsie Quest, NP    Other Instructions -None

## 2023-04-21 NOTE — Progress Notes (Signed)
Cardiology Office Note:  .   Date:  04/21/2023  ID:  Blake Garcia, DOB 05/04/1956, MRN [id number] PCP: Excell Seltzer, MD  [facility] Providers Cardiologist:  Lorine Bears, MD    History of Present Illness: Blake Garcia   Blake Garcia is a 67 y.o. male with a past medical history of hypertension, hyperlipidemia, obstructive sleep apnea on CPAP, type 2 diabetes, chronic diastolic heart failure, who presents today for follow-up after recent complaints of elevated blood pressure.  Has been established with cardiology since 2016 for evaluation of midsternal chest discomfort.  Body habitus makes ischemic evaluation challenging patient relates occlusion of his discomfort.  Echocardiogram 11/2014 showed normal LV function.  Patient continued to feel 09/21/2019 when he reported return of previous chest discomfort.  Painful both at rest and exertion associated with shortness of breath.  At that time he also had frequent PVCs on EKG 2-week ZIO monitor was ordered.  2021 monitor showed normal sinus rhythm with 3 episodes of wide-complex tachycardia, up to 15 beats.  Echocardiogram showed an LVEF greater than 55% with severe left ventricular hypertrophy.  Seen by EP Dr. Graciela Husbands in 2021 for evaluation of his ectopy and was started on beta-blocker therapy.  Patient had response to metoprolol with normal heart rates in the 80s.  In 2022 he was able to undergo gastric sleeve surgery and had rapid weight loss with improvement in symptoms.  In 07/20/2022 he had been having chest pain and swelling over 3 to 5 days.  EKG at that time noted secondary AV block Mobitz type I Mobitz type II with transient complete heart block was taken off metoprolol for washout of monitoring and evaluation for pacemaker.  At the same time he was sinus with evidence of acute on chronic diastolic heart failure received IV diuresis with improvement in shortness of breath.  Echocardiogram revealed an LVEF of 60 to 65%, no RWMA, mildly elevated  pulmonary artery systolic pressure.  He continued to be in high-grade AV block despite holding beta-blockers and was a result was transferred to [facility] for pacemaker placement.  On 07/29/2022 he underwent successful implantation of [facility] Jude dual-chamber pacemaker.  He was last seen in clinic 03/28/2023 with elevated blood pressure for approximately 2 weeks as well as headache.  Blood pressures persistently had been 170/90.  His metoprolol to tartrate was switched to carvedilol and he was started on spironolactone.  He returns to clinic today stating that he has been doing very well.  No complications since having PPM implantation.  Denies any chest pain, shortness of breath, palpitations, peripheral edema.  Headaches have improved with continued management of blood pressure.  Recent medication changes have been made and he continues to monitor his blood pressures regularly at home.  Upon medication review today it was noted that even though he had increased his carvedilol dosing he was not taking it twice daily was only taken 2 tablets once daily.  He has back at work not having any further issues of shortness of breath and elevated heart rates being noted.  Denies any hospitalizations or visits to the emergency department and continues to work on his weight loss.  ROS: 10 point review of systems has been reviewed and considered negative with exception was been listed in the HPI  Studies Reviewed: Blake Garcia   EKG Interpretation Date/Time:  Friday April 21 2023 10:53:05 EDT Ventricular Rate:  65 PR Interval:  268 QRS Duration:  112 QT Interval:  422 QTC Calculation: 438  R Axis:   -16  Text Interpretation: Atrial-sensed ventricular-paced rhythm with prolonged AV conduction When compared with ECG of 30-Jul-2022 05:26, Vent. rate has decreased BY  12 BPM Confirmed by Charlsie Quest ([id number]) on 04/21/2023 11:10:54 AM    TTE 07/29/22  1. Left ventricular ejection fraction, by estimation, is 60  to 65%. The  left ventricle has normal function. The left ventricle has no regional  wall motion abnormalities. Left ventricular diastolic function could not  be evaluated.   2. Right ventricular systolic function is normal. The right ventricular  size is normal. There is mildly elevated pulmonary artery systolic  pressure.   3. Small pericardial effusion unchanged from prior to device  implantation.   4. The mitral valve is normal in structure. No evidence of mitral valve  regurgitation. No evidence of mitral stenosis.   5. The aortic valve is tricuspid. Aortic valve regurgitation is not  visualized. No aortic stenosis is present.   6. The inferior vena cava is normal in size with greater than 50%  respiratory variability, suggesting right atrial pressure of 3 mmHg.   Limited TTE 07/29/22 1. Image quality poor. Unable to fully assess systolic function.   2. The left ventricle has no regional wall motion abnormalities.   3. Right ventricular systolic function is normal. The right ventricular  size is normal.   4. A small pericardial effusion is present. The pericardial effusion is  anterior to the right ventricle.   5. The mitral valve is normal in structure. No evidence of mitral valve  regurgitation. No evidence of mitral stenosis.   6. The aortic valve is normal in structure. Aortic valve regurgitation is  not visualized. No aortic stenosis is present.   7. The inferior vena cava is normal in size with greater than 50%  respiratory variability, suggesting right atrial pressure of 3 mmHg.   TTE 07/28/22 1. Left ventricular ejection fraction, by estimation, is 55 to 60%. The  left ventricle has normal function. The left ventricle has no regional  wall motion abnormalities. The left ventricular internal cavity size was  mildly dilated. There is moderate  left ventricular hypertrophy. Left ventricular diastolic parameters are  consistent with Grade II diastolic dysfunction  (pseudonormalization).   2. Right ventricular systolic function is normal. The right ventricular  size is normal. There is normal pulmonary artery systolic pressure.   3. Left atrial size was moderately dilated.   4. Right atrial size was mildly dilated.   5. The mitral valve is normal in structure. Mild to moderate mitral valve  regurgitation. No evidence of mitral stenosis.   6. The aortic valve is normal in structure. Aortic valve regurgitation is  not visualized. Aortic valve sclerosis is present, with no evidence of  aortic valve stenosis.   7. The inferior vena cava is dilated in size with >50% respiratory  variability, suggesting right atrial pressure of 8 mmHg.   Risk Assessment/Calculations:     HYPERTENSION CONTROL Vitals:   04/21/23 1048 04/21/23 1105  BP: (!) 150/80 (!) 144/82    The patient's blood pressure is elevated above target today.  In order to address the patient's elevated BP: A current anti-hypertensive medication was adjusted today.          Physical Exam:   VS:  BP (!) 144/82 (BP Location: Left Arm, Patient Position: Sitting, Cuff Size: Large)   Pulse 65   Ht 5\' 7"  (1.702 m)   Wt 289 lb 3.2 oz (131.2 kg)   SpO2  99%   BMI 45.30 kg/m    Wt Readings from Last 3 Encounters:  04/21/23 289 lb 3.2 oz (131.2 kg)  03/28/23 290 lb 9.6 oz (131.8 kg)  12/20/22 288 lb 2 oz (130.7 kg)    GEN: Well nourished, well developed in no acute distress NECK: No JVD; No carotid bruits CARDIAC: RRR, no murmurs, rubs, gallops RESPIRATORY:  Clear to auscultation without rales, wheezing or rhonchi  ABDOMEN: Soft, non-tender, non-distended EXTREMITIES:  No edema; No deformity   ASSESSMENT AND PLAN: .   Primary hypertension associated with diabetes with a blood pressure today of 150/80 and repeat 120/82.  He was previously evaluated in clinic on 03/28/2023 and had several medication changes that were made.  He returns today and with increase in his carvedilol he has not been  taking the p.m. dose.  So he has been encouraged to continue on his amlodipine 10 mg daily, carvedilol 12.5 mg twice daily, Aldactone 25 mg daily, and valsartan 320 mg daily.  He has also been encouraged to continue to monitor his blood pressures 1 to 2 hours postmedication administration.  With the addition of a second dose of carvedilol his blood pressure will likely be at goal his repeat blood pressure was much improved.  Blood pressure log at home has been in the 160s over 80s.  Chronic HFpEF with his last TTE via 07/21/2019 treatment 11 LVEF 55 to 60%, no RWMA, G2 DD.  He denies any shortness of breath today.  Has continue to work on his weight loss since gastric sleeve surgery.  Manage euvolemic on exam with NYHA I symptoms.  He is continued on carvedilol, valsartan, and Aldactone.  Hyperlipidemia associated with type 2 diabetes with most recent LDL 52 12/13/2022.  He is continued on simvastatin 20 mg at bedtime.  His diabetes continues to be maintained by his PCP.  Complete heart block status post implantation of [employer] Jude dual-chamber pacemaker in 07/2022 by Dr. Ladona Ridgel.  Most recent device interrogation reviewed and stable.  Patient continues with remote uploads and clinic visits as scheduled.  EKG today reveals a sensed V paced rhythm with a rate of 65. Recommended to continue with his follow-ups with EP.  Morbid obesity with a BMI of 45.30 status post gastric sleeve surgery.  He has been congratulated today on his continued weight loss as he was down to 289.2 pounds today.  He is encouraged to continue with his activity and maintaining caloric deficit.       Dispo: Patient to return to clinic to see MD/APP in 4 weeks to reevaluate changes to medication regimen today for better blood pressure control  Signed, Paolina Karwowski, NP

## 2023-04-28 ENCOUNTER — Encounter: Payer: Self-pay | Admitting: Family Medicine

## 2023-04-28 ENCOUNTER — Ambulatory Visit: Payer: Medicare Other

## 2023-04-28 DIAGNOSIS — I442 Atrioventricular block, complete: Secondary | ICD-10-CM

## 2023-04-29 LAB — CUP PACEART REMOTE DEVICE CHECK
Battery Remaining Longevity: 58 mo
Battery Remaining Percentage: 90 %
Battery Voltage: 2.98 V
Brady Statistic AP VP Percent: 33 %
Brady Statistic AP VS Percent: 1 %
Brady Statistic AS VP Percent: 64 %
Brady Statistic AS VS Percent: 1.6 %
Brady Statistic RA Percent Paced: 30 %
Brady Statistic RV Percent Paced: 96 %
Date Time Interrogation Session: 20240927020013
Implantable Lead Connection Status: 753985
Implantable Lead Connection Status: 753985
Implantable Lead Implant Date: 20231229
Implantable Lead Implant Date: 20231229
Implantable Lead Location: 753859
Implantable Lead Location: 753860
Implantable Pulse Generator Implant Date: 20231229
Lead Channel Impedance Value: 490 Ohm
Lead Channel Impedance Value: 550 Ohm
Lead Channel Pacing Threshold Amplitude: 0.75 V
Lead Channel Pacing Threshold Amplitude: 1 V
Lead Channel Pacing Threshold Pulse Width: 0.5 ms
Lead Channel Pacing Threshold Pulse Width: 0.5 ms
Lead Channel Sensing Intrinsic Amplitude: 12 mV
Lead Channel Sensing Intrinsic Amplitude: 3.4 mV
Lead Channel Setting Pacing Amplitude: 3.5 V
Lead Channel Setting Pacing Amplitude: 3.5 V
Lead Channel Setting Pacing Pulse Width: 0.5 ms
Lead Channel Setting Sensing Sensitivity: 2 mV
Pulse Gen Model: 2272
Pulse Gen Serial Number: 8139537

## 2023-05-04 NOTE — Progress Notes (Signed)
Remote pacemaker transmission.   

## 2023-05-12 MED ORDER — CARVEDILOL 12.5 MG PO TABS: 12.5000 mg | ORAL_TABLET | Freq: Two times a day (BID) | ORAL | 0 refills | Status: DC

## 2023-05-15 DIAGNOSIS — H52223 Regular astigmatism, bilateral: Secondary | ICD-10-CM | POA: Diagnosis not present

## 2023-05-15 DIAGNOSIS — H524 Presbyopia: Secondary | ICD-10-CM | POA: Diagnosis not present

## 2023-05-15 DIAGNOSIS — E119 Type 2 diabetes mellitus without complications: Secondary | ICD-10-CM | POA: Diagnosis not present

## 2023-05-15 DIAGNOSIS — Z7984 Long term (current) use of oral hypoglycemic drugs: Secondary | ICD-10-CM | POA: Diagnosis not present

## 2023-05-15 DIAGNOSIS — H2513 Age-related nuclear cataract, bilateral: Secondary | ICD-10-CM | POA: Diagnosis not present

## 2023-05-15 DIAGNOSIS — H5213 Myopia, bilateral: Secondary | ICD-10-CM | POA: Diagnosis not present

## 2023-05-15 DIAGNOSIS — H35033 Hypertensive retinopathy, bilateral: Secondary | ICD-10-CM | POA: Diagnosis not present

## 2023-05-15 LAB — HM DIABETES EYE EXAM

## 2023-05-17 ENCOUNTER — Encounter: Payer: Self-pay | Admitting: Family Medicine

## 2023-05-23 ENCOUNTER — Encounter: Payer: Self-pay | Admitting: Cardiology

## 2023-05-23 ENCOUNTER — Ambulatory Visit: Payer: Medicare Other | Attending: Cardiology | Admitting: Cardiology

## 2023-05-23 VITALS — BP 142/80 | HR 70 | Ht 67.0 in | Wt 291.6 lb

## 2023-05-23 DIAGNOSIS — E66813 Obesity, class 3: Secondary | ICD-10-CM

## 2023-05-23 DIAGNOSIS — I5032 Chronic diastolic (congestive) heart failure: Secondary | ICD-10-CM | POA: Diagnosis not present

## 2023-05-23 DIAGNOSIS — E785 Hyperlipidemia, unspecified: Secondary | ICD-10-CM

## 2023-05-23 DIAGNOSIS — Z950 Presence of cardiac pacemaker: Secondary | ICD-10-CM

## 2023-05-23 DIAGNOSIS — I442 Atrioventricular block, complete: Secondary | ICD-10-CM

## 2023-05-23 DIAGNOSIS — I10 Essential (primary) hypertension: Secondary | ICD-10-CM | POA: Diagnosis not present

## 2023-05-23 DIAGNOSIS — E1169 Type 2 diabetes mellitus with other specified complication: Secondary | ICD-10-CM

## 2023-05-23 DIAGNOSIS — G4733 Obstructive sleep apnea (adult) (pediatric): Secondary | ICD-10-CM | POA: Diagnosis not present

## 2023-05-23 MED ORDER — AMLODIPINE BESYLATE 10 MG PO TABS: 10.0000 mg | ORAL_TABLET | Freq: Every day | ORAL | 3 refills | Status: DC

## 2023-05-23 MED ORDER — VALSARTAN 320 MG PO TABS: 320.0000 mg | ORAL_TABLET | Freq: Every day | ORAL | 3 refills | Status: DC

## 2023-05-23 MED ORDER — CARVEDILOL 12.5 MG PO TABS: 12.5000 mg | ORAL_TABLET | Freq: Two times a day (BID) | ORAL | 3 refills | Status: DC

## 2023-05-23 MED ORDER — SPIRONOLACTONE 25 MG PO TABS: 25.0000 mg | ORAL_TABLET | Freq: Every day | ORAL | 3 refills | Status: DC

## 2023-05-23 NOTE — Progress Notes (Signed)
Cardiology Office Note:  .   Date:  05/23/2023  ID:  Blake Garcia, DOB [birth year], MRN [id number] PCP: Excell Seltzer, MD  [facility] Providers Cardiologist:  Lorine Bears, MD    History of Present Illness: Blake Garcia   Blake Garcia is a 67 y.o. male with a past medical history of hypertension, hyperlipidemia, struct of sleep apnea on CPAP, type 2 diabetes, chronic diastolic heart failure, and recent implantation of PPM for sick sinus syndrome, who is here today for follow-up on recent medication changes made for his blood pressure.  Initially established with cardiology in 2016 for evaluation of midsternal chest discomfort.  Body habitus was making ischemic evaluation challenging.  Echocardiogram in 11/2014 showed normal LV function.  He continued to feel chest discomfort on his return appointment in 09/2019.  He stated it was painful with rest and exertion and had associated shortness of breath.  At that time he also had frequent PVCs on EKG and a 2-week ZIO monitor was ordered.  Monitor revealed normal sinus rhythm with 3 episodes of wide-complex tachycardia up to 15 beats.  Echocardiogram revealed an LVEF of greater than 55% with severe left ventricular hypertrophy.  He was evaluated by EP Dr. Graciela Husbands in 2021 for evaluation of his ectopy and was started on beta-blocker therapy.  He responded to metoprolol with normal heart rates in the 80s.  In 2022 he was able to undergo gastric sleeve surgery and had rapid weight loss with improvement in symptoms.  In 07/20/2022 been having chest pain and swelling over 3 to 5 days.  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  At that time he was sinus with evidence of acute on chronic diastolic heart failure and received IV diuresis with improvement in shortness of breath.  Echocardiogram revealed an LVEF of 60-65%, no RWMA,  mildly elevated pulmonary artery systolic pressure.  He continued to be in high-grade AV block despite holding beta-blockers and as a result was transferred to [facility] for pacemaker implantation.  On 07/29/2022 had successful implantation of a [facility] Jude dual-chamber pacemaker.  He was last seen in clinic 04/21/2023 stating he had been doing very well from the cardiac perspective.  He continued to have elevated blood pressures even with previously having his metoprolol switched to carvedilol and started on spironolactone.  Upon medication review was noted he had increased his carvedilol dosing but was not taking it twice daily was only taken it once daily.  He was encouraged to continue to monitor his pressures 1 to 2 hours postmedication administration.  There were no further changes made to his medication regimen and no further testing that was ordered.  He returns to clinic today stating that he has been doing well.  He had just donated platelets this morning.  Has been compliant with his current medication regimen with his blood pressures running at home around 130 over 70s.  At his last visit there was some question of carvedilol to be taken once daily or twice daily and he was only taken once daily.  Since starting on twice daily dose and his blood pressure has been much improved at home.  He denies any chest pain, shortness of breath, lightheadedness/dizziness or peripheral edema.  Denies any hospitalizations or visits to the emergency department.  ROS: 10 point review of systems has been reviewed and considered  negative with exception of what is been listed in the HPI  Studies Reviewed: Blake Garcia        TTE 07/29/22  1. Left ventricular ejection fraction, by estimation, is 60 to 65%. The  left ventricle has normal function. The left ventricle has no regional  wall motion abnormalities. Left ventricular diastolic function could not  be evaluated.   2. Right ventricular systolic function is  normal. The right ventricular  size is normal. There is mildly elevated pulmonary artery systolic  pressure.   3. Small pericardial effusion unchanged from prior to device  implantation.   4. The mitral valve is normal in structure. No evidence of mitral valve  regurgitation. No evidence of mitral stenosis.   5. The aortic valve is tricuspid. Aortic valve regurgitation is not  visualized. No aortic stenosis is present.   6. The inferior vena cava is normal in size with greater than 50%  respiratory variability, suggesting right atrial pressure of 3 mmHg.    Limited TTE 07/29/22 1. Image quality poor. Unable to fully assess systolic function.   2. The left ventricle has no regional wall motion abnormalities.   3. Right ventricular systolic function is normal. The right ventricular  size is normal.   4. A small pericardial effusion is present. The pericardial effusion is  anterior to the right ventricle.   5. The mitral valve is normal in structure. No evidence of mitral valve  regurgitation. No evidence of mitral stenosis.   6. The aortic valve is normal in structure. Aortic valve regurgitation is  not visualized. No aortic stenosis is present.   7. The inferior vena cava is normal in size with greater than 50%  respiratory variability, suggesting right atrial pressure of 3 mmHg.    TTE 07/28/22 1. Left ventricular ejection fraction, by estimation, is 55 to 60%. The  left ventricle has normal function. The left ventricle has no regional  wall motion abnormalities. The left ventricular internal cavity size was  mildly dilated. There is moderate  left ventricular hypertrophy. Left ventricular diastolic parameters are  consistent with Grade II diastolic dysfunction (pseudonormalization).   2. Right ventricular systolic function is normal. The right ventricular  size is normal. There is normal pulmonary artery systolic pressure.   3. Left atrial size was moderately dilated.   4. Right  atrial size was mildly dilated.   5. The mitral valve is normal in structure. Mild to moderate mitral valve  regurgitation. No evidence of mitral stenosis.   6. The aortic valve is normal in structure. Aortic valve regurgitation is  not visualized. Aortic valve sclerosis is present, with no evidence of  aortic valve stenosis.   7. The inferior vena cava is dilated in size with >50% respiratory  variability, suggesting right atrial pressure of 8 mmHg.  Risk Assessment/Calculations:     HYPERTENSION CONTROL Vitals:   05/23/23 1533 05/23/23 1545  BP: (!) 150/91 (!) 142/80    The patient's blood pressure is elevated above target today.  In order to address the patient's elevated BP: Blood pressure will be monitored at home to determine if medication changes need to be made.; The blood pressure is usually elevated in clinic.  Blood pressures monitored at home have been optimal.          Physical Exam:   VS:  BP (!) 142/80 (BP Location: Left Arm, Patient Position: Sitting, Cuff Size: Large)   Pulse 70   Ht 5\' 7"  (1.702 m)   Wt 291 lb  9.6 oz (132.3 kg)   SpO2 96%   BMI 45.67 kg/m    Wt Readings from Last 3 Encounters:  05/23/23 291 lb 9.6 oz (132.3 kg)  04/21/23 289 lb 3.2 oz (131.2 kg)  03/28/23 290 lb 9.6 oz (131.8 kg)    GEN: Well nourished, well developed in no acute distress NECK: No JVD; No carotid bruits CARDIAC: RRR, no murmurs, rubs, gallops RESPIRATORY:  Clear to auscultation without rales, wheezing or rhonchi  ABDOMEN: Soft, non-tender, non-distended EXTREMITIES:  No edema; No deformity   ASSESSMENT AND PLAN: .   Primary hypertension with blood pressure 150/21 reviewed 142/80 and then again at 138/76.  Patient has been continued on amlodipine 10 mg daily, carvedilol 12.5 mg twice daily, Aldactone 25 mg daily, valsartan 320 mg daily continues to monitor blood pressures 1 to 2 hours postmedication administration at home and keeping blood pressure log blood pressures are  running 130s over 70s at home which is much improved.  He is slightly anxious about being at his appointment which is reliable.  New prescriptions were sent in times of Rx for 90 days per the patient's request today as there was no further medication changes that were made today.  Chronic HFpEF with his last echocardiogram being done in 2020 with an LVEF of 55 to 60%, no RWMA, G2 DD.  Denies any shortness of breath or dyspnea.  Appears to be euvolemic on exam today with NYHA class I symptoms.  He is continued on carvedilol, valsartan, and spironolactone.  Will consider addition of SGLT2 inhibitor to medication regimen on return.  Hyperlipidemia with associated type 2 diabetes with most recent LDL 52 which remains at goal on simvastatin 20 mg daily.  Diabetes continues to be managed by his PCP.  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  Most recent interrogation reviewed and stable.  Patient continues with remote uploads and clinic visits as scheduled.  Obstructive sleep apnea where he continues to be compliant with CPAP therapy.  Morbid obesity with a BMI of 45.67 status post gastric sleeve surgery.  He is continued to be congratulated on his continued weight loss.  As well as encouraged to continue with increasing his activity.       Dispo: Patient to return to clinic to see MD/APP in 6 months or sooner if needed  Signed, Elvie Palomo, NP

## 2023-05-23 NOTE — Patient Instructions (Signed)
Medication Instructions:  Your physician recommends that you continue on your current medications as directed. Please refer to the Current Medication list given to you today.  *If you need a refill on your cardiac medications before your next appointment, please call your pharmacy*  Lab Work: -None ordered  Testing/Procedures: -None ordered  Follow-Up: At [facility], you and your health needs are our priority.  As part of our continuing mission to provide you with exceptional heart care, we have created designated Provider Care Teams.  These Care Teams include your primary Cardiologist (physician) and Advanced Practice Providers (APPs -  Physician Assistants and Nurse Practitioners) who all work together to provide you with the care you need, when you need it.  Your next appointment:   6 month(s)  Provider:   Lorine Bears, MD or Charlsie Quest, NP    Other Instructions -None

## 2023-05-26 ENCOUNTER — Telehealth: Payer: Self-pay

## 2023-05-26 DIAGNOSIS — H2511 Age-related nuclear cataract, right eye: Secondary | ICD-10-CM | POA: Diagnosis not present

## 2023-05-26 DIAGNOSIS — H25013 Cortical age-related cataract, bilateral: Secondary | ICD-10-CM | POA: Diagnosis not present

## 2023-05-26 DIAGNOSIS — H18413 Arcus senilis, bilateral: Secondary | ICD-10-CM | POA: Diagnosis not present

## 2023-05-26 DIAGNOSIS — H25043 Posterior subcapsular polar age-related cataract, bilateral: Secondary | ICD-10-CM | POA: Diagnosis not present

## 2023-05-26 DIAGNOSIS — H2513 Age-related nuclear cataract, bilateral: Secondary | ICD-10-CM | POA: Diagnosis not present

## 2023-05-26 NOTE — Telephone Encounter (Signed)
..     Pre-operative Risk Assessment    Patient Name: Blake Garcia  DOB: 1956/01/22 MRN: [id number]      Request for Surgical Clearance    Procedure:   CATARACT EXTRACTION W/INTRAOCULAR LENS IMPLANTATION OF THE RIGHT EYE, FOLLOWED BY THE LEFT EYE  Date of Surgery:  Clearance 08/07/23                                 Surgeon:  DR Mia Creek Surgeon's Group or Practice Name:  [facility] Phone number:  [phone] Fax number:  [phone]   Type of Clearance Requested:   - Medical    Type of Anesthesia:  Not Indicated   Additional requests/questions:   LAST O/V 04/21/23  Signed, Renee Ramus   05/26/2023, 3:18 PM

## 2023-05-26 NOTE — Telephone Encounter (Signed)
   Patient Name: Blake Garcia  DOB: May 17, 1956 MRN: [id number]  Primary Cardiologist: Lorine Bears, MD  Chart reviewed as part of pre-operative protocol coverage. Cataract extractions are recognized in guidelines as low risk surgeries that do not typically require specific preoperative testing or holding of blood thinner therapy. Therefore, given past medical history and time since last visit, based on ACC/AHA guidelines, Blake Garcia would be at acceptable risk for the planned procedure without further cardiovascular testing.   I will route this recommendation to the requesting party via Epic fax function and remove from pre-op pool.  Please call with questions.  Levi Aland, NP-C  05/26/2023, 3:57 PM [address] Office [phone] Fax [phone]

## 2023-06-02 ENCOUNTER — Telehealth: Payer: Self-pay | Admitting: *Deleted

## 2023-06-02 DIAGNOSIS — E1169 Type 2 diabetes mellitus with other specified complication: Secondary | ICD-10-CM

## 2023-06-02 DIAGNOSIS — E118 Type 2 diabetes mellitus with unspecified complications: Secondary | ICD-10-CM

## 2023-06-02 NOTE — Telephone Encounter (Signed)
-----   Message from Alvina Chou sent at 06/02/2023  2:47 PM EDT ----- Regarding: Lab orders for [facility], 11.14.24 Lab orders fora 6 month rtn visit.

## 2023-06-04 ENCOUNTER — Other Ambulatory Visit: Payer: Self-pay | Admitting: Medical Genetics

## 2023-06-04 DIAGNOSIS — Z006 Encounter for examination for normal comparison and control in clinical research program: Secondary | ICD-10-CM

## 2023-06-05 ENCOUNTER — Other Ambulatory Visit
Admission: RE | Admit: 2023-06-05 | Discharge: 2023-06-05 | Disposition: A | Discharge status: Home / Self Care | Payer: Medicare Other | Source: Ambulatory Visit | Attending: Medical Genetics | Admitting: Medical Genetics

## 2023-06-05 DIAGNOSIS — Z006 Encounter for examination for normal comparison and control in clinical research program: Secondary | ICD-10-CM | POA: Insufficient documentation

## 2023-06-15 ENCOUNTER — Other Ambulatory Visit (INDEPENDENT_AMBULATORY_CARE_PROVIDER_SITE_OTHER): Payer: Medicare Other

## 2023-06-15 DIAGNOSIS — E1169 Type 2 diabetes mellitus with other specified complication: Secondary | ICD-10-CM | POA: Diagnosis not present

## 2023-06-15 DIAGNOSIS — E785 Hyperlipidemia, unspecified: Secondary | ICD-10-CM | POA: Diagnosis not present

## 2023-06-15 DIAGNOSIS — E118 Type 2 diabetes mellitus with unspecified complications: Secondary | ICD-10-CM | POA: Diagnosis not present

## 2023-06-15 DIAGNOSIS — R972 Elevated prostate specific antigen [PSA]: Secondary | ICD-10-CM

## 2023-06-15 LAB — LIPID PANEL
Cholesterol: 132 mg/dL (ref 0–200)
HDL: 48.9 mg/dL (ref >39.00)
LDL Cholesterol: 64 mg/dL (ref 0–99)
NonHDL: 82.62
Total CHOL/HDL Ratio: 3
Triglycerides: 93 mg/dL (ref 0.0–149.0)
VLDL: 18.6 mg/dL (ref 0.0–40.0)

## 2023-06-15 LAB — COMPREHENSIVE METABOLIC PANEL
ALT: 26 U/L (ref 0–53)
AST: 24 U/L (ref 0–37)
Albumin: 3.9 g/dL (ref 3.5–5.2)
Alkaline Phosphatase: 93 U/L (ref 39–117)
BUN: 16 mg/dL (ref 6–23)
CO2: 32 meq/L (ref 19–32)
Calcium: 9.2 mg/dL (ref 8.4–10.5)
Chloride: 103 meq/L (ref 96–112)
Creatinine, Ser: 0.92 mg/dL (ref 0.40–1.50)
GFR: 86.57 mL/min (ref >60.00)
Glucose, Bld: 87 mg/dL (ref 70–99)
Potassium: 3.8 meq/L (ref 3.5–5.1)
Sodium: 141 meq/L (ref 135–145)
Total Bilirubin: 0.4 mg/dL (ref 0.2–1.2)
Total Protein: 7.3 g/dL (ref 6.0–8.3)

## 2023-06-15 LAB — MICROALBUMIN / CREATININE URINE RATIO
Creatinine,U: 195 mg/dL
Microalb Creat Ratio: 2.1 mg/g (ref 0.0–30.0)
Microalb, Ur: 4 mg/dL — ABNORMAL HIGH (ref 0.0–1.9)

## 2023-06-15 LAB — HEMOGLOBIN A1C: Hgb A1c MFr Bld: 5.3 % (ref 4.6–6.5)

## 2023-06-15 NOTE — Progress Notes (Signed)
No critical labs need to be addressed urgently. We will discuss labs in detail at upcoming office visit.   

## 2023-06-16 LAB — HELIX MOLECULAR SCREEN: Genetic Analysis Overall Interpretation: NEGATIVE

## 2023-06-16 LAB — GENECONNECT MOLECULAR SCREEN

## 2023-06-19 LAB — PSA, TOTAL AND FREE
PSA, % Free: 31 % (ref >25)
PSA, Free: 1.1 ng/mL
PSA, Total: 3.6 ng/mL (ref <=4.0)

## 2023-06-20 NOTE — Progress Notes (Signed)
No critical labs need to be addressed urgently. We will discuss labs in detail at upcoming office visit.   

## 2023-06-22 ENCOUNTER — Ambulatory Visit: Payer: Medicare Other | Admitting: Family Medicine

## 2023-06-22 ENCOUNTER — Encounter: Payer: Self-pay | Admitting: Family Medicine

## 2023-06-22 VITALS — BP 122/74 | HR 68 | Temp 98.6°F | Ht 67.0 in | Wt 290.0 lb

## 2023-06-22 DIAGNOSIS — R972 Elevated prostate specific antigen [PSA]: Secondary | ICD-10-CM

## 2023-06-22 DIAGNOSIS — Z6841 Body Mass Index (BMI) 40.0 and over, adult: Secondary | ICD-10-CM

## 2023-06-22 DIAGNOSIS — E1159 Type 2 diabetes mellitus with other circulatory complications: Secondary | ICD-10-CM

## 2023-06-22 DIAGNOSIS — E785 Hyperlipidemia, unspecified: Secondary | ICD-10-CM

## 2023-06-22 DIAGNOSIS — E1169 Type 2 diabetes mellitus with other specified complication: Secondary | ICD-10-CM

## 2023-06-22 DIAGNOSIS — I152 Hypertension secondary to endocrine disorders: Secondary | ICD-10-CM

## 2023-06-22 DIAGNOSIS — E118 Type 2 diabetes mellitus with unspecified complications: Secondary | ICD-10-CM

## 2023-06-22 DIAGNOSIS — E66813 Obesity, class 3: Secondary | ICD-10-CM

## 2023-06-22 DIAGNOSIS — Z8042 Family history of malignant neoplasm of prostate: Secondary | ICD-10-CM

## 2023-06-22 MED ORDER — TRIAMCINOLONE ACETONIDE 0.1 % EX CREA: TOPICAL_CREAM | CUTANEOUS | 0 refills | Status: AC

## 2023-06-22 NOTE — Assessment & Plan Note (Signed)
Stable, chronic.  Continue current medication.   well controlled LDL at goal on simvastatin 20 mg daily  

## 2023-06-22 NOTE — Assessment & Plan Note (Signed)
Wt Readings from Last 3 Encounters:  06/22/23 290 lb (131.5 kg)  05/23/23 291 lb 9.6 oz (132.3 kg)  04/21/23 289 lb 3.2 oz (131.2 kg)

## 2023-06-22 NOTE — Progress Notes (Signed)
Patient ID: Blake Garcia, male    DOB: [birth year], 67 y.o.   MRN: [id number]  This visit was conducted in person.  BP 122/74 (BP Location: Right Arm, Patient Position: Sitting, Cuff Size: Large)   Pulse 68   Temp 98.6 F (37 C) (Temporal)   Ht 5\' 7"  (1.702 m)   Wt 290 lb (131.5 kg)   SpO2 94%   BMI 45.42 kg/m    CC:  Chief Complaint  Patient presents with   Diabetes    Subjective:   HPI: Blake Garcia is a 67 y.o. male presenting on 06/22/2023 for Diabetes  Diabetes:  resolved s/p bariatric surgery Lab Results  Component Value Date   HGBA1C 5.3 06/15/2023  Using medications without difficulties: Hypoglycemic episodes: Hyperglycemic episodes: Feet problems: no ulcers Blood Sugars averaging: not checking eye exam within last year: yes  Hypertension:    Good  control  on amlodipine 10 mg daily,valsartan 320 mg daily , spironolactone 25 mg daily, coreg 12.5 BID  Seeing cardiology   BP Readings from Last 3 Encounters:  06/22/23 122/74  05/23/23 (!) 142/80  04/21/23 (!) 144/82  Using medication without problems or lightheadedness:  none Chest pain with exertion:none Edema:none Short of breath:none Average home BPs:  142/86, 132/86. Other issues:  Wt Readings from Last 3 Encounters:  06/22/23 290 lb (131.5 kg)  05/23/23 291 lb 9.6 oz (132.3 kg)  04/21/23 289 lb 3.2 oz (131.2 kg)  Body mass index is 45.42 kg/m.    Elevated Cholesterol: LDL at goal on simvastatin 20 mg daily,  Lab Results  Component Value Date   CHOL 132 06/15/2023   HDL 48.90 06/15/2023   LDLCALC 64 06/15/2023   LDLDIRECT 153.9 09/10/2007   TRIG 93.0 06/15/2023   CHOLHDL 3 06/15/2023  Using medications without problems: Muscle aches:  Diet compliance: good Exercise: walking regularly, working in yard Other complaints:      Increasing PSA...  father with prostate cancer.   Relevant past medical, surgical, family and social history reviewed and updated as indicated.  Interim medical history since our last visit reviewed. Allergies and medications reviewed and updated. Outpatient Medications Prior to Visit  Medication Sig Dispense Refill   amLODipine (NORVASC) 10 MG tablet Take 1 tablet (10 mg total) by mouth daily. 90 tablet 3   Calcium Carb-Cholecalciferol (CALCIUM 500 + D PO) Take 3 tablets by mouth every evening.     carvedilol (COREG) 12.5 MG tablet Take 1 tablet (12.5 mg total) by mouth 2 (two) times daily with a meal. 180 tablet 3   Multiple Vitamins-Minerals (BARIATRIC MULTIVITAMINS/IRON) CAPS Take 1 capsule by mouth daily.     potassium chloride SA (KLOR-CON M) 20 MEQ tablet Take 1 tablet (20 mEq total) by mouth daily. TAKE ONE TABLET DAILY     simvastatin (ZOCOR) 20 MG tablet Take 1 tablet (20 mg total) by mouth at bedtime. 90 tablet 3   spironolactone (ALDACTONE) 25 MG tablet Take 1 tablet (25 mg total) by mouth daily. 90 tablet 3   valsartan (DIOVAN) 320 MG tablet Take 1 tablet (320 mg total) by mouth daily. 90 tablet 3   venlafaxine XR (EFFEXOR-XR) 75 MG 24 hr capsule TAKE 3 CAPSULES BY MOUTH ONCE DAILY 270 capsule 3   triamcinolone cream (KENALOG) 0.1 % APPLY TO AFFECTED AREAS TWICE A DAY IF NEEDED 100 g 0   nystatin (MYCOSTATIN/NYSTOP) powder Apply 1 Application topically 2 (two) times daily as needed (for dryness).     nystatin-triamcinolone (  MYCOLOG II) cream Apply 1 Application topically 2 (two) times daily as needed.     No facility-administered medications prior to visit.     Per HPI unless specifically indicated in ROS section below Review of Systems  Constitutional:  Negative for fatigue and fever.  HENT:  Negative for ear pain.   Eyes:  Negative for pain.  Respiratory:  Negative for cough and shortness of breath.   Cardiovascular:  Negative for chest pain, palpitations and leg swelling.  Gastrointestinal:  Negative for abdominal pain.  Genitourinary:  Negative for dysuria.  Musculoskeletal:  Negative for arthralgias.   Neurological:  Negative for syncope, light-headedness and headaches.  Psychiatric/Behavioral:  Negative for dysphoric mood.    Objective:  BP 122/74 (BP Location: Right Arm, Patient Position: Sitting, Cuff Size: Large)   Pulse 68   Temp 98.6 F (37 C) (Temporal)   Ht 5\' 7"  (1.702 m)   Wt 290 lb (131.5 kg)   SpO2 94%   BMI 45.42 kg/m   Wt Readings from Last 3 Encounters:  06/22/23 290 lb (131.5 kg)  05/23/23 291 lb 9.6 oz (132.3 kg)  04/21/23 289 lb 3.2 oz (131.2 kg)      Physical Exam Constitutional:      Appearance: He is well-developed.  HENT:     Head: Normocephalic.     Right Ear: Hearing normal.     Left Ear: Hearing normal.     Nose: Nose normal.  Neck:     Thyroid: No thyroid mass or thyromegaly.     Vascular: No carotid bruit.     Trachea: Trachea normal.  Cardiovascular:     Rate and Rhythm: Normal rate and regular rhythm.     Pulses: Normal pulses.     Heart sounds: Heart sounds not distant. No murmur heard.    No friction rub. No gallop.     Comments: No peripheral edema Pulmonary:     Effort: Pulmonary effort is normal. No respiratory distress.     Breath sounds: Normal breath sounds.  Skin:    General: Skin is warm and dry.     Findings: No rash.  Psychiatric:        Speech: Speech normal.        Behavior: Behavior normal.        Thought Content: Thought content normal.          Results for orders placed or performed in visit on 06/15/23  Microalbumin / creatinine urine ratio  Result Value Ref Range   Microalb, Ur 4.0 (H) 0.0 - 1.9 mg/dL   Creatinine,U 161.0 mg/dL   Microalb Creat Ratio 2.1 0.0 - 30.0 mg/g  Lipid panel  Result Value Ref Range   Cholesterol 132 0 - 200 mg/dL   Triglycerides 96.0 0.0 - 149.0 mg/dL   HDL 45.40 >98.11 mg/dL   VLDL 91.4 0.0 - 78.2 mg/dL   LDL Cholesterol 64 0 - 99 mg/dL   Total CHOL/HDL Ratio 3    NonHDL 82.62   Hemoglobin A1c  Result Value Ref Range   Hgb A1c MFr Bld 5.3 4.6 - 6.5 %  Comprehensive  metabolic panel  Result Value Ref Range   Sodium 141 135 - 145 mEq/L   Potassium 3.8 3.5 - 5.1 mEq/L   Chloride 103 96 - 112 mEq/L   CO2 32 19 - 32 mEq/L   Glucose, Bld 87 70 - 99 mg/dL   BUN 16 6 - 23 mg/dL   Creatinine, Ser 9.56 0.40 - 1.50  mg/dL   Total Bilirubin 0.4 0.2 - 1.2 mg/dL   Alkaline Phosphatase 93 39 - 117 U/L   AST 24 0 - 37 U/L   ALT 26 0 - 53 U/L   Total Protein 7.3 6.0 - 8.3 g/dL   Albumin 3.9 3.5 - 5.2 g/dL   GFR 19.14 >78.29 mL/min   Calcium 9.2 8.4 - 10.5 mg/dL  PSA, total and free  Result Value Ref Range   PSA, Total 3.6 < OR = 4.0 ng/mL   PSA, Free 1.1 ng/mL   PSA, % Free 31 >25 % (calc)     COVID 19 screen:  No recent travel or known exposure to COVID19 The patient denies respiratory symptoms of COVID 19 at this time. The importance of social distancing was discussed today.   Assessment and Plan    Problem List Items Addressed This Visit     Abnormal PSA    Gradually increasing PSA over the last year.  Father with prostate cancer history. Discussed referral to urology versus continuing to follow this with recheck in 6 months.  He has opted to follow this over time.      Relevant Orders   PSA, total and free   Controlled type 2 diabetes mellitus with complication, without long-term current use of insulin (HCC) (Chronic)   Hyperlipidemia associated with type 2 diabetes mellitus (HCC) (Chronic)    Stable, chronic.  Continue current medication.   well controlled LDL at goal on simvastatin 20 mg daily      Relevant Orders   Hemoglobin A1c   Lipid panel   Comprehensive metabolic panel   Hypertension associated with diabetes (HCC) - Primary (Chronic)    Stable, chronic.  Continue current medication.    On amlodipine 10 mg daily,valsartan 320 mg daily , spironolactone 25 mg daily, coreg 12.5 BID      Obesity, Class III, BMI 40-49.9 (morbid obesity) (HCC)    Wt Readings from Last 3 Encounters:  06/22/23 290 lb (131.5 kg)  05/23/23 291 lb  9.6 oz (132.3 kg)  04/21/23 289 lb 3.2 oz (131.2 kg)         Other Visit Diagnoses     Family history of prostate cancer in father       Relevant Orders   PSA, total and free         Kerby Nora, MD

## 2023-06-22 NOTE — Assessment & Plan Note (Signed)
Gradually increasing PSA over the last year.  Father with prostate cancer history. Discussed referral to urology versus continuing to follow this with recheck in 6 months.  He has opted to follow this over time.

## 2023-06-22 NOTE — Assessment & Plan Note (Addendum)
Stable, chronic.  Continue current medication.    On amlodipine 10 mg daily,valsartan 320 mg daily , spironolactone 25 mg daily, coreg 12.5 BID

## 2023-06-23 ENCOUNTER — Encounter: Payer: Self-pay | Admitting: Family Medicine

## 2023-06-23 DIAGNOSIS — R972 Elevated prostate specific antigen [PSA]: Secondary | ICD-10-CM

## 2023-06-27 ENCOUNTER — Telehealth: Payer: Self-pay | Admitting: Family Medicine

## 2023-06-27 NOTE — Telephone Encounter (Signed)
Spoke with Blake Garcia.  He states [facility] sent him a message about the Rx for Triamcinolone Cream that was sent in. He states [facility] said they had reached out to [name] but hadn't heard back.  I do not see where we have received anything from [facility] in regard to this.   He was also following  up on the MyChart message he sent Dr. Ermalene Searing about proceeding with the referral to Urology.  I advised Blake Garcia that we did get his MyChart about the referral but Dr. Ermalene Searing is out of the office this week.  Once she places the referral,  our referral coordinators will reach out to him to get that scheduled.  I called [facility].  That just needed to clarify if the quantity we sent in was for a 90 day supply.  I confirmed quantity and they are going to overnight this to patient.  Left Blake Garcia a voicemail advising him of this.

## 2023-06-27 NOTE — Telephone Encounter (Signed)
Pt called in and stated that hew would like to speack with Dr Ermalene Searing CMA regarding his last appt and medication

## 2023-07-28 ENCOUNTER — Ambulatory Visit: Payer: Medicare Other

## 2023-07-28 DIAGNOSIS — I442 Atrioventricular block, complete: Secondary | ICD-10-CM

## 2023-07-29 LAB — CUP PACEART REMOTE DEVICE CHECK
Battery Remaining Longevity: 56 mo
Battery Remaining Percentage: 86 %
Battery Voltage: 2.98 V
Brady Statistic AP VP Percent: 30 %
Brady Statistic AP VS Percent: 1 %
Brady Statistic AS VP Percent: 66 %
Brady Statistic AS VS Percent: 1.7 %
Brady Statistic RA Percent Paced: 28 %
Brady Statistic RV Percent Paced: 96 %
Date Time Interrogation Session: 20241227020015
Implantable Lead Connection Status: 753985
Implantable Lead Connection Status: 753985
Implantable Lead Implant Date: 20231229
Implantable Lead Implant Date: 20231229
Implantable Lead Location: 753859
Implantable Lead Location: 753860
Implantable Pulse Generator Implant Date: 20231229
Lead Channel Impedance Value: 480 Ohm
Lead Channel Impedance Value: 530 Ohm
Lead Channel Pacing Threshold Amplitude: 0.75 V
Lead Channel Pacing Threshold Amplitude: 1 V
Lead Channel Pacing Threshold Pulse Width: 0.5 ms
Lead Channel Pacing Threshold Pulse Width: 0.5 ms
Lead Channel Sensing Intrinsic Amplitude: 12 mV
Lead Channel Sensing Intrinsic Amplitude: 2.1 mV
Lead Channel Setting Pacing Amplitude: 3.5 V
Lead Channel Setting Pacing Amplitude: 3.5 V
Lead Channel Setting Pacing Pulse Width: 0.5 ms
Lead Channel Setting Sensing Sensitivity: 2 mV
Pulse Gen Model: 2272
Pulse Gen Serial Number: 8139537

## 2023-07-31 ENCOUNTER — Ambulatory Visit: Payer: Medicare Other | Admitting: Urology

## 2023-07-31 VITALS — BP 154/79 | HR 66 | Ht 67.0 in | Wt 290.0 lb

## 2023-07-31 DIAGNOSIS — Z125 Encounter for screening for malignant neoplasm of prostate: Secondary | ICD-10-CM | POA: Diagnosis not present

## 2023-08-01 ENCOUNTER — Encounter: Payer: Self-pay | Admitting: Urology

## 2023-08-07 DIAGNOSIS — H25011 Cortical age-related cataract, right eye: Secondary | ICD-10-CM | POA: Diagnosis not present

## 2023-08-07 DIAGNOSIS — H2511 Age-related nuclear cataract, right eye: Secondary | ICD-10-CM | POA: Diagnosis not present

## 2023-08-07 DIAGNOSIS — H2512 Age-related nuclear cataract, left eye: Secondary | ICD-10-CM | POA: Diagnosis not present

## 2023-08-07 DIAGNOSIS — H25012 Cortical age-related cataract, left eye: Secondary | ICD-10-CM | POA: Diagnosis not present

## 2023-08-08 DIAGNOSIS — H25042 Posterior subcapsular polar age-related cataract, left eye: Secondary | ICD-10-CM | POA: Diagnosis not present

## 2023-08-08 DIAGNOSIS — H25012 Cortical age-related cataract, left eye: Secondary | ICD-10-CM | POA: Diagnosis not present

## 2023-08-08 DIAGNOSIS — H2512 Age-related nuclear cataract, left eye: Secondary | ICD-10-CM | POA: Diagnosis not present

## 2023-08-14 ENCOUNTER — Telehealth: Payer: Self-pay | Admitting: *Deleted

## 2023-08-14 NOTE — Telephone Encounter (Signed)
 Our office received what I believe is a duplicate request, see notes from clearance 05/2023, pt was cleared for the procedure with Dr. Lavonia per Rosaline Bane, NP. I called the surgeon office to confirm they received the clearance notes back in 05/2023 or is this a new procedure, though still dates the same surgery date as the original 08/07/23 which makes me think this is a duplicate.

## 2023-08-14 NOTE — Telephone Encounter (Signed)
 I left a message for surgery scheduler for Dr. Vonna Kotyk to call back to clarify.

## 2023-08-15 ENCOUNTER — Encounter: Payer: Self-pay | Admitting: Family Medicine

## 2023-08-15 NOTE — Telephone Encounter (Signed)
 Late entry: Blake Garcia with Dr. Vonna Kotyk office called and left vm yesterday that she never received the clearance and asked if notes could be re-faxed to [phone].

## 2023-08-16 MED ORDER — POTASSIUM CHLORIDE CRYS ER 20 MEQ PO TBCR: 20.0000 meq | EXTENDED_RELEASE_TABLET | Freq: Every day | ORAL | 1 refills | Status: DC

## 2023-08-28 DIAGNOSIS — H2512 Age-related nuclear cataract, left eye: Secondary | ICD-10-CM | POA: Diagnosis not present

## 2023-08-28 DIAGNOSIS — H25012 Cortical age-related cataract, left eye: Secondary | ICD-10-CM | POA: Diagnosis not present

## 2023-08-31 NOTE — Progress Notes (Signed)
Remote pacemaker transmission.

## 2023-08-31 NOTE — Addendum Note (Signed)
Addended by: Elease Etienne A on: 08/31/2023 12:06 PM   Modules accepted: Orders

## 2023-10-03 LAB — HM DIABETES EYE EXAM

## 2023-10-24 ENCOUNTER — Other Ambulatory Visit: Payer: Self-pay | Admitting: Family Medicine

## 2023-10-27 ENCOUNTER — Ambulatory Visit (INDEPENDENT_AMBULATORY_CARE_PROVIDER_SITE_OTHER): Payer: Medicare Other

## 2023-10-27 DIAGNOSIS — I442 Atrioventricular block, complete: Secondary | ICD-10-CM | POA: Diagnosis not present

## 2023-10-30 LAB — CUP PACEART REMOTE DEVICE CHECK
Battery Remaining Longevity: 54 mo
Battery Remaining Percentage: 82 %
Battery Voltage: 2.98 V
Brady Statistic AP VP Percent: 28 %
Brady Statistic AP VS Percent: 1 %
Brady Statistic AS VP Percent: 68 %
Brady Statistic AS VS Percent: 1.8 %
Brady Statistic RA Percent Paced: 25 %
Brady Statistic RV Percent Paced: 96 %
Date Time Interrogation Session: 20250328020013
Implantable Lead Connection Status: 753985
Implantable Lead Connection Status: 753985
Implantable Lead Implant Date: 20231229
Implantable Lead Implant Date: 20231229
Implantable Lead Location: 753859
Implantable Lead Location: 753860
Implantable Pulse Generator Implant Date: 20231229
Lead Channel Impedance Value: 490 Ohm
Lead Channel Impedance Value: 540 Ohm
Lead Channel Pacing Threshold Amplitude: 0.75 V
Lead Channel Pacing Threshold Amplitude: 1 V
Lead Channel Pacing Threshold Pulse Width: 0.5 ms
Lead Channel Pacing Threshold Pulse Width: 0.5 ms
Lead Channel Sensing Intrinsic Amplitude: 12 mV
Lead Channel Sensing Intrinsic Amplitude: 3.4 mV
Lead Channel Setting Pacing Amplitude: 3.5 V
Lead Channel Setting Pacing Amplitude: 3.5 V
Lead Channel Setting Pacing Pulse Width: 0.5 ms
Lead Channel Setting Sensing Sensitivity: 2 mV
Pulse Gen Model: 2272
Pulse Gen Serial Number: 8139537

## 2023-11-03 ENCOUNTER — Encounter: Payer: Self-pay | Admitting: Internal Medicine

## 2023-11-07 ENCOUNTER — Ambulatory Visit: Payer: Medicare Other | Admitting: Internal Medicine

## 2023-11-22 ENCOUNTER — Ambulatory Visit: Attending: Internal Medicine | Admitting: Internal Medicine

## 2023-11-22 VITALS — BP 132/72 | HR 70 | Ht 67.0 in | Wt 302.6 lb

## 2023-11-22 DIAGNOSIS — I10 Essential (primary) hypertension: Secondary | ICD-10-CM

## 2023-11-22 DIAGNOSIS — I442 Atrioventricular block, complete: Secondary | ICD-10-CM

## 2023-11-22 DIAGNOSIS — I493 Ventricular premature depolarization: Secondary | ICD-10-CM

## 2023-11-22 NOTE — Progress Notes (Signed)
 HPI Mr. Blake Garcia returns today for followup. He is a pleasant 68 yo man with a h/o high grade AV block, HTN, morbid obesity who underwent insertion of  a St. Jude DDD PM about 15 months ago. In the interim he notes he feels well. He chronic edema is much improved. No chest pain, sob, or syncope.  No Known Allergies   Current Outpatient Medications  Medication Sig Dispense Refill   amLODipine  (NORVASC ) 10 MG tablet Take 1 tablet (10 mg total) by mouth daily. 90 tablet 3   Calcium Carb-Cholecalciferol (CALCIUM 500 + D PO) Take 3 tablets by mouth every evening.     carvedilol  (COREG ) 12.5 MG tablet Take 1 tablet (12.5 mg total) by mouth 2 (two) times daily with a meal. 180 tablet 3   Multiple Vitamins-Minerals (BARIATRIC MULTIVITAMINS/IRON) CAPS Take 1 capsule by mouth daily.     potassium chloride  SA (KLOR-CON  M) 20 MEQ tablet Take 1 tablet (20 mEq total) by mouth daily. TAKE ONE TABLET DAILY 90 tablet 1   simvastatin  (ZOCOR ) 20 MG tablet Take 1 tablet (20 mg total) by mouth at bedtime. 90 tablet 3   spironolactone  (ALDACTONE ) 25 MG tablet Take 1 tablet (25 mg total) by mouth daily. 90 tablet 3   triamcinolone  cream (KENALOG ) 0.1 % APPLY TO AFFECTED AREAS TWICE A DAY IF NEEDED 454 g 0   valsartan  (DIOVAN ) 320 MG tablet Take 1 tablet (320 mg total) by mouth daily. 90 tablet 3   venlafaxine  XR (EFFEXOR -XR) 75 MG 24 hr capsule TAKE 3 CAPSULES BY MOUTH ONCE DAILY 270 capsule 3   No current facility-administered medications for this visit.     Past Medical History:  Diagnosis Date   Basal cell carcinoma [id number]   [facility]   Cellulitis 02/09/2012   RLE   Complication of anesthesia ~ 2000   "during OR for kidney stones; anesthesia RX made me code twice in one week"   Depression    Diastolic dysfunction    a. 11/2014 Echo: EF 50-55%, Gr 1 DD.   History of blood transfusion 1958   "I was an Rh baby"   History of kidney stones    Hyperlipidemia    Hypertension     Kidney stones    Midsternal chest pain    Morbid obesity (HCC)    On home oxygen therapy    OSA (obstructive sleep apnea)    "wear CPAP"   Personal history of colonic adenoma 09/14/2012   Pre-diabetes     ROS:   All systems reviewed and negative except as noted in the HPI.   Past Surgical History:  Procedure Laterality Date   CHOLECYSTECTOMY  1993   COLONOSCOPY     COLONOSCOPY WITH PROPOFOL  N/A 07/28/2020   Procedure: COLONOSCOPY WITH PROPOFOL ;  Surgeon: Kenney Peacemaker, MD;  Location: [facility];  Service: Endoscopy;  Laterality: N/A;   ESOPHAGOGASTRODUODENOSCOPY     KNEE ARTHROSCOPY  ~ 2004   right   LAPAROSCOPY     Surgical sleeve   LITHOTRIPSY  2000   PACEMAKER IMPLANT N/A 07/29/2022   Procedure: PACEMAKER IMPLANT;  Surgeon: Tammie Fall, MD;  Location: [facility];  Service: Cardiovascular;  Laterality: N/A;   POLYPECTOMY  07/28/2020   Procedure: POLYPECTOMY;  Surgeon: Kenney Peacemaker, MD;  Location: [facility];  Service: Endoscopy;;     Family History  Problem Relation Age of Onset   Hypertension Mother    Heart disease Mother  Heart disease Father    Stroke Father    Heart disease Brother    Coronary artery disease Brother    Diabetes Brother    Cancer Maternal Aunt        breast   Cancer Maternal Uncle        lung   Heart disease Brother    Coronary artery disease Brother    ADD / ADHD Son    ADD / ADHD Son    ADD / ADHD Daughter      Social History   Socioeconomic History   Marital status: Divorced    Spouse name: Not on file   Number of children: 3   Years of education: Not on file   Highest education level: Some college, no degree  Occupational History   Occupation: not employed  Tobacco Use   Smoking status: Never   Smokeless tobacco: Never  Vaping Use   Vaping status: Never Used  Substance and Sexual Activity   Alcohol use: No    Alcohol/week: 0.0 standard drinks of alcohol    Comment: 02/09/12 "may have driink at  party once/year"   Drug use: No   Sexual activity: Not Currently  Other Topics Concern   Not on file  Social History Narrative   Uses a scooter for ambulation   He is disabled   He is divorced and has 3 children   No alcohol never smoker no drug use no current tobacco   Social Drivers of Corporate investment banker Strain: Low Risk  (06/18/2023)   Overall Financial Resource Strain (CARDIA)    Difficulty of Paying Living Expenses: Not very hard  Food Insecurity: No Food Insecurity (06/18/2023)   Hunger Vital Sign    Worried About Running Out of Food in the Last Year: Never true    Ran Out of Food in the Last Year: Never true  Transportation Needs: No Transportation Needs (06/18/2023)   PRAPARE - Administrator, Civil Service (Medical): No    Lack of Transportation (Non-Medical): No  Physical Activity: Insufficiently Active (06/18/2023)   Exercise Vital Sign    Days of Exercise per Week: 2 days    Minutes of Exercise per Session: 20 min  Stress: No Stress Concern Present (06/18/2023)   [employer] of Occupational Health - Occupational Stress Questionnaire    Feeling of Stress : Only a little  Social Connections: Socially Integrated (06/18/2023)   Social Connection and Isolation Panel [NHANES]    Frequency of Communication with Friends and Family: More than three times a week    Frequency of Social Gatherings with Friends and Family: Once a week    Attends Religious Services: More than 4 times per year    Active Member of [employer] or Organizations: Yes    Attends Banker Meetings: More than 4 times per year    Marital Status: Living with partner  Intimate Partner Violence: Not At Risk (03/09/2022)   Humiliation, Afraid, Rape, and Kick questionnaire    Fear of Current or Ex-Partner: No    Emotionally Abused: No    Physically Abused: No    Sexually Abused: No     BP 132/72 (BP Location: Left Arm, Patient Position: Sitting, Cuff Size: Large)   Pulse  70   Ht 5\' 7"  (1.702 m)   Wt (!) 302 lb 9.6 oz (137.3 kg)   SpO2 94%   BMI 47.39 kg/m   Physical Exam:  Well appearing NAD HEENT: Unremarkable Neck:  No JVD, no thyromegally Lymphatics:  No adenopathy Back:  No CVA tenderness Lungs:  Clear HEART:  Regular rate rhythm, no murmurs, no rubs, no clicks Abd:  soft, positive bowel sounds, no organomegally, no rebound, no guarding Ext:  2 plus pulses, no edema, no cyanosis, no clubbing Skin:  No rashes no nodules Neuro:  CN II through XII intact, motor grossly intact  EKG - NSR with ventricular pacing  DEVICE  Normal device function.  See PaceArt for details.   Assess/Plan:  Symptomatic high grade AV block - he is asymptomatic s/p PPM insertion. PPM - his St. Jude DDD PM is working normally. His outputs were turned down today to maximize device longevity. He has a little far field oversensing of ventricular activity but the device is handling this ok. Obesity - he has gained weight back but notes that he once weighed over 500 lbs. HTN - his bp is well controlled.    Pete Brand Keshaun Dubey,MD

## 2023-11-22 NOTE — Patient Instructions (Signed)

## 2023-12-05 NOTE — Progress Notes (Signed)
 Remote pacemaker transmission.

## 2023-12-05 NOTE — Addendum Note (Signed)
 Addended by: Lott Rouleau A on: 12/05/2023 03:58 PM   Modules accepted: Orders

## 2023-12-15 ENCOUNTER — Ambulatory Visit: Payer: Self-pay | Admitting: Family Medicine

## 2023-12-15 ENCOUNTER — Encounter: Payer: Self-pay | Admitting: Family Medicine

## 2023-12-15 ENCOUNTER — Other Ambulatory Visit: Payer: Medicare Other

## 2023-12-15 DIAGNOSIS — E785 Hyperlipidemia, unspecified: Secondary | ICD-10-CM | POA: Diagnosis not present

## 2023-12-15 DIAGNOSIS — E1169 Type 2 diabetes mellitus with other specified complication: Secondary | ICD-10-CM

## 2023-12-15 DIAGNOSIS — R972 Elevated prostate specific antigen [PSA]: Secondary | ICD-10-CM

## 2023-12-15 DIAGNOSIS — Z8042 Family history of malignant neoplasm of prostate: Secondary | ICD-10-CM

## 2023-12-15 LAB — COMPREHENSIVE METABOLIC PANEL WITH GFR
ALT: 22 U/L (ref 0–53)
AST: 20 U/L (ref 0–37)
Albumin: 3.7 g/dL (ref 3.5–5.2)
Alkaline Phosphatase: 93 U/L (ref 39–117)
BUN: 13 mg/dL (ref 6–23)
CO2: 31 meq/L (ref 19–32)
Calcium: 8.6 mg/dL (ref 8.4–10.5)
Chloride: 102 meq/L (ref 96–112)
Creatinine, Ser: 0.82 mg/dL (ref 0.40–1.50)
GFR: 91.1 mL/min (ref >60.00)
Glucose, Bld: 104 mg/dL — ABNORMAL HIGH (ref 70–99)
Potassium: 3.6 meq/L (ref 3.5–5.1)
Sodium: 140 meq/L (ref 135–145)
Total Bilirubin: 0.4 mg/dL (ref 0.2–1.2)
Total Protein: 7 g/dL (ref 6.0–8.3)

## 2023-12-15 LAB — LIPID PANEL
Cholesterol: 135 mg/dL (ref 0–200)
HDL: 54.3 mg/dL (ref >39.00)
LDL Cholesterol: 66 mg/dL (ref 0–99)
NonHDL: 81.17
Total CHOL/HDL Ratio: 2
Triglycerides: 77 mg/dL (ref 0.0–149.0)
VLDL: 15.4 mg/dL (ref 0.0–40.0)

## 2023-12-15 LAB — HEMOGLOBIN A1C: Hgb A1c MFr Bld: 5.2 % (ref 4.6–6.5)

## 2023-12-15 NOTE — Progress Notes (Signed)
 No critical labs need to be addressed urgently. We will discuss labs in detail at upcoming office visit.

## 2023-12-18 LAB — PSA, TOTAL AND FREE
PSA, % Free: 31 % (ref >25)
PSA, Free: 0.9 ng/mL
PSA, Total: 2.9 ng/mL (ref <=4.0)

## 2023-12-22 ENCOUNTER — Ambulatory Visit (INDEPENDENT_AMBULATORY_CARE_PROVIDER_SITE_OTHER): Payer: Medicare Other | Admitting: Family Medicine

## 2023-12-22 ENCOUNTER — Encounter: Payer: Self-pay | Admitting: Family Medicine

## 2023-12-22 VITALS — BP 120/72 | HR 59 | Temp 97.7°F | Ht 67.0 in | Wt 301.2 lb

## 2023-12-22 DIAGNOSIS — F332 Major depressive disorder, recurrent severe without psychotic features: Secondary | ICD-10-CM

## 2023-12-22 DIAGNOSIS — E118 Type 2 diabetes mellitus with unspecified complications: Secondary | ICD-10-CM

## 2023-12-22 DIAGNOSIS — I5033 Acute on chronic diastolic (congestive) heart failure: Secondary | ICD-10-CM | POA: Diagnosis not present

## 2023-12-22 DIAGNOSIS — E66813 Obesity, class 3: Secondary | ICD-10-CM | POA: Diagnosis not present

## 2023-12-22 DIAGNOSIS — E785 Hyperlipidemia, unspecified: Secondary | ICD-10-CM | POA: Diagnosis not present

## 2023-12-22 DIAGNOSIS — Z0000 Encounter for general adult medical examination without abnormal findings: Secondary | ICD-10-CM

## 2023-12-22 DIAGNOSIS — E1169 Type 2 diabetes mellitus with other specified complication: Secondary | ICD-10-CM | POA: Diagnosis not present

## 2023-12-22 DIAGNOSIS — E1159 Type 2 diabetes mellitus with other circulatory complications: Secondary | ICD-10-CM

## 2023-12-22 DIAGNOSIS — E559 Vitamin D deficiency, unspecified: Secondary | ICD-10-CM | POA: Diagnosis not present

## 2023-12-22 DIAGNOSIS — I442 Atrioventricular block, complete: Secondary | ICD-10-CM | POA: Diagnosis not present

## 2023-12-22 DIAGNOSIS — I152 Hypertension secondary to endocrine disorders: Secondary | ICD-10-CM | POA: Diagnosis not present

## 2023-12-22 DIAGNOSIS — Z9884 Bariatric surgery status: Secondary | ICD-10-CM | POA: Diagnosis not present

## 2023-12-22 DIAGNOSIS — R972 Elevated prostate specific antigen [PSA]: Secondary | ICD-10-CM

## 2023-12-22 LAB — HM DIABETES FOOT EXAM

## 2023-12-22 NOTE — Assessment & Plan Note (Signed)
Chronic, asymptomatic since pacer placed. Followed by cardiology Dr. Ladona Ridgel

## 2023-12-22 NOTE — Assessment & Plan Note (Signed)
 Chronic, resolved status post bariatric surgery.  On no medication.

## 2023-12-22 NOTE — Assessment & Plan Note (Signed)
 Followed by cardiology  Euvolemic in office

## 2023-12-22 NOTE — Progress Notes (Signed)
 Patient ID: Blake Garcia, male    DOB: July 18, 1956, 68 y.o.   MRN: [id number]  This visit was conducted in person.  BP 120/72   Pulse (!) 59   Temp 97.7 F (36.5 C) (Skin)   Ht 5\' 7"  (1.702 m)   Wt (!) 301 lb 4 oz (136.6 kg)   SpO2 94%   BMI 47.18 kg/m    CC:  Chief Complaint  Patient presents with   Medicare Wellness    Subjective:   HPI: Blake Garcia is a 68 y.o. male presenting on 12/22/2023 for welcome to  Medicare Wellness  The patient presents for  medicare wellness,  and review of chronic health problems. He/She also has the following acute concerns today: none  I have personally reviewed the Medicare Annual Wellness questionnaire and have noted 1. The patient's medical and social history 2. Their use of alcohol, tobacco or illicit drugs 3. Their current medications and supplements 4. The patient's functional ability including ADL's, fall risks, home safety risks and hearing or visual             impairment. 5. Diet and physical activities 6. Evidence for depression or mood disorders 7.         Updated provider list Cognitive evaluation was performed and recorded on pt medicare questionnaire form. The patients weight, height, BMI and visual acuity have been recorded in the chart   I have made referrals, counseling and provided education to the patient based review of the above and I have provided the pt with a written personalized care plan for preventive services.   Documentation of this information was scanned into the electronic record under the media tab.   Advance directives and end of life planning reviewed in detail with patient and documented in EMR. Patient given handout on advance care directives if needed. HCPOA and living will updated if needed. No falls in last 12 months.  complete heart block, hypokalemia and acute on chronic diastolic heart failure Pacemaker implanted by Dr. Carolynne Citron Reviewed last office visit note from November 09, 2022 Dr.  Carolynne Citron   He has lost > 200 lbs with bariatric surgery, but has gained some back in the last 6 months Wt Readings from Last 3 Encounters:  12/22/23 (!) 301 lb 4 oz (136.6 kg)  11/22/23 (!) 302 lb 9.6 oz (137.3 kg)  07/31/23 290 lb (131.5 kg)  Body mass index is 47.18 kg/m.   Diabetes:   resolved s/p bariatric surgery, on no medication. Lab Results  Component Value Date   HGBA1C 5.2 12/15/2023  Using medications without difficulties: Hypoglycemic episodes: Hyperglycemic episodes: Feet problems:none Blood Sugars averaging: eye exam within last year:yes   Elevated Cholesterol:  well controlled LDL at goal on simvastatin  20 mg daily Lab Results  Component Value Date   CHOL 135 12/15/2023   HDL 54.30 12/15/2023   LDLCALC 66 12/15/2023   LDLDIRECT 153.9 09/10/2007   TRIG 77.0 12/15/2023   CHOLHDL 2 12/15/2023  Using medications without problems: none Muscle aches:  Diet compliance:yes Exercise:see above Other complaints:  Hypertension:   Well controlled  amlodipine  5 mg daily, metoprolol  25 mg daily, valsartan  320 daily  BP Readings from Last 3 Encounters:  12/22/23 120/72  11/22/23 132/72  07/31/23 (!) 154/79  Using medication without problems or lightheadedness:  Chest pain with exertion: Edema: Short of breath: Average home BPs: Other issues:  MDD: well controlled on effexor  75 mg 3 tabs daily.Aaron Aas splits dose for absorption. Flowsheet  Row Office Visit [facility]/22/20[facility]  PHQ-2 Total Score 1       Vit D low.. taking bariatric med.  Relevant past medical, surgical, family and social history reviewed and updated as indicated. Interim medical history since our last visit reviewed. Allergies and medications reviewed and updated. Outpatient Medications Prior to Visit  Medication Sig Dispense Refill   amLODipine  (NORVASC ) 10 MG tablet Take 1 tablet (10 mg total) by mouth daily. 90 tablet 3   Calcium Carb-Cholecalciferol  (CALCIUM 500 + D PO) Take 3 tablets by mouth every evening.     carvedilol  (COREG ) 12.5 MG tablet Take 1 tablet (12.5 mg total) by mouth 2 (two) times daily with a meal. 180 tablet 3   Multiple Vitamins-Minerals (BARIATRIC MULTIVITAMINS/IRON) CAPS Take 1 capsule by mouth daily.     potassium chloride  SA (KLOR-CON  M) 20 MEQ tablet Take 1 tablet (20 mEq total) by mouth daily. TAKE ONE TABLET DAILY 90 tablet 1   simvastatin  (ZOCOR ) 20 MG tablet Take 1 tablet (20 mg total) by mouth at bedtime. 90 tablet 3   spironolactone  (ALDACTONE ) 25 MG tablet Take 1 tablet (25 mg total) by mouth daily. 90 tablet 3   triamcinolone  cream (KENALOG ) 0.1 % APPLY TO AFFECTED AREAS TWICE A DAY IF NEEDED 454 g 0   valsartan  (DIOVAN ) 320 MG tablet Take 1 tablet (320 mg total) by mouth daily. 90 tablet 3   venlafaxine  XR (EFFEXOR -XR) 75 MG 24 hr capsule TAKE 3 CAPSULES BY MOUTH ONCE DAILY 270 capsule 3   No facility-administered medications prior to visit.     Per HPI unless specifically indicated in ROS section below Review of Systems  Constitutional:  Negative for fatigue and fever.  HENT:  Negative for ear pain.   Eyes:  Negative for pain.  Respiratory:  Negative for cough and shortness of breath.   Cardiovascular:  Negative for chest pain, palpitations and leg swelling.  Gastrointestinal:  Negative for abdominal pain.  Genitourinary:  Negative for dysuria.  Musculoskeletal:  Negative for arthralgias.  Neurological:  Negative for syncope, light-headedness and headaches.  Psychiatric/Behavioral:  Negative for dysphoric mood.    Objective:  BP 120/72   Pulse (!) 59   Temp 97.7 F (36.5 C) (Skin)   Ht 5\' 7"  (1.702 m)   Wt (!) 301 lb 4 oz (136.6 kg)   SpO2 94%   BMI 47.18 kg/m   Wt Readings from Last 3 Encounters:  12/22/23 (!) 301 lb 4 oz (136.6 kg)  11/22/23 (!) 302 lb 9.6 oz (137.3 kg)  07/31/23 290 lb (131.5 kg)      Physical Exam Constitutional:      General: He is not in acute distress.     Appearance: Normal appearance. He is well-developed. He is obese. He is not ill-appearing or toxic-appearing.  HENT:     Head: Normocephalic and atraumatic.     Right Ear: Hearing, tympanic membrane, ear canal and external ear normal.     Left Ear: Hearing, tympanic membrane, ear canal and external ear normal.     Nose: Nose normal.     Mouth/Throat:     Pharynx: Uvula midline.  Eyes:     General: Lids are normal. Lids are everted, no foreign bodies appreciated.     Conjunctiva/sclera: Conjunctivae normal.     Pupils: Pupils are equal, round, and reactive to light.  Neck:     Thyroid : No thyroid  mass or thyromegaly.  Vascular: No carotid bruit.     Trachea: Trachea and phonation normal.  Cardiovascular:     Rate and Rhythm: Normal rate and regular rhythm.     Pulses: Normal pulses.     Heart sounds: S1 normal and S2 normal. No murmur heard.    No gallop.  Pulmonary:     Breath sounds: Normal breath sounds. No wheezing, rhonchi or rales.  Abdominal:     General: Bowel sounds are normal.     Palpations: Abdomen is soft.     Tenderness: There is no abdominal tenderness. There is no guarding or rebound.     Hernia: No hernia is present.  Musculoskeletal:     Cervical back: Normal range of motion and neck supple.  Lymphadenopathy:     Cervical: No cervical adenopathy.  Skin:    General: Skin is warm and dry.     Findings: No rash.  Neurological:     Mental Status: He is alert.     Cranial Nerves: No cranial nerve deficit.     Sensory: No sensory deficit.     Gait: Gait normal.     Deep Tendon Reflexes: Reflexes are normal and symmetric.  Psychiatric:        Speech: Speech normal.        Behavior: Behavior normal.        Judgment: Judgment normal.      Diabetic foot exam: Normal inspection No skin breakdown Small great toe calluses  Normal DP pulses Normal sensation to light touch and monofilament Nails thickened at great toes  Results for orders placed or  performed in visit on 12/22/23  [facility] DIABETES FOOT EXAM   Collection Time: 12/22/23 12:00 AM  Result Value Ref Range   [facility] Diabetic Foot Exam done      COVID 19 screen:  No recent travel or known exposure to COVID19 The patient denies respiratory symptoms of COVID 19 at this time. The importance of social distancing was discussed today.   Assessment and Plan   The patient's preventative maintenance and recommended screening tests for an annual wellness exam were reviewed in full today. Brought up to date unless services declined.  Counselled on the importance of diet, exercise, and its role in overall health and mortality. The patient's FH and SH was reviewed, including their home life, tobacco status, and drug and alcohol status.   Vaccines:  uptodate shingrix, Td 2027, prevnar 20  Prostate Cancer Screen:   dad with pro cancer.  PSA 2.9, 6 month ago 3.6, 2024 2.15, 2023 1.77... trending up, but high percent free Colon Cancer Screen:  colonoscopy  07/2020 recall in 7 years      Smoking Status:none ETOH/ drug FAO:ZHYQ/MVHQ  Hep C: done  HIV screen:   done  Problem List Items Addressed This Visit     Abnormal PSA   Gradually increasing PSA over the 2 years  Father with prostate cancer history.  Low percent free but given rise and family history.  Urologist Dr. Cherylene Corrente... recommended following over time.      Acute on chronic diastolic (congestive) heart failure (HCC)   Followed by cardiology  Euvolemic in office      Complete heart block (HCC)   Chronic, asymptomatic since pacer placed. Followed by cardiology Dr. Carolynne Citron       Controlled type 2 diabetes mellitus with complication, without long-term current use of insulin (HCC) (Chronic)   Chronic, resolved status post bariatric surgery.  On no medication.  Relevant Orders   Hemoglobin A1c   Hyperlipidemia associated with type 2 diabetes mellitus (HCC) (Chronic)   Stable, chronic.  Continue current  medication.   well controlled LDL at goal on simvastatin  20 mg daily      Relevant Orders   Lipid panel   Comprehensive metabolic panel with GFR   Hypertension associated with diabetes (HCC) (Chronic)   Stable, chronic.  Continue current medication.    On amlodipine  10 mg daily,valsartan  320 mg daily , spironolactone  25 mg daily, coreg  12.5 BID      Major depressive disorder, recurrent episode, severe (HCC) (Chronic)   Stable, chronic.  Continue current medication.   effexor  75 mg 3 tabs daily.Aaron Aas splits dose for absorption.      Obesity, Class III, BMI 40-49.9 (morbid obesity)   S/P gastric bypass Encouraged exercise, weight loss, healthy eating habits.        Vitamin D  deficiency   De to bariatric surgery.Replete with Vit D 3 50000 IU weekly.      Relevant Orders   VITAMIN D  25 Hydroxy (Vit-D Deficiency, Fractures)   Other Visit Diagnoses       Medicare annual wellness visit, subsequent    -  Primary     History of bariatric surgery       Relevant Orders   CBC with Differential/Platelet   IBC + Ferritin   Vitamin B12       Herby Lolling, MD

## 2023-12-22 NOTE — Assessment & Plan Note (Addendum)
 Gradually increasing PSA over the 2 years, but then at last check has dropped down. Father with prostate cancer history.  Low percent free but given rise and family history.  Urologist Dr. Cherylene Corrente... recommended following over time.... will return to check yearly at next years CPX.

## 2023-12-22 NOTE — Assessment & Plan Note (Signed)
Stable, chronic.  Continue current medication.   well controlled LDL at goal on simvastatin 20 mg daily  

## 2023-12-22 NOTE — Assessment & Plan Note (Signed)
 De to bariatric surgery.Replete with Vit D 3 50000 IU weekly.

## 2023-12-22 NOTE — Assessment & Plan Note (Signed)
 Stable, chronic.  Continue current medication.    On amlodipine 10 mg daily,valsartan 320 mg daily , spironolactone 25 mg daily, coreg 12.5 BID

## 2023-12-22 NOTE — Assessment & Plan Note (Signed)
Stable, chronic.  Continue current medication.   effexor 75 mg 3 tabs daily.[name] splits dose for absorption.

## 2023-12-22 NOTE — Assessment & Plan Note (Addendum)
 S/P gastric bypass Encouraged exercise, weight loss, healthy eating habits.

## 2023-12-26 ENCOUNTER — Other Ambulatory Visit: Payer: Self-pay | Admitting: Family Medicine

## 2023-12-26 ENCOUNTER — Ambulatory Visit: Attending: Cardiology | Admitting: Cardiology

## 2023-12-26 ENCOUNTER — Encounter: Payer: Self-pay | Admitting: Cardiology

## 2023-12-26 VITALS — BP 130/70 | HR 60 | Ht 67.0 in | Wt 303.0 lb

## 2023-12-26 DIAGNOSIS — I442 Atrioventricular block, complete: Secondary | ICD-10-CM | POA: Diagnosis not present

## 2023-12-26 DIAGNOSIS — I10 Essential (primary) hypertension: Secondary | ICD-10-CM | POA: Diagnosis not present

## 2023-12-26 DIAGNOSIS — E1169 Type 2 diabetes mellitus with other specified complication: Secondary | ICD-10-CM

## 2023-12-26 DIAGNOSIS — E785 Hyperlipidemia, unspecified: Secondary | ICD-10-CM

## 2023-12-26 DIAGNOSIS — G4733 Obstructive sleep apnea (adult) (pediatric): Secondary | ICD-10-CM

## 2023-12-26 DIAGNOSIS — E66813 Obesity, class 3: Secondary | ICD-10-CM

## 2023-12-26 DIAGNOSIS — I5032 Chronic diastolic (congestive) heart failure: Secondary | ICD-10-CM

## 2023-12-26 DIAGNOSIS — Z950 Presence of cardiac pacemaker: Secondary | ICD-10-CM | POA: Diagnosis not present

## 2023-12-26 DIAGNOSIS — F3342 Major depressive disorder, recurrent, in full remission: Secondary | ICD-10-CM

## 2023-12-26 NOTE — Progress Notes (Signed)
 Cardiology Office Note:  .   Date:  12/26/2023  ID:  Jyl Or, DOB January 24, 1956, MRN [id number] PCP: Judithann Novas, MD  [facility] Providers Cardiologist:  Antionette Kirks, MD    History of Present Illness: Blake Garcia   Blake Garcia is a 68 y.o. male with a past medical history of hypertension, hyperlipidemia, obstructive sleep apnea on CPAP, type 2 diabetes, chronic diastolic heart failure, sick sinus syndrome status post permanent pacemaker placement, who is here today for follow-up.   Initially established with cardiology in 2016 for evaluation of midsternal chest discomfort.  Body habitus was making ischemic evaluation challenging.  Echocardiogram in 11/2014 showed normal LV function.  He continued to feel chest discomfort on his return appointment in 09/2019.  He stated it was painful with rest and exertion and had associated shortness of breath.  At that time he also had frequent PVCs on EKG and a 2-week ZIO monitor was ordered.  Monitor revealed normal sinus rhythm with 3 episodes of wide-complex tachycardia up to 15 beats.  Echocardiogram revealed an LVEF of greater than 55% with severe left ventricular hypertrophy.  He was evaluated by EP Dr. Rodolfo Clan in 2021 for evaluation of his ectopy and was started on beta-blocker therapy.  He responded to metoprolol  with normal heart rates in the 80s.  In 2022 he was able to undergo gastric sleeve surgery and had rapid weight loss with improvement in symptoms.  In 07/20/2022 been having chest pain and swelling over 3 to 5 days.  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  At that time he was sinus with evidence of acute on chronic diastolic heart failure and received IV diuresis with improvement in shortness of breath.  Echocardiogram revealed an LVEF of 60-65%, no RWMA, mildly elevated pulmonary artery systolic  pressure.  He continued to be in high-grade AV block despite holding beta-blockers and as a result was transferred to [facility] for pacemaker implantation.  On 07/29/2022 had successful implantation of a [facility] Jude dual-chamber pacemaker.   He was last seen in clinic 05/23/2023 stating that he had been doing well from a cardiac perspective.  Blood pressures had been more stable at home since he had been taking carvedilol  twice daily.  There were no other medication changes that were made or further testing that was ordered at that time.  He was encouraged to continue to follow with EP.  He returns to clinic today stating that the cardiac perspective he has been doing well.  He denies any chest pain, shortness of breath, palpitations, peripheral edema.  He has continued to follow with EP and had his settings changed on his pacemaker recently and states that he has no fatigue and is doing well.  He has been compliant with his medications with any undue side effects.  Recently followed with his PCP and had blood work done.  States that he continues to donate platelets every few weeks.  Has a very busy fall upcoming fall as he recently completed [employer].  Denies any hospitalizations or visits to the emergency department.  ROS: 10 point review of system has been reviewed and considered negative except ones been listed in the HPI  Studies Reviewed: Blake Garcia        TTE 07/29/22  1. Left ventricular ejection fraction, by estimation, is 60 to 65%. The  left ventricle has normal function. The left ventricle has no regional  wall motion abnormalities. Left ventricular diastolic function could not  be evaluated.   2. Right ventricular systolic function is normal. The right ventricular  size is normal. There is mildly elevated pulmonary artery systolic  pressure.   3. Small pericardial effusion unchanged from prior to device  implantation.   4. The mitral valve is normal in structure. No evidence of  mitral valve  regurgitation. No evidence of mitral stenosis.   5. The aortic valve is tricuspid. Aortic valve regurgitation is not  visualized. No aortic stenosis is present.   6. The inferior vena cava is normal in size with greater than 50%  respiratory variability, suggesting right atrial pressure of 3 mmHg.    Limited TTE 07/29/22 1. Image quality poor. Unable to fully assess systolic function.   2. The left ventricle has no regional wall motion abnormalities.   3. Right ventricular systolic function is normal. The right ventricular  size is normal.   4. A small pericardial effusion is present. The pericardial effusion is  anterior to the right ventricle.   5. The mitral valve is normal in structure. No evidence of mitral valve  regurgitation. No evidence of mitral stenosis.   6. The aortic valve is normal in structure. Aortic valve regurgitation is  not visualized. No aortic stenosis is present.   7. The inferior vena cava is normal in size with greater than 50%  respiratory variability, suggesting right atrial pressure of 3 mmHg.    TTE 07/28/22 1. Left ventricular ejection fraction, by estimation, is 55 to 60%. The  left ventricle has normal function. The left ventricle has no regional  wall motion abnormalities. The left ventricular internal cavity size was  mildly dilated. There is moderate  left ventricular hypertrophy. Left ventricular diastolic parameters are  consistent with Grade II diastolic dysfunction (pseudonormalization).   2. Right ventricular systolic function is normal. The right ventricular  size is normal. There is normal pulmonary artery systolic pressure.   3. Left atrial size was moderately dilated.   4. Right atrial size was mildly dilated.   5. The mitral valve is normal in structure. Mild to moderate mitral valve  regurgitation. No evidence of mitral stenosis.   6. The aortic valve is normal in structure. Aortic valve regurgitation is  not visualized.  Aortic valve sclerosis is present, with no evidence of  aortic valve stenosis.   7. The inferior vena cava is dilated in size with >50% respiratory  variability, suggesting right atrial pressure of 8 mmHg.  Risk Assessment/Calculations:             Physical Exam:   VS:  BP 130/70 (BP Location: Left Arm)   Pulse 60   Ht 5\' 7"  (1.702 m)   Wt (!) 303 lb (137.4 kg)   SpO2 95%   BMI 47.46 kg/m    Wt Readings from Last 3 Encounters:  12/26/23 (!) 303 lb (137.4 kg)  12/22/23 (!) 301 lb 4 oz (136.6 kg)  11/22/23 (!) 302 lb 9.6 oz (137.3 kg)    GEN: Well nourished, well developed in no acute distress NECK: No JVD; No carotid bruits CARDIAC: RRR, no murmurs, rubs, gallops RESPIRATORY:  Clear to auscultation without rales, wheezing or rhonchi  ABDOMEN: Soft, non-tender, non-distended EXTREMITIES:  No edema; No deformity   ASSESSMENT AND PLAN: .   Primary hypertension with his blood pressure today 130/70 which remains at goal.  He has been  continued on amlodipine  10 mg daily, carvedilol  12.5 mg twice daily, Aldactone  25 mg daily, and valsartan  320 mg daily.  Blood pressure has been well-controlled his most recent labs completed by his PCP remained stable.  He has been encouraged to continue to monitor his blood pressure 1 to 2 hours postmedication administration as well.  Chronic HFpEF with his last echocardiogram revealed an LVEF of 55 to 60%, no RWMA, G2 DD,.  He denies any shortness of breath or decompensation.  Continues to have very euvolemic on exam today with NYHA class I symptoms.  He is continued on carvedilol , valsartan , spironolactone .  SGLT2 inhibitor defer at this time due to cost.  Mixed hyperlipidemia with associated type 2 diabetes with last LDL of 66 and hemoglobin A1c of 5.2.  He has been continued on simvastatin  20 mg daily with ongoing management of his diabetes by his PCP.  Sick sinus syndrome status post permanent pacemaker with a [employer] Jude device where he recently  followed up with the EP Dr. Carolynne Citron.  Today he had device changes that were made to extend longevity of his battery.  He has been encouraged to continue with remote uploads and clinic visits as scheduled.  Obstructive sleep apnea with very remains compliant with CPAP therapy.  Morbid obesity with a BMI of 47.46.  He is status post gastric sleeve surgery.  He is encouraged to increase his activity as his weight is slowly increasing.       Dispo: Patient to return to clinic to see MD/APP in 11 to 12 months or sooner if needed for further evaluation.  Signed, Dimonique Bourdeau, NP

## 2023-12-26 NOTE — Patient Instructions (Signed)
 Medication Instructions:  Your Physician recommend you continue on your current medication as directed.    *If you need a refill on your cardiac medications before your next appointment, please call your pharmacy*  Lab Work: No labs ordered today  If you have labs (blood work) drawn today and your tests are completely normal, you will receive your results only by: MyChart Message (if you have MyChart) OR A paper copy in the mail If you have any lab test that is abnormal or we need to change your treatment, we will call you to review the results.  Testing/Procedures: No test ordered today   Follow-Up: At [facility], you and your health needs are our priority.  As part of our continuing mission to provide you with exceptional heart care, our providers are all part of one team.  This team includes your primary Cardiologist (physician) and Advanced Practice Providers or APPs (Physician Assistants and Nurse Practitioners) who all work together to provide you with the care you need, when you need it.  Your next appointment:   12 month(s)  Provider:   Antionette Kirks, MD or Ronald Cockayne, NP

## 2024-01-22 ENCOUNTER — Other Ambulatory Visit: Payer: Self-pay | Admitting: Cardiology

## 2024-01-22 ENCOUNTER — Other Ambulatory Visit: Payer: Self-pay | Admitting: Family Medicine

## 2024-01-23 ENCOUNTER — Other Ambulatory Visit: Payer: Self-pay | Admitting: Family Medicine

## 2024-01-23 ENCOUNTER — Telehealth

## 2024-01-23 NOTE — Progress Notes (Unsigned)
 SABRA

## 2024-01-24 ENCOUNTER — Ambulatory Visit
Admission: RE | Admit: 2024-01-24 | Discharge: 2024-01-24 | Disposition: A | Discharge status: Home / Self Care | Source: Ambulatory Visit | Attending: Emergency Medicine | Admitting: Emergency Medicine

## 2024-01-24 VITALS — BP 127/78 | HR 91 | Temp 99.0°F | Resp 18

## 2024-01-24 DIAGNOSIS — N3000 Acute cystitis without hematuria: Secondary | ICD-10-CM | POA: Diagnosis not present

## 2024-01-24 LAB — POCT URINALYSIS DIP (MANUAL ENTRY)
Bilirubin, UA: NEGATIVE
Glucose, UA: NEGATIVE mg/dL
Ketones, POC UA: NEGATIVE mg/dL
Nitrite, UA: POSITIVE — AB
Protein Ur, POC: 100 mg/dL — AB
Spec Grav, UA: 1.025 (ref 1.010–1.025)
Urobilinogen, UA: 0.2 U/dL
pH, UA: 6 (ref 5.0–8.0)

## 2024-01-24 MED ORDER — NITROFURANTOIN MONOHYD MACRO 100 MG PO CAPS: 100.0000 mg | ORAL_CAPSULE | Freq: Two times a day (BID) | ORAL | 0 refills | Status: DC

## 2024-01-24 NOTE — ED Provider Notes (Signed)
 Blake Garcia    CSN: [id number] Arrival date & time: 01/24/24  0856      History   Chief Complaint Chief Complaint  Patient presents with   Urinary Frequency    Possible UTI, pain when I pee, only pee a little and it hurts, urina is cloudy and smells. feel like I need to go often. - Entered by patient   Dysuria    HPI Blake Garcia is a 68 y.o. male.   Patient presents for evaluation of dysuria, urinary frequency, incomplete bladder emptying and a foul urinary odor and cloudy urine present for 3 days.  Experienced fever yesterday evening of 103.  Has attempted use of Tylenol .  Denies abdominal, flank pain.  Past Medical History:  Diagnosis Date   Basal cell carcinoma [id number]   [facility]   Cellulitis 02/09/2012   RLE   Complication of anesthesia ~ 2000   during OR for kidney stones; anesthesia RX made me code twice in one week   Depression    Diastolic dysfunction    a. 11/2014 Echo: EF 50-55%, Gr 1 DD.   History of blood transfusion 1958   I was an Rh baby   History of kidney stones    Hyperlipidemia    Hypertension    Kidney stones    Midsternal chest pain    Morbid obesity (HCC)    On home oxygen therapy    OSA (obstructive sleep apnea)    wear CPAP   Personal history of colonic adenoma 09/14/2012   Pre-diabetes     Patient Active Problem List   Diagnosis Date Noted   Vitamin D  deficiency 12/20/2022   Pacemaker 11/09/2022   PVC's (premature ventricular contractions) 11/09/2022   Hypokalemia 07/28/2022   Complete heart block (HCC) 07/27/2022   Encounter for power mobility device assessment 03/13/2022   Benign neoplasm of transverse colon    Controlled type 2 diabetes mellitus with complication, without long-term current use of insulin (HCC) 08/10/2018   Hypertension associated with diabetes (HCC)    Family history of early CAD 11/28/2014   RBBB 11/28/2014   History of colonic polyps 09/14/2012   Malignant basal cell neoplasm  of skin 02/21/2012   Acute on chronic diastolic (congestive) heart failure (HCC) 02/13/2012   Abnormal PSA 02/24/2011   Genital warts 02/24/2011   Obstructive sleep apnea 05/12/2009   Hyperlipidemia associated with type 2 diabetes mellitus (HCC) 06/21/2007   METABOLIC SYNDROME X 06/21/2007   Obesity, Class III, BMI 40-49.9 (morbid obesity) 06/21/2007   Major depressive disorder, recurrent episode, severe (HCC) 06/21/2007    Past Surgical History:  Procedure Laterality Date   CHOLECYSTECTOMY  1993   COLONOSCOPY     COLONOSCOPY WITH PROPOFOL  N/A 07/28/2020   Procedure: COLONOSCOPY WITH PROPOFOL ;  Surgeon: Avram Lupita BRAVO, MD;  Location: [facility] ENDOSCOPY;  Service: Endoscopy;  Laterality: N/A;   ESOPHAGOGASTRODUODENOSCOPY     KNEE ARTHROSCOPY  ~ 2004   right   LAPAROSCOPY     Surgical sleeve   LITHOTRIPSY  2000   PACEMAKER IMPLANT N/A 07/29/2022   Procedure: PACEMAKER IMPLANT;  Surgeon: Waddell Danelle ORN, MD;  Location: [facility] INVASIVE CV [facility];  Service: Cardiovascular;  Laterality: N/A;   POLYPECTOMY  07/28/2020   Procedure: POLYPECTOMY;  Surgeon: Avram Lupita BRAVO, MD;  Location: [facility] ENDOSCOPY;  Service: Endoscopy;;       Home Medications    Prior to Admission medications   Medication Sig Start Date End Date Taking? Authorizing Provider  nitrofurantoin,  macrocrystal-monohydrate, (MACROBID) 100 MG capsule Take 1 capsule (100 mg total) by mouth 2 (two) times daily for 7 days. 01/24/24 01/31/24 Yes Auden Tatar R, NP  amLODipine  (NORVASC ) 10 MG tablet Take 1 tablet (10 mg total) by mouth daily. 05/23/23   Gerard Frederick, NP  Calcium Carb-Cholecalciferol (CALCIUM 500 + D PO) Take 3 tablets by mouth every evening. 11/03/20   [provider]  carvedilol  (COREG ) 12.5 MG tablet Take 1 tablet (12.5 mg total) by mouth 2 (two) times daily with a meal. 05/23/23   Gerard Frederick, NP  Multiple Vitamins-Minerals (BARIATRIC MULTIVITAMINS/IRON) CAPS Take 1 capsule by mouth daily.    [provider]  potassium chloride  SA (KLOR-CON  M) 20 MEQ tablet TAKE 1 TABLET BY MOUTH DAILY 01/23/24   Bedsole, Amy E, MD  simvastatin  (ZOCOR ) 20 MG tablet TAKE 1 TABLET BY MOUTH AT  BEDTIME 01/22/24   Copland, Jacques, MD  spironolactone  (ALDACTONE ) 25 MG tablet Take 1 tablet (25 mg total) by mouth daily. 05/23/23   Hammock, Frederick, NP  triamcinolone  cream (KENALOG ) 0.1 % APPLY TO AFFECTED AREAS TWICE A DAY IF NEEDED 06/22/23   Bedsole, Amy E, MD  valsartan  (DIOVAN ) 320 MG tablet Take 1 tablet (320 mg total) by mouth daily. 05/23/23   Gerard Frederick, NP  venlafaxine  XR (EFFEXOR -XR) 75 MG 24 hr capsule TAKE 3 CAPSULES BY MOUTH ONCE DAILY 03/14/23   Avelina Greig BRAVO, MD    Family History Family History  Problem Relation Age of Onset   Hypertension Mother    Heart disease Mother    Heart disease Father    Stroke Father    Heart disease Brother    Coronary artery disease Brother    Diabetes Brother    Cancer Maternal Aunt        breast   Cancer Maternal Uncle        lung   Heart disease Brother    Coronary artery disease Brother    ADD / ADHD Son    ADD / ADHD Son    ADD / ADHD Daughter     Social History Social History   Tobacco Use   Smoking status: Never   Smokeless tobacco: Never  Vaping Use   Vaping status: Never Used  Substance Use Topics   Alcohol use: No    Alcohol/week: 0.0 standard drinks of alcohol    Comment: 02/09/12 may have driink at party once/year   Drug use: No     Allergies   Patient has no known allergies.   Review of Systems Review of Systems   Physical Exam Triage Vital Signs ED Triage Vitals  Encounter Vitals Group     BP 01/24/24 0931 127/78     Girls Systolic BP Percentile --      Girls Diastolic BP Percentile --      Boys Systolic BP Percentile --      Boys Diastolic BP Percentile --      Pulse Rate 01/24/24 0931 91     Resp 01/24/24 0931 18     Temp 01/24/24 0931 99 F (37.2 C)     Temp Source 01/24/24 0931 Oral     SpO2  01/24/24 0931 97 %     Weight --      Height --      Head Circumference --      Peak Flow --      Pain Score 01/24/24 0929 0     Pain Loc --  Pain Education --      Exclude from Growth Chart --    No data found.  Updated Vital Signs BP 127/78 (BP Location: Left Arm)   Pulse 91   Temp 99 F (37.2 C) (Oral)   Resp 18   SpO2 97%   Visual Acuity Right Eye Distance:   Left Eye Distance:   Bilateral Distance:    Right Eye Near:   Left Eye Near:    Bilateral Near:     Physical Exam Constitutional:      Appearance: Normal appearance.   Eyes:     Extraocular Movements: Extraocular movements intact.   Pulmonary:     Effort: Pulmonary effort is normal.  Abdominal:     Tenderness: There is abdominal tenderness in the suprapubic area. There is no right CVA tenderness or left CVA tenderness.   Neurological:     Mental Status: He is alert and oriented to person, place, and time.      UC Treatments / Results  Labs (all labs ordered are listed, but only abnormal results are displayed) Labs Reviewed  POCT URINALYSIS DIP (MANUAL ENTRY) - Abnormal; Notable for the following components:      Result Value   Color, UA orange (*)    Clarity, UA cloudy (*)    Blood, UA moderate (*)    Protein Ur, POC =100 (*)    Nitrite, UA Positive (*)    Leukocytes, UA Small (1+) (*)    All other components within normal limits  URINE CULTURE    EKG   Radiology No results found.  Procedures Procedures (including critical care time)  Medications Ordered in UC Medications - No data to display  Initial Impression / Assessment and Plan / UC Course  I have reviewed the triage vital signs and the nursing notes.  Pertinent labs & imaging results that were available during my care of the patient were reviewed by me and considered in my medical decision making (see chart for details).  Acute Cystitis without hematuria  Urinalysis positive for nitrates and leukocytes, discussed  with patient, sent for culture,initiated Macrobid recommended supportive care and advised follow-up as needed Final Clinical Impressions(s) / UC Diagnoses   Final diagnoses:  Acute cystitis without hematuria     Discharge Instructions      Your urinalysis shows Blake Garcia blood cells and nitrates which are indicative of infection, your urine will be sent to the lab to determine exactly which bacteria is present, if any changes need to be made to your medications you will be notified  Begin use of Macrobid twice daily for 7 days  You may use over-the-counter Azo to help minimize your symptoms until antibiotic removes bacteria, this medication will turn your urine orange  Increase your fluid intake through use of water  You may take Tylenol  as needed for discomfort  If symptoms continue to persist after use of medication or recur please follow-up with urgent care or your primary doctor as needed    ED Prescriptions     Medication Sig Dispense Auth. Provider   nitrofurantoin, macrocrystal-monohydrate, (MACROBID) 100 MG capsule Take 1 capsule (100 mg total) by mouth 2 (two) times daily for 7 days. 14 capsule Blake Garcia, Shelba SAUNDERS, NP      PDMP not reviewed this encounter.   Teresa Shelba SAUNDERS, [place] 01/24/24 [phone]

## 2024-01-24 NOTE — ED Triage Notes (Signed)
 Patient reports painful urination, cloudy urine and has an odor x 3 days. Denies pain at this time. Only hurts when he urinates.

## 2024-01-24 NOTE — Discharge Instructions (Signed)
 Your urinalysis shows Blake Garcia blood cells and nitrates which are indicative of infection, your urine will be sent to the lab to determine exactly which bacteria is present, if any changes need to be made to your medications you will be notified  Begin use of Macrobid twice daily for 7 days  You may use over-the-counter Azo to help minimize your symptoms until antibiotic removes bacteria, this medication will turn your urine orange  Increase your fluid intake through use of water  You may take Tylenol  as needed for discomfort  If symptoms continue to persist after use of medication or recur please follow-up with urgent care or your primary doctor as needed

## 2024-01-25 LAB — URINE CULTURE

## 2024-01-26 ENCOUNTER — Other Ambulatory Visit: Payer: Self-pay

## 2024-01-26 ENCOUNTER — Encounter (HOSPITAL_BASED_OUTPATIENT_CLINIC_OR_DEPARTMENT_OTHER): Payer: Self-pay

## 2024-01-26 ENCOUNTER — Inpatient Hospital Stay (HOSPITAL_BASED_OUTPATIENT_CLINIC_OR_DEPARTMENT_OTHER)
Admission: EM | Admit: 2024-01-26 | Discharge: 2024-01-30 | DRG: 872 | Disposition: A | Discharge status: Home / Self Care | Attending: Internal Medicine | Admitting: Internal Medicine

## 2024-01-26 ENCOUNTER — Emergency Department (HOSPITAL_BASED_OUTPATIENT_CLINIC_OR_DEPARTMENT_OTHER)

## 2024-01-26 ENCOUNTER — Ambulatory Visit: Payer: Self-pay

## 2024-01-26 ENCOUNTER — Ambulatory Visit (INDEPENDENT_AMBULATORY_CARE_PROVIDER_SITE_OTHER): Payer: Medicare Other

## 2024-01-26 ENCOUNTER — Ambulatory Visit (HOSPITAL_COMMUNITY): Payer: Self-pay

## 2024-01-26 DIAGNOSIS — Z8249 Family history of ischemic heart disease and other diseases of the circulatory system: Secondary | ICD-10-CM | POA: Diagnosis not present

## 2024-01-26 DIAGNOSIS — N281 Cyst of kidney, acquired: Secondary | ICD-10-CM | POA: Diagnosis present

## 2024-01-26 DIAGNOSIS — Z79899 Other long term (current) drug therapy: Secondary | ICD-10-CM

## 2024-01-26 DIAGNOSIS — I5032 Chronic diastolic (congestive) heart failure: Secondary | ICD-10-CM | POA: Diagnosis present

## 2024-01-26 DIAGNOSIS — Z85828 Personal history of other malignant neoplasm of skin: Secondary | ICD-10-CM | POA: Diagnosis not present

## 2024-01-26 DIAGNOSIS — E66813 Obesity, class 3: Secondary | ICD-10-CM | POA: Diagnosis present

## 2024-01-26 DIAGNOSIS — Z950 Presence of cardiac pacemaker: Secondary | ICD-10-CM

## 2024-01-26 DIAGNOSIS — Z833 Family history of diabetes mellitus: Secondary | ICD-10-CM | POA: Diagnosis not present

## 2024-01-26 DIAGNOSIS — G4733 Obstructive sleep apnea (adult) (pediatric): Secondary | ICD-10-CM | POA: Diagnosis present

## 2024-01-26 DIAGNOSIS — N179 Acute kidney failure, unspecified: Secondary | ICD-10-CM | POA: Diagnosis not present

## 2024-01-26 DIAGNOSIS — N2889 Other specified disorders of kidney and ureter: Secondary | ICD-10-CM | POA: Diagnosis not present

## 2024-01-26 DIAGNOSIS — E1159 Type 2 diabetes mellitus with other circulatory complications: Secondary | ICD-10-CM | POA: Diagnosis present

## 2024-01-26 DIAGNOSIS — I442 Atrioventricular block, complete: Secondary | ICD-10-CM

## 2024-01-26 DIAGNOSIS — Z9884 Bariatric surgery status: Secondary | ICD-10-CM | POA: Diagnosis not present

## 2024-01-26 DIAGNOSIS — E876 Hypokalemia: Secondary | ICD-10-CM | POA: Diagnosis not present

## 2024-01-26 DIAGNOSIS — N200 Calculus of kidney: Secondary | ICD-10-CM | POA: Diagnosis present

## 2024-01-26 DIAGNOSIS — N453 Epididymo-orchitis: Secondary | ICD-10-CM | POA: Diagnosis present

## 2024-01-26 DIAGNOSIS — E1169 Type 2 diabetes mellitus with other specified complication: Secondary | ICD-10-CM | POA: Diagnosis present

## 2024-01-26 DIAGNOSIS — N390 Urinary tract infection, site not specified: Secondary | ICD-10-CM | POA: Diagnosis present

## 2024-01-26 DIAGNOSIS — N3000 Acute cystitis without hematuria: Principal | ICD-10-CM | POA: Diagnosis present

## 2024-01-26 DIAGNOSIS — Z9981 Dependence on supplemental oxygen: Secondary | ICD-10-CM | POA: Diagnosis not present

## 2024-01-26 DIAGNOSIS — E785 Hyperlipidemia, unspecified: Secondary | ICD-10-CM | POA: Diagnosis present

## 2024-01-26 DIAGNOSIS — B9620 Unspecified Escherichia coli [E. coli] as the cause of diseases classified elsewhere: Secondary | ICD-10-CM | POA: Diagnosis present

## 2024-01-26 DIAGNOSIS — I10 Essential (primary) hypertension: Secondary | ICD-10-CM | POA: Diagnosis present

## 2024-01-26 DIAGNOSIS — Z87442 Personal history of urinary calculi: Secondary | ICD-10-CM

## 2024-01-26 DIAGNOSIS — Z1611 Resistance to penicillins: Secondary | ICD-10-CM | POA: Diagnosis not present

## 2024-01-26 DIAGNOSIS — I110 Hypertensive heart disease with heart failure: Secondary | ICD-10-CM | POA: Diagnosis not present

## 2024-01-26 DIAGNOSIS — K5730 Diverticulosis of large intestine without perforation or abscess without bleeding: Secondary | ICD-10-CM | POA: Diagnosis not present

## 2024-01-26 DIAGNOSIS — E871 Hypo-osmolality and hyponatremia: Secondary | ICD-10-CM | POA: Diagnosis not present

## 2024-01-26 DIAGNOSIS — E559 Vitamin D deficiency, unspecified: Secondary | ICD-10-CM | POA: Diagnosis present

## 2024-01-26 DIAGNOSIS — Z9049 Acquired absence of other specified parts of digestive tract: Secondary | ICD-10-CM

## 2024-01-26 DIAGNOSIS — Z6841 Body Mass Index (BMI) 40.0 and over, adult: Secondary | ICD-10-CM

## 2024-01-26 DIAGNOSIS — F32A Depression, unspecified: Secondary | ICD-10-CM | POA: Diagnosis present

## 2024-01-26 DIAGNOSIS — A419 Sepsis, unspecified organism: Secondary | ICD-10-CM | POA: Diagnosis not present

## 2024-01-26 DIAGNOSIS — I152 Hypertension secondary to endocrine disorders: Secondary | ICD-10-CM | POA: Diagnosis not present

## 2024-01-26 DIAGNOSIS — R7401 Elevation of levels of liver transaminase levels: Secondary | ICD-10-CM | POA: Diagnosis present

## 2024-01-26 DIAGNOSIS — R350 Frequency of micturition: Secondary | ICD-10-CM | POA: Diagnosis not present

## 2024-01-26 DIAGNOSIS — F419 Anxiety disorder, unspecified: Secondary | ICD-10-CM | POA: Diagnosis present

## 2024-01-26 LAB — URINALYSIS, ROUTINE W REFLEX MICROSCOPIC
Bilirubin Urine: NEGATIVE
Cellular Cast, UA: 10
Glucose, UA: NEGATIVE mg/dL
Hgb urine dipstick: NEGATIVE
Ketones, ur: NEGATIVE mg/dL
Nitrite: NEGATIVE
Protein, ur: 30 mg/dL — AB
Specific Gravity, Urine: 1.014 (ref 1.005–1.030)
WBC, UA: >50 WBC/hpf (ref 0–5)
pH: 6 (ref 5.0–8.0)

## 2024-01-26 LAB — CUP PACEART REMOTE DEVICE CHECK
Battery Remaining Longevity: 88 mo
Battery Remaining Percentage: 79 %
Battery Voltage: 2.99 V
Brady Statistic AP VP Percent: 18 %
Brady Statistic AP VS Percent: 1 %
Brady Statistic AS VP Percent: 77 %
Brady Statistic AS VS Percent: 1.9 %
Brady Statistic RA Percent Paced: 15 %
Brady Statistic RV Percent Paced: 95 %
Date Time Interrogation Session: 20250627020012
Implantable Lead Connection Status: 753985
Implantable Lead Connection Status: 753985
Implantable Lead Implant Date: 20231229
Implantable Lead Implant Date: 20231229
Implantable Lead Location: 753859
Implantable Lead Location: 753860
Implantable Pulse Generator Implant Date: 20231229
Lead Channel Impedance Value: 490 Ohm
Lead Channel Impedance Value: 530 Ohm
Lead Channel Pacing Threshold Amplitude: 0.75 V
Lead Channel Pacing Threshold Amplitude: 1.25 V
Lead Channel Pacing Threshold Pulse Width: 0.4 ms
Lead Channel Pacing Threshold Pulse Width: 0.4 ms
Lead Channel Sensing Intrinsic Amplitude: 12 mV
Lead Channel Sensing Intrinsic Amplitude: 2.8 mV
Lead Channel Setting Pacing Amplitude: 2 V
Lead Channel Setting Pacing Amplitude: 2.5 V
Lead Channel Setting Pacing Pulse Width: 0.4 ms
Lead Channel Setting Sensing Sensitivity: 4 mV
Pulse Gen Model: 2272
Pulse Gen Serial Number: 8139537

## 2024-01-26 LAB — CBC WITH DIFFERENTIAL/PLATELET
Abs Immature Granulocytes: 0.08 10*3/uL — ABNORMAL HIGH (ref 0.00–0.07)
Basophils Absolute: 0 10*3/uL (ref 0.0–0.1)
Basophils Relative: 0 %
Eosinophils Absolute: 0.1 10*3/uL (ref 0.0–0.5)
Eosinophils Relative: 0 %
HCT: 36 % — ABNORMAL LOW (ref 39.0–52.0)
Hemoglobin: 12.4 g/dL — ABNORMAL LOW (ref 13.0–17.0)
Immature Granulocytes: 1 %
Lymphocytes Relative: 8 %
Lymphs Abs: 1.2 10*3/uL (ref 0.7–4.0)
MCH: 30.3 pg (ref 26.0–34.0)
MCHC: 34.4 g/dL (ref 30.0–36.0)
MCV: 88 fL (ref 80.0–100.0)
Monocytes Absolute: 1.3 10*3/uL — ABNORMAL HIGH (ref 0.1–1.0)
Monocytes Relative: 8 %
Neutro Abs: 13.7 10*3/uL — ABNORMAL HIGH (ref 1.7–7.7)
Neutrophils Relative %: 83 %
Platelets: 141 10*3/uL — ABNORMAL LOW (ref 150–400)
RBC: 4.09 MIL/uL — ABNORMAL LOW (ref 4.22–5.81)
RDW: 13.2 % (ref 11.5–15.5)
WBC: 16.5 10*3/uL — ABNORMAL HIGH (ref 4.0–10.5)
nRBC: 0 % (ref 0.0–0.2)

## 2024-01-26 LAB — URINE CULTURE: Culture: >=100000 — AB

## 2024-01-26 LAB — COMPREHENSIVE METABOLIC PANEL WITH GFR
ALT: 109 U/L — ABNORMAL HIGH (ref 0–44)
AST: 108 U/L — ABNORMAL HIGH (ref 15–41)
Albumin: 3 g/dL — ABNORMAL LOW (ref 3.5–5.0)
Alkaline Phosphatase: 183 U/L — ABNORMAL HIGH (ref 38–126)
Anion gap: 11 (ref 5–15)
BUN: 27 mg/dL — ABNORMAL HIGH (ref 8–23)
CO2: 24 mmol/L (ref 22–32)
Calcium: 9 mg/dL (ref 8.9–10.3)
Chloride: 96 mmol/L — ABNORMAL LOW (ref 98–111)
Creatinine, Ser: 1.45 mg/dL — ABNORMAL HIGH (ref 0.61–1.24)
GFR, Estimated: 53 mL/min — ABNORMAL LOW (ref >60)
Glucose, Bld: 134 mg/dL — ABNORMAL HIGH (ref 70–99)
Potassium: 3.5 mmol/L (ref 3.5–5.1)
Sodium: 132 mmol/L — ABNORMAL LOW (ref 135–145)
Total Bilirubin: 0.5 mg/dL (ref 0.0–1.2)
Total Protein: 6.9 g/dL (ref 6.5–8.1)

## 2024-01-26 LAB — LACTIC ACID, PLASMA: Lactic Acid, Venous: 2.1 mmol/L (ref 0.5–1.9)

## 2024-01-26 MED ORDER — SODIUM CHLORIDE 0.9 % IV SOLN
500.0000 mg | Freq: Once | INTRAVENOUS | Status: AC
  Administered 2024-01-27: 500 mg via INTRAVENOUS
  Filled 2024-01-26: qty 5

## 2024-01-26 MED ORDER — SODIUM CHLORIDE 0.9 % IV SOLN
1.0000 g | Freq: Once | INTRAVENOUS | Status: AC
  Administered 2024-01-26: 1 g via INTRAVENOUS
  Filled 2024-01-26: qty 10

## 2024-01-26 MED ORDER — SODIUM CHLORIDE 0.9 % IV BOLUS
1000.0000 mL | Freq: Once | INTRAVENOUS | Status: AC
  Administered 2024-01-26: 1000 mL via INTRAVENOUS

## 2024-01-26 NOTE — ED Triage Notes (Signed)
 Onset Monday of urinary frequency and burning pain.  Seen at urgent care and started on antibiotic for a UTI.   States MD call him due to E. coli in urine recommending he goes to ER for IV antibiotics.  States still having fever at home.  Tylenol  at 1000 am

## 2024-01-26 NOTE — ED Provider Notes (Addendum)
 [place] EMERGENCY DEPARTMENT AT [facility] Provider Note   CSN: [id number] Arrival date & time: 01/26/24  1629     Patient presents with: Urinary Frequency (UTI)   Blake Garcia is a 68 y.o. male.   Patient seen on the 25th had history of some fever some discomfort with urinating.  Urinalysis seem to consistent with urinary tract infection he was started on Macrobid  and urine culture was done.  Urine culture is growing E. coli only resistant to ampicillin  and Unasyn .  Was sensitive to the Macrobid .  Patient really did not get a whole lot better is not significantly worse.  He is not able to push a lot of fluids because he has had gastric bypass surgery.  Patient does not really having any flank pain or CVA pain.  Past medical history significant for hyperlipidemia hypertension history of kidney stones diastolic dysfunction.  Patient did say there is some discomfort a little bit in the left upper quadrant area.  So we will probably get CT renal study done.  Will give IV fluids.  Will give Rocephin.  Do not need to send the urine for culture again.  Patient states she has had fevers today up to 101.       Prior to Admission medications   Medication Sig Start Date End Date Taking? Authorizing Provider  amLODipine  (NORVASC ) 10 MG tablet TAKE 1 TABLET BY MOUTH DAILY 01/24/24   Darron Deatrice LABOR, MD  Calcium Carb-Cholecalciferol (CALCIUM 500 + D PO) Take 3 tablets by mouth every evening. 11/03/20   [provider]  carvedilol  (COREG ) 12.5 MG tablet TAKE 1 TABLET BY MOUTH TWICE  DAILY WITH MEALS 01/24/24   Darron Deatrice LABOR, MD  Multiple Vitamins-Minerals (BARIATRIC MULTIVITAMINS/IRON) CAPS Take 1 capsule by mouth daily.    [provider]  nitrofurantoin , macrocrystal-monohydrate, (MACROBID ) 100 MG capsule Take 1 capsule (100 mg total) by mouth 2 (two) times daily for 7 days. 01/24/24 01/31/24  Teresa Shelba SAUNDERS, NP  potassium chloride  SA (KLOR-CON  M) 20 MEQ tablet  TAKE 1 TABLET BY MOUTH DAILY 01/23/24   Bedsole, Amy E, MD  simvastatin  (ZOCOR ) 20 MG tablet TAKE 1 TABLET BY MOUTH AT  BEDTIME 01/22/24   Copland, Jacques, MD  spironolactone  (ALDACTONE ) 25 MG tablet TAKE 1 TABLET BY MOUTH DAILY 01/24/24   Darron Deatrice LABOR, MD  triamcinolone  cream (KENALOG ) 0.1 % APPLY TO AFFECTED AREAS TWICE A DAY IF NEEDED 06/22/23   Bedsole, Amy E, MD  valsartan  (DIOVAN ) 320 MG tablet Take 1 tablet (320 mg total) by mouth daily. 05/23/23   Gerard Frederick, NP  venlafaxine  XR (EFFEXOR -XR) 75 MG 24 hr capsule TAKE 3 CAPSULES BY MOUTH ONCE DAILY 03/14/23   Avelina Greig BRAVO, MD    Allergies: Patient has no known allergies.    Review of Systems  Constitutional:  Positive for fever. Negative for chills.  HENT:  Negative for ear pain and sore throat.   Eyes:  Negative for pain and visual disturbance.  Respiratory:  Negative for cough and shortness of breath.   Cardiovascular:  Negative for chest pain and palpitations.  Gastrointestinal:  Negative for abdominal pain and vomiting.  Genitourinary:  Positive for dysuria. Negative for hematuria.  Musculoskeletal:  Negative for arthralgias and back pain.  Skin:  Negative for color change and rash.  Neurological:  Negative for seizures and syncope.  All other systems reviewed and are negative.   Updated Vital Signs BP 115/67   Pulse (!) 59   Temp  97.7 F (36.5 C) (Oral)   Resp 18   Ht 1.702 m (5' 7)   Wt 127 kg   SpO2 95%   BMI 43.85 kg/m   Physical Exam Vitals and nursing note reviewed.  Constitutional:      General: He is not in acute distress.    Appearance: Normal appearance. He is well-developed. He is not ill-appearing.  HENT:     Head: Normocephalic and atraumatic.     Mouth/Throat:     Mouth: Mucous membranes are dry.   Eyes:     Conjunctiva/sclera: Conjunctivae normal.    Cardiovascular:     Rate and Rhythm: Normal rate and regular rhythm.     Heart sounds: No murmur heard. Pulmonary:     Effort:  Pulmonary effort is normal. No respiratory distress.     Breath sounds: Normal breath sounds.  Abdominal:     General: There is no distension.     Palpations: Abdomen is soft.     Tenderness: There is no abdominal tenderness. There is no guarding.   Musculoskeletal:        General: No swelling.     Cervical back: Normal range of motion and neck supple.     Right lower leg: Edema present.     Left lower leg: Edema present.   Skin:    General: Skin is warm and dry.     Capillary Refill: Capillary refill takes less than 2 seconds.   Neurological:     General: No focal deficit present.     Mental Status: He is alert and oriented to person, place, and time.   Psychiatric:        Mood and Affect: Mood normal.     (all labs ordered are listed, but only abnormal results are displayed) Labs Reviewed  URINALYSIS, ROUTINE W REFLEX MICROSCOPIC - Abnormal; Notable for the following components:      Result Value   APPearance HAZY (*)    Protein, ur 30 (*)    Leukocytes,Ua LARGE (*)    Bacteria, UA FEW (*)    All other components within normal limits  CBC WITH DIFFERENTIAL/PLATELET - Abnormal; Notable for the following components:   WBC 16.5 (*)    RBC 4.09 (*)    Hemoglobin 12.4 (*)    HCT 36.0 (*)    Platelets 141 (*)    Neutro Abs 13.7 (*)    Monocytes Absolute 1.3 (*)    Abs Immature Granulocytes 0.08 (*)    All other components within normal limits  COMPREHENSIVE METABOLIC PANEL WITH GFR - Abnormal; Notable for the following components:   Sodium 132 (*)    Chloride 96 (*)    Glucose, Bld 134 (*)    BUN 27 (*)    Creatinine, Ser 1.45 (*)    Albumin 3.0 (*)    AST 108 (*)    ALT 109 (*)    Alkaline Phosphatase 183 (*)    GFR, Estimated 53 (*)    All other components within normal limits    EKG: None  Radiology: CUP PACEART REMOTE DEVICE CHECK Result Date: 01/26/2024 PPM scheduled remote reviewed. Normal device function.  Presenting rhythm: AS-VP 1641 AMS events.  All < 1 min.  All c/w FFRW oversensing. Next remote 91 days. AB, CVRS    Procedures   Medications Ordered in the ED - No data to display  Medical Decision Making Amount and/or Complexity of Data Reviewed Labs: ordered. Radiology: ordered.   Urinalysis greater than 50 white blood cells.  Bacteria few.  Nitrite negative.  CBC white count 16.5 hemoglobin 12.4 platelets 141.  Complete metabolic panel GFR 53 creatinine 1.45.  Sodium 132 potassium normal chloride 96 glucose 134.  Liver function test 108109 for AST ALT alk phos 183 total bili normal.  Anion gap normal.  Patient's urine culture growing E. coli resistant to ampicillin  and Unasyn  but sensitive to everything else.  I will give IV Rocephin give some fluids.  Put him on cardiac monitor make sure blood pressure stabilized out.  We did get 1 that was a little low at 96.  Will send blood cultures just in case he gets worse.  And we will check a lactic acid.  His white count was elevated at 16.5.  Patient however is nontoxic in appearance.  And states that history of kidney stones with a little bit of left upper quadrant discomfort we will just make sure that we do not have a stone that is causing hydronephrosis and therefore could be an infected stone.  CT renal scan without evidence of a stone there is a 12 mm nonobstructing right renal calculus.  But no ureter stones.  There is also questionable left basilar atelectasis or infiltrate.  Certainly the Rocephin would help with that.  Can add on treating him with Zithromax as well.  To cover this.  I think patient can be treated with the Keflex  and the Zithromax.  Delayed but patient's lactic acid is 2.1.  Blood cultures have been sent as well.  Clinically I think patient can be treated best as an inpatient.  Received some IV Rocephin here will give some IV Zithromax with his questionable infiltrate in the lower part of the lungs as well.   In summary I  think patient's been on outpatient antibiotics.  He is got elevated white count his lactic acid is up some.  History of fevers at home blood pressures here have been stable.  Cultures have been sent.  Got IV Rocephin we will get some IV azithromycin because of the questionable infiltrate in the left base area.  Oxygen sats are good.  Will contact with the hospitalist again admitted.      Final diagnoses:  Acute cystitis without hematuria    ED Discharge Orders     None          Geraldene Hamilton, MD 01/26/24 2156    Geraldene Hamilton, MD 01/26/24 2251    Geraldene Hamilton, MD 01/26/24 [id number]    Geraldene Hamilton, MD 01/26/24 2330

## 2024-01-26 NOTE — Telephone Encounter (Signed)
 Called patient and spoke with him in detail. He has no tachycardia or lightheadedness but has been unable to push fluids given history of gastric bypass. He continues to have a fever up to 101 despite 5 days of antibiotics.  He is continuing to have urinary tract symptoms. He is a diabetic  We discussed start concerns of pyelonephritis versus less likely sepsis.  I recommended an ED visit for IV fluids and IV antibiotics with possible consideration of admission.  , Patient voiced understanding and plans to go to [facility] ED.

## 2024-01-26 NOTE — Telephone Encounter (Signed)
 FYI Only or Action Required?: Action required by provider: clinical question for provider.  Patient was last seen in primary care on 12/22/2023 by Avelina Greig BRAVO, MD. Called Nurse Triage reporting Fever. Symptoms began started this past Monday. Interventions attempted: Prescription medications: Macrobid  and Rest, hydration, or home remedies. Symptoms are: unchanged.  Triage Disposition: Go to ED or PCP/Alternative with Approval  Patient/caregiver understands and will follow disposition?: No, wishes to speak with PCP  Copied from CRM [id number]. Topic: Clinical - Red Word Triage >> Jan 26, 2024 12:10 PM Gennette ORN wrote: Red Word that prompted transfer to Nurse Triage: Patient has a fever of 104.4 he also has chill and aches. This has been going on since this Monday. The dr he saw at urgent care gave him some antibiotics twice a day and the patient also has been taking Tylenol  as well. Reason for Disposition  Patient sounds very sick or weak to the triager  (Exception: Mild weakness and hasn't taken fever medicine.)  Answer Assessment - Initial Assessment Questions 1. TEMPERATURE: What is the most recent temperature?  How was it measured?      101.4-temporal. Took 1 500mg  Tylenol  at 10 AM.  2. ONSET: When did the fever start?      Started on Monday 3. CHILLS: Do you have chills? If yes: How bad are they?  (e.g., none, mild, moderate, severe)   - NONE: no chills   - MILD: feeling cold   - MODERATE: feeling very cold, some shivering (feels better under a thick blanket)   - SEVERE: feeling extremely cold with shaking chills (general body shaking, rigors; even under a thick blanket)      Moderate 4. OTHER SYMPTOMS: Do you have any other symptoms besides the fever?  (e.g., abdomen pain, cough, diarrhea, earache, headache, sore throat, urination pain)     Generalized body aches, patient reports only urinating small amounts of urine. 5. CAUSE: If there are no symptoms, ask: What do you  think is causing the fever?      Patient was diagnosed with UTI per urine culture from Urgent care visit on Wednesday 6. CONTACTS: Does anyone else in the family have an infection?     no 7. TREATMENT: What have you done so far to treat this fever? (e.g., medications)     Antibiotics and tylenol  8. IMMUNOCOMPROMISE: Do you have of the following: diabetes, HIV positive, splenectomy, cancer chemotherapy, chronic steroid treatment, transplant patient, etc.     Gastric bypass patient-hx of DM prior to surgery 10. TRAVEL: Have you traveled out of the country in the last month? (e.g., travel history, exposures)       no  Patient states he has been having fevers, chills and aches since Monday. Patient was seen at Urgent Care 01/24/2024. Patient was started on macrobid  from Urgent care visit and has taken five doses so far. Discussed with patient the need for an Emergency department visit with still running fevers while on antibiotics. Patient would like to speak with provider  Protocols used: [facility]

## 2024-01-27 ENCOUNTER — Encounter (HOSPITAL_COMMUNITY): Payer: Self-pay | Admitting: Internal Medicine

## 2024-01-27 DIAGNOSIS — B9620 Unspecified Escherichia coli [E. coli] as the cause of diseases classified elsewhere: Secondary | ICD-10-CM | POA: Diagnosis present

## 2024-01-27 DIAGNOSIS — N179 Acute kidney failure, unspecified: Secondary | ICD-10-CM | POA: Diagnosis present

## 2024-01-27 DIAGNOSIS — A419 Sepsis, unspecified organism: Secondary | ICD-10-CM | POA: Diagnosis present

## 2024-01-27 DIAGNOSIS — I5032 Chronic diastolic (congestive) heart failure: Secondary | ICD-10-CM | POA: Diagnosis present

## 2024-01-27 DIAGNOSIS — N390 Urinary tract infection, site not specified: Secondary | ICD-10-CM | POA: Diagnosis present

## 2024-01-27 DIAGNOSIS — N3000 Acute cystitis without hematuria: Secondary | ICD-10-CM | POA: Diagnosis not present

## 2024-01-27 LAB — BLOOD CULTURE ID PANEL (REFLEXED) - BCID2

## 2024-01-27 LAB — COMPREHENSIVE METABOLIC PANEL WITH GFR
ALT: 72 U/L — ABNORMAL HIGH (ref 0–44)
AST: 60 U/L — ABNORMAL HIGH (ref 15–41)
Albumin: 2.4 g/dL — ABNORMAL LOW (ref 3.5–5.0)
Alkaline Phosphatase: 139 U/L — ABNORMAL HIGH (ref 38–126)
Anion gap: 12 (ref 5–15)
BUN: 23 mg/dL (ref 8–23)
CO2: 25 mmol/L (ref 22–32)
Calcium: 8 mg/dL — ABNORMAL LOW (ref 8.9–10.3)
Chloride: 98 mmol/L (ref 98–111)
Creatinine, Ser: 1.04 mg/dL (ref 0.61–1.24)
GFR, Estimated: >60 mL/min (ref >60)
Glucose, Bld: 117 mg/dL — ABNORMAL HIGH (ref 70–99)
Potassium: 3.1 mmol/L — ABNORMAL LOW (ref 3.5–5.1)
Sodium: 135 mmol/L (ref 135–145)
Total Bilirubin: 0.7 mg/dL (ref 0.0–1.2)
Total Protein: 6.1 g/dL — ABNORMAL LOW (ref 6.5–8.1)

## 2024-01-27 LAB — CBC
HCT: 35.8 % — ABNORMAL LOW (ref 39.0–52.0)
Hemoglobin: 12.1 g/dL — ABNORMAL LOW (ref 13.0–17.0)
MCH: 30.3 pg (ref 26.0–34.0)
MCHC: 33.8 g/dL (ref 30.0–36.0)
MCV: 89.7 fL (ref 80.0–100.0)
Platelets: 156 10*3/uL (ref 150–400)
RBC: 3.99 MIL/uL — ABNORMAL LOW (ref 4.22–5.81)
RDW: 13.1 % (ref 11.5–15.5)
WBC: 18.2 10*3/uL — ABNORMAL HIGH (ref 4.0–10.5)
nRBC: 0 % (ref 0.0–0.2)

## 2024-01-27 LAB — HIV ANTIBODY (ROUTINE TESTING W REFLEX): HIV Screen 4th Generation wRfx: NONREACTIVE

## 2024-01-27 LAB — MAGNESIUM: Magnesium: 2.1 mg/dL (ref 1.7–2.4)

## 2024-01-27 LAB — LACTIC ACID, PLASMA: Lactic Acid, Venous: 1.8 mmol/L (ref 0.5–1.9)

## 2024-01-27 LAB — PHOSPHORUS: Phosphorus: 2.6 mg/dL (ref 2.5–4.6)

## 2024-01-27 LAB — GLUCOSE, CAPILLARY
Glucose-Capillary: 114 mg/dL — ABNORMAL HIGH (ref 70–99)
Glucose-Capillary: 122 mg/dL — ABNORMAL HIGH (ref 70–99)
Glucose-Capillary: 116 mg/dL — ABNORMAL HIGH (ref 70–99)

## 2024-01-27 MED ORDER — SPIRONOLACTONE 25 MG PO TABS: 25.0000 mg | ORAL_TABLET | Freq: Every day | ORAL | Status: DC

## 2024-01-27 MED ORDER — SODIUM CHLORIDE 0.9 % IV SOLN
2.0000 g | INTRAVENOUS | Status: DC
  Administered 2024-01-28 – 2024-01-29 (×2): 2 g via INTRAVENOUS
  Filled 2024-01-27 (×2): qty 20

## 2024-01-27 MED ORDER — VENLAFAXINE HCL ER 75 MG PO CP24
225.0000 mg | ORAL_CAPSULE | Freq: Every day | ORAL | Status: DC
  Administered 2024-01-27 – 2024-01-30 (×4): 225 mg via ORAL
  Filled 2024-01-27 (×4): qty 1

## 2024-01-27 MED ORDER — SODIUM CHLORIDE 0.9 % IV BOLUS
1000.0000 mL | Freq: Once | INTRAVENOUS | Status: AC
  Administered 2024-01-27: 1000 mL via INTRAVENOUS

## 2024-01-27 MED ORDER — SODIUM CHLORIDE 0.9% FLUSH: 10.0000 mL | INTRAVENOUS | Status: DC | PRN

## 2024-01-27 MED ORDER — POTASSIUM CHLORIDE CRYS ER 20 MEQ PO TBCR
40.0000 meq | EXTENDED_RELEASE_TABLET | Freq: Four times a day (QID) | ORAL | Status: AC
  Administered 2024-01-27 (×2): 40 meq via ORAL
  Filled 2024-01-27 (×2): qty 2

## 2024-01-27 MED ORDER — ENOXAPARIN SODIUM 40 MG/0.4ML IJ SOSY
40.0000 mg | PREFILLED_SYRINGE | INTRAMUSCULAR | Status: DC
  Administered 2024-01-27 – 2024-01-29 (×3): 40 mg via SUBCUTANEOUS
  Filled 2024-01-27 (×3): qty 0.4

## 2024-01-27 MED ORDER — SODIUM CHLORIDE 0.9 % IV SOLN
1.0000 g | Freq: Once | INTRAVENOUS | Status: AC
  Administered 2024-01-27: 1 g via INTRAVENOUS
  Filled 2024-01-27: qty 10

## 2024-01-27 MED ORDER — ONDANSETRON HCL 4 MG PO TABS: 4.0000 mg | ORAL_TABLET | Freq: Four times a day (QID) | ORAL | Status: DC | PRN

## 2024-01-27 MED ORDER — SODIUM CHLORIDE 0.9 % IV SOLN: 2.0000 g | INTRAVENOUS | Status: DC

## 2024-01-27 MED ORDER — SODIUM CHLORIDE 0.9 % IV BOLUS
500.0000 mL | Freq: Once | INTRAVENOUS | Status: AC
  Administered 2024-01-27: 500 mL via INTRAVENOUS

## 2024-01-27 MED ORDER — CARVEDILOL 12.5 MG PO TABS
12.5000 mg | ORAL_TABLET | Freq: Two times a day (BID) | ORAL | Status: DC
  Administered 2024-01-27 – 2024-01-30 (×6): 12.5 mg via ORAL
  Filled 2024-01-27 (×6): qty 1

## 2024-01-27 MED ORDER — ACETAMINOPHEN 650 MG RE SUPP
650.0000 mg | Freq: Four times a day (QID) | RECTAL | Status: DC | PRN
Start: 2024-01-27 — End: 2024-01-30

## 2024-01-27 MED ORDER — ONDANSETRON HCL 4 MG/2ML IJ SOLN: 4.0000 mg | Freq: Four times a day (QID) | INTRAMUSCULAR | Status: DC | PRN

## 2024-01-27 MED ORDER — ACETAMINOPHEN 325 MG PO TABS
650.0000 mg | ORAL_TABLET | Freq: Four times a day (QID) | ORAL | Status: DC | PRN
Start: 2024-01-27 — End: 2024-01-30

## 2024-01-27 MED ORDER — VENLAFAXINE HCL ER 75 MG PO CP24: 225.0000 mg | ORAL_CAPSULE | Freq: Every day | ORAL | Status: DC

## 2024-01-27 NOTE — ED Notes (Signed)
 I ihave just given report to [employer], Charity fundraiser at [employer]. He leaves with [facility] at this time.

## 2024-01-27 NOTE — ED Notes (Signed)
 [facility] called for transport

## 2024-01-27 NOTE — Progress Notes (Signed)
 PHARMACY - PHYSICIAN COMMUNICATION CRITICAL VALUE ALERT - BLOOD CULTURE IDENTIFICATION (BCID)  Blake Garcia is an 68 y.o. male who presented to [facility] on 01/26/2024 with a chief complaint of urinary frequency  Assessment:  1 of 4 bottles, GNR  = E coli. Source = urine  Name of physician (or Provider) Contacted: Lavanda Horns, NP  Current antibiotics: ceftriaxone 2 gm IV q24 to start 6/29  Changes to prescribed antibiotics recommended:  Give 2nd dose of ceftriaxone 1 gm now to make total dose = 2 grams today then continue ceftriaxone 2 gm IV daily  Results for orders placed or performed during the hospital encounter of 01/26/24  Blood Culture ID Panel (Reflexed) (Collected: 01/26/2024 10:30 PM)  Result Value Ref Range   Enterococcus faecalis NOT DETECTED NOT DETECTED   Enterococcus Faecium NOT DETECTED NOT DETECTED   Listeria monocytogenes NOT DETECTED NOT DETECTED   Staphylococcus species NOT DETECTED NOT DETECTED   Staphylococcus aureus (BCID) NOT DETECTED NOT DETECTED   Staphylococcus epidermidis NOT DETECTED NOT DETECTED   Staphylococcus lugdunensis NOT DETECTED NOT DETECTED   Streptococcus species NOT DETECTED NOT DETECTED   Streptococcus agalactiae NOT DETECTED NOT DETECTED   Streptococcus pneumoniae NOT DETECTED NOT DETECTED   Streptococcus pyogenes NOT DETECTED NOT DETECTED   A.calcoaceticus-baumannii NOT DETECTED NOT DETECTED   Bacteroides fragilis NOT DETECTED NOT DETECTED   Enterobacterales DETECTED (A) NOT DETECTED   Enterobacter cloacae complex NOT DETECTED NOT DETECTED   Escherichia coli DETECTED (A) NOT DETECTED   Klebsiella aerogenes NOT DETECTED NOT DETECTED   Klebsiella oxytoca NOT DETECTED NOT DETECTED   Klebsiella pneumoniae NOT DETECTED NOT DETECTED   Proteus species NOT DETECTED NOT DETECTED   Salmonella species NOT DETECTED NOT DETECTED   Serratia marcescens NOT DETECTED NOT DETECTED   Haemophilus influenzae NOT DETECTED NOT DETECTED   Neisseria  meningitidis NOT DETECTED NOT DETECTED   Pseudomonas aeruginosa NOT DETECTED NOT DETECTED   Stenotrophomonas maltophilia NOT DETECTED NOT DETECTED   Candida albicans NOT DETECTED NOT DETECTED   Candida auris NOT DETECTED NOT DETECTED   Candida glabrata NOT DETECTED NOT DETECTED   Candida krusei NOT DETECTED NOT DETECTED   Candida parapsilosis NOT DETECTED NOT DETECTED   Candida tropicalis NOT DETECTED NOT DETECTED   Cryptococcus neoformans/gattii NOT DETECTED NOT DETECTED   CTX-M ESBL NOT DETECTED NOT DETECTED   Carbapenem resistance IMP NOT DETECTED NOT DETECTED   Carbapenem resistance KPC NOT DETECTED NOT DETECTED   Carbapenem resistance NDM NOT DETECTED NOT DETECTED   Carbapenem resist OXA 48 LIKE NOT DETECTED NOT DETECTED   Carbapenem resistance VIM NOT DETECTED NOT DETECTED    Rosaline IVAR Edison, Pharm.D Use secure chat for questions 01/27/2024 7:35 PM

## 2024-01-27 NOTE — Progress Notes (Signed)

## 2024-01-27 NOTE — ED Notes (Signed)
 He is awake and alert and has just independently ambulated to b.r. and back. He is happy that he is able to normally empty his bladder now.

## 2024-01-27 NOTE — H&P (Signed)
 History and Physical    Patient: Blake Garcia FMW:[id number] DOB: 06-Sep-1955 DOA: 01/26/2024 DOS: the patient was seen and examined on 01/27/2024 PCP: Avelina Greig BRAVO, MD  Patient coming from: Home  Chief Complaint:  Chief Complaint  Patient presents with   Urinary Frequency    UTI   HPI: Blake Garcia is a 68 y.o. male with medical history significant of basal cell carcinoma, RLE cellulitis, depression, diastolic dysfunction, PVCs, history of complete heart block, history of pacemaker placement, vitamin D  deficiency, hyperlipidemia, hypertension, class III obesity, OSA on CPAP, prediabetes, nephrolithiasis who presented to the emergency department with complaints of having urinary frequency, recently diagnosed with UTI with culture growing E. coli.  He was put on cephalexin  twice daily earlier this week after going to urgent care, but his symptoms have persisted.  He called his primary care doctor who referred him to the emergency department.  No flank pain or hematuria. He has subjective fever at home earlier this week. He denied rhinorrhea, sore throat, wheezing or hemoptysis.  No chest pain, palpitations, diaphoresis, PND, orthopnea or pitting edema of the lower extremities.  No abdominal pain, nausea, emesis, diarrhea, constipation, melena or hematochezia.  No polyuria, polydipsia, polyphagia or blurred vision.   Lab work: Urinalysis was hazy with protein of 30 mg/dL.  There was a large leukocyte esterase and a few bacteria.  Greater than 50 WBC, positive WBC clumps.  CBC showed white count 16.5, hemoglobin 12.4 g/dL platelets 858.  Lactic acid is 2.1 then 1.8 mmol/L.  CMP showed a sodium 132, potassium 3.5, chloride 96 and CO2 24 mmol/L with a normal anion gap.  Calcium and bilirubin were normal.  Glucose 134, BUN 27 and creatinine 1.45 mg/dL.  Last month his creatinine level was 0.82 mg/dL.  Total protein 6.9 and albumin 3.0 g/dL.  AST 808, ALT 809 and alkaline phosphatase 183  units/L.  Imaging: CT abdomen/pelvis without contrast showed mild posterior medial left basilar atelectasis and/or infiltrate.  12 mm nonobstructive right renal calculus.  Left renal cyst without follow-up recommending.  Sigmoid diverticulosis and aortic atherosclerosis.   ED course: Initial vital signs were temperature 97.7 F, pulse 62, respiration 18, BP 113/54 mmHg O2 sat 98% on room air.  The patient received ceftriaxone 1 g IVPB, azithromycin 500 mg IVPB and 1000 mL normal saline bolus.  Review of Systems: As mentioned in the history of present illness. All other systems reviewed and are negative.  Past Medical History:  Diagnosis Date   Basal cell carcinoma [id number]   [facility]   Cellulitis 02/09/2012   RLE   Complication of anesthesia ~ 2000   during OR for kidney stones; anesthesia RX made me code twice in one week   Depression    Diastolic dysfunction    a. 11/2014 Echo: EF 50-55%, Gr 1 DD.   History of blood transfusion 1958   I was an Rh baby   History of kidney stones    Hyperlipidemia    Hypertension    Kidney stones    Midsternal chest pain    Morbid obesity (HCC)    On home oxygen therapy    OSA (obstructive sleep apnea)    wear CPAP   Personal history of colonic adenoma 09/14/2012   Pre-diabetes    Past Surgical History:  Procedure Laterality Date   CHOLECYSTECTOMY  1993   COLONOSCOPY     COLONOSCOPY WITH PROPOFOL  N/A 07/28/2020   Procedure: COLONOSCOPY WITH PROPOFOL ;  Surgeon: Avram Lupita BRAVO,  MD;  Location: [facility] END[facility]COPY;  Service: Endoscopy;  Laterality: N/A;   ESOPHAGOGASTRODUODENOSCOPY     KNEE ARTHROSCOPY  ~ 2004   right   LAPAROSCOPY     Surgical sleeve   LITHOTRIPSY  2000   PACEMAKER IMPLANT N/A 07/29/2022   Procedure: PACEMAKER IMPLANT;  Surgeon: Waddell Danelle ORN, MD;  Location: [facility];  Service: Cardiovascular;  Laterality: N/A;   POLYPECTOMY  07/28/2020   Procedure: POLYPECTOMY;  Surgeon: Avram Lupita BRAVO, MD;   Location: [facility]OS[facility];  Service: Endoscopy;;   Social History:  reports that he has never smoked. He has never used smokeless tobacco. He reports that he does not drink alcohol and does not use drugs.  No Known Allergies  Family History  Problem Relation Age of Onset   Hypertension Mother    Heart disease Mother    Heart disease Father    Stroke Father    Heart disease Brother    Coronary artery disease Brother    Diabetes Brother    Cancer Maternal Aunt        breast   Cancer Maternal Uncle        lung   Heart disease Brother    Coronary artery disease Brother    ADD / ADHD Son    ADD / ADHD Son    ADD / ADHD Daughter     Prior to Admission medications   Medication Sig Start Date End Date Taking? Authorizing Provider  amLODipine  (NORVASC ) 10 MG tablet TAKE 1 TABLET BY MOUTH DAILY 01/24/24   Darron Deatrice LABOR, MD  Calcium Carb-Cholecalciferol (CALCIUM 500 + D PO) Take 3 tablets by mouth every evening. 11/03/20   [provider]  carvedilol  (COREG ) 12.5 MG tablet TAKE 1 TABLET BY MOUTH TWICE  DAILY WITH MEALS 01/24/24   Darron Deatrice LABOR, MD  Multiple Vitamins-Minerals (BARIATRIC MULTIVITAMINS/IRON) CAPS Take 1 capsule by mouth daily.    [provider]  nitrofurantoin , macrocrystal-monohydrate, (MACROBID ) 100 MG capsule Take 1 capsule (100 mg total) by mouth 2 (two) times daily for 7 days. 01/24/24 01/31/24  Teresa Shelba SAUNDERS, NP  potassium chloride  SA (KLOR-CON  M) 20 MEQ tablet TAKE 1 TABLET BY MOUTH DAILY 01/23/24   Bedsole, Amy E, MD  simvastatin  (ZOCOR ) 20 MG tablet TAKE 1 TABLET BY MOUTH AT  BEDTIME 01/22/24   Copland, Jacques, MD  spironolactone  (ALDACTONE ) 25 MG tablet TAKE 1 TABLET BY MOUTH DAILY 01/24/24   Darron Deatrice LABOR, MD  triamcinolone  cream (KENALOG ) 0.1 % APPLY TO AFFECTED AREAS TWICE A DAY IF NEEDED 06/22/23   Bedsole, Amy E, MD  valsartan  (DIOVAN ) 320 MG tablet Take 1 tablet (320 mg total) by mouth daily. 05/23/23   Gerard Frederick, NP  venlafaxine  XR  (EFFEXOR -XR) 75 MG 24 hr capsule TAKE 3 CAPSULES BY MOUTH ONCE DAILY 03/14/23   Avelina Greig BRAVO, MD    Physical Exam: Vitals:   01/27/24 0600 01/27/24 0730 01/27/24 0800 01/27/24 1000  BP: 124/71 132/68 132/66 129/78  Pulse: 81 77 74 70  Resp: (!) 27 18 18  (!) 23  Temp:   99.2 F (37.3 C)   TempSrc:   Oral   SpO2: 93% 94% 94% 95%  Weight:      Height:       Physical Exam Vitals and nursing note reviewed.  Constitutional:      General: He is awake. He is not in acute distress.    Appearance: Normal appearance. He is morbidly obese. He is ill-appearing.  HENT:     Head: Normocephalic.     Nose: No rhinorrhea.     Mouth/Throat:     Mouth: Mucous membranes are dry.   Eyes:     General: No scleral icterus.    Pupils: Pupils are equal, round, and reactive to light.   Neck:     Vascular: No JVD.   Cardiovascular:     Rate and Rhythm: Normal rate and regular rhythm.     Heart sounds: S1 normal and S2 normal.  Pulmonary:     Effort: Pulmonary effort is normal.     Breath sounds: Normal breath sounds.  Abdominal:     General: Abdomen is protuberant. There is no distension.     Palpations: Abdomen is soft.     Tenderness: There is no abdominal tenderness. There is no right CVA tenderness or left CVA tenderness.   Musculoskeletal:     Cervical back: Neck supple.     Right lower leg: No edema.     Left lower leg: No edema.   Skin:    General: Skin is warm and dry.   Neurological:     General: No focal deficit present.     Mental Status: He is alert and oriented to person, place, and time.   Psychiatric:        Mood and Affect: Mood normal.        Behavior: Behavior normal. Behavior is cooperative.     Data Reviewed:  Results are pending, will review when available. 07/29/2022 echocardiogram results. IMPRESSIONS:   1. Left ventricular ejection fraction, by estimation, is 60 to 65%. The  left ventricle has normal function. The left ventricle has no regional  wall  motion abnormalities. Left ventricular diastolic function could not  be evaluated.   2. Right ventricular systolic function is normal. The right ventricular  size is normal. There is mildly elevated pulmonary artery systolic  pressure.   3. Small pericardial effusion unchanged from prior to device  implantation.   4. The mitral valve is normal in structure. No evidence of mitral valve  regurgitation. No evidence of mitral stenosis.   5. The aortic valve is tricuspid. Aortic valve regurgitation is not  visualized. No aortic stenosis is present.   6. The inferior vena cava is normal in size with greater than 50%  respiratory variability, suggesting right atrial pressure of 3 mmHg.   Assessment and Plan: Principal Problem:   Sepsis (HCC) In the setting of:   UTI (urinary tract infection) Admit to telemetry/inpatient. Continue IV fluids. Continue ceftriaxone 2 g IVPB every 24 hours. Urine culture on the results review showing E. coli. Follow-up blood culture and sensitivity. Follow CBC and CMP in a.m.  Active Problems:   AKI (acute kidney injury) (HCC) Received only 1 L last night. Will give normal saline bolus. Hold spironolactone  and losartan. Avoid hypotension. Avoid nephrotoxins. Monitor intake and output. Monitor renal function electrolytes.    Hypokalemia Replacing. Follow-up potassium level in the morning.    Chronic diastolic heart failure (HCC) No signs of decompensation. Holding diuretics and ARB.  Hypertension associated with diabetes (HCC) Blood pressures have been soft or normal. Continue carvedilol  12.5 mg p.o. twice daily. Will also hold amlodipine  for now.    Obstructive sleep apnea CPAP at nighttime.    Hyperlipidemia associated with type 2 diabetes mellitus (HCC) Hold statin due to abnormal LFTs.    Obesity, Class III, BMI 40-49.9 (morbid obesity) Current BMI 43.85 kg/m. Status post bariatric surgery. Continue lifestyle  modifications.     Advance Care Planning:   Code Status: Full Code   Consults:   Family Communication:   Severity of Illness: The appropriate patient status for this patient is OBSERVATION. Observation status is judged to be reasonable and necessary in order to provide the required intensity of service to ensure the patient's safety. The patient's presenting symptoms, physical exam findings, and initial radiographic and laboratory data in the context of their medical condition is felt to place them at decreased risk for further clinical deterioration. Furthermore, it is anticipated that the patient will be medically stable for discharge from the hospital within 2 midnights of admission.   Author: Alm Dorn Castor, MD 01/27/2024 12:32 PM  For on call review [email or web address].   This document was prepared using Dragon voice recognition software and may contain some unintended transcription errors.

## 2024-01-28 ENCOUNTER — Inpatient Hospital Stay (HOSPITAL_COMMUNITY)

## 2024-01-28 DIAGNOSIS — Z950 Presence of cardiac pacemaker: Secondary | ICD-10-CM | POA: Diagnosis not present

## 2024-01-28 DIAGNOSIS — Z833 Family history of diabetes mellitus: Secondary | ICD-10-CM | POA: Diagnosis not present

## 2024-01-28 DIAGNOSIS — N3000 Acute cystitis without hematuria: Secondary | ICD-10-CM

## 2024-01-28 DIAGNOSIS — B9620 Unspecified Escherichia coli [E. coli] as the cause of diseases classified elsewhere: Secondary | ICD-10-CM | POA: Diagnosis present

## 2024-01-28 DIAGNOSIS — Z1611 Resistance to penicillins: Secondary | ICD-10-CM | POA: Diagnosis present

## 2024-01-28 DIAGNOSIS — E871 Hypo-osmolality and hyponatremia: Secondary | ICD-10-CM | POA: Diagnosis present

## 2024-01-28 DIAGNOSIS — I5032 Chronic diastolic (congestive) heart failure: Secondary | ICD-10-CM | POA: Diagnosis present

## 2024-01-28 DIAGNOSIS — Z9884 Bariatric surgery status: Secondary | ICD-10-CM | POA: Diagnosis not present

## 2024-01-28 DIAGNOSIS — E66813 Obesity, class 3: Secondary | ICD-10-CM | POA: Diagnosis present

## 2024-01-28 DIAGNOSIS — E1169 Type 2 diabetes mellitus with other specified complication: Secondary | ICD-10-CM | POA: Diagnosis present

## 2024-01-28 DIAGNOSIS — Z9981 Dependence on supplemental oxygen: Secondary | ICD-10-CM | POA: Diagnosis not present

## 2024-01-28 DIAGNOSIS — A419 Sepsis, unspecified organism: Secondary | ICD-10-CM | POA: Diagnosis present

## 2024-01-28 DIAGNOSIS — E876 Hypokalemia: Secondary | ICD-10-CM | POA: Diagnosis present

## 2024-01-28 DIAGNOSIS — Z85828 Personal history of other malignant neoplasm of skin: Secondary | ICD-10-CM | POA: Diagnosis not present

## 2024-01-28 DIAGNOSIS — Z6841 Body Mass Index (BMI) 40.0 and over, adult: Secondary | ICD-10-CM | POA: Diagnosis not present

## 2024-01-28 DIAGNOSIS — F32A Depression, unspecified: Secondary | ICD-10-CM | POA: Diagnosis present

## 2024-01-28 DIAGNOSIS — I442 Atrioventricular block, complete: Secondary | ICD-10-CM | POA: Diagnosis present

## 2024-01-28 DIAGNOSIS — R350 Frequency of micturition: Secondary | ICD-10-CM | POA: Diagnosis present

## 2024-01-28 DIAGNOSIS — E1159 Type 2 diabetes mellitus with other circulatory complications: Secondary | ICD-10-CM | POA: Diagnosis present

## 2024-01-28 DIAGNOSIS — Z8249 Family history of ischemic heart disease and other diseases of the circulatory system: Secondary | ICD-10-CM | POA: Diagnosis not present

## 2024-01-28 DIAGNOSIS — N453 Epididymo-orchitis: Secondary | ICD-10-CM | POA: Diagnosis not present

## 2024-01-28 DIAGNOSIS — I110 Hypertensive heart disease with heart failure: Secondary | ICD-10-CM | POA: Diagnosis present

## 2024-01-28 DIAGNOSIS — Z79899 Other long term (current) drug therapy: Secondary | ICD-10-CM | POA: Diagnosis not present

## 2024-01-28 DIAGNOSIS — I152 Hypertension secondary to endocrine disorders: Secondary | ICD-10-CM | POA: Diagnosis present

## 2024-01-28 DIAGNOSIS — E785 Hyperlipidemia, unspecified: Secondary | ICD-10-CM | POA: Diagnosis present

## 2024-01-28 DIAGNOSIS — N179 Acute kidney failure, unspecified: Secondary | ICD-10-CM | POA: Diagnosis present

## 2024-01-28 LAB — CBC
HCT: 36.4 % — ABNORMAL LOW (ref 39.0–52.0)
Hemoglobin: 12.3 g/dL — ABNORMAL LOW (ref 13.0–17.0)
MCH: 30.5 pg (ref 26.0–34.0)
MCHC: 33.8 g/dL (ref 30.0–36.0)
MCV: 90.3 fL (ref 80.0–100.0)
Platelets: 143 10*3/uL — ABNORMAL LOW (ref 150–400)
RBC: 4.03 MIL/uL — ABNORMAL LOW (ref 4.22–5.81)
RDW: 13 % (ref 11.5–15.5)
WBC: 12.6 10*3/uL — ABNORMAL HIGH (ref 4.0–10.5)
nRBC: 0 % (ref 0.0–0.2)

## 2024-01-28 LAB — GLUCOSE, CAPILLARY
Glucose-Capillary: 103 mg/dL — ABNORMAL HIGH (ref 70–99)
Glucose-Capillary: 104 mg/dL — ABNORMAL HIGH (ref 70–99)
Glucose-Capillary: 154 mg/dL — ABNORMAL HIGH (ref 70–99)
Glucose-Capillary: 92 mg/dL (ref 70–99)

## 2024-01-28 LAB — COMPREHENSIVE METABOLIC PANEL WITH GFR
ALT: 61 U/L — ABNORMAL HIGH (ref 0–44)
AST: 45 U/L — ABNORMAL HIGH (ref 15–41)
Albumin: 2.4 g/dL — ABNORMAL LOW (ref 3.5–5.0)
Alkaline Phosphatase: 129 U/L — ABNORMAL HIGH (ref 38–126)
Anion gap: 11 (ref 5–15)
BUN: 21 mg/dL (ref 8–23)
CO2: 23 mmol/L (ref 22–32)
Calcium: 7.9 mg/dL — ABNORMAL LOW (ref 8.9–10.3)
Chloride: 100 mmol/L (ref 98–111)
Creatinine, Ser: 1.09 mg/dL (ref 0.61–1.24)
GFR, Estimated: >60 mL/min (ref >60)
Glucose, Bld: 114 mg/dL — ABNORMAL HIGH (ref 70–99)
Potassium: 3 mmol/L — ABNORMAL LOW (ref 3.5–5.1)
Sodium: 134 mmol/L — ABNORMAL LOW (ref 135–145)
Total Bilirubin: 0.7 mg/dL (ref 0.0–1.2)
Total Protein: 6.2 g/dL — ABNORMAL LOW (ref 6.5–8.1)

## 2024-01-28 MED ORDER — POTASSIUM CHLORIDE CRYS ER 20 MEQ PO TBCR
40.0000 meq | EXTENDED_RELEASE_TABLET | ORAL | Status: AC
  Administered 2024-01-28 (×2): 40 meq via ORAL
  Filled 2024-01-28 (×2): qty 2

## 2024-01-28 NOTE — Progress Notes (Signed)
 PROGRESS NOTE  Blake Garcia  DOB: 18-Dec-1955  PCP: Avelina Greig BRAVO, MD FMW:[id number]  DOA: 01/26/2024  LOS: 0 days  Hospital Day: 3  Brief narrative: Blake Garcia is a 68 y.o. male with PMH significant for morbid obesity, h/o gastric bypass, OSA on CPAP HTN, HLD, CHB s/p PPM, diastolic dysfunction,  6/27, patient presented to the ED with persistent UTI symptoms despite antibiotics.   2 days prior on 6/25, patient was seen at [facility] urgent care for dysuria, cloudy foul-smelling urine for 3 days.  Urinalysis showed leukocytes, nitrites.  Urine culture was sent and patient was discharged on Macrobid . 6/27, he received a call from PCP to convey growth of E. coli in urine..  Patient reported persistent fever, dysuria.  With the concern of sepsis, patient was directed to ED.  In the ED, patient was afebrile, heart rate in 70s, blood pressure 130s.  Breathing on room air Initial labs with WBC count of 16.5, hemoglobin 12.4, lactic acid 2.1, sodium 132, BUN/creatinine 27/1.45, AST/ALT/ALK was elevated, bilirubin normal CT abdomen/pelvis without contrast showed mild posterior medial left basilar atelectasis and/or infiltrate.  12 mm nonobstructive right renal calculus.  Left renal cyst without follow-up recommending.   Blood culture was sent Patient was started on IV fluid, IV antibiotics Admitted to [facility]   Subjective: Patient was seen and examined this morning. Pleasant elderly Caucasian male.  Sitting up in recliner.  Not in distress.  Feels better than at presentation.  Also has some burning urine. Also mentioned that this morning for the first time, he noted his right testicle is swollen compared to left, nontender.  I confirmed it on physical exam. Chart reviewed No fever in the last 24 hours, stable, breathing on room air Most recent labs from this morning with WC count 12.6, hemoglobin 12.3, potassium low at 3, renal function normal  Assessment and plan: Sepsis POA E. coli UTI   Presented with UTI symptoms not responding to outpatient Macrobid  Urine culture from 6/25 showed E. coli sensitive to Rocephin, Ancef  Currently on IV Rocephin BCID on blood cultures sent this time on 6/27 reviewed showing Enterobacterales, E. Coli Pending culture report No fever.  WBC count improving.  Lactic acid level normalized. Continue to monitor Recent Labs  Lab 01/26/24 1903 01/26/24 2231 01/27/24 0042 01/27/24 1250 01/28/24 0451  WBC 16.5*  --   --  18.2* 12.6*  LATICACIDVEN  --  2.1* 1.8  --   --    AKI Creatinine normal at baseline.  Presented with creatinine elevated to 1.45. Improved with IV fluid Aldactone  and valsartan  on hold Continue to monitor Recent Labs    03/30/23 0923 06/15/23 0733 12/15/23 0733 01/26/24 1903 01/27/24 1250 01/28/24 0451  BUN 11 16 13  27* 23 21  CREATININE 0.82 0.92 0.82 1.45* 1.04 1.09   Hypokalemia Potassium low at 3 today.  Replacement ordered Recent Labs  Lab 01/26/24 1903 01/27/24 1250 01/28/24 0451  K 3.5 3.1* 3.0*  MG  --  2.1  --   PHOS  --  2.6  --    Transaminitis LFTs elevated Probably related to infection. AST, ALT, alk phos all gradually improving. Obtain acute hepatitis panel Recent Labs  Lab 01/26/24 1903 01/27/24 1250 01/28/24 0451  AST 108* 60* 45*  ALT 109* 72* 61*  ALKPHOS 183* 139* 129*  BILITOT 0.5 0.7 0.7  PROT 6.9 6.1* 6.2*  ALBUMIN 3.0* 2.4* 2.4*  PLT 141* 156 143*      Latest Ref Rng &  Units 09/01/2015    8:32 AM  Hepatitis  Hep C Ab NEGATIVE NEGATIVE    Right testicle swelling this morning for the first time, he noted his right testicle is swollen compared to left, nontender.  I confirmed it on physical exam. Obtain ultrasound testes  Chronic diastolic heart failure Hypertension No signs of CHF decompensation. PTA meds- Coreg  12.5 mg twice daily, amlodipine  10 mg daily, valsartan  320 mg daily, Aldactone  25 mg daily Currently on Coreg  12.5 mg twice daily.  Others on hold Blood  pressure in normal range.  Continue to monitor  CHB S/p PPM  HLD Statin on hold due to abnormal LFTs  Morbid Obesity  S/p bariatric surgery Body mass index is 43.85 kg/m. Patient has been advised to make an attempt to improve diet and exercise patterns to aid in weight loss. Patient states he has lost more than 200 pounds since bariatric surgery, gotten rid of oxygen dependence and diabetes meds.  OSA Nightly CPAP  Anxiety/depression Continue Effexor    Mobility: Independent at baseline.  Encourage ambulation  Goals of care   Code Status: Full Code     DVT prophylaxis:  enoxaparin  (LOVENOX ) injection 40 mg Start: 01/27/24 2200   Antimicrobials: IV Rocephin Fluid: None Consultants: None Family Communication: None at bedside  Status: Observation Level of care:  Telemetry   Patient is from: Home Needs to continue in-hospital care: Needs antibiotics, further workup Anticipated d/c to: Pending clinical course   Diet: Ordered Diet Order             Diet heart healthy/carb modified Fluid consistency: Thin  Diet effective now                   Scheduled Meds:  carvedilol   12.5 mg Oral BID WC   enoxaparin  (LOVENOX ) injection  40 mg Subcutaneous Q24H   potassium chloride   40 mEq Oral Q2H   venlafaxine  XR  225 mg Oral Q breakfast    PRN meds: acetaminophen  **OR** acetaminophen , ondansetron  **OR** ondansetron  (ZOFRAN ) IV, sodium chloride  flush   Infusions:   cefTRIAXone (ROCEPHIN)  IV 2 g (01/28/24 1011)    Antimicrobials: Anti-infectives (From admission, onward)    Start     Dose/Rate Route Frequency Ordered Stop   01/28/24 1000  cefTRIAXone (ROCEPHIN) 2 g in sodium chloride  0.9 % 100 mL IVPB  Status:  Discontinued        2 g 200 mL/hr over 30 Minutes Intravenous Every 24 hours 01/27/24 1231 01/27/24 1937   01/28/24 1000  cefTRIAXone (ROCEPHIN) 2 g in sodium chloride  0.9 % 100 mL IVPB        2 g 200 mL/hr over 30 Minutes Intravenous Every 24 hours  01/27/24 1937     01/27/24 2030  cefTRIAXone (ROCEPHIN) 1 g in sodium chloride  0.9 % 100 mL IVPB        1 g 200 mL/hr over 30 Minutes Intravenous  Once 01/27/24 1931 01/27/24 2056   01/27/24 1330  cefTRIAXone (ROCEPHIN) 1 g in sodium chloride  0.9 % 100 mL IVPB        1 g 200 mL/hr over 30 Minutes Intravenous  Once 01/27/24 1231 01/27/24 2024   01/26/24 2345  cefTRIAXone (ROCEPHIN) 1 g in sodium chloride  0.9 % 100 mL IVPB        1 g 200 mL/hr over 30 Minutes Intravenous  Once 01/26/24 2334 01/27/24 0032   01/26/24 2330  azithromycin (ZITHROMAX) 500 mg in sodium chloride  0.9 % 250 mL IVPB  500 mg 250 mL/hr over 60 Minutes Intravenous  Once 01/26/24 2318 01/27/24 0145       Objective: Vitals:   01/28/24 0100 01/28/24 0800  BP: 124/67 132/77  Pulse: 68 73  Resp: 18   Temp: 98.4 F (36.9 C) 99 F (37.2 C)  SpO2: 96% 95%    Intake/Output Summary (Last 24 hours) at 01/28/2024 1022 Last data filed at 01/28/2024 1011 Gross per 24 hour  Intake 1474.99 ml  Output 1275 ml  Net 199.99 ml   Filed Weights   01/26/24 1642  Weight: 127 kg   Weight change:  Body mass index is 43.85 kg/m.   Physical Exam: General exam: Pleasant, elderly Caucasian male.  Not in physical distress Skin: No rashes, lesions or ulcers. HEENT: Atraumatic, normocephalic, no obvious bleeding Lungs: Clear to auscultation bilaterally,  CVS: S1, S2, no murmur,   GI/Abd: Soft, nontender, nondistended, bowel sound present,.  Testicular exam showed right testicle swollen compared to left, nontender CNS: Alert, awake, oriented x 3 Psychiatry: Mood appropriate,  Extremities: No pedal edema, no calf tenderness,   Data Review: I have personally reviewed the laboratory data and studies available.  F/u labs ordered Unresulted Labs (From admission, onward)     Start     Ordered   01/29/24 0500  Hepatitis panel, acute  Tomorrow morning,   R       Question:  Specimen collection method  Answer:  IV Team=IV Team  collect   01/28/24 1019   01/29/24 0500  CBC with Differential/Platelet  Tomorrow morning,   R       Question:  Specimen collection method  Answer:  IV Team=IV Team collect   01/28/24 1020   01/29/24 0500  Comprehensive metabolic panel with GFR  Tomorrow morning,   R       Question:  Specimen collection method  Answer:  IV Team=IV Team collect   01/28/24 1020            Signed, Chapman Rota, MD [facility] 01/28/2024

## 2024-01-28 NOTE — Progress Notes (Signed)
   01/28/24 0001  BiPAP/CPAP/SIPAP  $ Non-Invasive Home Ventilator  Initial  $ Face Mask Large  Yes  BiPAP/CPAP/SIPAP Pt Type Adult  BiPAP/CPAP/SIPAP Resmed  Mask Type Full face mask  Dentures removed? Not applicable  Mask Size Large  Respiratory Rate 20 breaths/min  PEEP 10 cmH20  FiO2 (%) 21 %  Patient Home Machine No  Patient Home Mask No  Patient Home Tubing No  Auto Titrate No  Nasal massage performed Yes  CPAP/SIPAP surface wiped down Yes  Device Plugged into RED Power Outlet Yes  BiPAP/CPAP /SiPAP Vitals  Resp 20  SpO2 93 %  MEWS Score/Color  MEWS Score 0  MEWS Score Color Green

## 2024-01-28 NOTE — Progress Notes (Signed)
   01/28/24 1643  TOC Brief Assessment  Insurance and Status Reviewed  Patient has primary care physician Yes  Home environment has been reviewed single family home  Prior level of function: independent  Prior/Current Home Services No current home services  Social Drivers of Health Review SDOH reviewed no interventions necessary  Readmission risk has been reviewed Yes  Transition of care needs transition of care needs identified, TOC will continue to follow    Heather Saltness, MSW, LCSW 01/28/2024 4:43 PM

## 2024-01-28 NOTE — Care Management Obs Status (Signed)
 MEDICARE OBSERVATION STATUS NOTIFICATION   Patient Details  Name: Blake Garcia MRN: [id number] Date of Birth: November 20, 1955   Medicare Observation Status Notification Given: Yes    Mila Pair A Reah Justo, LCSW 01/28/2024, 10:11 AM

## 2024-01-29 ENCOUNTER — Ambulatory Visit: Payer: Self-pay | Admitting: Internal Medicine

## 2024-01-29 DIAGNOSIS — N3000 Acute cystitis without hematuria: Secondary | ICD-10-CM | POA: Diagnosis not present

## 2024-01-29 LAB — COMPREHENSIVE METABOLIC PANEL WITH GFR
ALT: 54 U/L — ABNORMAL HIGH (ref 0–44)
AST: 35 U/L (ref 15–41)
Albumin: 2.5 g/dL — ABNORMAL LOW (ref 3.5–5.0)
Alkaline Phosphatase: 119 U/L (ref 38–126)
Anion gap: 9 (ref 5–15)
BUN: 16 mg/dL (ref 8–23)
CO2: 22 mmol/L (ref 22–32)
Calcium: 8 mg/dL — ABNORMAL LOW (ref 8.9–10.3)
Chloride: 104 mmol/L (ref 98–111)
Creatinine, Ser: 0.34 mg/dL — ABNORMAL LOW (ref 0.61–1.24)
GFR, Estimated: >60 mL/min (ref >60)
Glucose, Bld: 131 mg/dL — ABNORMAL HIGH (ref 70–99)
Potassium: 3.5 mmol/L (ref 3.5–5.1)
Sodium: 135 mmol/L (ref 135–145)
Total Bilirubin: 0.4 mg/dL (ref 0.0–1.2)
Total Protein: 6.3 g/dL — ABNORMAL LOW (ref 6.5–8.1)

## 2024-01-29 LAB — CULTURE, BLOOD (ROUTINE X 2): Special Requests: ADEQUATE

## 2024-01-29 LAB — CBC WITH DIFFERENTIAL/PLATELET
Abs Immature Granulocytes: 0.08 10*3/uL — ABNORMAL HIGH (ref 0.00–0.07)
Basophils Absolute: 0 10*3/uL (ref 0.0–0.1)
Basophils Relative: 0 %
Eosinophils Absolute: 0.1 10*3/uL (ref 0.0–0.5)
Eosinophils Relative: 2 %
HCT: 36.3 % — ABNORMAL LOW (ref 39.0–52.0)
Hemoglobin: 12.1 g/dL — ABNORMAL LOW (ref 13.0–17.0)
Immature Granulocytes: 1 %
Lymphocytes Relative: 20 %
Lymphs Abs: 1.6 10*3/uL (ref 0.7–4.0)
MCH: 30.5 pg (ref 26.0–34.0)
MCHC: 33.3 g/dL (ref 30.0–36.0)
MCV: 91.4 fL (ref 80.0–100.0)
Monocytes Absolute: 0.9 10*3/uL (ref 0.1–1.0)
Monocytes Relative: 10 %
Neutro Abs: 5.5 10*3/uL (ref 1.7–7.7)
Neutrophils Relative %: 67 %
Platelets: 154 10*3/uL (ref 150–400)
RBC: 3.97 MIL/uL — ABNORMAL LOW (ref 4.22–5.81)
RDW: 13.2 % (ref 11.5–15.5)
WBC: 8.2 10*3/uL (ref 4.0–10.5)
nRBC: 0 % (ref 0.0–0.2)

## 2024-01-29 LAB — GLUCOSE, CAPILLARY
Glucose-Capillary: 107 mg/dL — ABNORMAL HIGH (ref 70–99)
Glucose-Capillary: 96 mg/dL (ref 70–99)
Glucose-Capillary: 119 mg/dL — ABNORMAL HIGH (ref 70–99)
Glucose-Capillary: 91 mg/dL (ref 70–99)
Glucose-Capillary: 131 mg/dL — ABNORMAL HIGH (ref 70–99)

## 2024-01-29 LAB — HEPATITIS PANEL, ACUTE
HCV Ab: NONREACTIVE
Hep A IgM: NONREACTIVE
Hep B C IgM: NONREACTIVE
Hepatitis B Surface Ag: NONREACTIVE

## 2024-01-29 MED ORDER — LEVOFLOXACIN 500 MG PO TABS: 500.0000 mg | ORAL_TABLET | Freq: Every day | ORAL | Status: DC

## 2024-01-29 MED ORDER — RISAQUAD PO CAPS
2.0000 | ORAL_CAPSULE | Freq: Three times a day (TID) | ORAL | Status: DC
  Administered 2024-01-29 – 2024-01-30 (×3): 2 via ORAL
  Filled 2024-01-29 (×3): qty 2

## 2024-01-29 MED ORDER — LEVOFLOXACIN 750 MG PO TABS
750.0000 mg | ORAL_TABLET | Freq: Every day | ORAL | Status: DC
  Administered 2024-01-29 – 2024-01-30 (×2): 750 mg via ORAL
  Filled 2024-01-29 (×2): qty 1

## 2024-01-29 NOTE — Progress Notes (Signed)
   01/29/24 2222  BiPAP/CPAP/SIPAP  $ Non-Invasive Ventilator  Non-Invasive Vent Subsequent  BiPAP/CPAP/SIPAP Pt Type Adult  BiPAP/CPAP/SIPAP Resmed  Mask Type Full face mask  Dentures removed? Not applicable  Mask Size Large  Respiratory Rate 20 breaths/min  EPAP 10 cmH2O  FiO2 (%) 21 %  Patient Home Machine No  Patient Home Mask Yes  Patient Home Tubing No  Auto Titrate No  Device Plugged into RED Power Outlet Yes  BiPAP/CPAP /SiPAP Vitals  Pulse Rate 61  Resp 18  SpO2 99 %  MEWS Score/Color  MEWS Score 0  MEWS Score Color Landy

## 2024-01-29 NOTE — Progress Notes (Addendum)
 PROGRESS NOTE  Blake Garcia  DOB: Dec 22, 1955  PCP: Avelina Greig BRAVO, MD FMW:[id number]  DOA: 01/26/2024  LOS: 1 day  Hospital Day: 4  Brief narrative: Blake Garcia is a 68 y.o. male with PMH significant for morbid obesity, h/o gastric bypass, OSA on CPAP HTN, HLD, CHB s/p PPM, diastolic dysfunction,  6/27, patient presented to the ED with persistent UTI symptoms despite antibiotics.   2 days prior on 6/25, patient was seen at [facility] urgent care for dysuria, cloudy foul-smelling urine for 3 days.  Urinalysis showed leukocytes, nitrites.  Urine culture was sent and patient was discharged on Macrobid . 6/27, he received a call from PCP to convey growth of E. coli in urine..  Patient reported persistent fever, dysuria.  With the concern of sepsis, patient was directed to ED.  In the ED, patient was afebrile, heart rate in 70s, blood pressure 130s.  Breathing on room air Initial labs with WBC count of 16.5, hemoglobin 12.4, lactic acid 2.1, sodium 132, BUN/creatinine 27/1.45, AST/ALT/ALK was elevated, bilirubin normal CT abdomen/pelvis without contrast showed mild posterior medial left basilar atelectasis and/or infiltrate.  12 mm nonobstructive right renal calculus.  Left renal cyst without follow-up recommending.   Blood culture was sent Patient was started on IV fluid, IV antibiotics Admitted to [facility]   Subjective: Patient was seen and examined this morning. Sitting up in recliner.  Feels much better.  Burning urination improving. Blood culture is growing E. coli, same as the urine culture  Assessment and plan: Sepsis POA E. coli UTI  Right epididymoorchitis Presented with UTI symptoms not responding to outpatient Macrobid  Urine culture from 6/25 showed E. coli sensitive to Rocephin, Ancef  Blood culture sent on 6/27 is also growing E. coli. Ultrasound scrotum on 6/28 showed right epididymoorchitis Currently improving on IV Rocephin No fever.  WBC count improving.  Lactic acid  level normalized. Per 'OpenEvidence' guideline, switch patient to oral Levaquin 750 mg daily for next 10 days.  Also obtain GC probe. If continues to improve and remained stable, plan to discharge home tomorrow Recent Labs  Lab 01/26/24 1903 01/26/24 2231 01/27/24 0042 01/27/24 1250 01/28/24 0451 01/29/24 0414  WBC 16.5*  --   --  18.2* 12.6* 8.2  LATICACIDVEN  --  2.1* 1.8  --   --   --    AKI Creatinine normal at baseline.  Presented with creatinine elevated to 1.45. Improved with IV fluid Aldactone  and valsartan  on hold Continue to monitor Recent Labs    03/30/23 0923 06/15/23 0733 12/15/23 0733 01/26/24 1903 01/27/24 1250 01/28/24 0451 01/29/24 0901  BUN 11 16 13  27* 23 21 16   CREATININE 0.82 0.92 0.82 1.45* 1.04 1.09 0.34*   Hypokalemia Potassium level improved with replacement. Recent Labs  Lab 01/26/24 1903 01/27/24 1250 01/28/24 0451 01/29/24 0901  K 3.5 3.1* 3.0* 3.5  MG  --  2.1  --   --   PHOS  --  2.6  --   --    Transaminitis LFTs elevated Probably related to infection. AST, ALT, alk phos all gradually improving. Acute hepatitis panel unremarkable. Recent Labs  Lab 01/26/24 1903 01/27/24 1250 01/28/24 0451 01/29/24 0414 01/29/24 0901  AST 108* 60* 45*  --  35  ALT 109* 72* 61*  --  54*  ALKPHOS 183* 139* 129*  --  119  BILITOT 0.5 0.7 0.7  --  0.4  PROT 6.9 6.1* 6.2*  --  6.3*  ALBUMIN 3.0* 2.4* 2.4*  --  2.5*  PLT 141* 156 143* 154  --    Chronic diastolic heart failure Hypertension No signs of CHF decompensation. PTA meds- Coreg  12.5 mg twice daily, amlodipine  10 mg daily, valsartan  320 mg daily, Aldactone  25 mg daily Currently on Coreg  12.5 mg twice daily.  Valsartan  and Aldactone  remain on hold. Blood pressure in normal range.  Continue to monitor  CHB S/p PPM  HLD Statin on hold due to abnormal LFTs.  Plan to resume postdischarge.  Morbid Obesity  S/p bariatric surgery Body mass index is 43.85 kg/m. Patient has been  advised to make an attempt to improve diet and exercise patterns to aid in weight loss. Patient states he has lost more than 200 pounds since bariatric surgery, gotten rid of oxygen dependence and diabetes meds.  OSA Nightly CPAP  Anxiety/depression Continue Effexor    Mobility: Independent at baseline.  Encourage ambulation  Goals of care   Code Status: Full Code     DVT prophylaxis:  enoxaparin  (LOVENOX ) injection 40 mg Start: 01/27/24 2200   Antimicrobials: Oral Levaquin Fluid: None Consultants: None Family Communication: None at bedside  Status: Inpatient Level of care:  Telemetry   Patient is from: Home Needs to continue in-hospital care: Needs to continue to monitor symptoms on Levaquin.  Pending GC probe Anticipated d/c to: Pending clinical course   Diet: Ordered Diet Order             Diet heart healthy/carb modified Fluid consistency: Thin  Diet effective now                   Scheduled Meds:  acidophilus  2 capsule Oral TID   carvedilol   12.5 mg Oral BID WC   enoxaparin  (LOVENOX ) injection  40 mg Subcutaneous Q24H   levofloxacin  500 mg Oral Daily   venlafaxine  XR  225 mg Oral Q breakfast    PRN meds: acetaminophen  **OR** acetaminophen , ondansetron  **OR** ondansetron  (ZOFRAN ) IV, sodium chloride  flush   Infusions:   cefTRIAXone (ROCEPHIN)  IV 2 g (01/29/24 0839)    Antimicrobials: Anti-infectives (From admission, onward)    Start     Dose/Rate Route Frequency Ordered Stop   01/29/24 1400  levofloxacin (LEVAQUIN) tablet 500 mg        500 mg Oral Daily 01/29/24 1307 02/08/24 0959   01/28/24 1000  cefTRIAXone (ROCEPHIN) 2 g in sodium chloride  0.9 % 100 mL IVPB  Status:  Discontinued        2 g 200 mL/hr over 30 Minutes Intravenous Every 24 hours 01/27/24 1231 01/27/24 1937   01/28/24 1000  cefTRIAXone (ROCEPHIN) 2 g in sodium chloride  0.9 % 100 mL IVPB        2 g 200 mL/hr over 30 Minutes Intravenous Every 24 hours 01/27/24 1937      01/27/24 2030  cefTRIAXone (ROCEPHIN) 1 g in sodium chloride  0.9 % 100 mL IVPB        1 g 200 mL/hr over 30 Minutes Intravenous  Once 01/27/24 1931 01/27/24 2056   01/27/24 1330  cefTRIAXone (ROCEPHIN) 1 g in sodium chloride  0.9 % 100 mL IVPB        1 g 200 mL/hr over 30 Minutes Intravenous  Once 01/27/24 1231 01/27/24 2024   01/26/24 2345  cefTRIAXone (ROCEPHIN) 1 g in sodium chloride  0.9 % 100 mL IVPB        1 g 200 mL/hr over 30 Minutes Intravenous  Once 01/26/24 2334 01/27/24 0032   01/26/24 2330  azithromycin (ZITHROMAX) 500 mg  in sodium chloride  0.9 % 250 mL IVPB        500 mg 250 mL/hr over 60 Minutes Intravenous  Once 01/26/24 2318 01/27/24 0145       Objective: Vitals:   01/29/24 0443 01/29/24 1109  BP: 132/75 124/75  Pulse: 60 63  Resp: 15 19  Temp: 98.2 F (36.8 C) 98.4 F (36.9 C)  SpO2: 98% 95%    Intake/Output Summary (Last 24 hours) at 01/29/2024 1312 Last data filed at 01/29/2024 1143 Gross per 24 hour  Intake 950 ml  Output 3625 ml  Net -2675 ml   Filed Weights   01/26/24 1642  Weight: 127 kg   Weight change:  Body mass index is 43.85 kg/m.   Physical Exam: General exam: Pleasant, elderly Caucasian male.  Not in physical distress.  Feels better Skin: No rashes, lesions or ulcers. HEENT: Atraumatic, normocephalic, no obvious bleeding Lungs: Clear to auscultation bilaterally,  CVS: S1, S2, no murmur,   GI/Abd: Soft, nontender, nondistended, bowel sound present,. CNS: Alert, awake, oriented x 3 Psychiatry: Mood appropriate,  Extremities: No pedal edema, no calf tenderness,   Data Review: I have personally reviewed the laboratory data and studies available.  F/u labs ordered Unresulted Labs (From admission, onward)    None       Signed, Chapman Rota, MD [facility] 01/29/2024

## 2024-01-30 DIAGNOSIS — N3000 Acute cystitis without hematuria: Secondary | ICD-10-CM | POA: Diagnosis not present

## 2024-01-30 LAB — GC/CHLAMYDIA PROBE AMP (~~LOC~~) NOT AT ARMC
Chlamydia: NEGATIVE
Comment: NEGATIVE
Comment: NORMAL
Neisseria Gonorrhea: NEGATIVE

## 2024-01-30 LAB — GLUCOSE, CAPILLARY
Glucose-Capillary: 106 mg/dL — ABNORMAL HIGH (ref 70–99)
Glucose-Capillary: 110 mg/dL — ABNORMAL HIGH (ref 70–99)
Glucose-Capillary: 108 mg/dL — ABNORMAL HIGH (ref 70–99)

## 2024-01-30 MED ORDER — LEVOFLOXACIN 750 MG PO TABS
750.0000 mg | ORAL_TABLET | Freq: Every day | ORAL | 0 refills | Status: AC
Start: 2024-01-31 — End: 2024-02-10

## 2024-01-30 MED ORDER — ORAL CARE MOUTH RINSE: 15.0000 mL | OROMUCOSAL | Status: DC | PRN

## 2024-01-30 MED ORDER — RISAQUAD PO CAPS: 2.0000 | ORAL_CAPSULE | Freq: Three times a day (TID) | ORAL | 0 refills | Status: AC

## 2024-01-30 NOTE — Discharge Summary (Signed)
 Physician Discharge Summary  Blake Garcia FMW:[id number] DOB: [birth year] DOA: 01/26/2024  PCP: Avelina Greig BRAVO, MD  Admit date: 01/26/2024 Discharge date: 01/30/2024  Admitted From: Home Discharge disposition: Home  Recommendations at discharge:  Completed a course of antibiotics with 10 more days of Levaquin daily with probiotics. Currently your blood pressure is controlled on on Coreg  12.5 mg twice daily.  Amlodipine , valsartan  and Aldactone  remain on hold. Continue to monitor blood pressure at home.  If systolic blood pressure rises above 140, can resume valsartan .   Follow-up with PCP as an outpatient for further adjustment.  Brief narrative: Blake Garcia is a 68 y.o. male with PMH significant for morbid obesity, h/o gastric bypass, OSA on CPAP HTN, HLD, CHB s/p PPM, diastolic dysfunction,  6/27, patient presented to the ED with persistent UTI symptoms despite antibiotics.   2 days prior on 6/25, patient was seen at [facility] urgent care for dysuria, cloudy foul-smelling urine for 3 days.  Urinalysis showed leukocytes, nitrites.  Urine culture was sent and patient was discharged on Macrobid . 6/27, he received a call from PCP to convey growth of E. coli in urine..  Patient reported persistent fever, dysuria.  With the concern of sepsis, patient was directed to ED.  In the ED, patient was afebrile, heart rate in 70s, blood pressure 130s.  Breathing on room air Initial labs with WBC count of 16.5, hemoglobin 12.4, lactic acid 2.1, sodium 132, BUN/creatinine 27/1.45, AST/ALT/ALK was elevated, bilirubin normal CT abdomen/pelvis without contrast showed mild posterior medial left basilar atelectasis and/or infiltrate.  12 mm nonobstructive right renal calculus.  Left renal cyst without follow-up recommending.   Blood culture was sent Patient was started on IV fluid, IV antibiotics Admitted to [facility]   Subjective: Patient was seen and examined this morning. Sitting up in recliner.  Feels  much better.  Burning urination resolved. Improving on oral Levaquin  Assessment and plan: Sepsis POA E. coli UTI  Right epididymoorchitis Presented with UTI symptoms not responding to outpatient Macrobid  Urine culture from 6/25 showed E. coli sensitive to Rocephin, Ancef  Blood culture sent on 6/27 is also growing E. coli. Patient mentioned right testicle swelling.  Ultrasound scrotum on 6/28 showed right epididymoorchitis Currently improving on IV Rocephin No fever.  WBC count improving.  Lactic acid level normalized. GC probe negative. Per 'OpenEvidence' guideline, switch patient to oral Levaquin 750 mg daily for next 10 days with probiotics. Recent Labs  Lab 01/26/24 1903 01/26/24 2231 01/27/24 0042 01/27/24 1250 01/28/24 0451 01/29/24 0414  WBC 16.5*  --   --  18.2* 12.6* 8.2  LATICACIDVEN  --  2.1* 1.8  --   --   --    AKI Creatinine normal at baseline.  Presented with creatinine elevated to 1.45. Improved with IV fluid Continue to monitor Recent Labs    03/30/23 0923 06/15/23 0733 12/15/23 0733 01/26/24 1903 01/27/24 1250 01/28/24 0451 01/29/24 0901  BUN 11 16 13  27* 23 21 16   CREATININE 0.82 0.92 0.82 1.45* 1.04 1.09 0.34*   Chronic hypokalemia Potassium level improved with replacement. Continue replacement like before Recent Labs  Lab 01/26/24 1903 01/27/24 1250 01/28/24 0451 01/29/24 0901  K 3.5 3.1* 3.0* 3.5  MG  --  2.1  --   --   PHOS  --  2.6  --   --    Transaminitis LFTs elevated Probably related to infection. AST, ALT, alk phos all gradually improving. Acute hepatitis panel unremarkable. Recent Labs  Lab 01/26/24 1903  01/27/24 1250 01/28/24 0451 01/29/24 0414 01/29/24 0901  AST 108* 60* 45*  --  35  ALT 109* 72* 61*  --  54*  ALKPHOS 183* 139* 129*  --  119  BILITOT 0.5 0.7 0.7  --  0.4  PROT 6.9 6.1* 6.2*  --  6.3*  ALBUMIN 3.0* 2.4* 2.4*  --  2.5*  PLT 141* 156 143* 154  --    Chronic diastolic heart  failure Hypertension No signs of CHF decompensation. PTA meds- Coreg  12.5 mg twice daily, amlodipine  10 mg daily, valsartan  320 mg daily, Aldactone  25 mg daily Currently on Coreg  12.5 mg twice daily.  Amlodipine , valsartan  and Aldactone  remain on hold. Blood pressure in normal range.   Continue to monitor blood pressure at home.  If systolic blood pressure rises above 140, can resume valsartan .  Follow-up with PCP as an outpatient for further adjustment.  CHB S/p PPM  HLD Statin on hold due to abnormal LFTs.  Plan to resume postdischarge.  Morbid Obesity  S/p bariatric surgery Body mass index is 43.85 kg/m. Patient has been advised to make an attempt to improve diet and exercise patterns to aid in weight loss. Patient states he has lost more than 200 pounds since bariatric surgery, gotten rid of oxygen dependence and diabetes meds.  OSA Nightly CPAP  Anxiety/depression Continue Effexor    Mobility: Independent at baseline.  Able to ambulate on the hallway without assistance  Goals of care   Code Status: Full Code   Diet:  Diet Order             Diet general           Diet heart healthy/carb modified Fluid consistency: Thin  Diet effective now                   Nutritional status:  Body mass index is 43.85 kg/m.       Wounds:  -    Discharge Exam:   Vitals:   01/29/24 2139 01/29/24 2222 01/30/24 0351 01/30/24 0940  BP: (!) 147/83  (!) 142/76 132/78  Pulse: (!) 59 61 60 71  Resp: 17 18 14 18   Temp: 98.4 F (36.9 C)  98.2 F (36.8 C) 98.4 F (36.9 C)  TempSrc: Oral  Oral Oral  SpO2: 100% 99% 99% 95%  Weight:      Height:        Body mass index is 43.85 kg/m.  General exam: Pleasant, elderly Caucasian male.  Not in physical distress.  Feels better Skin: No rashes, lesions or ulcers. HEENT: Atraumatic, normocephalic, no obvious bleeding Lungs: Clear to auscultation bilaterally,  CVS: S1, S2, no murmur,   GI/Abd: Soft, nontender, nondistended,  bowel sound present,. CNS: Alert, awake, oriented x 3 Psychiatry: Mood appropriate,  Extremities: No pedal edema, no calf tenderness,  Follow ups:    Follow-up Information     Avelina Greig BRAVO, MD Follow up.   Specialty: Family Medicine Contact information: [address] [place] [address] [phone]                 Discharge Instructions:   Discharge Instructions     Call MD for:  difficulty breathing, headache or visual disturbances   Complete by: As directed    Call MD for:  extreme fatigue   Complete by: As directed    Call MD for:  hives   Complete by: As directed    Call MD for:  persistant dizziness or  light-headedness   Complete by: As directed    Call MD for:  persistant nausea and vomiting   Complete by: As directed    Call MD for:  severe uncontrolled pain   Complete by: As directed    Call MD for:  temperature >100.4   Complete by: As directed    Diet general   Complete by: As directed    Discharge instructions   Complete by: As directed    Recommendations at discharge:   Completed a course of antibiotics with 10 more days of Levaquin daily with probiotics.  Currently your blood pressure is controlled on on Coreg  12.5 mg twice daily.  Amlodipine , valsartan  and Aldactone  remain on hold. Continue to monitor blood pressure at home.  If systolic blood pressure rises above 140, can resume valsartan .    Follow-up with PCP as an outpatient for further adjustment.  General discharge instructions: Follow with Primary MD Avelina Greig BRAVO, MD in 7 days  Please request your PCP  to go over your hospital tests, procedures, radiology results at the follow up. Please get your medicines reviewed and adjusted.  Your PCP may decide to repeat certain labs or tests as needed. Do not drive, operate heavy machinery, perform activities at heights, swimming or participation in water activities or provide baby sitting services if your were admitted for syncope  or siezures until you have seen by Primary MD or a Neurologist and advised to do so again. [place]  Controlled Substance Reporting System database was reviewed. Do not drive, operate heavy machinery, perform activities at heights, swim, participate in water activities or provide baby-sitting services while on medications for pain, sleep and mood until your outpatient physician has reevaluated you and advised to do so again.  You are strongly recommended to comply with the dose, frequency and duration of prescribed medications. Activity: As tolerated with Full fall precautions use walker/cane & assistance as needed Avoid using any recreational substances like cigarette, tobacco, alcohol, or non-prescribed drug. If you experience worsening of your admission symptoms, develop shortness of breath, life threatening emergency, suicidal or homicidal thoughts you must seek medical attention immediately by calling 911 or calling your MD immediately  if symptoms less severe. You must read complete instructions/literature along with all the possible adverse reactions/side effects for all the medicines you take and that have been prescribed to you. Take any new medicine only after you have completely understood and accepted all the possible adverse reactions/side effects.  Wear Seat belts while driving. You were cared for by a hospitalist during your hospital stay. If you have any questions about your discharge medications or the care you received while you were in the hospital after you are discharged, you can call the unit and ask to speak with the hospitalist or the covering physician. Once you are discharged, your primary care physician will handle any further medical issues. Please note that NO REFILLS for any discharge medications will be authorized once you are discharged, as it is imperative that you return to your primary care physician (or establish a relationship with a primary care physician if you do  not have one).   Increase activity slowly   Complete by: As directed        Discharge Medications:   Allergies as of 01/30/2024   No Known Allergies      Medication List     PAUSE taking these medications    spironolactone  25 MG tablet Wait to take this until your doctor or other  care provider tells you to start again. Commonly known as: ALDACTONE  TAKE 1 TABLET BY MOUTH DAILY   valsartan  320 MG tablet Wait to take this until your doctor or other care provider tells you to start again. Commonly known as: DIOVAN  Take 1 tablet (320 mg total) by mouth daily.       STOP taking these medications    amLODipine  10 MG tablet Commonly known as: NORVASC    nitrofurantoin  (macrocrystal-monohydrate) 100 MG capsule Commonly known as: MACROBID        TAKE these medications    acidophilus Caps capsule Take 2 capsules by mouth 3 (three) times daily for 10 days.   Bariatric Multivitamins/Iron Caps Take 1 capsule by mouth daily.   CALCIUM 500 + D PO Take 3 tablets by mouth every evening.   carvedilol  12.5 MG tablet Commonly known as: COREG  TAKE 1 TABLET BY MOUTH TWICE  DAILY WITH MEALS   levofloxacin 750 MG tablet Commonly known as: LEVAQUIN Take 1 tablet (750 mg total) by mouth daily for 10 days. Start taking on: January 31, 2024   potassium chloride  SA 20 MEQ tablet Commonly known as: KLOR-CON  M TAKE 1 TABLET BY MOUTH DAILY   simvastatin  20 MG tablet Commonly known as: ZOCOR  TAKE 1 TABLET BY MOUTH AT  BEDTIME   triamcinolone  cream 0.1 % Commonly known as: KENALOG  APPLY TO AFFECTED AREAS TWICE A DAY IF NEEDED   venlafaxine  XR 75 MG 24 hr capsule Commonly known as: EFFEXOR -XR TAKE 3 CAPSULES BY MOUTH ONCE DAILY         The results of significant diagnostics from this hospitalization (including imaging, microbiology, ancillary and laboratory) are listed below for reference.    Procedures and Diagnostic Studies:   CT Renal Stone Study Result Date:  01/26/2024 CLINICAL DATA:  Flank pain. EXAM: CT ABDOMEN AND PELVIS WITHOUT CONTRAST TECHNIQUE: Multidetector CT imaging of the abdomen and pelvis was performed following the standard protocol without IV contrast. RADIATION DOSE REDUCTION: This exam was performed according to the departmental dose-optimization program which includes automated exposure control, adjustment of the mA and/or kV according to patient size and/or use of iterative reconstruction technique. COMPARISON:  None Available. FINDINGS: Lower chest: Mild bilateral lobe linear scarring and/or atelectasis is seen. Mild posteromedial left basilar atelectasis and/or infiltrate is also present. Hepatobiliary: No focal liver abnormality is seen. Status post cholecystectomy. No biliary dilatation. Pancreas: Unremarkable. No pancreatic ductal dilatation or surrounding inflammatory changes. Spleen: Normal in size without focal abnormality. Adrenals/Urinary Tract: Diffuse bilateral adrenal gland enlargement is seen. Kidneys are normal in size. A 5.7 cm diameter cyst is seen along the upper pole of the left kidney, with a 3.2 cm diameter cyst seen along the lower left kidney. A 12 mm nonobstructing renal calculus is seen within the mid right kidney. No obstructing renal calculi are seen. There is mild, bilateral nonspecific perinephric inflammatory fat stranding. The urinary bladder is empty and subsequently limited in evaluation. Mild, diffuse urinary bladder wall thickening is noted. Stomach/Bowel: Surgical sutures are present throughout the gastric region. Surgically anastomosed bowel is also seen within the anterior aspect of the mid right abdomen. Appendix appears normal. No evidence of bowel wall thickening, distention, or inflammatory changes. Noninflamed diverticula are seen throughout the sigmoid colon. Vascular/Lymphatic: Mild aortic atherosclerosis. No enlarged abdominal or pelvic lymph nodes. Reproductive: Prostate is unremarkable. Other: No  abdominal wall hernia or abnormality. No abdominopelvic ascites. Musculoskeletal: No acute or significant osseous findings. IMPRESSION: 1. Mild posteromedial left basilar atelectasis and/or infiltrate. 2. 12  mm nonobstructing right renal calculus. 3. Left renal cysts. No follow-up imaging is recommended. This recommendation follows ACR consensus guidelines: Management of the Incidental Renal Mass on CT: A White Paper of the ACR Incidental Findings Committee. J Am Coll Radiol [phone]. 4. Sigmoid diverticulosis. 5. Aortic atherosclerosis. Electronically Signed   By: Suzen Dials M.D.   On: 01/26/2024 22:31   CUP PACEART REMOTE DEVICE CHECK Result Date: 01/26/2024 PPM scheduled remote reviewed. Normal device function.  Presenting rhythm: AS-VP 1641 AMS events. All < 1 min.  All c/w FFRW oversensing. Next remote 91 days. AB, CVRS    Labs:   Basic Metabolic Panel: Recent Labs  Lab 01/26/24 1903 01/27/24 1250 01/28/24 0451 01/29/24 0901  NA 132* 135 134* 135  K 3.5 3.1* 3.0* 3.5  CL 96* 98 100 104  CO2 24 25 23 22   GLUCOSE 134* 117* 114* 131*  BUN 27* 23 21 16   CREATININE 1.45* 1.04 1.09 0.34*  CALCIUM 9.0 8.0* 7.9* 8.0*  MG  --  2.1  --   --   PHOS  --  2.6  --   --    GFR Estimated Creatinine Clearance: 114.7 mL/min (A) (by C-G formula based on SCr of 0.34 mg/dL (L)). Liver Function Tests: Recent Labs  Lab 01/26/24 1903 01/27/24 1250 01/28/24 0451 01/29/24 0901  AST 108* 60* 45* 35  ALT 109* 72* 61* 54*  ALKPHOS 183* 139* 129* 119  BILITOT 0.5 0.7 0.7 0.4  PROT 6.9 6.1* 6.2* 6.3*  ALBUMIN 3.0* 2.4* 2.4* 2.5*   No results for input(s): LIPASE, AMYLASE in the last 168 hours. No results for input(s): AMMONIA in the last 168 hours. Coagulation profile No results for input(s): INR, PROTIME in the last 168 hours.  CBC: Recent Labs  Lab 01/26/24 1903 01/27/24 1250 01/28/24 0451 01/29/24 0414  WBC 16.5* 18.2* 12.6* 8.2  NEUTROABS 13.7*  --   --  5.5   HGB 12.4* 12.1* 12.3* 12.1*  HCT 36.0* 35.8* 36.4* 36.3*  MCV 88.0 89.7 90.3 91.4  PLT 141* 156 143* 154   Cardiac Enzymes: No results for input(s): CKTOTAL, CKMB, CKMBINDEX, TROPONINI in the last 168 hours. BNP: Invalid input(s): POCBNP CBG: Recent Labs  Lab 01/29/24 1543 01/29/24 2139 01/30/24 0655 01/30/24 0726 01/30/24 1144  GLUCAP 91 131* 106* 110* 108*   D-Dimer No results for input(s): DDIMER in the last 72 hours. Hgb A1c No results for input(s): HGBA1C in the last 72 hours. Lipid Profile No results for input(s): CHOL, HDL, LDLCALC, TRIG, CHOLHDL, LDLDIRECT in the last 72 hours. Thyroid  function studies No results for input(s): TSH, T4TOTAL, T3FREE, THYROIDAB in the last 72 hours.  Invalid input(s): FREET3 Anemia work up No results for input(s): VITAMINB12, FOLATE, FERRITIN, TIBC, IRON, RETICCTPCT in the last 72 hours. Microbiology Recent Results (from the past 240 hours)  Urine Culture     Status: Abnormal   Collection Time: 01/24/24  9:32 AM   Specimen: Urine, Clean Catch  Result Value Ref Range Status   Specimen Description URINE, CLEAN CATCH  Final   Special Requests   Final    NONE Performed at [facility], [address] [place], [place] [address]    Culture >=100,000 COLONIES/mL ESCHERICHIA COLI (A)  Final   Report Status 01/26/2024 FINAL  Final   Organism ID, Bacteria ESCHERICHIA COLI (A)  Final      Susceptibility   Escherichia coli - MIC*    AMPICILLIN  >=32 RESISTANT Resistant  CEFAZOLIN  <=4 SENSITIVE Sensitive     CEFEPIME <=0.12 SENSITIVE Sensitive     CEFTRIAXONE <=0.25 SENSITIVE Sensitive     CIPROFLOXACIN <=0.25 SENSITIVE Sensitive     GENTAMICIN  <=1 SENSITIVE Sensitive     IMIPENEM 0.5 SENSITIVE Sensitive     NITROFURANTOIN  <=16 SENSITIVE Sensitive     TRIMETH/SULFA <=20 SENSITIVE Sensitive     AMPICILLIN /SULBACTAM >=32 RESISTANT Resistant     PIP/TAZO <=4 SENSITIVE Sensitive  ug/mL    * >=100,000 COLONIES/mL ESCHERICHIA COLI  Culture, blood (Routine X 2) w Reflex to ID Panel     Status: Abnormal   Collection Time: 01/26/24 10:30 PM   Specimen: BLOOD  Result Value Ref Range Status   Specimen Description   Final    BLOOD LEFT ANTECUBITAL Performed at [facility], [address], [place], [place] [address]    Special Requests   Final    BOTTLES DRAWN AEROBIC AND ANAEROBIC Blood Culture results may not be optimal due to an inadequate volume of blood received in culture bottles Performed at [facility], [address], [place], [place] [address]    Culture  Setup Time   Final    GRAM NEGATIVE RODS IN BOTH AEROBIC AND ANAEROBIC BOTTLES CRITICAL RESULT CALLED TO, READ BACK BY AND VERIFIED WITH: [facility] MICHELLE BELL [id number] AT 1929 BY EC Performed at [facility], [address], [place], [place] [address]    Culture ESCHERICHIA COLI (A)  Final   Report Status 01/29/2024 FINAL  Final   Organism ID, Bacteria ESCHERICHIA COLI  Final   Organism ID, Bacteria ESCHERICHIA COLI  Final      Susceptibility   Escherichia coli - KIRBY BAUER*    CEFAZOLIN  RESISTANT Resistant    Escherichia coli - MIC*    AMPICILLIN  >=32 RESISTANT Resistant     CEFEPIME <=0.12 SENSITIVE Sensitive     CEFTAZIDIME <=1 SENSITIVE Sensitive     CEFTRIAXONE <=0.25 SENSITIVE Sensitive     CIPROFLOXACIN <=0.25 SENSITIVE Sensitive     GENTAMICIN  <=1 SENSITIVE Sensitive     IMIPENEM <=0.25 SENSITIVE Sensitive     TRIMETH/SULFA <=20 SENSITIVE Sensitive     AMPICILLIN /SULBACTAM >=32 RESISTANT Resistant     PIP/TAZO <=4 SENSITIVE Sensitive ug/mL    * ESCHERICHIA COLI    ESCHERICHIA COLI  Blood Culture ID Panel (Reflexed)     Status: Abnormal   Collection Time: 01/26/24 10:30 PM  Result Value Ref Range Status   Enterococcus faecalis NOT DETECTED NOT DETECTED Final   Enterococcus Faecium NOT DETECTED NOT DETECTED Final   Listeria monocytogenes NOT  DETECTED NOT DETECTED Final   Staphylococcus species NOT DETECTED NOT DETECTED Final   Staphylococcus aureus (BCID) NOT DETECTED NOT DETECTED Final   Staphylococcus epidermidis NOT DETECTED NOT DETECTED Final   Staphylococcus lugdunensis NOT DETECTED NOT DETECTED Final   Streptococcus species NOT DETECTED NOT DETECTED Final   Streptococcus agalactiae NOT DETECTED NOT DETECTED Final   Streptococcus pneumoniae NOT DETECTED NOT DETECTED Final   Streptococcus pyogenes NOT DETECTED NOT DETECTED Final   A.calcoaceticus-baumannii NOT DETECTED NOT DETECTED Final   Bacteroides fragilis NOT DETECTED NOT DETECTED Final   Enterobacterales DETECTED (A) NOT DETECTED Final    Comment: Enterobacterales represent a large order of gram negative bacteria, not a single organism. CRITICAL RESULT CALLED TO, READ BACK BY AND VERIFIED WITH: [facility] MICHELLE BELL [id number] AT 1929 BY EC    Enterobacter cloacae complex NOT DETECTED NOT DETECTED Final   Escherichia coli DETECTED (A) NOT  DETECTED Final    Comment: CRITICAL RESULT CALLED TO, READ BACK BY AND VERIFIED WITH: PHARMD MICHELLE BELL [id number] AT 1929 BY EC    Klebsiella aerogenes NOT DETECTED NOT DETECTED Final   Klebsiella oxytoca NOT DETECTED NOT DETECTED Final   Klebsiella pneumoniae NOT DETECTED NOT DETECTED Final   Proteus species NOT DETECTED NOT DETECTED Final   Salmonella species NOT DETECTED NOT DETECTED Final   Serratia marcescens NOT DETECTED NOT DETECTED Final   Haemophilus influenzae NOT DETECTED NOT DETECTED Final   Neisseria meningitidis NOT DETECTED NOT DETECTED Final   Pseudomonas aeruginosa NOT DETECTED NOT DETECTED Final   Stenotrophomonas maltophilia NOT DETECTED NOT DETECTED Final   Candida albicans NOT DETECTED NOT DETECTED Final   Candida auris NOT DETECTED NOT DETECTED Final   Candida glabrata NOT DETECTED NOT DETECTED Final   Candida krusei NOT DETECTED NOT DETECTED Final   Candida parapsilosis NOT DETECTED NOT DETECTED Final    Candida tropicalis NOT DETECTED NOT DETECTED Final   Cryptococcus neoformans/gattii NOT DETECTED NOT DETECTED Final   CTX-M ESBL NOT DETECTED NOT DETECTED Final   Carbapenem resistance IMP NOT DETECTED NOT DETECTED Final   Carbapenem resistance KPC NOT DETECTED NOT DETECTED Final   Carbapenem resistance NDM NOT DETECTED NOT DETECTED Final   Carbapenem resist OXA 48 LIKE NOT DETECTED NOT DETECTED Final   Carbapenem resistance VIM NOT DETECTED NOT DETECTED Final    Comment: Performed at [facility], [address], [place], [place] [address]  Culture, blood (Routine X 2) w Reflex to ID Panel     Status: Abnormal   Collection Time: 01/26/24 10:31 PM   Specimen: BLOOD  Result Value Ref Range Status   Specimen Description   Final    BLOOD RIGHT ANTECUBITAL Performed at [facility], [address] [place], [place] [address]    Special Requests   Final    BOTTLES DRAWN AEROBIC AND ANAEROBIC Blood Culture adequate volume Performed at [facility], [address] [place], [place] [address]    Culture  Setup Time   Final    GRAM NEGATIVE RODS IN BOTH AEROBIC AND ANAEROBIC BOTTLES CRITICAL VALUE NOTED.  VALUE IS CONSISTENT WITH PREVIOUSLY REPORTED AND CALLED VALUE.    Culture (A)  Final    ESCHERICHIA COLI SUSCEPTIBILITIES PERFORMED ON PREVIOUS CULTURE WITHIN THE LAST 5 DAYS. Performed at [facility], [address], [place], [place] [address]    Report Status 01/29/2024 FINAL  Final    Time coordinating discharge: 45 minutes  Signed: Jaan Fischel  [facility] 01/30/2024, 1:15 PM

## 2024-01-30 NOTE — Progress Notes (Signed)
 Mobility Specialist - Progress Note   01/30/24 1048  Mobility  Activity Ambulated independently in hallway  Level of Assistance Independent  Assistive Device None  Distance Ambulated (ft) 700 ft  Activity Response Tolerated well  Mobility Referral Yes  Mobility visit 1 Mobility  Mobility Specialist Start Time (ACUTE ONLY) 1037  Mobility Specialist Stop Time (ACUTE ONLY) 1047  Mobility Specialist Time Calculation (min) (ACUTE ONLY) 10 min   Pt received in recliner and agreeable to mobility. No complaints during session. Pt to recliner after session with all needs met.    [facility]

## 2024-01-31 ENCOUNTER — Telehealth: Payer: Self-pay

## 2024-01-31 NOTE — Transitions of Care (Post Inpatient/ED Visit) (Signed)
   01/31/2024  Name: Blake Garcia MRN: [id number] DOB: 08-11-1955  Today's TOC FU Call Status: Today's TOC FU Call Status:: Unsuccessful Call (1st Attempt) Unsuccessful Call (1st Attempt) Date: 01/31/24  Attempted to reach the patient regarding the most recent Inpatient/ED visit.  Follow Up Plan: Additional outreach attempts will be made to reach the patient to complete the Transitions of Care (Post Inpatient/ED visit) call.   Richerd Fish, RN, BSN, CCM [facility], [facility] RN Care Manager Direct Dial: [phone]

## 2024-02-01 ENCOUNTER — Telehealth: Payer: Self-pay

## 2024-02-01 NOTE — Transitions of Care (Post Inpatient/ED Visit) (Signed)
   02/01/2024  Name: Blake Garcia MRN: [id number] DOB: 07/22/1956  Today's TOC FU Call Status: Today's TOC FU Call Status:: Successful TOC FU Call Completed TOC FU Call Complete Date: 02/01/24 Patient's Name and Date of Birth confirmed.  Transition Care Management Follow-up Telephone Call Date of Discharge: 01/29/24 Discharge Facility: [facility]) Type of Discharge: Inpatient Admission Primary Inpatient Discharge Diagnosis:: Urinary Tract Infection How have you been since you were released from the hospital?: Better Any questions or concerns?: No  Items Reviewed: Did you receive and understand the discharge instructions provided?: No Medications obtained,verified, and reconciled?: Yes (Medications Reviewed) Any new allergies since your discharge?: No Dietary orders reviewed?: No (states on same diet with no changes)  Medications Reviewed Today: Medications Reviewed Today     Reviewed by Eilleen Richerd GRADE, RN (Registered Nurse) on 02/01/24 at 1352  Med List Status: <None>   Medication Order Taking? Sig Documenting Provider Last Dose Status Informant  acidophilus (RISAQUAD) CAPS capsule [id number]  Take 2 capsules by mouth 3 (three) times daily for 10 days. Arlice Reichert, MD  Active   Calcium Carb-Cholecalciferol (CALCIUM 500 + D PO) [id number] No Take 3 tablets by mouth every evening. [provider] Past Week Active Self, Pharmacy Records  carvedilol  (COREG ) 12.5 MG tablet [id number] No TAKE 1 TABLET BY MOUTH TWICE  DAILY WITH MEALS Arida, Muhammad A, MD 01/26/2024 Evening Active Self, Pharmacy Records  levofloxacin (LEVAQUIN) 750 MG tablet [id number]  Take 1 tablet (750 mg total) by mouth daily for 10 days. Arlice Reichert, MD  Active   Multiple Vitamins-Minerals (BARIATRIC MULTIVITAMINS/IRON) CAPS [id number] No Take 1 capsule by mouth daily. [provider] 01/26/2024 Active Self, Pharmacy Records  potassium chloride  SA (KLOR-CON  M) 20 MEQ tablet [id number] No  TAKE 1 TABLET BY MOUTH DAILY Avelina Greig BRAVO, MD 01/26/2024 Active Self, Pharmacy Records  simvastatin  (ZOCOR ) 20 MG tablet [id number] No TAKE 1 TABLET BY MOUTH AT  BEDTIME Copland, Jacques, MD Past Week Active Self, Pharmacy Records  spironolactone  (ALDACTONE ) 25 MG tablet [id number] No TAKE 1 TABLET BY MOUTH DAILY Darron Deatrice LABOR, MD 01/26/2024 Active Self, Pharmacy Records  triamcinolone  cream (KENALOG ) 0.1 % [id number] No APPLY TO AFFECTED AREAS TWICE A DAY IF NEEDED Bedsole, Amy E, MD Unknown Active Self, Pharmacy Records  valsartan  (DIOVAN ) 320 MG tablet [id number] No Take 1 tablet (320 mg total) by mouth daily. Gerard Frederick, NP 01/26/2024 Active Self, Pharmacy Records  venlafaxine  XR (EFFEXOR -XR) 75 MG 24 hr capsule [id number] No TAKE 3 CAPSULES BY MOUTH ONCE DAILY Avelina, Amy E, MD 01/26/2024 Active Self, Pharmacy Records           Decline 30 day TOC program   SDOH Interventions Today    Flowsheet Row Most Recent Value  SDOH Interventions   Food Insecurity Interventions Intervention Not Indicated  Housing Interventions Intervention Not Indicated  Transportation Interventions Intervention Not Indicated  Utilities Interventions Intervention Not Indicated    Richerd Eilleen, RN, BSN, CCM [place]  [facility], [facility] RN Care Manager Direct Dial: [phone]

## 2024-02-02 ENCOUNTER — Encounter: Payer: Self-pay | Admitting: Family Medicine

## 2024-02-06 ENCOUNTER — Telehealth: Payer: Self-pay | Admitting: *Deleted

## 2024-02-06 ENCOUNTER — Ambulatory Visit: Admitting: Family Medicine

## 2024-02-06 ENCOUNTER — Encounter: Payer: Self-pay | Admitting: Family Medicine

## 2024-02-06 VITALS — BP 110/72 | HR 86 | Temp 98.2°F | Ht 67.0 in | Wt 291.4 lb

## 2024-02-06 DIAGNOSIS — Z8619 Personal history of other infectious and parasitic diseases: Secondary | ICD-10-CM

## 2024-02-06 DIAGNOSIS — N453 Epididymo-orchitis: Secondary | ICD-10-CM | POA: Insufficient documentation

## 2024-02-06 DIAGNOSIS — E1159 Type 2 diabetes mellitus with other circulatory complications: Secondary | ICD-10-CM | POA: Diagnosis not present

## 2024-02-06 DIAGNOSIS — N390 Urinary tract infection, site not specified: Secondary | ICD-10-CM | POA: Diagnosis not present

## 2024-02-06 DIAGNOSIS — B9620 Unspecified Escherichia coli [E. coli] as the cause of diseases classified elsewhere: Secondary | ICD-10-CM

## 2024-02-06 DIAGNOSIS — Z87438 Personal history of other diseases of male genital organs: Secondary | ICD-10-CM

## 2024-02-06 DIAGNOSIS — I152 Hypertension secondary to endocrine disorders: Secondary | ICD-10-CM

## 2024-02-06 DIAGNOSIS — R7989 Other specified abnormal findings of blood chemistry: Secondary | ICD-10-CM | POA: Diagnosis not present

## 2024-02-06 DIAGNOSIS — A4151 Sepsis due to Escherichia coli [E. coli]: Secondary | ICD-10-CM

## 2024-02-06 DIAGNOSIS — N179 Acute kidney failure, unspecified: Secondary | ICD-10-CM

## 2024-02-06 DIAGNOSIS — E876 Hypokalemia: Secondary | ICD-10-CM

## 2024-02-06 NOTE — Assessment & Plan Note (Signed)
 Chronic, now continues to be well-controlled only on Coreg  twice daily.  He continues to hold valsartan , amlodipine  and spironolactone .  We discussed if blood pressure starts trending up above 1 40-50/90 he will restart valsartan .  If he has more of a's peripheral swelling issue he can use spironolactone  daily to as needed.

## 2024-02-06 NOTE — Assessment & Plan Note (Signed)
 Acute, complete full Levaquin  course.  Patient tolerating antibiotics well.  Continue pushing fluids.  Return and ER precautions provided

## 2024-02-06 NOTE — Assessment & Plan Note (Signed)
 Acute, resolved status post antibiotics.

## 2024-02-06 NOTE — Patient Instructions (Addendum)
 Please stop at the lab to have labs drawn.  Stay off BP meds except  coreg  twice daily as long as BP < 150/90.  If swelling return restart the spironolactone .

## 2024-02-06 NOTE — Telephone Encounter (Signed)
 Forms in Dr. Sherrel office in box to complete.

## 2024-02-06 NOTE — Progress Notes (Signed)
 Patient ID: Blake Garcia, male    DOB: [birth year], 68 y.o.   MRN: [id number]  This visit was conducted in person.  BP 110/72   Pulse 86   Temp 98.2 F (36.8 C) (Temporal)   Ht 5' 7 (1.702 m)   Wt 291 lb 6 oz (132.2 kg)   SpO2 96%   BMI 45.64 kg/m    CC:  Chief Complaint  Patient presents with   Hospitalization Follow-up    Subjective:   HPI: Blake Garcia is a 68 y.o. male presenting on 02/06/2024 for Hospitalization Follow-up  Recent hospital admission January 26, 2024 for acute cystitis with sepsis, acute kidney injury He presented to the ED with persistent UTI symptoms despite antibiotics.  Failed oral over-the-counter antibiotics. He could not lie bacteria seen in urine. Creatinine was 1.45, AST/ALT was elevated, bilirubin normal.  Sodium low at 132  CT abdomen and pelvis: Showed mild posterior medial left basilar atelectasis and/or infiltrate, 12 mm nonobstructive right renal calculus, left renal cyst  Blood culture returned showing E. coli Treated with IV fluids and IV antibiotics.    Discharge diagnoses include E. coli UTI with urosepsis and right  Ultrasound scrotum on June 28 showed right epididymoorchitis.  Date of discharge January 30, 2024. Recommended Levaquin  x 10 more days as outpatient along with probiotics. Had recommended continuation of holding amlodipine , valsartan  and Aldactone .  Continued Coreg  12.5 mg twice daily.   Since discharge.. no fever, no dysuria, still urinary frequency  but still not same volume. No further swelling in right testicle.  No flank pain. He is pushing fluids.  Eating a banana everyday. Good appetite.  Wt Readings from Last 3 Encounters:  02/06/24 291 lb 6 oz (132.2 kg)  01/26/24 280 lb (127 kg)  12/26/23 (!) 303 lb (137.4 kg)    Relevant past medical, surgical, family and social history reviewed and updated as indicated. Interim medical history since our last visit reviewed. Allergies and medications reviewed and  updated. Outpatient Medications Prior to Visit  Medication Sig Dispense Refill   acidophilus (RISAQUAD) CAPS capsule Take 2 capsules by mouth 3 (three) times daily for 10 days. 60 capsule 0   Calcium Carb-Cholecalciferol (CALCIUM 600 + D PO) Take 1 tablet by mouth in the morning, at noon, and at bedtime.     carvedilol  (COREG ) 12.5 MG tablet TAKE 1 TABLET BY MOUTH TWICE  DAILY WITH MEALS 180 tablet 3   levofloxacin  (LEVAQUIN ) 750 MG tablet Take 1 tablet (750 mg total) by mouth daily for 10 days. 10 tablet 0   Multiple Vitamins-Minerals (BARIATRIC MULTIVITAMINS/IRON) CAPS Take 1 capsule by mouth daily.     Nutritional Supplements (NUTRITIONAL SUPPLEMENT PO) Take 1 tablet by mouth in the morning and at bedtime. Healthy Aging     potassium chloride  SA (KLOR-CON  M) 20 MEQ tablet TAKE 1 TABLET BY MOUTH DAILY 100 tablet 2   simvastatin  (ZOCOR ) 20 MG tablet TAKE 1 TABLET BY MOUTH AT  BEDTIME 100 tablet 2   triamcinolone  cream (KENALOG ) 0.1 % APPLY TO AFFECTED AREAS TWICE A DAY IF NEEDED 454 g 0   venlafaxine  XR (EFFEXOR -XR) 75 MG 24 hr capsule TAKE 3 CAPSULES BY MOUTH ONCE DAILY 270 capsule 3   amLODipine  (NORVASC ) 10 MG tablet Take 10 mg by mouth daily. (Patient not taking: Reported on 02/06/2024)     spironolactone  (ALDACTONE ) 25 MG tablet TAKE 1 TABLET BY MOUTH DAILY (Patient not taking: Reported on 02/06/2024) 90 tablet 3   valsartan  (  DIOVAN ) 320 MG tablet Take 1 tablet (320 mg total) by mouth daily. (Patient not taking: Reported on 02/06/2024) 90 tablet 3   Calcium Carb-Cholecalciferol (CALCIUM 500 + D PO) Take 3 tablets by mouth every evening.     No facility-administered medications prior to visit.     Per HPI unless specifically indicated in ROS section below Review of Systems  Constitutional:  Negative for fatigue and fever.  HENT:  Negative for ear pain.   Eyes:  Negative for pain.  Respiratory:  Negative for cough and shortness of breath.   Cardiovascular:  Negative for chest pain,  palpitations and leg swelling.  Gastrointestinal:  Negative for abdominal pain.  Genitourinary:  Negative for dysuria.  Musculoskeletal:  Negative for arthralgias.  Neurological:  Negative for syncope, light-headedness and headaches.  Psychiatric/Behavioral:  Negative for dysphoric mood.    Objective:  BP 110/72   Pulse 86   Temp 98.2 F (36.8 C) (Temporal)   Ht 5' 7 (1.702 m)   Wt 291 lb 6 oz (132.2 kg)   SpO2 96%   BMI 45.64 kg/m   Wt Readings from Last 3 Encounters:  02/06/24 291 lb 6 oz (132.2 kg)  01/26/24 280 lb (127 kg)  12/26/23 (!) 303 lb (137.4 kg)      Physical Exam Vitals reviewed.  Constitutional:      Appearance: He is well-developed.  HENT:     Head: Normocephalic.     Right Ear: Hearing normal.     Left Ear: Hearing normal.     Nose: Nose normal.  Neck:     Thyroid : No thyroid  mass or thyromegaly.     Vascular: No carotid bruit.     Trachea: Trachea normal.  Cardiovascular:     Rate and Rhythm: Normal rate and regular rhythm.     Pulses: Normal pulses.     Heart sounds: Heart sounds not distant. No murmur heard.    No friction rub. No gallop.     Comments: No peripheral edema Pulmonary:     Effort: Pulmonary effort is normal. No respiratory distress.     Breath sounds: Normal breath sounds.  Skin:    General: Skin is warm and dry.     Findings: No rash.  Psychiatric:        Speech: Speech normal.        Behavior: Behavior normal.        Thought Content: Thought content normal.       Results for orders placed or performed during the hospital encounter of 01/26/24  CBC with Differential   Collection Time: 01/26/24  7:03 PM  Result Value Ref Range   WBC 16.5 (H) 4.0 - 10.5 K/uL   RBC 4.09 (L) 4.22 - 5.81 MIL/uL   Hemoglobin 12.4 (L) 13.0 - 17.0 g/dL   HCT 63.9 (L) 60.9 - 47.9 %   MCV 88.0 80.0 - 100.0 fL   MCH 30.3 26.0 - 34.0 pg   MCHC 34.4 30.0 - 36.0 g/dL   RDW 86.7 88.4 - 84.4 %   Platelets 141 (L) 150 - 400 K/uL   nRBC 0.0 0.0 -  0.2 %   Neutrophils Relative % 83 %   Neutro Abs 13.7 (H) 1.7 - 7.7 K/uL   Lymphocytes Relative 8 %   Lymphs Abs 1.2 0.7 - 4.0 K/uL   Monocytes Relative 8 %   Monocytes Absolute 1.3 (H) 0.1 - 1.0 K/uL   Eosinophils Relative 0 %   Eosinophils Absolute 0.1 0.0 -  0.5 K/uL   Basophils Relative 0 %   Basophils Absolute 0.0 0.0 - 0.1 K/uL   Immature Granulocytes 1 %   Abs Immature Granulocytes 0.08 (H) 0.00 - 0.07 K/uL  Comprehensive metabolic panel   Collection Time: 01/26/24  7:03 PM  Result Value Ref Range   Sodium 132 (L) 135 - 145 mmol/L   Potassium 3.5 3.5 - 5.1 mmol/L   Chloride 96 (L) 98 - 111 mmol/L   CO2 24 22 - 32 mmol/L   Glucose, Bld 134 (H) 70 - 99 mg/dL   BUN 27 (H) 8 - 23 mg/dL   Creatinine, Ser 8.54 (H) 0.61 - 1.24 mg/dL   Calcium 9.0 8.9 - 89.6 mg/dL   Total Protein 6.9 6.5 - 8.1 g/dL   Albumin 3.0 (L) 3.5 - 5.0 g/dL   AST 891 (H) 15 - 41 U/L   ALT 109 (H) 0 - 44 U/L   Alkaline Phosphatase 183 (H) 38 - 126 U/L   Total Bilirubin 0.5 0.0 - 1.2 mg/dL   GFR, Estimated 53 (L) >60 mL/min   Anion gap 11 5 - 15  Urinalysis, Routine w reflex microscopic -Urine, Clean Catch   Collection Time: 01/26/24  8:03 PM  Result Value Ref Range   Color, Urine YELLOW YELLOW   APPearance HAZY (A) CLEAR   Specific Gravity, Urine 1.014 1.005 - 1.030   pH 6.0 5.0 - 8.0   Glucose, UA NEGATIVE NEGATIVE mg/dL   Hgb urine dipstick NEGATIVE NEGATIVE   Bilirubin Urine NEGATIVE NEGATIVE   Ketones, ur NEGATIVE NEGATIVE mg/dL   Protein, ur 30 (A) NEGATIVE mg/dL   Nitrite NEGATIVE NEGATIVE   Leukocytes,Ua LARGE (A) NEGATIVE   RBC / HPF 0-5 0 - 5 RBC/hpf   WBC, UA >50 0 - 5 WBC/hpf   Bacteria, UA FEW (A) NONE SEEN   Squamous Epithelial / HPF 0-5 0 - 5 /HPF   WBC Clumps PRESENT    Mucus PRESENT    Hyaline Casts, UA PRESENT    Cellular Cast, UA 10   Culture, blood (Routine X 2) w Reflex to ID Panel   Collection Time: 01/26/24 10:30 PM   Specimen: BLOOD  Result Value Ref Range    Specimen Description      BLOOD LEFT ANTECUBITAL Performed at [facility], [address] [place], [place] [address]    Special Requests      BOTTLES DRAWN AEROBIC AND ANAEROBIC Blood Culture results may not be optimal due to an inadequate volume of blood received in culture bottles Performed at [facility], [address], [place], [place] [address]    Culture  Setup Time      GRAM NEGATIVE RODS IN BOTH AEROBIC AND ANAEROBIC BOTTLES CRITICAL RESULT CALLED TO, READ BACK BY AND VERIFIED WITH: [facility] MICHELLE BELL [id number] AT 1929 BY EC Performed at [facility], [address], [place], [place] [address]    Culture ESCHERICHIA COLI (A)    Report Status 01/29/2024 FINAL    Organism ID, Bacteria ESCHERICHIA COLI    Organism ID, Bacteria ESCHERICHIA COLI       Susceptibility   Escherichia coli - KIRBY BAUER*    CEFAZOLIN  RESISTANT Resistant    Escherichia coli - MIC*    AMPICILLIN  >=32 RESISTANT Resistant     CEFEPIME <=0.12 SENSITIVE Sensitive     CEFTAZIDIME <=1 SENSITIVE Sensitive     CEFTRIAXONE  <=0.25 SENSITIVE Sensitive     CIPROFLOXACIN <=0.25 SENSITIVE Sensitive  GENTAMICIN  <=1 SENSITIVE Sensitive     IMIPENEM <=0.25 SENSITIVE Sensitive     TRIMETH/SULFA <=20 SENSITIVE Sensitive     AMPICILLIN /SULBACTAM >=32 RESISTANT Resistant     PIP/TAZO <=4 SENSITIVE Sensitive ug/mL    * ESCHERICHIA COLI    ESCHERICHIA COLI  Blood Culture ID Panel (Reflexed)   Collection Time: 01/26/24 10:30 PM  Result Value Ref Range   Enterococcus faecalis NOT DETECTED NOT DETECTED   Enterococcus Faecium NOT DETECTED NOT DETECTED   Listeria monocytogenes NOT DETECTED NOT DETECTED   Staphylococcus species NOT DETECTED NOT DETECTED   Staphylococcus aureus (BCID) NOT DETECTED NOT DETECTED   Staphylococcus epidermidis NOT DETECTED NOT DETECTED   Staphylococcus lugdunensis NOT DETECTED NOT DETECTED   Streptococcus species NOT DETECTED NOT DETECTED    Streptococcus agalactiae NOT DETECTED NOT DETECTED   Streptococcus pneumoniae NOT DETECTED NOT DETECTED   Streptococcus pyogenes NOT DETECTED NOT DETECTED   A.calcoaceticus-baumannii NOT DETECTED NOT DETECTED   Bacteroides fragilis NOT DETECTED NOT DETECTED   Enterobacterales DETECTED (A) NOT DETECTED   Enterobacter cloacae complex NOT DETECTED NOT DETECTED   Escherichia coli DETECTED (A) NOT DETECTED   Klebsiella aerogenes NOT DETECTED NOT DETECTED   Klebsiella oxytoca NOT DETECTED NOT DETECTED   Klebsiella pneumoniae NOT DETECTED NOT DETECTED   Proteus species NOT DETECTED NOT DETECTED   Salmonella species NOT DETECTED NOT DETECTED   Serratia marcescens NOT DETECTED NOT DETECTED   Haemophilus influenzae NOT DETECTED NOT DETECTED   Neisseria meningitidis NOT DETECTED NOT DETECTED   Pseudomonas aeruginosa NOT DETECTED NOT DETECTED   Stenotrophomonas maltophilia NOT DETECTED NOT DETECTED   Candida albicans NOT DETECTED NOT DETECTED   Candida auris NOT DETECTED NOT DETECTED   Candida glabrata NOT DETECTED NOT DETECTED   Candida krusei NOT DETECTED NOT DETECTED   Candida parapsilosis NOT DETECTED NOT DETECTED   Candida tropicalis NOT DETECTED NOT DETECTED   Cryptococcus neoformans/gattii NOT DETECTED NOT DETECTED   CTX-M ESBL NOT DETECTED NOT DETECTED   Carbapenem resistance IMP NOT DETECTED NOT DETECTED   Carbapenem resistance KPC NOT DETECTED NOT DETECTED   Carbapenem resistance NDM NOT DETECTED NOT DETECTED   Carbapenem resist OXA 48 LIKE NOT DETECTED NOT DETECTED   Carbapenem resistance VIM NOT DETECTED NOT DETECTED  Culture, blood (Routine X 2) w Reflex to ID Panel   Collection Time: 01/26/24 10:31 PM   Specimen: BLOOD  Result Value Ref Range   Specimen Description      BLOOD RIGHT ANTECUBITAL Performed at [facility], [address], [place], [place] [address]    Special Requests      BOTTLES DRAWN AEROBIC AND ANAEROBIC Blood Culture adequate  volume Performed at [facility], [address] [place], [place] [address]    Culture  Setup Time      GRAM NEGATIVE RODS IN BOTH AEROBIC AND ANAEROBIC BOTTLES CRITICAL VALUE NOTED.  VALUE IS CONSISTENT WITH PREVIOUSLY REPORTED AND CALLED VALUE.    Culture (A)     ESCHERICHIA COLI SUSCEPTIBILITIES PERFORMED ON PREVIOUS CULTURE WITHIN THE LAST 5 DAYS. Performed at [facility], [address] [place], [place] [address]    Report Status 01/29/2024 FINAL   Lactic acid, plasma   Collection Time: 01/26/24 10:31 PM  Result Value Ref Range   Lactic Acid, Venous 2.1 (HH) 0.5 - 1.9 mmol/L  Lactic acid, plasma   Collection Time: 01/27/24 12:42 AM  Result Value Ref Range   Lactic Acid, Venous 1.8 0.5 - 1.9 mmol/L  CBC  Collection Time: 01/27/24 12:50 PM  Result Value Ref Range   WBC 18.2 (H) 4.0 - 10.5 K/uL   RBC 3.99 (L) 4.22 - 5.81 MIL/uL   Hemoglobin 12.1 (L) 13.0 - 17.0 g/dL   HCT 64.1 (L) 60.9 - 47.9 %   MCV 89.7 80.0 - 100.0 fL   MCH 30.3 26.0 - 34.0 pg   MCHC 33.8 30.0 - 36.0 g/dL   RDW 86.8 88.4 - 84.4 %   Platelets 156 150 - 400 K/uL   nRBC 0.0 0.0 - 0.2 %  Comprehensive metabolic panel   Collection Time: 01/27/24 12:50 PM  Result Value Ref Range   Sodium 135 135 - 145 mmol/L   Potassium 3.1 (L) 3.5 - 5.1 mmol/L   Chloride 98 98 - 111 mmol/L   CO2 25 22 - 32 mmol/L   Glucose, Bld 117 (H) 70 - 99 mg/dL   BUN 23 8 - 23 mg/dL   Creatinine, Ser 8.95 0.61 - 1.24 mg/dL   Calcium 8.0 (L) 8.9 - 10.3 mg/dL   Total Protein 6.1 (L) 6.5 - 8.1 g/dL   Albumin 2.4 (L) 3.5 - 5.0 g/dL   AST 60 (H) 15 - 41 U/L   ALT 72 (H) 0 - 44 U/L   Alkaline Phosphatase 139 (H) 38 - 126 U/L   Total Bilirubin 0.7 0.0 - 1.2 mg/dL   GFR, Estimated >39 >39 mL/min   Anion gap 12 5 - 15  Magnesium   Collection Time: 01/27/24 12:50 PM  Result Value Ref Range   Magnesium 2.1 1.7 - 2.4 mg/dL  Phosphorus   Collection Time: 01/27/24 12:50 PM  Result Value Ref Range    Phosphorus 2.6 2.5 - 4.6 mg/dL  HIV Antibody (routine testing w rflx)   Collection Time: 01/27/24 12:50 PM  Result Value Ref Range   HIV Screen 4th Generation wRfx Non Reactive Non Reactive  Glucose, capillary   Collection Time: 01/27/24  1:03 PM  Result Value Ref Range   Glucose-Capillary 114 (H) 70 - 99 mg/dL  Glucose, capillary   Collection Time: 01/27/24  3:59 PM  Result Value Ref Range   Glucose-Capillary 122 (H) 70 - 99 mg/dL  Glucose, capillary   Collection Time: 01/27/24  9:06 PM  Result Value Ref Range   Glucose-Capillary 116 (H) 70 - 99 mg/dL  CBC   Collection Time: 01/28/24  4:51 AM  Result Value Ref Range   WBC 12.6 (H) 4.0 - 10.5 K/uL   RBC 4.03 (L) 4.22 - 5.81 MIL/uL   Hemoglobin 12.3 (L) 13.0 - 17.0 g/dL   HCT 63.5 (L) 60.9 - 47.9 %   MCV 90.3 80.0 - 100.0 fL   MCH 30.5 26.0 - 34.0 pg   MCHC 33.8 30.0 - 36.0 g/dL   RDW 86.9 88.4 - 84.4 %   Platelets 143 (L) 150 - 400 K/uL   nRBC 0.0 0.0 - 0.2 %  Comprehensive metabolic panel   Collection Time: 01/28/24  4:51 AM  Result Value Ref Range   Sodium 134 (L) 135 - 145 mmol/L   Potassium 3.0 (L) 3.5 - 5.1 mmol/L   Chloride 100 98 - 111 mmol/L   CO2 23 22 - 32 mmol/L   Glucose, Bld 114 (H) 70 - 99 mg/dL   BUN 21 8 - 23 mg/dL   Creatinine, Ser 8.90 0.61 - 1.24 mg/dL   Calcium 7.9 (L) 8.9 - 10.3 mg/dL   Total Protein 6.2 (L) 6.5 - 8.1 g/dL   Albumin  2.4 (L) 3.5 - 5.0 g/dL   AST 45 (H) 15 - 41 U/L   ALT 61 (H) 0 - 44 U/L   Alkaline Phosphatase 129 (H) 38 - 126 U/L   Total Bilirubin 0.7 0.0 - 1.2 mg/dL   GFR, Estimated >39 >39 mL/min   Anion gap 11 5 - 15  Glucose, capillary   Collection Time: 01/28/24  7:21 AM  Result Value Ref Range   Glucose-Capillary 103 (H) 70 - 99 mg/dL  Glucose, capillary   Collection Time: 01/28/24 11:26 AM  Result Value Ref Range   Glucose-Capillary 104 (H) 70 - 99 mg/dL  Glucose, capillary   Collection Time: 01/28/24  4:06 PM  Result Value Ref Range   Glucose-Capillary 154 (H)  70 - 99 mg/dL  Glucose, capillary   Collection Time: 01/28/24  9:34 PM  Result Value Ref Range   Glucose-Capillary 92 70 - 99 mg/dL  Hepatitis panel, acute   Collection Time: 01/29/24  4:14 AM  Result Value Ref Range   Hepatitis B Surface Ag NON REACTIVE NON REACTIVE   HCV Ab NON REACTIVE NON REACTIVE   Hep A IgM NON REACTIVE NON REACTIVE   Hep B C IgM NON REACTIVE NON REACTIVE  CBC with Differential/Platelet   Collection Time: 01/29/24  4:14 AM  Result Value Ref Range   WBC 8.2 4.0 - 10.5 K/uL   RBC 3.97 (L) 4.22 - 5.81 MIL/uL   Hemoglobin 12.1 (L) 13.0 - 17.0 g/dL   HCT 63.6 (L) 60.9 - 47.9 %   MCV 91.4 80.0 - 100.0 fL   MCH 30.5 26.0 - 34.0 pg   MCHC 33.3 30.0 - 36.0 g/dL   RDW 86.7 88.4 - 84.4 %   Platelets 154 150 - 400 K/uL   nRBC 0.0 0.0 - 0.2 %   Neutrophils Relative % 67 %   Neutro Abs 5.5 1.7 - 7.7 K/uL   Lymphocytes Relative 20 %   Lymphs Abs 1.6 0.7 - 4.0 K/uL   Monocytes Relative 10 %   Monocytes Absolute 0.9 0.1 - 1.0 K/uL   Eosinophils Relative 2 %   Eosinophils Absolute 0.1 0.0 - 0.5 K/uL   Basophils Relative 0 %   Basophils Absolute 0.0 0.0 - 0.1 K/uL   Immature Granulocytes 1 %   Abs Immature Granulocytes 0.08 (H) 0.00 - 0.07 K/uL  Glucose, capillary   Collection Time: 01/29/24  6:32 AM  Result Value Ref Range   Glucose-Capillary 96 70 - 99 mg/dL  Glucose, capillary   Collection Time: 01/29/24  7:18 AM  Result Value Ref Range   Glucose-Capillary 107 (H) 70 - 99 mg/dL  GC/Chlamydia probe amp ([place])not at [facility]   Collection Time: 01/29/24  8:16 AM  Result Value Ref Range   Neisseria Gonorrhea Negative    Chlamydia Negative    Comment Normal Reference [place] Chlamydia - Negative    Comment      Normal Reference Range Neisseria Gonorrhea - Negative  Comprehensive metabolic panel with GFR   Collection Time: 01/29/24  9:01 AM  Result Value Ref Range   Sodium 135 135 - 145 mmol/L   Potassium 3.5 3.5 - 5.1 mmol/L   Chloride 104 98 - 111 mmol/L    CO2 22 22 - 32 mmol/L   Glucose, Bld 131 (H) 70 - 99 mg/dL   BUN 16 8 - 23 mg/dL   Creatinine, Ser 9.65 (L) 0.61 - 1.24 mg/dL   Calcium 8.0 (L) 8.9 - 10.3 mg/dL  Total Protein 6.3 (L) 6.5 - 8.1 g/dL   Albumin 2.5 (L) 3.5 - 5.0 g/dL   AST 35 15 - 41 U/L   ALT 54 (H) 0 - 44 U/L   Alkaline Phosphatase 119 38 - 126 U/L   Total Bilirubin 0.4 0.0 - 1.2 mg/dL   GFR, Estimated >39 >39 mL/min   Anion gap 9 5 - 15  Glucose, capillary   Collection Time: 01/29/24 11:01 AM  Result Value Ref Range   Glucose-Capillary 119 (H) 70 - 99 mg/dL  Glucose, capillary   Collection Time: 01/29/24  3:43 PM  Result Value Ref Range   Glucose-Capillary 91 70 - 99 mg/dL  Glucose, capillary   Collection Time: 01/29/24  9:39 PM  Result Value Ref Range   Glucose-Capillary 131 (H) 70 - 99 mg/dL  Glucose, capillary   Collection Time: 01/30/24  6:55 AM  Result Value Ref Range   Glucose-Capillary 106 (H) 70 - 99 mg/dL  Glucose, capillary   Collection Time: 01/30/24  7:26 AM  Result Value Ref Range   Glucose-Capillary 110 (H) 70 - 99 mg/dL  Glucose, capillary   Collection Time: 01/30/24 11:44 AM  Result Value Ref Range   Glucose-Capillary 108 (H) 70 - 99 mg/dL    Assessment and Plan  E-coli UTI Assessment & Plan: Acute, complete full Levaquin  course.  Patient tolerating antibiotics well.  Continue pushing fluids.  Return and ER precautions provided   Hypokalemia  Hypertension associated with diabetes [facility]) Assessment & Plan: Chronic, now continues to be well-controlled only on Coreg  twice daily.  He continues to hold valsartan , amlodipine  and spironolactone .  We discussed if blood pressure starts trending up above 1 40-50/90 he will restart valsartan .  If he has more of a's peripheral swelling issue he can use spironolactone  daily to as needed.   Acute epididymo-orchitis Assessment & Plan: Acute, resolved status post antibiotics.  Orders: -     CBC with Differential/Platelet  Sepsis due  to Escherichia coli, unspecified whether acute organ dysfunction present [facility]) Assessment & Plan: Resolved status post antibiotics.   AKI (acute kidney injury) (HCC) Assessment & Plan: Due for reevaluation  Orders: -     Comprehensive metabolic panel with GFR  Elevated LFTs Assessment & Plan: Due for reevaluation  Orders: -     Comprehensive metabolic panel with GFR    No follow-ups on file.   Greig Ring, MD

## 2024-02-06 NOTE — Assessment & Plan Note (Signed)
Resolved status post antibiotics. 

## 2024-02-06 NOTE — Assessment & Plan Note (Addendum)
 Due for reevaluation

## 2024-02-06 NOTE — Assessment & Plan Note (Signed)
 Due for reevaluation

## 2024-02-06 NOTE — Telephone Encounter (Signed)
 Copied from CRM [id number]. Topic: General - Other >> Feb 06, 2024  3:22 PM Burnard DEL wrote: Reason for CRM: [facility] called over to verify fax that was faxed over on 02/01/2024 for patient CPAP supplies.

## 2024-02-07 ENCOUNTER — Other Ambulatory Visit: Payer: Self-pay | Admitting: Family Medicine

## 2024-02-07 ENCOUNTER — Ambulatory Visit: Payer: Self-pay | Admitting: Family Medicine

## 2024-02-07 DIAGNOSIS — R7989 Other specified abnormal findings of blood chemistry: Secondary | ICD-10-CM

## 2024-02-07 LAB — CBC WITH DIFFERENTIAL/PLATELET
Basophils Absolute: 0.1 K/uL (ref 0.0–0.1)
Basophils Relative: 1.1 % (ref 0.0–3.0)
Eosinophils Absolute: 0.1 K/uL (ref 0.0–0.7)
Eosinophils Relative: 0.9 % (ref 0.0–5.0)
HCT: 39.2 % (ref 39.0–52.0)
Hemoglobin: 13.1 g/dL (ref 13.0–17.0)
Lymphocytes Relative: 23.6 % (ref 12.0–46.0)
Lymphs Abs: 2 K/uL (ref 0.7–4.0)
MCHC: 33.3 g/dL (ref 30.0–36.0)
MCV: 89.4 fl (ref 78.0–100.0)
Monocytes Absolute: 0.8 K/uL (ref 0.1–1.0)
Monocytes Relative: 9.4 % (ref 3.0–12.0)
Neutro Abs: 5.4 K/uL (ref 1.4–7.7)
Neutrophils Relative %: 65 % (ref 43.0–77.0)
Platelets: 326 K/uL (ref 150.0–400.0)
RBC: 4.39 Mil/uL (ref 4.22–5.81)
RDW: 13.6 % (ref 11.5–15.5)
WBC: 8.3 K/uL (ref 4.0–10.5)

## 2024-02-07 LAB — COMPREHENSIVE METABOLIC PANEL WITH GFR
ALT: 62 U/L — ABNORMAL HIGH (ref 0–53)
AST: 60 U/L — ABNORMAL HIGH (ref 0–37)
Albumin: 3.4 g/dL — ABNORMAL LOW (ref 3.5–5.2)
Alkaline Phosphatase: 110 U/L (ref 39–117)
BUN: 19 mg/dL (ref 6–23)
CO2: 26 meq/L (ref 19–32)
Calcium: 8.7 mg/dL (ref 8.4–10.5)
Chloride: 103 meq/L (ref 96–112)
Creatinine, Ser: 1.15 mg/dL (ref 0.40–1.50)
GFR: 65.93 mL/min (ref >60.00)
Glucose, Bld: 93 mg/dL (ref 70–99)
Potassium: 4.4 meq/L (ref 3.5–5.1)
Sodium: 136 meq/L (ref 135–145)
Total Bilirubin: 0.3 mg/dL (ref 0.2–1.2)
Total Protein: 6.8 g/dL (ref 6.0–8.3)

## 2024-02-07 NOTE — Telephone Encounter (Signed)
 Completed form with last sleep study and last office note faxed to [facility] at  [phone].

## 2024-02-19 ENCOUNTER — Other Ambulatory Visit (INDEPENDENT_AMBULATORY_CARE_PROVIDER_SITE_OTHER)

## 2024-02-19 DIAGNOSIS — R7401 Elevation of levels of liver transaminase levels: Secondary | ICD-10-CM | POA: Diagnosis not present

## 2024-02-19 DIAGNOSIS — R7989 Other specified abnormal findings of blood chemistry: Secondary | ICD-10-CM | POA: Diagnosis not present

## 2024-02-19 LAB — HEPATIC FUNCTION PANEL
ALT: 29 U/L (ref 0–53)
AST: 27 U/L (ref 0–37)
Albumin: 3.4 g/dL — ABNORMAL LOW (ref 3.5–5.2)
Alkaline Phosphatase: 88 U/L (ref 39–117)
Bilirubin, Direct: 0.1 mg/dL (ref 0.0–0.3)
Total Bilirubin: 0.4 mg/dL (ref 0.2–1.2)
Total Protein: 6.3 g/dL (ref 6.0–8.3)

## 2024-02-20 ENCOUNTER — Ambulatory Visit: Payer: Self-pay | Admitting: Family Medicine

## 2024-02-20 LAB — HEPATITIS PANEL, ACUTE
Hep A IgM: NONREACTIVE
Hep B C IgM: NONREACTIVE
Hepatitis B Surface Ag: NONREACTIVE
Hepatitis C Ab: NONREACTIVE

## 2024-02-28 ENCOUNTER — Ambulatory Visit: Admitting: Family Medicine

## 2024-03-04 ENCOUNTER — Encounter: Payer: Self-pay | Admitting: Family Medicine

## 2024-04-04 NOTE — Progress Notes (Signed)
 Remote pacemaker transmission.

## 2024-04-08 ENCOUNTER — Other Ambulatory Visit: Payer: Self-pay | Admitting: Family Medicine

## 2024-04-08 DIAGNOSIS — F3342 Major depressive disorder, recurrent, in full remission: Secondary | ICD-10-CM

## 2024-04-26 ENCOUNTER — Ambulatory Visit: Payer: Medicare Other

## 2024-04-26 DIAGNOSIS — I442 Atrioventricular block, complete: Secondary | ICD-10-CM

## 2024-04-26 LAB — CUP PACEART REMOTE DEVICE CHECK
Battery Remaining Longevity: 84 mo
Battery Remaining Percentage: 77 %
Battery Voltage: 2.99 V
Brady Statistic AP VP Percent: 22 %
Brady Statistic AP VS Percent: 1 %
Brady Statistic AS VP Percent: 73 %
Brady Statistic AS VS Percent: 1.5 %
Brady Statistic RA Percent Paced: 19 %
Brady Statistic RV Percent Paced: 96 %
Date Time Interrogation Session: 20250926020013
Implantable Lead Connection Status: 753985
Implantable Lead Connection Status: 753985
Implantable Lead Implant Date: 20231229
Implantable Lead Implant Date: 20231229
Implantable Lead Location: 753859
Implantable Lead Location: 753860
Implantable Pulse Generator Implant Date: 20231229
Lead Channel Impedance Value: 460 Ohm
Lead Channel Impedance Value: 510 Ohm
Lead Channel Pacing Threshold Amplitude: 0.75 V
Lead Channel Pacing Threshold Amplitude: 1.25 V
Lead Channel Pacing Threshold Pulse Width: 0.4 ms
Lead Channel Pacing Threshold Pulse Width: 0.4 ms
Lead Channel Sensing Intrinsic Amplitude: 12 mV
Lead Channel Sensing Intrinsic Amplitude: 2.4 mV
Lead Channel Setting Pacing Amplitude: 2 V
Lead Channel Setting Pacing Amplitude: 2.5 V
Lead Channel Setting Pacing Pulse Width: 0.4 ms
Lead Channel Setting Sensing Sensitivity: 4 mV
Pulse Gen Model: 2272
Pulse Gen Serial Number: 8139537

## 2024-04-28 ENCOUNTER — Ambulatory Visit: Payer: Self-pay | Admitting: Internal Medicine

## 2024-05-01 NOTE — Progress Notes (Signed)
 Remote PPM Transmission

## 2024-05-21 NOTE — Progress Notes (Signed)
 NATHON STEFANSKI                                          MRN: [id number]   05/21/2024   The VBCI Quality Team Specialist reviewed this patient medical record for the purposes of chart review for care gap closure. The following were reviewed: chart review for care gap closure-kidney health evaluation for diabetes:eGFR  and uACR.    VBCI Quality Team

## 2024-05-21 NOTE — Progress Notes (Signed)
 Blake Garcia                                          MRN: [id number]   05/21/2024   The VBCI Quality Team Specialist reviewed this patient medical record for the purposes of chart review for care gap closure. The following were reviewed: abstraction for care gap closure-glycemic status assessment.    VBCI Quality Team

## 2024-06-05 LAB — MICROALBUMIN / CREATININE URINE RATIO: Microalb Creat Ratio: <30

## 2024-06-12 ENCOUNTER — Encounter: Payer: Self-pay | Admitting: Family Medicine

## 2024-06-18 ENCOUNTER — Other Ambulatory Visit

## 2024-06-18 ENCOUNTER — Ambulatory Visit: Payer: Self-pay | Admitting: Family Medicine

## 2024-06-18 DIAGNOSIS — Z9884 Bariatric surgery status: Secondary | ICD-10-CM

## 2024-06-18 DIAGNOSIS — E559 Vitamin D deficiency, unspecified: Secondary | ICD-10-CM | POA: Diagnosis not present

## 2024-06-18 LAB — IBC + FERRITIN
Ferritin: 24.3 ng/mL (ref 22.0–322.0)
Iron: 53 ug/dL (ref 42–165)
Saturation Ratios: 11.2 % — ABNORMAL LOW (ref 20.0–50.0)
TIBC: 474.6 ug/dL — ABNORMAL HIGH (ref 250.0–450.0)
Transferrin: 339 mg/dL (ref 212.0–360.0)

## 2024-06-18 LAB — CBC WITH DIFFERENTIAL/PLATELET
Basophils Absolute: 0 K/uL (ref 0.0–0.1)
Basophils Relative: 0.5 % (ref 0.0–3.0)
Eosinophils Absolute: 0.2 K/uL (ref 0.0–0.7)
Eosinophils Relative: 2.4 % (ref 0.0–5.0)
HCT: 42.3 % (ref 39.0–52.0)
Hemoglobin: 14.1 g/dL (ref 13.0–17.0)
Lymphocytes Relative: 23.5 % (ref 12.0–46.0)
Lymphs Abs: 1.7 K/uL (ref 0.7–4.0)
MCHC: 33.4 g/dL (ref 30.0–36.0)
MCV: 90.8 fl (ref 78.0–100.0)
Monocytes Absolute: 0.8 K/uL (ref 0.1–1.0)
Monocytes Relative: 10.3 % (ref 3.0–12.0)
Neutro Abs: 4.6 K/uL (ref 1.4–7.7)
Neutrophils Relative %: 63.3 % (ref 43.0–77.0)
Platelets: 213 K/uL (ref 150.0–400.0)
RBC: 4.66 Mil/uL (ref 4.22–5.81)
RDW: 14.3 % (ref 11.5–15.5)
WBC: 7.3 K/uL (ref 4.0–10.5)

## 2024-06-18 LAB — VITAMIN B12: Vitamin B-12: 1245 pg/mL — ABNORMAL HIGH (ref 211–911)

## 2024-06-18 LAB — VITAMIN D 25 HYDROXY (VIT D DEFICIENCY, FRACTURES): VITD: 21.68 ng/mL — ABNORMAL LOW (ref 30.00–100.00)

## 2024-06-18 NOTE — Progress Notes (Signed)
 No critical labs need to be addressed urgently. We will discuss labs in detail at upcoming office visit.

## 2024-06-25 ENCOUNTER — Ambulatory Visit: Admitting: Family Medicine

## 2024-07-02 ENCOUNTER — Ambulatory Visit: Admitting: Family Medicine

## 2024-07-02 VITALS — BP 154/88 | HR 62 | Temp 97.5°F | Ht 67.0 in | Wt 299.5 lb

## 2024-07-02 DIAGNOSIS — E1159 Type 2 diabetes mellitus with other circulatory complications: Secondary | ICD-10-CM

## 2024-07-02 DIAGNOSIS — Z7985 Long-term (current) use of injectable non-insulin antidiabetic drugs: Secondary | ICD-10-CM

## 2024-07-02 DIAGNOSIS — E559 Vitamin D deficiency, unspecified: Secondary | ICD-10-CM

## 2024-07-02 DIAGNOSIS — E118 Type 2 diabetes mellitus with unspecified complications: Secondary | ICD-10-CM

## 2024-07-02 DIAGNOSIS — E66813 Obesity, class 3: Secondary | ICD-10-CM | POA: Diagnosis not present

## 2024-07-02 DIAGNOSIS — I152 Hypertension secondary to endocrine disorders: Secondary | ICD-10-CM

## 2024-07-02 LAB — POCT GLYCOSYLATED HEMOGLOBIN (HGB A1C): Hemoglobin A1C: 5.2 % (ref 4.0–5.6)

## 2024-07-02 MED ORDER — VITAMIN D (ERGOCALCIFEROL) 1.25 MG (50000 UNIT) PO CAPS: 50000.0000 [IU] | ORAL_CAPSULE | ORAL | 1 refills | Status: AC

## 2024-07-02 MED ORDER — OZEMPIC (0.25 OR 0.5 MG/DOSE) 2 MG/3ML ~~LOC~~ SOPN: 0.2500 mg | PEN_INJECTOR | SUBCUTANEOUS | 5 refills | Status: DC

## 2024-07-02 MED ORDER — VALSARTAN 40 MG PO TABS: 40.0000 mg | ORAL_TABLET | Freq: Every day | ORAL | 3 refills | Status: AC

## 2024-07-02 NOTE — Progress Notes (Signed)
 Patient ID: Blake Garcia, male    DOB: March 29, 1956, 68 y.o.   MRN: [id number]  This visit was conducted in person.  BP (!) 154/88   Pulse 62   Temp (!) 97.5 F (36.4 C) (Temporal)   Ht 5' 7 (1.702 m)   Wt 299 lb 8 oz (135.9 kg)   SpO2 96%   BMI 46.91 kg/m    CC:  Chief Complaint  Patient presents with   Diabetes    Subjective:   HPI: Blake Garcia is a 68 y.o. male presenting on 07/02/2024 for welcome to  Diabetes    He has lost > 200 lbs with bariatric surgery, but has gained some back in the last 12 months Wt Readings from Last 3 Encounters:  07/02/24 299 lb 8 oz (135.9 kg)  02/06/24 291 lb 6 oz (132.2 kg)  01/26/24 280 lb (127 kg)  Body mass index is 46.91 kg/m.   Diabetes:   resolved s/p bariatric surgery, on no medication. Weight gain is returning. Lab Results  Component Value Date   HGBA1C 5.2 07/02/2024  Using medications without difficulties: Hypoglycemic episodes: Hyperglycemic episodes: Feet problems:none Blood Sugars averaging: eye exam within last year:yes   Hypertension:  Inadequate control today in office on coreg  12.5 mg BID BP Readings from Last 3 Encounters:  07/02/24 (!) 154/88  02/06/24 110/72  01/30/24 132/78  Using medication without problems or lightheadedness:  Chest pain with exertion: Edema: Short of breath: Average home BPs: At home BP has been running 140-15/80-93 Other issues: CHF followed by cardiology.   MDD: well controlled on effexor  75 mg 3 tabs daily.SABRA splits dose for absorption. Flowsheet Row Office Visit from 07/02/2024 in [facility]  PHQ-2 Total Score 2    Vit D low.. taking bariatric med.  Relevant past medical, surgical, family and social history reviewed and updated as indicated. Interim medical history since our last visit reviewed. Allergies and medications reviewed and updated. Outpatient Medications Prior to Visit  Medication Sig Dispense Refill   Calcium  Carb-Cholecalciferol (CALCIUM 600 + D PO) Take 1 tablet by mouth in the morning, at noon, and at bedtime.     carvedilol  (COREG ) 12.5 MG tablet TAKE 1 TABLET BY MOUTH TWICE  DAILY WITH MEALS 180 tablet 3   Multiple Vitamins-Minerals (BARIATRIC MULTIVITAMINS/IRON) CAPS Take 1 capsule by mouth daily.     Nutritional Supplements (NUTRITIONAL SUPPLEMENT PO) Take 1 tablet by mouth in the morning and at bedtime. Healthy Aging     potassium chloride  SA (KLOR-CON  M) 20 MEQ tablet TAKE 1 TABLET BY MOUTH DAILY 100 tablet 2   simvastatin  (ZOCOR ) 20 MG tablet TAKE 1 TABLET BY MOUTH AT  BEDTIME 100 tablet 2   triamcinolone  cream (KENALOG ) 0.1 % APPLY TO AFFECTED AREAS TWICE A DAY IF NEEDED 454 g 0   venlafaxine  XR (EFFEXOR -XR) 75 MG 24 hr capsule TAKE 3 CAPSULES BY MOUTH ONCE  DAILY 270 capsule 3   amLODipine  (NORVASC ) 10 MG tablet Take 10 mg by mouth daily. (Patient not taking: Reported on 02/06/2024)     spironolactone  (ALDACTONE ) 25 MG tablet TAKE 1 TABLET BY MOUTH DAILY (Patient not taking: Reported on 02/06/2024) 90 tablet 3   valsartan  (DIOVAN ) 320 MG tablet Take 1 tablet (320 mg total) by mouth daily. (Patient not taking: Reported on 02/06/2024) 90 tablet 3   No facility-administered medications prior to visit.     Per HPI unless specifically indicated in ROS section below  Review of Systems  Constitutional:  Negative for fatigue and fever.  HENT:  Negative for ear pain.   Eyes:  Negative for pain.  Respiratory:  Negative for cough and shortness of breath.   Cardiovascular:  Negative for chest pain, palpitations and leg swelling.  Gastrointestinal:  Negative for abdominal pain.  Genitourinary:  Negative for dysuria.  Musculoskeletal:  Negative for arthralgias.  Neurological:  Negative for syncope, light-headedness and headaches.  Psychiatric/Behavioral:  Negative for dysphoric mood.    Objective:  BP (!) 154/88   Pulse 62   Temp (!) 97.5 F (36.4 C) (Temporal)   Ht 5' 7 (1.702 m)   Wt 299 lb 8  oz (135.9 kg)   SpO2 96%   BMI 46.91 kg/m   Wt Readings from Last 3 Encounters:  07/02/24 299 lb 8 oz (135.9 kg)  02/06/24 291 lb 6 oz (132.2 kg)  01/26/24 280 lb (127 kg)      Physical Exam Constitutional:      General: He is not in acute distress.    Appearance: Normal appearance. He is well-developed. He is obese. He is not ill-appearing or toxic-appearing.  HENT:     Head: Normocephalic and atraumatic.     Right Ear: Hearing, tympanic membrane, ear canal and external ear normal.     Left Ear: Hearing, tympanic membrane, ear canal and external ear normal.     Nose: Nose normal.     Mouth/Throat:     Pharynx: Uvula midline.  Eyes:     General: Lids are normal. Lids are everted, no foreign bodies appreciated.     Conjunctiva/sclera: Conjunctivae normal.     Pupils: Pupils are equal, round, and reactive to light.  Neck:     Thyroid : No thyroid  mass or thyromegaly.     Vascular: No carotid bruit.     Trachea: Trachea and phonation normal.  Cardiovascular:     Rate and Rhythm: Normal rate and regular rhythm.     Pulses: Normal pulses.     Heart sounds: S1 normal and S2 normal. No murmur heard.    No gallop.  Pulmonary:     Breath sounds: Normal breath sounds. No wheezing, rhonchi or rales.  Abdominal:     General: Bowel sounds are normal.     Palpations: Abdomen is soft.     Tenderness: There is no abdominal tenderness. There is no guarding or rebound.     Hernia: No hernia is present.  Musculoskeletal:     Cervical back: Normal range of motion and neck supple.  Lymphadenopathy:     Cervical: No cervical adenopathy.  Skin:    General: Skin is warm and dry.     Findings: No rash.  Neurological:     Mental Status: He is alert.     Cranial Nerves: No cranial nerve deficit.     Sensory: No sensory deficit.     Gait: Gait normal.     Deep Tendon Reflexes: Reflexes are normal and symmetric.  Psychiatric:        Speech: Speech normal.        Behavior: Behavior normal.         Judgment: Judgment normal.      Diabetic foot exam: Normal inspection No skin breakdown Small great toe calluses  Normal DP pulses Normal sensation to light touch and monofilament Nails thickened at great toes  Results for orders placed or performed in visit on 07/02/24  POCT glycosylated hemoglobin (Hb A1C)   Collection Time:  07/02/24  9:20 AM  Result Value Ref Range   Hemoglobin A1C 5.2 4.0 - 5.6 %   HbA1c POC (<> result, manual entry)     HbA1c, POC (prediabetic range)     HbA1c, POC (controlled diabetic range)       COVID 19 screen:  No recent travel or known exposure to COVID19 The patient denies respiratory symptoms of COVID 19 at this time. The importance of social distancing was discussed today.   Assessment and Plan   The patient's preventative maintenance and recommended screening tests for an annual wellness exam were reviewed in full today. Brought up to date unless services declined.  Counselled on the importance of diet, exercise, and its role in overall health and mortality. The patient's FH and SH was reviewed, including their home life, tobacco status, and drug and alcohol status.   Vaccines:  uptodate shingrix, Td 2027, prevnar 20  Prostate Cancer Screen:   dad with pro cancer.  PSA 2.9, 6 month ago 3.6, 2024 2.15, 2023 1.77... trending up, but high percent free Colon Cancer Screen:  colonoscopy  07/2020 recall in 7 years      Smoking Status:none ETOH/ drug ldz:wnwz/wnwz  Hep C: done  HIV screen:   done  Problem List Items Addressed This Visit     Controlled type 2 diabetes mellitus with complication, without long-term current use of insulin (HCC) - Primary (Chronic)   Chronic, status post bariatric surgery.  Stable control... weight increasing back up and BP increasing with weight gain.  In past he has tolerated Trulicity ... will start Ozempic 0.25 mg weekly x 2-4 weeks with planned increase to 0.5 mg weekly. Re-eval A1C in 3 months.         Relevant Medications   Semaglutide,0.25 or 0.5MG /DOS, (OZEMPIC, 0.25 OR 0.5 MG/DOSE,) 2 MG/3ML SOPN   valsartan  (DIOVAN ) 40 MG tablet   Other Relevant Orders   POCT glycosylated hemoglobin (Hb A1C) (Completed)   Hypertension associated with diabetes (HCC) (Chronic)   Chronic,  worsened control on only Coreg  twice daily.    Work on medical illustrator and add back valsartan  low dose at 40 mg daily      Relevant Medications   Semaglutide,0.25 or 0.5MG /DOS, (OZEMPIC, 0.25 OR 0.5 MG/DOSE,) 2 MG/3ML SOPN   valsartan  (DIOVAN ) 40 MG tablet   Obesity, Class III, BMI 40-49.9 (morbid obesity) (HCC)   S/P gastric bypass Encouraged exercise, weight loss, healthy eating habits.        Relevant Medications   Semaglutide,0.25 or 0.5MG /DOS, (OZEMPIC, 0.25 OR 0.5 MG/DOSE,) 2 MG/3ML SOPN   Vitamin D  deficiency   Other Visit Diagnoses       Long-term current use of injectable noninsulin antidiabetic medication           Greig Ring, MD

## 2024-07-02 NOTE — Assessment & Plan Note (Signed)
 S/P gastric bypass Encouraged exercise, weight loss, healthy eating habits.

## 2024-07-02 NOTE — Assessment & Plan Note (Addendum)
 Chronic, status post bariatric surgery.  Stable control... weight increasing back up and BP increasing with weight gain.  In past he has tolerated Trulicity ... will start Ozempic 0.25 mg weekly x 2-4 weeks with planned increase to 0.5 mg weekly. Re-eval A1C in 3 months.

## 2024-07-02 NOTE — Assessment & Plan Note (Addendum)
 Chronic,  worsened control on only Coreg  twice daily.    Work on medical illustrator and add back valsartan  low dose at 40 mg daily

## 2024-07-08 ENCOUNTER — Encounter: Payer: Self-pay | Admitting: Family Medicine

## 2024-07-09 NOTE — Progress Notes (Signed)
 Blake Garcia                                          MRN: [id number]   07/09/2024   The VBCI Quality Team Specialist reviewed this patient medical record for the purposes of chart review for care gap closure. The following were reviewed: abstraction for care gap closure-glycemic status assessment and kidney health evaluation for diabetes:eGFR  and uACR.    VBCI Quality Team

## 2024-07-24 ENCOUNTER — Other Ambulatory Visit: Payer: Self-pay

## 2024-07-24 ENCOUNTER — Other Ambulatory Visit: Payer: Self-pay | Admitting: Family Medicine

## 2024-07-24 MED ORDER — SIMVASTATIN 20 MG PO TABS: 20.0000 mg | ORAL_TABLET | Freq: Every day | ORAL | 1 refills | Status: AC

## 2024-07-24 MED ORDER — POTASSIUM CHLORIDE CRYS ER 20 MEQ PO TBCR: 20.0000 meq | EXTENDED_RELEASE_TABLET | Freq: Every day | ORAL | 3 refills | Status: AC

## 2024-07-26 ENCOUNTER — Ambulatory Visit: Payer: Medicare Other

## 2024-07-26 DIAGNOSIS — I442 Atrioventricular block, complete: Secondary | ICD-10-CM | POA: Diagnosis not present

## 2024-07-28 LAB — CUP PACEART REMOTE DEVICE CHECK
Battery Remaining Longevity: 80 mo
Battery Remaining Percentage: 74 %
Battery Voltage: 2.99 V
Brady Statistic AP VP Percent: 21 %
Brady Statistic AP VS Percent: 1 %
Brady Statistic AS VP Percent: 75 %
Brady Statistic AS VS Percent: 1.7 %
Brady Statistic RA Percent Paced: 17 %
Brady Statistic RV Percent Paced: 95 %
Date Time Interrogation Session: 20251226020024
Implantable Lead Connection Status: 753985
Implantable Lead Connection Status: 753985
Implantable Lead Implant Date: 20231229
Implantable Lead Implant Date: 20231229
Implantable Lead Location: 753859
Implantable Lead Location: 753860
Implantable Pulse Generator Implant Date: 20231229
Lead Channel Impedance Value: 450 Ohm
Lead Channel Impedance Value: 510 Ohm
Lead Channel Pacing Threshold Amplitude: 0.75 V
Lead Channel Pacing Threshold Amplitude: 1.25 V
Lead Channel Pacing Threshold Pulse Width: 0.4 ms
Lead Channel Pacing Threshold Pulse Width: 0.4 ms
Lead Channel Sensing Intrinsic Amplitude: 12 mV
Lead Channel Sensing Intrinsic Amplitude: 2.3 mV
Lead Channel Setting Pacing Amplitude: 2 V
Lead Channel Setting Pacing Amplitude: 2.5 V
Lead Channel Setting Pacing Pulse Width: 0.4 ms
Lead Channel Setting Sensing Sensitivity: 4 mV
Pulse Gen Model: 2272
Pulse Gen Serial Number: 8139537

## 2024-07-29 ENCOUNTER — Other Ambulatory Visit: Payer: Self-pay | Admitting: Cardiovascular Disease

## 2024-07-29 ENCOUNTER — Telehealth: Payer: Self-pay

## 2024-07-29 NOTE — Telephone Encounter (Addendum)
 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  EGMS with FFOS rates/ duration always consistent with short runs of AT  HOWEVER - there is 1 noted rate/duration event on 07/09/24  that is very unusual and is more consistent with true new onset AF. Sadly there is not EGM to correlate and confirm.  CHADSVASC score of at least 4 based on hx.   Pending for provider review.    Dorise also recommends the following program adjustments:  Lower mode switch to 150 - Base Atrial events off of AMS episodes versus AF burden data due to [facility].  Extend PVAB to 160 to help with FFOS.

## 2024-07-30 ENCOUNTER — Ambulatory Visit: Payer: Self-pay | Admitting: Internal Medicine

## 2024-07-30 NOTE — Telephone Encounter (Signed)
 Agree with plan outlined below.

## 2024-07-31 NOTE — Progress Notes (Signed)
 Remote PPM Transmission

## 2024-08-02 ENCOUNTER — Ambulatory Visit: Attending: Internal Medicine

## 2024-08-02 DIAGNOSIS — I442 Atrioventricular block, complete: Secondary | ICD-10-CM

## 2024-08-02 NOTE — Progress Notes (Signed)
 Acute PPM device interrogation with programming changes made today in device clinic.  Patient has been having episodes of FFOS contributing to false mode switches and skewed atrial event data.  On 12/9 there was a new 13 hour atrial event with rates consistent with AF; however, due to storage overload, no EGM to confirm if this was new onset.    Reviewed with Blake Garcia, [employer] rep, for fine tuning opportunities to see if we can improve data collection to avoid missing any AF.  The following programming changes made, will base atrial event assessment data off of AMS episodes as burden will not provide us  accurate info.   PROGRAMMING CHANGES:  1.  Mode switch rate dropped to 150 2.  PVAB extended to .   Will continue to monitor.  Dr. Waddell aware and in agreement with plan.   Patient will be following with Dr. Kennyth moving forward.

## 2024-08-02 NOTE — Telephone Encounter (Signed)
 Patient seen in device clinic today. Programming adjustments made.

## 2024-08-02 NOTE — Telephone Encounter (Signed)
 LM to have patient call back to make device clinic appt.  Need to make the programming adjustments below so that we are accurately catching any possible AF for patient as this would be a new diagnosis for him.  Need to determine if OAC is appropriate.

## 2024-08-12 ENCOUNTER — Encounter: Payer: Self-pay | Admitting: Family Medicine

## 2024-08-12 ENCOUNTER — Other Ambulatory Visit: Payer: Self-pay | Admitting: Cardiovascular Disease

## 2024-08-12 DIAGNOSIS — E118 Type 2 diabetes mellitus with unspecified complications: Secondary | ICD-10-CM

## 2024-08-12 MED ORDER — OZEMPIC (0.25 OR 0.5 MG/DOSE) 2 MG/3ML ~~LOC~~ SOPN: 0.5000 mg | PEN_INJECTOR | SUBCUTANEOUS | 0 refills | Status: AC

## 2024-08-12 MED ORDER — CARVEDILOL 12.5 MG PO TABS: 12.5000 mg | ORAL_TABLET | Freq: Two times a day (BID) | ORAL | 1 refills | Status: AC

## 2024-10-01 ENCOUNTER — Ambulatory Visit: Admitting: Family Medicine

## 2024-10-25 ENCOUNTER — Ambulatory Visit
